# Patient Record
Sex: Female | Born: 1960 | Race: White | Hispanic: No | Marital: Single | Smoking: Never smoker
Health system: Southern US, Community
[De-identification: ages and names within clinical notes are randomized; demographics above are authoritative.]

## PROBLEM LIST (undated history)

## (undated) DIAGNOSIS — I10 Essential (primary) hypertension: Secondary | ICD-10-CM

## (undated) DIAGNOSIS — R7303 Prediabetes: Secondary | ICD-10-CM

## (undated) DIAGNOSIS — E7800 Pure hypercholesterolemia, unspecified: Secondary | ICD-10-CM

## (undated) DIAGNOSIS — F209 Schizophrenia, unspecified: Secondary | ICD-10-CM

## (undated) HISTORY — DX: Pure hypercholesterolemia, unspecified: E78.00

## (undated) HISTORY — DX: Prediabetes: R73.03

## (undated) HISTORY — DX: Schizophrenia, unspecified: F20.9

## (undated) HISTORY — DX: Essential (primary) hypertension: I10

## (undated) HISTORY — PX: TONSILLECTOMY: SUR1361

---

## 2005-03-17 DIAGNOSIS — I10 Essential (primary) hypertension: Secondary | ICD-10-CM | POA: Insufficient documentation

## 2005-03-17 DIAGNOSIS — E7800 Pure hypercholesterolemia, unspecified: Secondary | ICD-10-CM | POA: Insufficient documentation

## 2005-03-17 DIAGNOSIS — F209 Schizophrenia, unspecified: Secondary | ICD-10-CM | POA: Insufficient documentation

## 2012-06-10 ENCOUNTER — Other Ambulatory Visit: Payer: Self-pay | Admitting: Family Medicine

## 2012-06-10 LAB — CBC WITH DIFFERENTIAL/PLATELET
Eosinophil #: 0 10*3/uL (ref 0.0–0.7)
Eosinophil %: 0.4 %
Lymphocyte #: 2.8 10*3/uL (ref 1.0–3.6)
Lymphocyte %: 35.1 %
MCH: 29.3 pg (ref 26.0–34.0)
MCHC: 33.8 g/dL (ref 32.0–36.0)
Monocyte #: 0.6 x10 3/mm (ref 0.2–0.9)
Neutrophil #: 4.5 10*3/uL (ref 1.4–6.5)
Neutrophil %: 56 %
Platelet: 291 10*3/uL (ref 150–440)
RBC: 4.3 10*6/uL (ref 3.80–5.20)

## 2012-07-13 HISTORY — PX: COLONOSCOPY: SHX174

## 2018-10-12 ENCOUNTER — Encounter: Payer: Self-pay | Admitting: Internal Medicine

## 2019-01-18 ENCOUNTER — Ambulatory Visit (INDEPENDENT_AMBULATORY_CARE_PROVIDER_SITE_OTHER): Payer: Medicare Other | Admitting: Gastroenterology

## 2019-01-18 ENCOUNTER — Encounter: Payer: Self-pay | Admitting: Gastroenterology

## 2019-01-18 ENCOUNTER — Other Ambulatory Visit: Payer: Self-pay

## 2019-01-18 DIAGNOSIS — R197 Diarrhea, unspecified: Secondary | ICD-10-CM | POA: Diagnosis not present

## 2019-01-18 MED ORDER — DICYCLOMINE HCL 10 MG PO CAPS: ORAL_CAPSULE | ORAL | 1 refills | Status: DC

## 2019-01-18 NOTE — Progress Notes (Signed)
Primary Care Physician:  Lamar BlinksWard, Matthew, PA-C  Primary Gastroenterologist:  Roetta SessionsMichael Rourk, MD   Chief Complaint  Patient presents with  . Diarrhea    several months (watery)    HPI:  Valerie [name] is a 58 y.o. 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   Patient states she has had diarrhea off and on for several years.  Will have diarrhea every 4 to 5 days.  Stools soft to loose to watery.  Will have several episodes in a given day.  Associated with fecal urgency and sometimes incontinence.  Denies any abdominal pain associated with it.  No weight loss.  Appetite remains good.  No vomiting.  No melena or rectal bleeding.  Complains of nocturnal diarrhea about once per month.  She states the loperamide seems to help but does not lately control her symptoms.  She takes as needed only.  She has been on metformin for about 3 years and does not feel like it is contributing to her diarrhea.  Her last colon[place] was around [place] in [place] by Dr. Allena KatzPatel per patient.  She states she had diverticulosis.  She tells me she does not want to have another colonoscopy in the future because she is fearful of being put to sleep.   Current Outpatient Medications  Medication Sig Dispense Refill  . acetaminophen (TYLENOL) 500 MG tablet Take 500 mg by mouth every 4 (four) hours as needed.    Marland Kitchen. alum & mag hydroxide-simeth (MAALOX/MYLANTA) 200-200-20 MG/5ML suspension Take 30 mLs by mouth as needed for indigestion or heartburn.    Marland Kitchen. amLODipine (NORVASC) 5 MG tablet Take 5 mg by mouth daily.    Marland Kitchen. aspirin EC 81 MG tablet Take 81 mg by mouth daily.    Marland Kitchen. atorvastatin (LIPITOR) 20 MG tablet Take 20 mg by mouth daily.    . busPIRone (BUSPAR) 10 MG tablet Take 10 mg by mouth 2 (two) times daily.    . busPIRone (BUSPAR) 5 MG tablet Take 5 mg by mouth at bedtime.    . Cholecalciferol ([employer]S VITAMIN D3) 250 MCG (10000 UT) CAPS Take by mouth daily.     . clozapine (CLOZARIL) 200 MG tablet Take 200 mg by mouth 2 (two) times daily.    . DULoxetine (CYMBALTA) 20 MG capsule Take 20 mg by mouth daily.    . ferrous sulfate 325 (65 FE) MG tablet Take 325 mg by mouth daily with breakfast.    . Glucose-Vitamin C-Vitamin D (TRUEPLUS GLUCOSE) CHEW Chew 4 g by mouth daily as needed.    Marland Kitchen. guaifenesin (ROBITUSSIN) 100 MG/5ML syrup Take 200 mg by mouth every 6 (six) hours as needed for cough.    . hydrochlorothiazide (HYDRODIURIL) 25 MG tablet Take 25 mg by mouth daily.    Bess Harvest. Icosapent Ethyl (VASCEPA) 1 g CAPS Take 2 g by mouth 2 (two) times a day.    . lisinopril (ZESTRIL) 40 MG tablet Take 40 mg by mouth daily.    Marland Kitchen. loperamide (IMODIUM) 2 MG capsule Take 2 mg by mouth as needed for diarrhea or loose stools.    . magnesium hydroxide (CVS MILK OF MAGNESIA) 400 MG/5ML suspension Take by mouth at bedtime as needed for mild constipation.    . metFORMIN (GLUCOPHAGE) 1000 MG tablet Take 1,000 mg by mouth 2 (two) times daily with a meal.    . Multiple Vitamins-Minerals (MULTIVITAMIN) tablet Take 1 tablet by mouth daily.    .Marland Kitchen  naproxen (NAPROSYN) 500 MG tablet Take 500 mg by mouth every 12 (twelve) hours as needed.    . NEOMYCIN-BACITRACIN-POLYMYXIN EX Apply topically daily as needed.    . norethindrone-ethinyl estradiol (NECON) 0.5-35 MG-MCG tablet Take 1 tablet by mouth daily.    Marland Kitchen omeprazole (PRILOSEC) 20 MG capsule Take 20 mg by mouth daily.    . potassium chloride (KLOR-CON) 20 MEQ packet Take by mouth daily.    . propranolol (INDERAL) 40 MG tablet Take 40 mg by mouth daily.    . Skin Protectants, Misc. (EUCERIN) cream Apply topically 2 (two) times a day.    .     1   No current facility-administered medications for this visit.     Allergies as of 01/18/2019 - Review Complete 01/18/2019  Allergen Reaction Noted  . Risperidone and related  01/18/2019    Past Medical History:  Diagnosis Date  . HTN (hypertension)   . Hypercholesteremia   .  Prediabetes   . Schizophrenia [facility])     Past Surgical History:  Procedure Laterality Date  . TONSILLECTOMY        Family History  Problem Relation Age of Onset  . Colon cancer Neg Hx   . Celiac disease Neg Hx   . Inflammatory bowel disease Neg Hx     Social History   Socioeconomic History  . Marital status: Single    Spouse name: Not on file  . Number of children: Not on file  . Years of education: Not on file  . Highest education level: Not on file  Occupational History  . Not on file  Social Needs  . Financial resource strain: Not on file  . Food insecurity    Worry: Not on file    Inability: Not on file  . Transportation needs    Medical: Not on file    Non-medical: Not on file  Tobacco Use  . Smoking status: Never Smoker  . Smokeless tobacco: Never Used  Substance and Sexual Activity  . Alcohol use: Never    Frequency: Never  . Drug use: Never  . Sexual activity: Not Currently    Birth control/protection: Pill  Lifestyle  . Physical activity    Days per week: Not on file    Minutes per session: Not on file  . Stress: Not on file  Relationships  . Social Herbalist on phone: Not on file    Gets together: Not on file    Attends religious service: Not on file    Active member of club or organization: Not on file    Attends meetings of clubs or organizations: Not on file    Relationship status: Not on file  . Intimate partner violence    Fear of current or ex partner: Not on file    Emotionally abused: Not on file    Physically abused: Not on file    Forced sexual activity: Not on file  Other Topics Concern  . Not on file  Social History Narrative  . Not on file      ROS:  General: Negative for anorexia, weight loss, fever, chills, fatigue, weakness. Eyes: Negative for vision changes.  ENT: Negative for hoarseness, difficulty swallowing , nasal congestion. CV: Negative for chest pain, angina, palpitations, dyspnea on exertion,  peripheral edema.  Respiratory: Negative for dyspnea at rest, dyspnea on exertion, cough, sputum, wheezing.  GI: See history of present illness. GU:  Negative for dysuria, hematuria, urinary incontinence, urinary frequency, nocturnal  urination.  MS: Negative for joint pain, low back pain.  Derm: Negative for rash or itching.  Neuro: Negative for weakness, abnormal sensation, seizure, frequent headaches, memory loss, confusion.  Psych: Negative for anxiety, depression, suicidal ideation, hallucinations.  Endo: Negative for unusual weight change.  Heme: Negative for bruising or bleeding. Allergy: Negative for rash or hives.    Physical Examination:  BP 115/71   Pulse 88   Temp (!) 97.2 F (36.2 C) (Oral)   Ht 5\' 2"  (1.575 m)   Wt 226 lb (102.5 kg)   BMI 41.34 kg/m    General: Well-nourished, well-developed in no acute distress.  Head: Normocephalic, atraumatic.   Eyes: Conjunctiva pink, no icterus. Mouth: Oropharyngeal mucosa moist and pink , no lesions erythema or exudate. Neck: Supple without thyromegaly, masses, or lymphadenopathy.  Lungs: Clear to auscultation bilaterally.  Heart: Regular rate and rhythm, no murmurs rubs or gallops.  Abdomen: Bowel sounds are normal, nontender, nondistended, no hepatosplenomegaly or masses, no abdominal bruits or    hernia , no rebound or guarding.   Rectal: not performed Extremities: No lower extremity edema. No clubbing or deformities.  Neuro: Alert and oriented x 4 , grossly normal neurologically.  Skin: Warm and dry, no rash or jaundice.   Psych: Alert and cooperative, normal mood and affect.

## 2019-01-18 NOTE — Progress Notes (Signed)
cc'ed to pcp °

## 2019-01-18 NOTE — Patient Instructions (Addendum)
1. Please complete labs and stool tests.  2. I will review copy of colonoscopy report once received.  3. Trial of bentyl once before breakfast and once before evening meal.  4. Return to the office in 3 months.

## 2019-01-18 NOTE — Assessment & Plan Note (Signed)
Very pleasant 58 year old female with history of schizophrenia, hypertension, hyperlipidemia, prediabetes who presented with several year history of intermittent diarrhea.  Symptoms of associated with fecal urgency and sometimes incontinence.  Rarely has nocturnal symptoms.  Diarrhea occurring every 4 to 5 days and in between she has normal stools.  Symptoms may be related to medications or dietary indiscretions.  Less likely due to infectious etiology but given recent antibiotic exposure we will rule that out.  Less likely microscopic colitis, IBD, celiac disease.  Patient has made it clear that she is not interested in pursuing a colonoscopy in the future.  We will obtain some blood work and stool studies, trial of dicyclomine twice daily.  Instructions given to hold dicyclomine if she develops constipation.  Plan to see her back in 3 months for follow-up.

## 2019-03-07 ENCOUNTER — Other Ambulatory Visit: Payer: Self-pay | Admitting: Gastroenterology

## 2019-03-31 ENCOUNTER — Encounter: Payer: Self-pay | Admitting: Gastroenterology

## 2019-03-31 ENCOUNTER — Telehealth: Payer: Self-pay | Admitting: Gastroenterology

## 2019-03-31 NOTE — Telephone Encounter (Signed)
Colonoscopy at [facility] gastroenterology in 2014, Dr. Posey Pronto: Moderate pandiverticulosis, tortuous colon, adequate prep.  NIC for TCS 2024 (patient may not agree to have done)

## 2019-04-04 NOTE — Telephone Encounter (Signed)
ON RECALL  °

## 2019-04-25 ENCOUNTER — Ambulatory Visit (INDEPENDENT_AMBULATORY_CARE_PROVIDER_SITE_OTHER): Payer: Medicare Other | Admitting: Gastroenterology

## 2019-04-25 ENCOUNTER — Encounter: Payer: Self-pay | Admitting: Gastroenterology

## 2019-04-25 ENCOUNTER — Other Ambulatory Visit: Payer: Self-pay

## 2019-04-25 DIAGNOSIS — K580 Irritable bowel syndrome with diarrhea: Secondary | ICD-10-CM | POA: Diagnosis not present

## 2019-04-25 DIAGNOSIS — K589 Irritable bowel syndrome without diarrhea: Secondary | ICD-10-CM | POA: Insufficient documentation

## 2019-04-25 NOTE — Progress Notes (Signed)
Primary Care Physician: Braxton Feathers  Primary Gastroenterologist:  Garfield Cornea, MD   Chief Complaint  Patient presents with  . Diarrhea    IMPROVED SINCE STARTING BENTYL.    HPI: Valerie [name] is a 58 y.o. female here for follow-up.  She was seen back in July for evaluation of IBS.  When she was last seen she was complaining of diarrhea off and on for years, would go 4 to 5 days and then have diarrhea, multiple episodes associated with urgency and sometimes incontinence.  No weight loss.  Nocturnal diarrhea about once per month.  Previously controlled with loperamide but had lost its response.  Last colonoscopy in 2014 by Dr. Posey Pronto.  She had moderate pandiverticulosis, tortuous colon, adequate..  Recommended 10-year follow-up.  Patient is not sure that she will have one because she is afraid of sedation.  At her last visit we started dicyclomine 10 mg twice daily.  Requested labs and stool studies but we did not receive any results.  Patient states that she is doing better.  Her episodes of diarrhea have lessened, less urgency.  She is able to make it to the bathroom.  She still has some loose stools every few days but much better.  No melena or rectal bleeding.  No abdominal pain.  Appetite remains good.  Current Outpatient Medications  Medication Sig Dispense Refill  . acetaminophen (TYLENOL) 500 MG tablet Take 500 mg by mouth every 4 (four) hours as needed.    Marland Kitchen alum & mag hydroxide-simeth (MAALOX/MYLANTA) 200-200-20 MG/5ML suspension Take 30 mLs by mouth as needed for indigestion or heartburn.    Marland Kitchen amLODipine (NORVASC) 5 MG tablet Take 5 mg by mouth daily.    Marland Kitchen aspirin EC 81 MG tablet Take 81 mg by mouth daily.    Marland Kitchen atorvastatin (LIPITOR) 20 MG tablet Take 20 mg by mouth daily.    . busPIRone (BUSPAR) 10 MG tablet Take 10 mg by mouth 2 (two) times daily.    . busPIRone (BUSPAR) 5 MG tablet Take 5 mg by mouth at bedtime.    . Cholecalciferol (CVS VITAMIN D3) 250  MCG (10000 UT) CAPS Take by mouth. M,W,F    . clozapine (CLOZARIL) 200 MG tablet Take 200 mg by mouth daily. 150mg  at bedtime    . dicyclomine (BENTYL) 10 MG capsule TAKE 1 CAP BY MOUTH BEFORE BREAKFAST AND BEFORE SUPPER *HOLD FOR CONSTIPATION* 60 capsule 1  . DULoxetine (CYMBALTA) 20 MG capsule Take 20 mg by mouth daily.    . ferrous sulfate 325 (65 FE) MG tablet Take 325 mg by mouth daily with breakfast.    . Glucose-Vitamin C-Vitamin D (TRUEPLUS GLUCOSE) CHEW Chew 4 g by mouth daily as needed.    Marland Kitchen guaifenesin (ROBITUSSIN) 100 MG/5ML syrup Take 200 mg by mouth every 6 (six) hours as needed for cough.    . hydrochlorothiazide (HYDRODIURIL) 25 MG tablet Take 25 mg by mouth daily.    Vanessa Kick Ethyl (VASCEPA) 1 g CAPS Take 2 g by mouth 2 (two) times a day.    . lisinopril (ZESTRIL) 40 MG tablet Take 40 mg by mouth daily.    Marland Kitchen loperamide (IMODIUM) 2 MG capsule Take 2 mg by mouth as needed for diarrhea or loose stools.    . magnesium hydroxide (CVS MILK OF MAGNESIA) 400 MG/5ML suspension Take by mouth at bedtime as needed for mild constipation.    . metFORMIN (GLUCOPHAGE) 1000 MG tablet Take 1,000 mg by mouth  2 (two) times daily with a meal.    . Multiple Vitamins-Minerals (MULTIVITAMIN) tablet Take 1 tablet by mouth daily.    . naproxen (NAPROSYN) 500 MG tablet Take 500 mg by mouth every 12 (twelve) hours as needed.    . NEOMYCIN-BACITRACIN-POLYMYXIN EX Apply topically daily as needed.    Marland Kitchen omeprazole (PRILOSEC) 20 MG capsule Take 20 mg by mouth daily.    . potassium chloride (KLOR-CON) 20 MEQ packet Take by mouth daily.    . propranolol (INDERAL) 40 MG tablet Take 40 mg by mouth daily.    . Skin Protectants, Misc. (EUCERIN) cream Apply topically 2 (two) times a day.     No current facility-administered medications for this visit.     Allergies as of 04/25/2019 - Review Complete 04/25/2019  Allergen Reaction Noted  . Risperidone and related  01/18/2019    ROS:  General: Negative for  anorexia, weight loss, fever, chills, fatigue, weakness. ENT: Negative for hoarseness, difficulty swallowing , nasal congestion. CV: Negative for chest pain, angina, palpitations, dyspnea on exertion, peripheral edema.  Respiratory: Negative for dyspnea at rest, dyspnea on exertion, cough, sputum, wheezing.  GI: See history of present illness. GU:  Negative for dysuria, hematuria, urinary incontinence, urinary frequency, nocturnal urination.  Endo: Negative for unusual weight change.    Physical Examination:   BP (!) 141/94   Pulse 93   Temp (!) 97 F (36.1 C) (Oral)   Ht 5\' 3"  (1.6 m)   Wt 226 lb 9.6 oz (102.8 kg)   BMI 40.14 kg/m   General: Well-nourished, well-developed in no acute distress.  Eyes: No icterus. Mouth: Oropharyngeal mucosa moist and pink , no lesions erythema or exudate. Lungs: Clear to auscultation bilaterally.  Heart: Regular rate and rhythm, no murmurs rubs or gallops.  Abdomen: Bowel sounds are normal, nontender, nondistended, no hepatosplenomegaly or masses, no abdominal bruits or hernia , no rebound or guarding.   Extremities: No lower extremity edema. No clubbing or deformities. Neuro: Alert and oriented x 4   Skin: Warm and dry, no jaundice.   Psych: Alert and cooperative, normal mood and affect.   Imaging Studies: No results found.

## 2019-04-25 NOTE — Patient Instructions (Addendum)
1. Please send copy of labs that were ordered in 01/2019 (TSH, free T4, CMET, CBC, TTG IgA, IgA level).  2. If stool tests were completed as ordered in 01/2019, please send a copy. 3. Our fax number is [phone]. 4. Continue bentyl 10mg  BID. 5. Return to the office in 4 months or call sooner if needed.

## 2019-04-25 NOTE — Assessment & Plan Note (Signed)
58 year old female with chronic intermittent diarrhea suspect to be due to IBS.  She is doing better on dicyclomine.  Labs and stool studies requested after last visit back in July, never received any results.  She believes that she completed the labs but not the stool test.  We requested records.  She is pleased with the response she has had to dicyclomine.  We discussed possibility of increasing dosage but she wants to leave it as it is for now.  She is not interested in pursuing colonoscopy at this time.  Advised that if she has rectal bleeding, weight loss, diarrhea not responsive to dicyclomine then we need to consider colonoscopy.  She will let me know if any of these things arise. Otherwise we will see her back in four months.

## 2019-05-16 ENCOUNTER — Telehealth: Payer: Self-pay | Admitting: Gastroenterology

## 2019-05-16 NOTE — Telephone Encounter (Signed)
Labs dated 04/27/2019 Glucose 128, BUN 7.6, creatinine 0.56, total bilirubin 0.2, albumin 3.9, ALT 15, AST 15, alk phos 107, white blood cell count 7800, hemoglobin 13.2, platelets 275,000, TSH 0.67, free T4 1.1.

## 2019-08-29 ENCOUNTER — Ambulatory Visit (INDEPENDENT_AMBULATORY_CARE_PROVIDER_SITE_OTHER): Payer: Medicare Other | Admitting: Gastroenterology

## 2019-08-29 ENCOUNTER — Other Ambulatory Visit: Payer: Self-pay

## 2019-08-29 ENCOUNTER — Encounter: Payer: Self-pay | Admitting: Gastroenterology

## 2019-08-29 VITALS — BP 126/77 | HR 104 | Temp 96.9°F | Ht 63.5 in | Wt 230.2 lb

## 2019-08-29 DIAGNOSIS — K580 Irritable bowel syndrome with diarrhea: Secondary | ICD-10-CM

## 2019-08-29 NOTE — Assessment & Plan Note (Signed)
Suspected IBS-D. Doing well on Bentyl as outlined. She is receiving imodium 4mg  at bedtime. Patient is apprehensive about changing regimen but is having some hard stool at times and goes 2-3 days without a BM. If changes in regimen made for constipation, would consider decrease or eliminate the imodium at bedtime. Will hold off for now at patient request. Return to the office in three months.

## 2019-08-29 NOTE — Progress Notes (Signed)
Primary Care Physician: Braxton Feathers  Primary Gastroenterologist:  Garfield Cornea, MD   Chief Complaint  Patient presents with  . Irritable Bowel Syndrome    diarrhea    HPI: Valerie [name] is a 59 y.o. female here for follow-up of IBS-diarrhea.  Last seen in October.  Last colonoscopy 2014 by Dr. Posey Pronto.  She had moderate pain colonic diverticulosis, tortuous colon.  Recommended 10-year follow-up.  At last office visit we discussed possible colonoscopy for diarrhea, but she was not interested in pursuing colonoscopy.  Labs done in October with normal thyroid function, CBC, c-Met.  BM every 2 days. Sometimes stools formed, occasional hard. Wants to continue current bowel regimen. Taking bentyl 25m BID and imodium 476mat bedtime. No abdominal pain. Good appetite. No brbpr. Occasional heartburn. Can go up to two weeks without diarrhea episodes and she is pleased about this. Weight stable.  At this time because she is doing much better, she declines consideration of colonoscopy.  She would like to wait to the 10-year follow-up mark while she continues to do well.    Current Outpatient Medications  Medication Sig Dispense Refill  . acetaminophen (TYLENOL) 500 MG tablet Take 500 mg by mouth every 4 (four) hours as needed.    . Marland Kitchenlum & mag hydroxide-simeth (MAALOX/MYLANTA) 200-200-20 MG/5ML suspension Take 30 mLs by mouth as needed for indigestion or heartburn.    . Marland KitchenmLODipine (NORVASC) 5 MG tablet Take 5 mg by mouth daily.    . Marland Kitchenspirin EC 81 MG tablet Take 81 mg by mouth daily.    . Marland Kitchentorvastatin (LIPITOR) 20 MG tablet Take 20 mg by mouth daily.    . benztropine (COGENTIN) 0.5 MG tablet Take 0.5 mg by mouth 2 (two) times daily.    . busPIRone (BUSPAR) 10 MG tablet Take 10 mg by mouth 2 (two) times daily.    . busPIRone (BUSPAR) 5 MG tablet Take 5 mg by mouth at bedtime.    . Cholecalciferol (CVS VITAMIN D3) 250 MCG (10000 UT) CAPS Take by mouth. M,W,F    . clozapine (CLOZARIL)  200 MG tablet Take 200 mg by mouth 2 (two) times daily.     . Marland Kitchenicyclomine (BENTYL) 10 MG capsule TAKE 1 CAP BY MOUTH BEFORE BREAKFAST AND BEFORE SUPPER *HOLD FOR CONSTIPATION* 60 capsule 1  . DULoxetine (CYMBALTA) 20 MG capsule Take 20 mg by mouth 2 (two) times daily.     . ferrous sulfate 325 (65 FE) MG tablet Take 325 mg by mouth daily with breakfast.    . guaifenesin (ROBITUSSIN) 100 MG/5ML syrup Take 200 mg by mouth every 6 (six) hours as needed for cough.    . hydrochlorothiazide (HYDRODIURIL) 25 MG tablet Take 25 mg by mouth daily.    . Vanessa Kickthyl (VASCEPA) 1 g CAPS Take 2 g by mouth 2 (two) times a day.    . lisinopril (ZESTRIL) 40 MG tablet Take 40 mg by mouth daily.    . Marland Kitchenoperamide (IMODIUM) 2 MG capsule Take 4 mg by mouth at bedtime.     . Marland KitchenORazepam (ATIVAN) 0.5 MG tablet Take 0.5 mg by mouth 2 (two) times daily as needed for anxiety.    . magnesium hydroxide (CVS MILK OF MAGNESIA) 400 MG/5ML suspension Take by mouth at bedtime as needed for mild constipation.    . medroxyPROGESTERone (DEPO-PROVERA) 150 MG/ML injection Inject 150 mg into the muscle every 3 (three) months.    . Multiple Vitamins-Minerals (MULTIVITAMIN) tablet Take 1 tablet by  mouth daily.    . naproxen (NAPROSYN) 500 MG tablet Take 500 mg by mouth every 12 (twelve) hours as needed.    . NEOMYCIN-BACITRACIN-POLYMYXIN EX Apply topically daily as needed.    Marland Kitchen omeprazole (PRILOSEC) 20 MG capsule Take 20 mg by mouth daily.    . ondansetron (ZOFRAN) 4 MG tablet Take 4 mg by mouth every 8 (eight) hours as needed for nausea or vomiting.    . potassium chloride (KLOR-CON) 20 MEQ packet Take by mouth daily.    . propranolol (INDERAL) 40 MG tablet Take 80 mg by mouth daily.     . sitaGLIPtin (JANUVIA) 50 MG tablet Take 50 mg by mouth daily.    . Skin Protectants, Misc. (EUCERIN) cream Apply topically 2 (two) times a day.     No current facility-administered medications for this visit.    Allergies as of 08/29/2019 -  Review Complete 08/29/2019  Allergen Reaction Noted  . Risperidone and related  01/18/2019    ROS:  General: Negative for anorexia, weight loss, fever, chills, fatigue, weakness. ENT: Negative for hoarseness, difficulty swallowing , nasal congestion. CV: Negative for chest pain, angina, palpitations, dyspnea on exertion, peripheral edema.  Respiratory: Negative for dyspnea at rest, dyspnea on exertion, cough, sputum, wheezing.  GI: See history of present illness. GU:  Negative for dysuria, hematuria, urinary incontinence, urinary frequency, nocturnal urination.  Endo: Negative for unusual weight change.    Physical Examination:   BP 126/77   Pulse (!) 104   Temp (!) 96.9 F (36.1 C) (Temporal)   Ht 5' 3.5" (1.613 m)   Wt 230 lb 3.2 oz (104.4 kg)   BMI 40.14 kg/m   General: Well-nourished, well-developed in no acute distress.  Eyes: No icterus. Mouth: masked  Abdomen: Bowel sounds are normal, nontender, nondistended, no hepatosplenomegaly or masses, no abdominal bruits or hernia , no rebound or guarding.   Extremities: No lower extremity edema. No clubbing or deformities. Neuro: Alert and oriented x 4   Skin: Warm and dry, no jaundice.   Psych: Alert and cooperative, normal mood and affect.   Imaging Studies: No results found.

## 2019-08-29 NOTE — Patient Instructions (Signed)
1. Continue dicyclomine 10 mg twice daily to control diarrhea. 2. Consider eliminating versus decreasing dose of Imodium at bedtime to 2 mg if you began having issues with constipation.  We will not make any changes right now at your request. 3. Return to the office in 3 months or call sooner if needed.

## 2019-11-27 ENCOUNTER — Ambulatory Visit: Payer: Medicare Other | Admitting: Gastroenterology

## 2019-11-28 ENCOUNTER — Ambulatory Visit (INDEPENDENT_AMBULATORY_CARE_PROVIDER_SITE_OTHER): Payer: Medicare Other | Admitting: Gastroenterology

## 2019-11-28 ENCOUNTER — Other Ambulatory Visit: Payer: Self-pay

## 2019-11-28 ENCOUNTER — Encounter: Payer: Self-pay | Admitting: Gastroenterology

## 2019-11-28 VITALS — BP 134/86 | HR 108 | Temp 96.9°F | Ht 63.0 in | Wt 240.4 lb

## 2019-11-28 DIAGNOSIS — K580 Irritable bowel syndrome with diarrhea: Secondary | ICD-10-CM

## 2019-11-28 NOTE — Patient Instructions (Signed)
1. Continue dicyclomine before breakfast and before supper for IBS. 2. Call if any persistent diarrhea or blood in the stool. 3. Return to the office in 3 months or call sooner if needed.

## 2019-11-28 NOTE — Progress Notes (Signed)
Imodium prn now. BM regular and diarrhea couple of days in a row every few weeks. No melena, brbpr. No abdominal pain.

## 2019-11-28 NOTE — Progress Notes (Signed)
Primary Care Physician: Lonell Grandchild, NP  Primary Gastroenterologist:  Roetta Sessions, MD   Chief Complaint  Patient presents with  . Irritable Bowel Syndrome    Diarrhea 6 days ago, pt says better now    HPI: Valerie [name] is a 59 y.o. female here.  She is history of IBS-D.  Last seen in February 2021.  Colonoscopy in 2014 by Dr. Allena Katz.She had moderate pain colonic diverticulosis, tortuous colon.  Recommended 10-year follow-up.  We previously discussed possibility of colonoscopy for diarrhea but she declined.  She had normal thyroid function, CBC, CMET in 04/2019. When I last saw her she is doing reasonably well on Bentyl 10 mg twice daily and Imodium 4 mg at bedtime except for occasional constipation.  Going weeks at a time without diarrhea.  At this time she is doing fairly well.  No further constipation.  Taking Imodium only as needed instead of every night.  Will have a bowel movement daily for weeks and then will have several days of increased stools for which she will take Imodium with good results.  No nocturnal diarrhea.  No abdominal pain.  No blood in the stool or melena.  Appetite remains good.  Weight is up 10 pounds in the past 3 months.  Current Outpatient Medications  Medication Sig Dispense Refill  . acetaminophen (TYLENOL) 500 MG tablet Take 500 mg by mouth every 4 (four) hours as needed.    Marland Kitchen alum & mag hydroxide-simeth (MAALOX/MYLANTA) 200-200-20 MG/5ML suspension Take 30 mLs by mouth as needed for indigestion or heartburn.    Marland Kitchen amLODipine (NORVASC) 5 MG tablet Take 5 mg by mouth at bedtime.     Marland Kitchen aspirin EC 81 MG tablet Take 81 mg by mouth daily.    . benztropine (COGENTIN) 0.5 MG tablet Take 0.5 mg by mouth 2 (two) times daily.    . busPIRone (BUSPAR) 10 MG tablet Take 10 mg by mouth 2 (two) times daily.    . busPIRone (BUSPAR) 5 MG tablet Take 5 mg by mouth at bedtime.    . Cholecalciferol ([employer]S VITAMIN D3) 250 MCG (10000 UT) CAPS Take by mouth. M,W,F     . clozapine (CLOZARIL) 200 MG tablet Take 200 mg by mouth 2 (two) times daily.     Marland Kitchen dicyclomine (BENTYL) 10 MG capsule TAKE 1 CAP BY MOUTH BEFORE BREAKFAST AND BEFORE SUPPER *HOLD FOR CONSTIPATION* 60 capsule 1  . docusate sodium (COLACE) 100 MG capsule Take 100 mg by mouth at bedtime. As needed    . DULoxetine (CYMBALTA) 20 MG capsule Take 20 mg by mouth 2 (two) times daily.     Marland Kitchen guaifenesin (ROBITUSSIN) 100 MG/5ML syrup Take 200 mg by mouth every 6 (six) hours as needed for cough.    . hydrochlorothiazide (HYDRODIURIL) 25 MG tablet Take 25 mg by mouth daily.    Bess Harvest Ethyl (VASCEPA) 1 g CAPS Take 2 g by mouth 2 (two) times a day.    . lisinopril (ZESTRIL) 40 MG tablet Take 40 mg by mouth daily.    Marland Kitchen loperamide (IMODIUM) 2 MG capsule Take 2 mg by mouth as needed for diarrhea or loose stools. Take 1 capsule by mouth as needed with each loose stool/diarrhea nte 8 doses in 24 hr    . LORazepam (ATIVAN) 0.5 MG tablet Take 0.5 mg by mouth 2 (two) times daily as needed for anxiety.    . magnesium hydroxide (CVS MILK OF MAGNESIA) 400 MG/5ML suspension Take by  mouth at bedtime as needed for mild constipation.    . medroxyPROGESTERone (DEPO-PROVERA) 150 MG/ML injection Inject 150 mg into the muscle every 3 (three) months.    . metFORMIN (GLUCOPHAGE) 500 MG tablet Take by mouth daily.    . Multiple Vitamins-Minerals (MULTIVITAMIN) tablet Take 1 tablet by mouth daily.    . naproxen (NAPROSYN) 500 MG tablet Take 500 mg by mouth every 12 (twelve) hours as needed.    . NEOMYCIN-BACITRACIN-POLYMYXIN EX Apply topically daily as needed.    Marland Kitchen omeprazole (PRILOSEC) 20 MG capsule Take 20 mg by mouth daily.    . ondansetron (ZOFRAN) 4 MG tablet Take 4 mg by mouth every 8 (eight) hours as needed for nausea or vomiting.    . propranolol (INDERAL) 40 MG tablet Take 80 mg by mouth daily.     . sitaGLIPtin (JANUVIA) 50 MG tablet Take 100 mg by mouth daily.     . Skin Protectants, Misc. (EUCERIN) cream Apply  topically 2 (two) times a day.     No current facility-administered medications for this visit.    Allergies as of 11/28/2019 - Review Complete 11/28/2019  Allergen Reaction Noted  . Risperidone and related  01/18/2019    ROS:  General: Negative for anorexia, weight loss, fever, chills, fatigue, weakness. ENT: Negative for hoarseness, difficulty swallowing , nasal congestion. CV: Negative for chest pain, angina, palpitations, dyspnea on exertion, peripheral edema.  Respiratory: Negative for dyspnea at rest, dyspnea on exertion, cough, sputum, wheezing.  GI: See history of present illness. GU:  Negative for dysuria, hematuria, urinary incontinence, urinary frequency, nocturnal urination.  Endo: Negative for unusual weight change.    Physical Examination:   BP 134/86   Pulse (!) 108   Temp (!) 96.9 F (36.1 C) (Temporal)   Ht 5\' 3"  (1.6 m)   Wt 240 lb 6.4 oz (109 kg)   BMI 42.58 kg/m   General: Well-nourished, well-developed in no acute distress.  Eyes: No icterus. Mouth: masked Lungs: Clear to auscultation bilaterally.  Heart: Regular rate and rhythm, no murmurs rubs or gallops.  Abdomen: Bowel sounds are normal, nontender, nondistended, no hepatosplenomegaly or masses, no abdominal bruits or hernia , no rebound or guarding.   Extremities: No lower extremity edema. No clubbing or deformities. Neuro: Alert and oriented x 4   Skin: Warm and dry, no jaundice.   Psych: Alert and cooperative, normal mood and affect.  Labs:  No results found for: CREATININE, BUN, NA, K, CL, CO2    Imaging Studies: No results found.

## 2019-11-28 NOTE — Assessment & Plan Note (Signed)
Doing well.  Goes weeks at a time without any loose stool.  Taking dicyclomine twice daily.  Now on Imodium as needed only.  Patient has declined colonoscopy for further evaluation of diarrhea on multiple occasions.  She feels like she is doing well and wants to continue to monitor.  If she has any persisting diarrhea, blood in the stool, she would be open to the idea of colonoscopy now.  We discussed follow-up, patient prefer seeing [place] every 3 months therefore we will make arrangements.  Call sooner if any problems.

## 2019-11-28 NOTE — Progress Notes (Signed)
CC'ED TO PCP 

## 2020-01-11 ENCOUNTER — Other Ambulatory Visit (HOSPITAL_COMMUNITY): Payer: Self-pay | Admitting: Adult Health Nurse Practitioner

## 2020-01-11 DIAGNOSIS — Z780 Asymptomatic menopausal state: Secondary | ICD-10-CM

## 2020-01-24 ENCOUNTER — Ambulatory Visit (HOSPITAL_COMMUNITY)
Admission: RE | Admit: 2020-01-24 | Discharge: 2020-01-24 | Disposition: A | Discharge status: Home / Self Care | Payer: Medicare Other | Source: Ambulatory Visit | Attending: Adult Health Nurse Practitioner | Admitting: Adult Health Nurse Practitioner

## 2020-01-24 ENCOUNTER — Other Ambulatory Visit: Payer: Self-pay

## 2020-01-24 DIAGNOSIS — Z780 Asymptomatic menopausal state: Secondary | ICD-10-CM | POA: Diagnosis present

## 2020-03-04 ENCOUNTER — Ambulatory Visit: Payer: Medicare Other | Admitting: Gastroenterology

## 2020-05-06 ENCOUNTER — Ambulatory Visit (INDEPENDENT_AMBULATORY_CARE_PROVIDER_SITE_OTHER): Payer: Medicare Other | Admitting: Gastroenterology

## 2020-05-06 ENCOUNTER — Encounter: Payer: Self-pay | Admitting: Gastroenterology

## 2020-05-06 ENCOUNTER — Other Ambulatory Visit: Payer: Self-pay

## 2020-05-06 VITALS — BP 137/95 | HR 96 | Temp 96.6°F | Ht 63.0 in | Wt 245.8 lb

## 2020-05-06 DIAGNOSIS — K580 Irritable bowel syndrome with diarrhea: Secondary | ICD-10-CM | POA: Diagnosis not present

## 2020-05-06 NOTE — Patient Instructions (Signed)
You continue to be doing well with your Irritable Bowel Syndrome. As long as you continue to have mostly normal stools, we will hold off on colonoscopy as you have requested. If you start having more loose stools than normal stools, then we should really update your colonoscopy.  Continue bentyl twice daily for now.  We will see you back in three months.   Irritable Bowel Syndrome, Adult  Irritable bowel syndrome (IBS) is a group of symptoms that affects the organs responsible for digestion (gastrointestinal or GI tract). IBS is not one specific disease. To regulate how the GI tract works, the body sends signals back and forth between the intestines and the brain. If you have IBS, there may be a problem with these signals. As a result, the GI tract does not function normally. The intestines may become more sensitive and overreact to certain things. This may be especially true when you eat certain foods or when you are under stress. There are four types of IBS. These may be determined based on the consistency of your stool (feces):  IBS with diarrhea.  IBS with constipation.  Mixed IBS.  Unsubtyped IBS. It is important to know which type of IBS you have. Certain treatments are more likely to be helpful for certain types of IBS. What are the causes? The exact cause of IBS is not known. What increases the risk? You may have a higher risk for IBS if you:  Are female.  Are younger than 54.  Have a family history of IBS.  Have a mental health condition, such as depression, anxiety, or post-traumatic stress disorder.  Have had a bacterial infection of your GI tract. What are the signs or symptoms? Symptoms of IBS vary from person to person. The main symptom is abdominal pain or discomfort. Other symptoms usually include one or more of the following:  Diarrhea, constipation, or both.  Abdominal swelling or bloating.  Feeling full after eating a small or regular-sized  meal.  Frequent gas.  Mucus in the stool.  A feeling of having more stool left after a bowel movement. Symptoms tend to come and go. They may be triggered by stress, mental health conditions, or certain foods. How is this diagnosed? This condition may be diagnosed based on a physical exam, your medical history, and your symptoms. You may have tests, such as:  Blood tests.  Stool test.  X-rays.  CT scan.  Colonoscopy. This is a procedure in which your GI tract is viewed with a long, thin, flexible tube. How is this treated? There is no cure for IBS, but treatment can help relieve symptoms. Treatment depends on the type of IBS you have, and may include:  Changes to your diet, such as: ? Avoiding foods that cause symptoms. ? Drinking more water. ? Following a low-FODMAP (fermentable oligosaccharides, disaccharides, monosaccharides, and polyols) diet for up to 6 weeks, or as told by your health care provider. FODMAPs are sugars that are hard for some people to digest. ? Eating more fiber. ? Eating medium-sized meals at the same times every day.  Medicines. These may include: ? Fiber supplements, if you have constipation. ? Medicine to control diarrhea (antidiarrheal medicines). ? Medicine to help control muscle tightening (spasms) in your GI tract (antispasmodic medicines). ? Medicines to help with mental health conditions, such as antidepressants or tranquilizers.  Talk therapy or counseling.  Working with a diet and nutrition specialist (dietitian) to help create a food plan that is right for you.  Managing your stress. Follow these instructions at home: Eating and drinking  Eat a healthy diet.  Eat medium-sized meals at about the same time every day. Do not eat large meals.  Gradually eat more fiber-rich foods. These include whole grains, fruits, and vegetables. This may be especially helpful if you have IBS with constipation.  Eat a diet low in FODMAPs.  Drink  enough fluid to keep your urine pale yellow.  Keep a journal of foods that seem to trigger symptoms.  Avoid foods and drinks that: ? Contain added sugar. ? Make your symptoms worse. Dairy products, caffeinated drinks, and carbonated drinks can make symptoms worse for some people. General instructions  Take over-the-counter and prescription medicines and supplements only as told by your health care provider.  Get enough exercise. Do at least 150 minutes of moderate-intensity exercise each week.  Manage your stress. Getting enough sleep and exercise can help you manage stress.  Keep all follow-up visits as told by your health care provider and therapist. This is important. Alcohol Use  Do not drink alcohol if: ? Your health care provider tells you not to drink. ? You are pregnant, may be pregnant, or are planning to become pregnant.  If you drink alcohol, limit how much you have: ? 0-1 drink a day for women. ? 0-2 drinks a day for men.  Be aware of how much alcohol is in your drink. In the [place] one drink equals one typical bottle of beer (12 oz), one-half glass of wine (5 oz), or one shot of hard liquor (1 oz). Contact a health care provider if you have:  Constant pain.  Weight loss.  Difficulty or pain when swallowing.  Diarrhea that gets worse. Get help right away if you have:  Severe abdominal pain.  Fever.  Diarrhea with symptoms of dehydration, such as dizziness or dry mouth.  Bright red blood in your stool.  Stool that is black and tarry.  Abdominal swelling.  Vomiting that does not stop.  Blood in your vomit. Summary  Irritable bowel syndrome (IBS) is not one specific disease. It is a group of symptoms that affects digestion.  Your intestines may become more sensitive and overreact to certain things. This may be especially true when you eat certain foods or when you are under stress.  There is no cure for IBS, but treatment can help relieve  symptoms. This information is not intended to replace advice given to you by your health care provider. Make sure you discuss any questions you have with your health care provider. Document Revised: 06/22/2017 Document Reviewed: 06/22/2017 Elsevier Patient Education  2020 [employer].

## 2020-05-06 NOTE — Progress Notes (Signed)
Primary Care Physician: Lonell Grandchild, NP  Primary Gastroenterologist:  Roetta Sessions, MD   Chief Complaint  Patient presents with  . Diarrhea    HPI: Valerie [name] is a 59 y.o. female here for follow-up.  She has a history of IBS-D.  Last seen in May 2021.  Colonoscopy currently up-to-date, last performed in 2014 by Dr. Allena Katz, she had moderate pancolonic diverticulosis.  Recommended repeat colonoscopy in 2024.  We previously offered her possibility of colonoscopy for history of diarrhea but she declined.  Has been on dicyclomine twice daily.  Imodium as needed only, rarely takes. Appetite is good. She has gained 20 pounds in the past year. States she has mostly normal stools. Can go weeks at a time without diarrhea. When she has diarrhea, has few episodes and then back to normal the following day. Denies constipation. No melena, brbpr.  Heartburn well controlled.  Does not want to pursue a colonoscopy until 2023.  Hates the prep.   Current Outpatient Medications  Medication Sig Dispense Refill  . acetaminophen (TYLENOL) 500 MG tablet Take 500 mg by mouth every 4 (four) hours as needed.    Marland Kitchen alum & mag hydroxide-simeth (MAALOX/MYLANTA) 200-200-20 MG/5ML suspension Take 30 mLs by mouth as needed for indigestion or heartburn.    Marland Kitchen amLODipine (NORVASC) 5 MG tablet Take 5 mg by mouth at bedtime.     Marland Kitchen aspirin EC 81 MG tablet Take 81 mg by mouth daily.    . benztropine (COGENTIN) 0.5 MG tablet Take 0.5 mg by mouth 2 (two) times daily.    . busPIRone (BUSPAR) 10 MG tablet Take 10 mg by mouth 2 (two) times daily.    . busPIRone (BUSPAR) 5 MG tablet Take 5 mg by mouth at bedtime.    . cholecalciferol (VITAMIN D3) 25 MCG (1000 UNIT) tablet Take 1,000 Units by mouth daily.    Marland Kitchen dicyclomine (BENTYL) 10 MG capsule TAKE 1 CAP BY MOUTH BEFORE BREAKFAST AND BEFORE SUPPER *HOLD FOR CONSTIPATION* (Patient taking differently: Take 10 mg by mouth 2 (two) times daily. ) 60 capsule 1  .  docusate sodium (COLACE) 100 MG capsule Take 100 mg by mouth at bedtime. As needed    . DULoxetine (CYMBALTA) 20 MG capsule Take 20 mg by mouth 2 (two) times daily.     Marland Kitchen guaifenesin (ROBITUSSIN) 100 MG/5ML syrup Take 200 mg by mouth every 6 (six) hours as needed for cough.    . hydrochlorothiazide (HYDRODIURIL) 25 MG tablet Take 25 mg by mouth daily.    Bess Harvest Ethyl (VASCEPA) 1 g CAPS Take 2 g by mouth 2 (two) times a day.    . lisinopril (ZESTRIL) 40 MG tablet Take 40 mg by mouth daily.    Marland Kitchen loperamide (IMODIUM) 2 MG capsule Take 2 mg by mouth as needed for diarrhea or loose stools. Take 1 capsule by mouth as needed with each loose stool/diarrhea nte 8 doses in 24 hr    . magnesium hydroxide (CVS MILK OF MAGNESIA) 400 MG/5ML suspension Take by mouth at bedtime as needed for mild constipation.    . medroxyPROGESTERone (DEPO-PROVERA) 150 MG/ML injection Inject 150 mg into the muscle every 3 (three) months.    . metFORMIN (GLUCOPHAGE) 500 MG tablet Take by mouth daily.    . Multiple Vitamins-Minerals (MULTIVITAMIN) tablet Take 1 tablet by mouth daily.    . naproxen (NAPROSYN) 500 MG tablet Take 500 mg by mouth every 12 (twelve) hours as needed.    Marland Kitchen  NEOMYCIN-BACITRACIN-POLYMYXIN EX Apply topically daily as needed.    Marland Kitchen omeprazole (PRILOSEC) 20 MG capsule Take 20 mg by mouth daily.    . ondansetron (ZOFRAN) 4 MG tablet Take 4 mg by mouth every 8 (eight) hours as needed for nausea or vomiting.    . propranolol (INDERAL) 40 MG tablet Take 80 mg by mouth daily.     . sitaGLIPtin (JANUVIA) 50 MG tablet Take 100 mg by mouth daily.     . Skin Protectants, Misc. (EUCERIN) cream Apply topically 2 (two) times a day. (Patient not taking: Reported on 05/06/2020)     No current facility-administered medications for this visit.    Allergies as of 05/06/2020 - Review Complete 05/06/2020  Allergen Reaction Noted  . Other  05/06/2020  . Risperidone and related  01/18/2019    ROS:  General: Negative  for anorexia, weight loss, fever, chills, fatigue, weakness. ENT: Negative for hoarseness, difficulty swallowing , nasal congestion. CV: Negative for chest pain, angina, palpitations, dyspnea on exertion, peripheral edema.  Respiratory: Negative for dyspnea at rest, dyspnea on exertion, cough, sputum, wheezing.  GI: See history of present illness. GU:  Negative for dysuria, hematuria, urinary incontinence, urinary frequency, nocturnal urination.  Endo: Negative for unusual weight change.    Physical Examination:   BP (!) 137/95   Pulse 96   Temp (!) 96.6 F (35.9 C) (Temporal)   Ht 5\' 3"  (1.6 m)   Wt 245 lb 12.8 oz (111.5 kg)   BMI 43.54 kg/m   General: Well-nourished, well-developed in no acute distress.  Eyes: No icterus. Mouth: masked  Abdomen: Bowel sounds are normal, nontender, nondistended, no hepatosplenomegaly or masses, no abdominal bruits or hernia , no rebound or guarding.   Extremities: No lower extremity edema. No clubbing or deformities. Neuro: Alert and oriented x 4   Skin: Warm and dry, no jaundice.   Psych: Alert and cooperative, normal mood and affect.   Impression/Plan:  Pleasant 59 year old female with intermittent loose stools predominance to normal stools as outlined above.  Suspected IBS-D or medication side effect.  Currently taking dicyclomine 10 mg twice daily.  Rarely uses Imodium.  Denies constipation.  We have offered updating colonoscopy in the past and today to evaluate her intermittent diarrhea but she has declined.  Continue current regimen.  Return to the office for follow-up.  Patient prefers to be seen in 3 months, will make arrangements.

## 2020-08-06 ENCOUNTER — Ambulatory Visit (INDEPENDENT_AMBULATORY_CARE_PROVIDER_SITE_OTHER): Payer: Medicare Other | Admitting: Gastroenterology

## 2020-08-06 ENCOUNTER — Other Ambulatory Visit: Payer: Self-pay

## 2020-08-06 ENCOUNTER — Encounter: Payer: Self-pay | Admitting: Gastroenterology

## 2020-08-06 VITALS — BP 130/80 | HR 95 | Temp 97.0°F | Ht 63.0 in | Wt 247.2 lb

## 2020-08-06 DIAGNOSIS — K580 Irritable bowel syndrome with diarrhea: Secondary | ICD-10-CM | POA: Diagnosis not present

## 2020-08-06 NOTE — Patient Instructions (Addendum)
1. Continue dicyclomine 10 mg twice daily for irritable bowel syndrome with diarrhea. 2. Return to the office in July 2022.

## 2020-08-06 NOTE — Progress Notes (Signed)
Primary Care Physician: Wilkie Aye, NP  Primary Gastroenterologist:  Roetta Sessions, MD   Chief Complaint  Patient presents with  . Irritable Bowel Syndrome    Has had a little constipation but Bowels moved this morning and having normal BM in between    HPI: Valerie [name] is a 60 y.o. female here for follow-up of IBS-D.  Last seen in October.  Colonoscopy currently up-to-date, last performed in 2014 by Dr. Allena Katz, had moderate pancolonic diverticulosis.  Due for repeat colonoscopy in 2024.  Previously offered her possibility of colonoscopy for history of diarrhea but she declined.  Patient states she is doing very well.  She is on dicyclomine twice daily.  She has had no diarrhea in the past 2-1/2 months.  She is thrilled about this.  Has not had to use any Imodium.  Her appetite has been good.  She has had a little bit of constipation which she takes milk of magnesia as needed but not often.  Typically has a bowel movement every day.  No melena or rectal bleeding.  No abdominal pain.  Heartburn is controlled.  Weight is a up 17 pounds in the last year.   Current Outpatient Medications  Medication Sig Dispense Refill  . acetaminophen (TYLENOL) 500 MG tablet Take 500 mg by mouth every 4 (four) hours as needed.    Marland Kitchen alum & mag hydroxide-simeth (MAALOX/MYLANTA) 200-200-20 MG/5ML suspension Take 30 mLs by mouth as needed for indigestion or heartburn.    Marland Kitchen amLODipine (NORVASC) 5 MG tablet Take 5 mg by mouth at bedtime.     Marland Kitchen aspirin EC 81 MG tablet Take 81 mg by mouth daily.    . benztropine (COGENTIN) 0.5 MG tablet Take 0.5 mg by mouth 2 (two) times daily.    . busPIRone (BUSPAR) 10 MG tablet Take 10 mg by mouth 2 (two) times daily.    . busPIRone (BUSPAR) 5 MG tablet Take 5 mg by mouth at bedtime.    . cholecalciferol (VITAMIN D3) 25 MCG (1000 UNIT) tablet Take 1,000 Units by mouth daily.    Marland Kitchen dicyclomine (BENTYL) 10 MG capsule TAKE 1 CAP BY MOUTH BEFORE BREAKFAST AND  BEFORE SUPPER *HOLD FOR CONSTIPATION* (Patient taking differently: Take 10 mg by mouth 2 (two) times daily.) 60 capsule 1  . docusate sodium (COLACE) 100 MG capsule Take 100 mg by mouth at bedtime. As needed    . DULoxetine (CYMBALTA) 20 MG capsule Take 20 mg by mouth 2 (two) times daily.     Marland Kitchen guaifenesin (ROBITUSSIN) 100 MG/5ML syrup Take 200 mg by mouth every 6 (six) hours as needed for cough.    . hydrochlorothiazide (HYDRODIURIL) 25 MG tablet Take 25 mg by mouth daily.    Bess Harvest Ethyl (VASCEPA) 1 g CAPS Take 2 g by mouth 2 (two) times a day.    . lisinopril (ZESTRIL) 40 MG tablet Take 40 mg by mouth daily.    Marland Kitchen loperamide (IMODIUM) 2 MG capsule Take 2 mg by mouth as needed for diarrhea or loose stools. Take 1 capsule by mouth as needed with each loose stool/diarrhea nte 8 doses in 24 hr    . magnesium hydroxide (CVS MILK OF MAGNESIA) 400 MG/5ML suspension Take by mouth at bedtime as needed for mild constipation.    . medroxyPROGESTERone (DEPO-PROVERA) 150 MG/ML injection Inject 150 mg into the muscle every 3 (three) months.    . metFORMIN (GLUCOPHAGE) 500 MG tablet Take 500 mg by mouth daily  with breakfast.    . Multiple Vitamins-Minerals (MULTIVITAMIN) tablet Take 1 tablet by mouth daily.    . naproxen (NAPROSYN) 500 MG tablet Take 500 mg by mouth every 12 (twelve) hours as needed.    . NEOMYCIN-BACITRACIN-POLYMYXIN EX Apply topically daily as needed.    Marland Kitchen omeprazole (PRILOSEC) 20 MG capsule Take 20 mg by mouth daily.    . ondansetron (ZOFRAN) 4 MG tablet Take 4 mg by mouth every 8 (eight) hours as needed for nausea or vomiting.    . propranolol (INDERAL) 40 MG tablet Take 80 mg by mouth daily.     . sitaGLIPtin (JANUVIA) 50 MG tablet Take 100 mg by mouth daily.     . Skin Protectants, Misc. (EUCERIN) cream Apply topically 2 (two) times a day.     No current facility-administered medications for this visit.    Allergies as of 08/06/2020 - Review Complete 08/06/2020  Allergen  Reaction Noted  . Other  05/06/2020  . Risperidone and related  01/18/2019    ROS:  General: Negative for anorexia, weight loss, fever, chills, fatigue, weakness. ENT: Negative for hoarseness, difficulty swallowing , nasal congestion. CV: Negative for chest pain, angina, palpitations, dyspnea on exertion, peripheral edema.  Respiratory: Negative for dyspnea at rest, dyspnea on exertion, cough, sputum, wheezing.  GI: See history of present illness. GU:  Negative for dysuria, hematuria, urinary incontinence, urinary frequency, nocturnal urination.  Endo: Negative for unusual weight change.    Physical Examination:   BP 130/80   Pulse 95   Temp (!) 97 F (36.1 C)   Ht 5\' 3"  (1.6 m)   Wt 247 lb 3.2 oz (112.1 kg)   BMI 43.79 kg/m   General: Well-nourished, well-developed in no acute distress.  Eyes: No icterus. Mouth: masked Abdomen: Bowel sounds are normal, nontender, nondistended, no hepatosplenomegaly or masses, no abdominal bruits or hernia , no rebound or guarding.   Extremities: No lower extremity edema. No clubbing or deformities. Neuro: Alert and oriented x 4   Skin: Warm and dry, no jaundice.   Psych: Alert and cooperative, normal mood and affect.   Imaging Studies: No results found.  Impression/plan:  Pleasant 60 year old female with intermittent loose stools with suspected IBS-D.  She has been on dicyclomine 10 mg twice daily and doing very well.  No diarrhea in over 2 months.  Denies significant constipation.  She is due for colonoscopy in 2024.  We will have her come back in July for 72-month follow-up.  She can call sooner if needed.

## 2020-12-12 ENCOUNTER — Ambulatory Visit (HOSPITAL_COMMUNITY)
Admission: RE | Admit: 2020-12-12 | Discharge: 2020-12-12 | Disposition: A | Discharge status: Home / Self Care | Payer: Medicare Other | Source: Ambulatory Visit | Attending: Adult Health Nurse Practitioner | Admitting: Adult Health Nurse Practitioner

## 2020-12-12 ENCOUNTER — Other Ambulatory Visit: Payer: Self-pay

## 2020-12-12 DIAGNOSIS — Z029 Encounter for administrative examinations, unspecified: Secondary | ICD-10-CM | POA: Diagnosis present

## 2021-02-03 ENCOUNTER — Ambulatory Visit (INDEPENDENT_AMBULATORY_CARE_PROVIDER_SITE_OTHER): Payer: Medicare Other | Admitting: Gastroenterology

## 2021-02-03 ENCOUNTER — Encounter: Payer: Self-pay | Admitting: Gastroenterology

## 2021-02-03 ENCOUNTER — Other Ambulatory Visit: Payer: Self-pay

## 2021-02-03 VITALS — BP 155/96 | HR 94 | Temp 96.9°F | Ht 63.0 in | Wt 243.2 lb

## 2021-02-03 DIAGNOSIS — K580 Irritable bowel syndrome with diarrhea: Secondary | ICD-10-CM | POA: Diagnosis not present

## 2021-02-03 NOTE — Patient Instructions (Addendum)
Continue dicyclomine as before.  Return to office in six months, 07/2021. Next colonoscopy will be 07/2022.

## 2021-02-03 NOTE — Progress Notes (Signed)
Primary Care Physician: Wilkie Aye, NP  Primary Gastroenterologist:  Roetta Sessions, MD   Chief Complaint  Patient presents with   Irritable Bowel Syndrome    Doing ok, no diarrhea    HPI: Valerie [name] is a 60 y.o. female here for follow-up of IBS-D.  Last seen in January.  Last colonoscopy in 2014 by Dr. Allena Katz, had moderate pancolonic diverticulosis.  Due for repeat colonoscopy in 2024.  Previously offered her possibility of colonoscopy for history of diarrhea but she declined.  Takes dicyclomine twice daily.  Weight has been stable.  Bowel movements well controlled.  BM every day.  Can give couple of months at a time without any loose stools.  Denies any blood in the stool or melena.  Appetite is good.  No abdominal pain.  No reflux.  Current Outpatient Medications  Medication Sig Dispense Refill   acetaminophen (TYLENOL) 500 MG tablet Take 500 mg by mouth every 6 (six) hours as needed.     alum & mag hydroxide-simeth (MAALOX/MYLANTA) 200-200-20 MG/5ML suspension Take 30 mLs by mouth as needed for indigestion or heartburn.     amLODipine (NORVASC) 5 MG tablet Take 5 mg by mouth at bedtime.      aspirin EC 81 MG tablet Take 81 mg by mouth daily.     benztropine (COGENTIN) 0.5 MG tablet Take 0.5 mg by mouth 2 (two) times daily.     busPIRone (BUSPAR) 10 MG tablet Take 10 mg by mouth 2 (two) times daily.     busPIRone (BUSPAR) 5 MG tablet Take 5 mg by mouth at bedtime.     cholecalciferol (VITAMIN D3) 25 MCG (1000 UNIT) tablet Take 1,000 Units by mouth daily.     cloZAPine (CLOZARIL) 100 MG tablet Take 200 mg by mouth 2 (two) times daily.     dicyclomine (BENTYL) 10 MG capsule TAKE 1 CAP BY MOUTH BEFORE BREAKFAST AND BEFORE SUPPER *HOLD FOR CONSTIPATION* (Patient taking differently: Take 10 mg by mouth 2 (two) times daily. Before  breakfast and supper) 60 capsule 1   DULoxetine (CYMBALTA) 20 MG capsule Take 20 mg by mouth 2 (two) times daily.      guaifenesin  (ROBITUSSIN) 100 MG/5ML syrup Take 200 mg by mouth every 6 (six) hours as needed for cough.     hydrochlorothiazide (HYDRODIURIL) 25 MG tablet Take 25 mg by mouth daily.     Icosapent Ethyl (VASCEPA) 1 g CAPS Take 2 g by mouth 2 (two) times a day.     lisinopril (ZESTRIL) 40 MG tablet Take 40 mg by mouth daily.     loperamide (IMODIUM) 2 MG capsule Take 2 mg by mouth as needed for diarrhea or loose stools. Take 1 capsule by mouth as needed with each loose stool/diarrhea nte 8 doses in 24 hr     magnesium hydroxide ([employer] MILK OF MAGNESIA) 400 MG/5ML suspension Take by mouth at bedtime as needed for mild constipation.     metFORMIN (GLUCOPHAGE) 500 MG tablet Take 500 mg by mouth 2 (two) times daily with a meal.     Multiple Vitamin (DAILY VITE PO) Take 1 tablet by mouth daily.     naproxen (NAPROSYN) 500 MG tablet Take 500 mg by mouth every 12 (twelve) hours as needed.     NEOMYCIN-BACITRACIN-POLYMYXIN EX Apply topically daily as needed.     omeprazole (PRILOSEC) 20 MG capsule Take 20 mg by mouth daily.     ondansetron (ZOFRAN) 4 MG tablet Take 4  mg by mouth every 8 (eight) hours as needed for nausea or vomiting.     propranolol (INDERAL) 40 MG tablet Take 40 mg by mouth 2 (two) times daily.     sitaGLIPtin (JANUVIA) 50 MG tablet Take 100 mg by mouth daily.      No current facility-administered medications for this visit.    Allergies as of 02/03/2021 - Review Complete 02/03/2021  Allergen Reaction Noted   Other  05/06/2020   Risperidone and related  01/18/2019    ROS:  General: Negative for anorexia, weight loss, fever, chills, fatigue, weakness. ENT: Negative for hoarseness, difficulty swallowing , nasal congestion. CV: Negative for chest pain, angina, palpitations, dyspnea on exertion, peripheral edema.  Respiratory: Negative for dyspnea at rest, dyspnea on exertion, cough, sputum, wheezing.  GI: See history of present illness. GU:  Negative for dysuria, hematuria, urinary  incontinence, urinary frequency, nocturnal urination.  Endo: Negative for unusual weight change.    Physical Examination:   BP (!) 155/96   Pulse 94   Temp (!) 96.9 F (36.1 C) (Temporal)   Ht 5\' 3"  (1.6 m)   Wt 243 lb 3.2 oz (110.3 kg)   BMI 43.08 kg/m   General: Well-nourished, well-developed in no acute distress.  Eyes: No icterus. Mouth: masked Lungs: Clear to auscultation bilaterally.  Heart: Regular rate and rhythm, no murmurs rubs or gallops.  Abdomen: Bowel sounds are normal, nontender, nondistended, no hepatosplenomegaly or masses, no abdominal bruits or hernia , no rebound or guarding.   Extremities: No lower extremity edema. No clubbing or deformities. Neuro: Alert and oriented x 4   Skin: Warm and dry, no jaundice.   Psych: Alert and cooperative, normal mood and affect.  Labs:  None available.    Imaging Studies: No results found.   Assessment/plan:  Very pleasant 60 year old female with history of intermittent loose stools felt to be related to IBS-D.  Continues to do very well utilizing dicyclomine 10 mg twice daily.  Denies significant diarrhea.  No constipation.  She is due for colonoscopy in 2024 for screening purposes.  We have discussed in the past potential colonoscopy to further evaluate diarrhea but she is declined.  We discussed again today but she wants to wait 2024.  If she has any change in bowels, blood in stool, anemia, would recommend colonoscopy sooner.  Otherwise we will see her back in 6 months.

## 2021-08-06 ENCOUNTER — Ambulatory Visit: Payer: Medicare Other | Admitting: Gastroenterology

## 2021-12-02 ENCOUNTER — Encounter: Payer: Self-pay | Admitting: Gastroenterology

## 2021-12-02 ENCOUNTER — Ambulatory Visit (INDEPENDENT_AMBULATORY_CARE_PROVIDER_SITE_OTHER): Payer: Medicare Other | Admitting: Gastroenterology

## 2021-12-02 VITALS — BP 138/84 | HR 110 | Temp 97.3°F | Ht 64.0 in | Wt 227.4 lb

## 2021-12-02 DIAGNOSIS — K580 Irritable bowel syndrome with diarrhea: Secondary | ICD-10-CM | POA: Diagnosis not present

## 2021-12-02 NOTE — Patient Instructions (Addendum)
Continue Imodium as needed to manage diarrhea.  Return to the office in six months (06/2022) or call sooner if any questions/concerns. At your next office visit, we will schedule your colonoscopy for 07/2022.

## 2021-12-02 NOTE — Progress Notes (Signed)
GI Office Note    Referring Provider: Wilkie Aye* Primary Care Physician:  Wilkie Aye, NP  Primary Gastroenterologist: Roetta Sessions, MD   Chief Complaint   Chief Complaint  Patient presents with   Follow-up    Doing a lot better, still taking the imodium.     History of Present Illness   Valerie [name] is a 61 y.o. female presenting today here for follow-up of IBS-D.  Last seen in July 2022.  Last colonoscopy in 2014 by Dr. Allena Katz, moderate pancolonic diverticulosis.  Due for repeat in 2024.  She was previously offered updated colonoscopy for history of diarrhea but she declined. At last visit she was doing well on dicyclomine for IBS-D.  Today: It is noted patient's weight is down about 15 pounds since July 2022. No melena, brbpr. No abdominal pain. No vomiting. Appetite is good. Trying to eat healthier. Does have some nausea on occasion. No heartburn. Takes Imodium once every night. Also with medication changes. No longer on Cogentin and Vascepa. Watching diet and trying to exercise.    Medications   Current Outpatient Medications  Medication Sig Dispense Refill   amLODipine (NORVASC) 5 MG tablet Take 5 mg by mouth at bedtime.      busPIRone (BUSPAR) 10 MG tablet Take 10 mg by mouth 2 (two) times daily.     busPIRone (BUSPAR) 5 MG tablet Take 5 mg by mouth at bedtime.     cholecalciferol (VITAMIN D3) 25 MCG (1000 UNIT) tablet Take 1,000 Units by mouth daily.     cloZAPine (CLOZARIL) 100 MG tablet Take 200 mg by mouth 2 (two) times daily.     DULoxetine (CYMBALTA) 20 MG capsule Take 20 mg by mouth 2 (two) times daily.      hydrochlorothiazide (HYDRODIURIL) 25 MG tablet Take 25 mg by mouth daily.     lisinopril (ZESTRIL) 40 MG tablet Take 40 mg by mouth daily.     loperamide (IMODIUM) 2 MG capsule Take 2 mg by mouth as needed for diarrhea or loose stools. Take 1 capsule by mouth as needed with each loose stool/diarrhea nte 8 doses in 24 hr      metFORMIN (GLUCOPHAGE) 500 MG tablet Take 1,000 mg by mouth 2 (two) times daily with a meal.     Multiple Vitamin (DAILY VITE PO) Take 1 tablet by mouth daily.     naproxen (NAPROSYN) 500 MG tablet Take 500 mg by mouth every 12 (twelve) hours as needed.     omeprazole (PRILOSEC) 20 MG capsule Take 20 mg by mouth daily.     ondansetron (ZOFRAN) 4 MG tablet Take 4 mg by mouth every 8 (eight) hours as needed for nausea or vomiting.     propranolol (INDERAL) 40 MG tablet Take 40 mg by mouth 2 (two) times daily.     sitaGLIPtin (JANUVIA) 50 MG tablet Take 100 mg by mouth daily.      No current facility-administered medications for this visit.    Allergies   Allergies as of 12/02/2021 - Review Complete 12/02/2021  Allergen Reaction Noted   Other  05/06/2020   Risperidone and related  01/18/2019       Review of Systems   General: Negative for anorexia, unintentional weight loss, fever, chills, fatigue, weakness. ENT: Negative for hoarseness, difficulty swallowing , nasal congestion. CV: Negative for chest pain, angina, palpitations, dyspnea on exertion, peripheral edema.  Respiratory: Negative for dyspnea at rest, dyspnea on exertion, cough, sputum, wheezing.  GI:  See history of present illness. GU:  Negative for dysuria, hematuria, urinary incontinence, urinary frequency, nocturnal urination.  Endo: Negative for unusual weight change.     Physical Exam   BP 138/84 (BP Location: Left Arm, Patient Position: Sitting, Cuff Size: Large)   Pulse (!) 110   Temp (!) 97.3 F (36.3 C) (Temporal)   Ht 5\' 4"  (1.626 m)   Wt 227 lb 6.4 oz (103.1 kg)   SpO2 97%   BMI 39.03 kg/m    General: Well-nourished, well-developed in no acute distress.  Eyes: No icterus. Mouth: Oropharyngeal mucosa moist and pink , no lesions erythema or exudate. Lungs: Clear to auscultation bilaterally.  Heart: Regular rate and rhythm, no murmurs rubs or gallops.  Abdomen: Bowel sounds are normal, nontender,  nondistended, no hepatosplenomegaly or masses,  no abdominal bruits or hernia, no rebound or guarding.  Rectal: not performed Extremities: trace to 1+ lower extremity edema. No clubbing or deformities. Neuro: Alert and oriented x 4   Skin: Warm and dry, no jaundice.   Psych: Alert and cooperative, normal mood and affect.  Labs  None available Imaging Studies   No results found.  Assessment   IBS-D: doing well at this time. Uses imodium once daily. She is happy with how she is doing right now and wants to wait until 2024 for next colonoscopy.     PLAN   Continue imodium as needed to manage diarrhea.  Return office visit in six months or call sooner if any questions/concerns.  Will schedule colonoscopy at next office visit.     2025. Leanna Battles, MHS, PA-C [facility]

## 2021-12-10 ENCOUNTER — Encounter: Payer: Self-pay | Admitting: Gastroenterology

## 2022-05-24 ENCOUNTER — Other Ambulatory Visit: Payer: Self-pay

## 2022-05-24 ENCOUNTER — Encounter (HOSPITAL_COMMUNITY): Payer: Self-pay

## 2022-05-24 ENCOUNTER — Emergency Department (HOSPITAL_COMMUNITY)
Admission: EM | Admit: 2022-05-24 | Discharge: 2022-05-24 | Disposition: A | Discharge status: Skilled Nursing Facility | Payer: Medicare Other | Attending: Emergency Medicine | Admitting: Emergency Medicine

## 2022-05-24 DIAGNOSIS — R112 Nausea with vomiting, unspecified: Secondary | ICD-10-CM | POA: Diagnosis not present

## 2022-05-24 DIAGNOSIS — Z7984 Long term (current) use of oral hypoglycemic drugs: Secondary | ICD-10-CM | POA: Insufficient documentation

## 2022-05-24 DIAGNOSIS — Z79899 Other long term (current) drug therapy: Secondary | ICD-10-CM | POA: Diagnosis not present

## 2022-05-24 DIAGNOSIS — R1110 Vomiting, unspecified: Secondary | ICD-10-CM | POA: Diagnosis present

## 2022-05-24 LAB — CBC
HCT: 39.6 % (ref 36.0–46.0)
Hemoglobin: 13 g/dL (ref 12.0–15.0)
MCH: 28.4 pg (ref 26.0–34.0)
MCHC: 32.8 g/dL (ref 30.0–36.0)
MCV: 86.7 fL (ref 80.0–100.0)
Platelets: 272 10*3/uL (ref 150–400)
RBC: 4.57 MIL/uL (ref 3.87–5.11)
RDW: 14 % (ref 11.5–15.5)
WBC: 10.6 10*3/uL — ABNORMAL HIGH (ref 4.0–10.5)
nRBC: 0 % (ref 0.0–0.2)

## 2022-05-24 LAB — COMPREHENSIVE METABOLIC PANEL
ALT: 16 U/L (ref 0–44)
AST: 17 U/L (ref 15–41)
Albumin: 3.8 g/dL (ref 3.5–5.0)
Alkaline Phosphatase: 114 U/L (ref 38–126)
Anion gap: 10 (ref 5–15)
BUN: 9 mg/dL (ref 6–20)
CO2: 26 mmol/L (ref 22–32)
Calcium: 9.3 mg/dL (ref 8.9–10.3)
Chloride: 101 mmol/L (ref 98–111)
Creatinine, Ser: 0.69 mg/dL (ref 0.44–1.00)
GFR, Estimated: >60 mL/min (ref >60)
Glucose, Bld: 148 mg/dL — ABNORMAL HIGH (ref 70–99)
Potassium: 3.4 mmol/L — ABNORMAL LOW (ref 3.5–5.1)
Sodium: 137 mmol/L (ref 135–145)
Total Bilirubin: 0.4 mg/dL (ref 0.3–1.2)
Total Protein: 7.4 g/dL (ref 6.5–8.1)

## 2022-05-24 LAB — LIPASE, BLOOD: Lipase: 32 U/L (ref 11–51)

## 2022-05-24 LAB — URINALYSIS, ROUTINE W REFLEX MICROSCOPIC
Bilirubin Urine: NEGATIVE
Glucose, UA: NEGATIVE mg/dL
Hgb urine dipstick: NEGATIVE
Ketones, ur: NEGATIVE mg/dL
Nitrite: POSITIVE — AB
Protein, ur: NEGATIVE mg/dL
Specific Gravity, Urine: 1.015 (ref 1.005–1.030)
pH: 7 (ref 5.0–8.0)

## 2022-05-24 LAB — URINALYSIS, MICROSCOPIC (REFLEX)

## 2022-05-24 NOTE — ED Triage Notes (Signed)
[facility] from [facility]. Cc of emesis after dinner x2. No emesis since an  hour before ems arrival. Able to stand and walk to stretcher from ems.  Only pain is in her lower back.

## 2022-05-24 NOTE — ED Notes (Signed)
Patient given crackers and diet sprite.

## 2022-05-24 NOTE — ED Notes (Signed)
Report called and given to [facility] staff. Patient Discharged. EMS transport to facility.

## 2022-05-24 NOTE — ED Provider Notes (Signed)
[facility] EMERGENCY DEPARTMENT  Provider Note  CSN: [id number] Arrival date & time: 05/24/22 0032  History Chief Complaint  Patient presents with   Emesis    Valerie [name] is a 61 y.o. female with history of schizophrenia living at [facility] reports she had 2 episodes of vomiting after dinner tonight. She was given a dose of nausea medication earlier this evening and then sent to the ED. She reports her symptoms have since resolved. She denies any abdominal pain, diarrhea or dysuria.    Home Medications Prior to Admission medications   Medication Sig Start Date End Date Taking? Authorizing Provider  amLODipine (NORVASC) 5 MG tablet Take 5 mg by mouth at bedtime.     [provider]  busPIRone (BUSPAR) 10 MG tablet Take 10 mg by mouth 2 (two) times daily.    [provider]  busPIRone (BUSPAR) 5 MG tablet Take 5 mg by mouth at bedtime.    [provider]  cholecalciferol (VITAMIN D3) 25 MCG (1000 UNIT) tablet Take 1,000 Units by mouth daily.    [provider]  cloZAPine (CLOZARIL) 100 MG tablet Take 200 mg by mouth 2 (two) times daily.    [provider]  DULoxetine (CYMBALTA) 20 MG capsule Take 20 mg by mouth 2 (two) times daily.     [provider]  hydrochlorothiazide (HYDRODIURIL) 25 MG tablet Take 25 mg by mouth daily.    [provider]  lisinopril (ZESTRIL) 40 MG tablet Take 40 mg by mouth daily.    [provider]  loperamide (IMODIUM) 2 MG capsule Take 2 mg by mouth as needed for diarrhea or loose stools. Take 1 capsule by mouth as needed with each loose stool/diarrhea nte 8 doses in 24 hr    [provider]  metFORMIN (GLUCOPHAGE) 500 MG tablet Take 1,000 mg by mouth 2 (two) times daily with a meal.    [provider]  Multiple Vitamin (DAILY VITE PO) Take 1 tablet by mouth daily.    [provider]  naproxen (NAPROSYN) 500 MG tablet Take 500 mg by mouth every 12  (twelve) hours as needed.    [provider]  omeprazole (PRILOSEC) 20 MG capsule Take 20 mg by mouth daily.    [provider]  ondansetron (ZOFRAN) 4 MG tablet Take 4 mg by mouth every 8 (eight) hours as needed for nausea or vomiting.    [provider]  propranolol (INDERAL) 40 MG tablet Take 40 mg by mouth 2 (two) times daily.    [provider]  sitaGLIPtin (JANUVIA) 50 MG tablet Take 100 mg by mouth daily.     [provider]     Allergies    Other and Risperidone and related   Review of Systems   Review of Systems Please see HPI for pertinent positives and negatives  Physical Exam BP (!) 157/99   Pulse 73   Temp 97.6 F (36.4 C)   Resp 17   Ht 5\' 4"  (1.626 m)   Wt 103.1 kg   SpO2 98%   BMI 39.01 kg/m   Physical Exam Vitals and nursing note reviewed.  Constitutional:      Appearance: Normal appearance.  HENT:     Head: Normocephalic and atraumatic.     Nose: Nose normal.     Mouth/Throat:     Mouth: Mucous membranes are moist.  Eyes:     Extraocular Movements: Extraocular movements intact.     Conjunctiva/sclera: Conjunctivae  normal.  Cardiovascular:     Rate and Rhythm: Normal rate.  Pulmonary:     Effort: Pulmonary effort is normal.     Breath sounds: Normal breath sounds.  Abdominal:     General: Abdomen is flat. Bowel sounds are normal. There is no distension.     Palpations: Abdomen is soft.     Tenderness: There is no abdominal tenderness. There is no guarding.  Musculoskeletal:        General: No swelling. Normal range of motion.     Cervical back: Neck supple.  Skin:    General: Skin is warm and dry.  Neurological:     General: No focal deficit present.     Mental Status: She is alert.  Psychiatric:        Mood and Affect: Mood normal.     ED Results / Procedures / Treatments   EKG None  Procedures Procedures  Medications Ordered in the ED Medications - No data to display  Initial  Impression and Plan  Patient here with self-limited vomiting, currently asymptomatic with benign exam. Labs done in triage show unremarkable CBC, CMP, lipase and UA. Plan PO trial and then return to ALF.   ED Course       MDM Rules/Calculators/A&P Medical Decision Making Given presenting complaint, I considered that admission might be necessary. After review of results from ED lab and/or imaging studies, admission to the hospital is not indicated at this time.    Problems Addressed: Nausea and vomiting, unspecified vomiting type: acute illness or injury  Amount and/or Complexity of Data Reviewed Labs: ordered. Decision-making details documented in ED Course.    Final Clinical Impression(s) / ED Diagnoses Final diagnoses:  Nausea and vomiting, unspecified vomiting type    Rx / DC Orders ED Discharge Orders     None        Pollyann Savoy, MD 05/24/22 0246

## 2022-06-03 ENCOUNTER — Encounter: Payer: Self-pay | Admitting: Orthopaedic Surgery

## 2022-06-03 ENCOUNTER — Ambulatory Visit (INDEPENDENT_AMBULATORY_CARE_PROVIDER_SITE_OTHER): Payer: Medicare Other

## 2022-06-03 ENCOUNTER — Ambulatory Visit (INDEPENDENT_AMBULATORY_CARE_PROVIDER_SITE_OTHER): Payer: Medicare Other | Admitting: Orthopaedic Surgery

## 2022-06-03 DIAGNOSIS — G8929 Other chronic pain: Secondary | ICD-10-CM | POA: Diagnosis not present

## 2022-06-03 DIAGNOSIS — G5602 Carpal tunnel syndrome, left upper limb: Secondary | ICD-10-CM

## 2022-06-03 DIAGNOSIS — M5441 Lumbago with sciatica, right side: Secondary | ICD-10-CM | POA: Diagnosis not present

## 2022-06-03 DIAGNOSIS — G5601 Carpal tunnel syndrome, right upper limb: Secondary | ICD-10-CM

## 2022-06-03 NOTE — Patient Instructions (Addendum)
 You have chosen to have your imaging done at [facility]. You will need to call them and schedule your appointment. We will work on the approval. You MUST bring a CD and Report with you to your follow up to review imaging.   [facility] [address] [place] [address] PHONE: [phone]  Dr.Newton for nerve studies to check for carpal tunnel syndrome Aldean Baker [address] [place] PHONE: [phone]   Dr.Keeling is here all day on Tuesdays, Wednesday mornings, and Thursday mornings. If you need anything such as a medication refill, please either call BEFORE the end of the day on [facility] or send a message through [place]. Your pharmacy can send a refill request for you. Calling by the end of the day on [facility] allows [place] time to send Dr.Keeling the request and for him to respond before he leaves on Thursdays.  If Dr. Hilda Lias is out of the office, we may send it to one of the other providers and they may not refill it for the same amount that your original prescription is for.   MY NAME IS  AND I AM DR.KEELING'S ASSISTANT. IF YOU NEED ANYTHING, PLEASE DO NOT HESITATE TO EITHER SEND ME A MESSAGE VIA MYCHART OR CALL THE OFFICE [phone] AND LEAVE A MESSAGE FOR ME. I WILL RESPOND WITHIN 24-48 BUSINESS HOURS.

## 2022-06-03 NOTE — Progress Notes (Signed)
Subjective:    Patient ID: Valerie [name], female    DOB: 1961-06-03, 61 y.o.   MRN: [id number]  HPI She has over a six month history of lower back pain with right sided paresthesias.  She has been going to PT in [place] since July for treatments.  She arranged the treatments herself.  She has no trauma.  She has had some help with the PT but her back pain and pain running to the right foot has not improved that much.  She is concerned that she could have a pinched nerve.  She has no weakness of the lower extremities but she is unsteady with her gait at times and has begun use of a cane.  She has no falls. She has had some constipation.    She is on Naprosyn and that helps.  She has right hand pain, more at night and numbness.  She has no trauma.  The left side has similar problems at times but not as much.  She has no trauma.   Review of Systems  Constitutional:  Positive for activity change.  Musculoskeletal:  Positive for arthralgias, back pain, gait problem, joint swelling and myalgias.  All other systems reviewed and are negative. For Review of Systems, all other systems reviewed and are negative.  The following is a summary of the past history medically, past history surgically, known current medicines, social history and family history.  This information is gathered electronically by the computer from prior information and documentation.  I review this each visit and have found including this information at this point in the chart is beneficial and informative.   Past Medical History:  Diagnosis Date   HTN (hypertension)    Hypercholesteremia    Prediabetes    Schizophrenia (HCC)     Past Surgical History:  Procedure Laterality Date   COLONOSCOPY  2014   Dr. Allena Katz: Pancolonic diverticulosis, tortuous colon, next colonoscopy 2024   TONSILLECTOMY      Current Outpatient Medications on File Prior to Visit  Medication Sig Dispense Refill   busPIRone (BUSPAR) 10 MG  tablet Take 10 mg by mouth 2 (two) times daily.     busPIRone (BUSPAR) 5 MG tablet Take 5 mg by mouth at bedtime.     cholecalciferol (VITAMIN D3) 25 MCG (1000 UNIT) tablet Take 1,000 Units by mouth daily.     cloZAPine (CLOZARIL) 100 MG tablet Take 200 mg by mouth 2 (two) times daily.     hydrochlorothiazide (HYDRODIURIL) 25 MG tablet Take 25 mg by mouth daily.     lisinopril (ZESTRIL) 40 MG tablet Take 40 mg by mouth daily.     loperamide (IMODIUM) 2 MG capsule Take 2 mg by mouth as needed for diarrhea or loose stools. Take 1 capsule by mouth as needed with each loose stool/diarrhea nte 8 doses in 24 hr     metFORMIN (GLUCOPHAGE) 500 MG tablet Take 1,000 mg by mouth 2 (two) times daily with a meal.     Multiple Vitamin (DAILY VITE PO) Take 1 tablet by mouth daily.     naproxen (NAPROSYN) 500 MG tablet Take 500 mg by mouth every 12 (twelve) hours as needed.     omeprazole (PRILOSEC) 20 MG capsule Take 20 mg by mouth daily.     ondansetron (ZOFRAN) 4 MG tablet Take 4 mg by mouth every 8 (eight) hours as needed for nausea or vomiting.     propranolol (INDERAL) 40 MG tablet Take 40 mg by mouth 2 (  two) times daily.     sitaGLIPtin (JANUVIA) 50 MG tablet Take 100 mg by mouth daily.      No current facility-administered medications on file prior to visit.    Social History   Socioeconomic History   Marital status: Single    Spouse name: Not on file   Number of children: Not on file   Years of education: Not on file   Highest education level: Not on file  Occupational History   Not on file  Tobacco Use   Smoking status: Never   Smokeless tobacco: Never  Substance and Sexual Activity   Alcohol use: Never   Drug use: Never   Sexual activity: Not Currently    Birth control/protection: Pill  Other Topics Concern   Not on file  Social History Narrative   Not on file   Social Determinants of Health   Financial Resource Strain: Not on file  Food Insecurity: Not on file  Transportation  Needs: Not on file  Physical Activity: Not on file  Stress: Not on file  Social Connections: Not on file  Intimate Partner Violence: Not on file    Family History  Problem Relation Age of Onset   Colon cancer Neg Hx    Celiac disease Neg Hx    Inflammatory bowel disease Neg Hx     There were no vitals taken for this visit.  There is no height or weight on file to calculate BMI.      Objective:   Physical Exam Vitals and nursing note reviewed. Exam conducted with a chaperone present.  Constitutional:      Appearance: She is well-developed.  HENT:     Head: Normocephalic and atraumatic.  Eyes:     Conjunctiva/sclera: Conjunctivae normal.     Pupils: Pupils are equal, round, and reactive to light.  Cardiovascular:     Rate and Rhythm: Normal rate and regular rhythm.  Pulmonary:     Effort: Pulmonary effort is normal.  Abdominal:     Palpations: Abdomen is soft.  Musculoskeletal:       Arms:     Cervical back: Normal range of motion and neck supple.  Skin:    General: Skin is warm and dry.  Neurological:     Mental Status: She is alert and oriented to person, place, and time.     Cranial Nerves: No cranial nerve deficit.     Motor: No abnormal muscle tone.     Coordination: Coordination normal.     Deep Tendon Reflexes: Reflexes are normal and symmetric. Reflexes normal.  Psychiatric:        Behavior: Behavior normal.        Thought Content: Thought content normal.        Judgment: Judgment normal.   X-rays were done of the lumbar spine, reported separately.        Assessment & Plan:   Encounter Diagnoses  Name Primary?   Chronic bilateral low back pain with right-sided sciatica Yes   Carpal tunnel syndrome, left upper limb    Carpal tunnel syndrome, right upper limb    I will get MRI of the lumbar spine. To be done at open unit.  I will get EMGs of upper extremity.  Return in three weeks.  Call if any problem.  Precautions  discussed.  Electronically Signed Sanjuana Kava, MD 11/22/202311:05 AM

## 2022-06-08 ENCOUNTER — Ambulatory Visit: Payer: Medicare Other | Admitting: Gastroenterology

## 2022-06-26 ENCOUNTER — Ambulatory Visit (INDEPENDENT_AMBULATORY_CARE_PROVIDER_SITE_OTHER): Payer: Medicare Other | Admitting: Physical Medicine and Rehabilitation

## 2022-06-26 DIAGNOSIS — R202 Paresthesia of skin: Secondary | ICD-10-CM

## 2022-06-26 NOTE — Progress Notes (Unsigned)
Numeric Pain Rating Scale and Functional Assessment Average Pain 10   In the last MONTH (on 0-10 scale) has pain interfered with the following?  1. General activity like being  able to carry out your everyday physical activities such as walking, climbing stairs, carrying groceries, or moving a chair?  Rating(10)   Right handed. Weakness and pain in right hand.

## 2022-06-29 NOTE — Procedures (Unsigned)
EMG & NCV Findings: Evaluation of the right ulnar motor nerve showed decreased conduction velocity (B Elbow-Wrist, 51 m/s).  The left median (across palm) sensory and the right median (across palm) sensory nerves showed no response (Palm).  All remaining nerves (as indicated in the following tables) were within normal limits.  Left vs. Right side comparison data for the median motor nerve indicates abnormal L-R velocity difference (Elbow-Wrist, 10 m/s).    All examined muscles (as indicated in the following table) showed no evidence of electrical instability.    Impression: Essentially NORMAL electrodiagnostic study of both upper limbs.  There is no significant electrodiagnostic evidence of nerve entrapment, brachial plexopathy or cervical radiculopathy.  As you know, purely sensory or demyelinating radiculopathies and chemical radiculitis may not be detected with this particular electrodiagnostic study.  Recommendations: 1.  Follow-up with referring physician. 2.  Continue current management of symptoms.  ___________________________ Laurence Spates FAAPMR Board Certified, American Board of Physical Medicine and Rehabilitation    Nerve Conduction Studies Anti Sensory Summary Table   Stim Site NR Peak (ms) Norm Peak (ms) P-T Amp (V) Norm P-T Amp Site1 Site2 Delta-P (ms) Dist (cm) Vel (m/s) Norm Vel (m/s)  Left Median Acr Palm Anti Sensory (2nd Digit)  32.3C  Wrist    3.2 <3.6 21.8 >10 Wrist Palm  0.0    Palm *NR  <2.0          Right Median Acr Palm Anti Sensory (2nd Digit)  31.5C  Wrist    2.8 <3.6 28.6 >10 Wrist Palm  0.0    Palm *NR  <2.0          Right Ulnar Anti Sensory (5th Digit)  32C  Wrist    2.9 <3.7 15.8 >15.0 Wrist 5th Digit 2.9 14.0 48 >38   Motor Summary Table   Stim Site NR Onset (ms) Norm Onset (ms) O-P Amp (mV) Norm O-P Amp Site1 Site2 Delta-0 (ms) Dist (cm) Vel (m/s) Norm Vel (m/s)  Left Median Motor (Abd Poll Brev)  32.5C  Wrist    3.6 <4.2 7.1 >5 Elbow Wrist 3.6  22.0 61 >50  Elbow    7.2  5.1         Right Median Motor (Abd Poll Brev)  32.1C  Wrist    3.1 <4.2 6.3 >5 Elbow Wrist 3.9 20.0 51 >50  Elbow    7.0  6.9         Right Ulnar Motor (Abd Dig Min)  32C  Wrist    3.4 <4.2 4.8 >3 B Elbow Wrist 3.6 18.5 *51 >53  B Elbow    7.0  2.6          EMG   Side Muscle Nerve Root Ins Act Fibs Psw Amp Dur Poly Recrt Int Fraser Din Comment  Right Abd Poll Brev Median C8-T1 Nml Nml Nml Nml Nml 0 Nml Nml   Right 1stDorInt Ulnar C8-T1 Nml Nml Nml Nml Nml 0 Nml Nml   Right PronatorTeres Median C6-7 Nml Nml Nml Nml Nml 0 Nml Nml   Right Biceps Musculocut C5-6 Nml Nml Nml Nml Nml 0 Nml Nml   Right Deltoid Axillary C5-6 Nml Nml Nml Nml Nml 0 Nml Nml     Nerve Conduction Studies Anti Sensory Left/Right Comparison   Stim Site L Lat (ms) R Lat (ms) L-R Lat (ms) L Amp (V) R Amp (V) L-R Amp (%) Site1 Site2 L Vel (m/s) R Vel (m/s) L-R Vel (m/s)  Median Acr Palm Anti Sensory (  2nd Digit)  32.3C  Wrist 3.2 2.8 0.4 21.8 28.6 23.8 Wrist Palm     Palm             Ulnar Anti Sensory (5th Digit)  32C  Wrist  2.9   15.8  Wrist 5th Digit  48    Motor Left/Right Comparison   Stim Site L Lat (ms) R Lat (ms) L-R Lat (ms) L Amp (mV) R Amp (mV) L-R Amp (%) Site1 Site2 L Vel (m/s) R Vel (m/s) L-R Vel (m/s)  Median Motor (Abd Poll Brev)  32.5C  Wrist 3.6 3.1 0.5 7.1 6.3 11.3 Elbow Wrist 61 51 *10  Elbow 7.2 7.0 0.2 5.1 6.9 26.1       Ulnar Motor (Abd Dig Min)  32C  Wrist  3.4   4.8  B Elbow Wrist  *51   B Elbow  7.0   2.6           Waveforms:

## 2022-06-30 NOTE — Progress Notes (Signed)
Valerie [name] - 61 y.o. female MRN [id number]  Date of birth: December 18, 1960  Office Visit Note: Visit Date: 06/26/2022 PCP: Wilkie Aye, NP Referred by: Darreld Mclean, MD  Subjective: Chief Complaint  Patient presents with   Right Hand - Pain, Weakness   HPI:  Valerie [name] is a 61 y.o. female who comes in today at the request of Dr. Darreld Mclean for evaluation and management of the Bilateral upper extremities.  Patient is Right hand dominant.  Long-term chronic but recent worsening over the last several months of bilateral hand pain with numbness and weakness right much more than left.  No specific frank radicular symptoms.  Patient has no prior electrodiagnostic studies.   ROS Otherwise per HPI.  Assessment & Plan: Visit Diagnoses:    ICD-10-CM   1. Paresthesia of skin  R20.2 NCV with EMG (electromyography)      Plan: Impression: Essentially NORMAL electrodiagnostic study of both upper limbs.  There is no significant electrodiagnostic evidence of nerve entrapment, brachial plexopathy or cervical radiculopathy.    As you know, purely sensory or demyelinating radiculopathies and chemical radiculitis may not be detected with this particular electrodiagnostic study. **This electrodiagnostic study cannot rule out small fiber polyneuropathy and dysesthesias from central pain syndromes such as stroke or central pain sensitization syndromes such as fibromyalgia.  Myotomal referral pain from trigger points is also not excluded.  Recommendations: 1.  Follow-up with referring physician. 2.  Continue current management of symptoms.  Meds & Orders: No orders of the defined types were placed in this encounter.   Orders Placed This Encounter  Procedures   NCV with EMG (electromyography)    Follow-up: Return in about 2 weeks (around 07/10/2022) for Darreld Mclean, MD.   Procedures: No procedures performed  EMG & NCV Findings: Evaluation of the right ulnar motor nerve  showed decreased conduction velocity (B Elbow-Wrist, 51 m/s).  The left median (across palm) sensory and the right median (across palm) sensory nerves showed no response (Palm).  All remaining nerves (as indicated in the following tables) were within normal limits.  Left vs. Right side comparison data for the median motor nerve indicates abnormal L-R velocity difference (Elbow-Wrist, 10 m/s).    All examined muscles (as indicated in the following table) showed no evidence of electrical instability.    Impression: Essentially NORMAL electrodiagnostic study of both upper limbs.  There is no significant electrodiagnostic evidence of nerve entrapment, brachial plexopathy or cervical radiculopathy.    As you know, purely sensory or demyelinating radiculopathies and chemical radiculitis may not be detected with this particular electrodiagnostic study. **This electrodiagnostic study cannot rule out small fiber polyneuropathy and dysesthesias from central pain syndromes such as stroke or central pain sensitization syndromes such as fibromyalgia.  Myotomal referral pain from trigger points is also not excluded.  Recommendations: 1.  Follow-up with referring physician. 2.  Continue current management of symptoms.  ___________________________ Naaman Plummer FAAPMR Board Certified, American Board of Physical Medicine and Rehabilitation    Nerve Conduction Studies Anti Sensory Summary Table   Stim Site NR Peak (ms) Norm Peak (ms) P-T Amp (V) Norm P-T Amp Site1 Site2 Delta-P (ms) Dist (cm) Vel (m/s) Norm Vel (m/s)  Left Median Acr Palm Anti Sensory (2nd Digit)  32.3C  Wrist    3.2 <3.6 21.8 >10 Wrist Palm  0.0    Palm *NR  <2.0          Right Median Acr Palm Anti Sensory (2nd Digit)  31.5C  Wrist  2.8 <3.6 28.6 >10 Wrist Palm  0.0    Palm *NR  <2.0          Right Ulnar Anti Sensory (5th Digit)  32C  Wrist    2.9 <3.7 15.8 >15.0 Wrist 5th Digit 2.9 14.0 48 >38   Motor Summary Table   Stim Site  NR Onset (ms) Norm Onset (ms) O-P Amp (mV) Norm O-P Amp Site1 Site2 Delta-0 (ms) Dist (cm) Vel (m/s) Norm Vel (m/s)  Left Median Motor (Abd Poll Brev)  32.5C  Wrist    3.6 <4.2 7.1 >5 Elbow Wrist 3.6 22.0 61 >50  Elbow    7.2  5.1         Right Median Motor (Abd Poll Brev)  32.1C  Wrist    3.1 <4.2 6.3 >5 Elbow Wrist 3.9 20.0 51 >50  Elbow    7.0  6.9         Right Ulnar Motor (Abd Dig Min)  32C  Wrist    3.4 <4.2 4.8 >3 B Elbow Wrist 3.6 18.5 *51 >53  B Elbow    7.0  2.6          EMG   Side Muscle Nerve Root Ins Act Fibs Psw Amp Dur Poly Recrt Int Dennie Bible Comment  Right Abd Poll Brev Median C8-T1 Nml Nml Nml Nml Nml 0 Nml Nml   Right 1stDorInt Ulnar C8-T1 Nml Nml Nml Nml Nml 0 Nml Nml   Right PronatorTeres Median C6-7 Nml Nml Nml Nml Nml 0 Nml Nml   Right Biceps Musculocut C5-6 Nml Nml Nml Nml Nml 0 Nml Nml   Right Deltoid Axillary C5-6 Nml Nml Nml Nml Nml 0 Nml Nml     Nerve Conduction Studies Anti Sensory Left/Right Comparison   Stim Site L Lat (ms) R Lat (ms) L-R Lat (ms) L Amp (V) R Amp (V) L-R Amp (%) Site1 Site2 L Vel (m/s) R Vel (m/s) L-R Vel (m/s)  Median Acr Palm Anti Sensory (2nd Digit)  32.3C  Wrist 3.2 2.8 0.4 21.8 28.6 23.8 Wrist Palm     Palm             Ulnar Anti Sensory (5th Digit)  32C  Wrist  2.9   15.8  Wrist 5th Digit  48    Motor Left/Right Comparison   Stim Site L Lat (ms) R Lat (ms) L-R Lat (ms) L Amp (mV) R Amp (mV) L-R Amp (%) Site1 Site2 L Vel (m/s) R Vel (m/s) L-R Vel (m/s)  Median Motor (Abd Poll Brev)  32.5C  Wrist 3.6 3.1 0.5 7.1 6.3 11.3 Elbow Wrist 61 51 *10  Elbow 7.2 7.0 0.2 5.1 6.9 26.1       Ulnar Motor (Abd Dig Min)  32C  Wrist  3.4   4.8  B Elbow Wrist  *51   B Elbow  7.0   2.6           Waveforms:              Clinical History: No specialty comments available.     Objective:  VS:  HT:    WT:   BMI:     BP:   HR: bpm  TEMP: ( )  RESP:  Physical Exam Musculoskeletal:        General: No swelling, tenderness or  deformity.     Comments: Inspection reveals no atrophy of the bilateral APB or FDI or hand intrinsics. There is no swelling, color changes, allodynia or  dystrophic changes. There is 5 out of 5 strength in the bilateral wrist extension, finger abduction and long finger flexion. There is intact sensation to light touch in all dermatomal and peripheral nerve distributions. There is a negative Phalen's test bilaterally. There is a negative Hoffmann's test bilaterally.  Skin:    General: Skin is warm and dry.     Findings: No erythema or rash.  Neurological:     General: No focal deficit present.     Mental Status: She is alert and oriented to person, place, and time.     Motor: No weakness or abnormal muscle tone.     Coordination: Coordination normal.  Psychiatric:        Mood and Affect: Mood normal.        Behavior: Behavior normal.      Imaging: No results found.

## 2022-07-20 ENCOUNTER — Ambulatory Visit (INDEPENDENT_AMBULATORY_CARE_PROVIDER_SITE_OTHER): Payer: Medicare Other | Admitting: Gastroenterology

## 2022-07-20 ENCOUNTER — Encounter: Payer: Self-pay | Admitting: Gastroenterology

## 2022-07-20 VITALS — BP 132/89 | HR 98 | Temp 97.3°F | Ht 64.0 in | Wt 225.8 lb

## 2022-07-20 DIAGNOSIS — K580 Irritable bowel syndrome with diarrhea: Secondary | ICD-10-CM | POA: Diagnosis not present

## 2022-07-20 NOTE — Patient Instructions (Signed)
Glad you are doing well. We will see you back in 02/2023 and plan to schedule your colonoscopy at that time.

## 2022-07-20 NOTE — Progress Notes (Signed)
GI Office Note    Referring Provider: Harrie Foreman* Primary Care Physician:  Harrie Foreman, NP  Primary Gastroenterologist: Garfield Cornea, MD   Chief Complaint   Chief Complaint  Patient presents with   Follow-up    Pt here for follow up on IBS-D. Pt also states she was taken off her Bentyl by facility Dr    History of Present Illness   Valerie [name] is a 62 y.o. female presenting today for follow-up.  She was last seen in May.  She has a history of IBS-D.Last colonoscopy in 2014 by Dr. Posey Pronto, moderate pancolonic diverticulosis. Due for repeat in 2024. She was previously offered updated colonoscopy for history of diarrhea but she declined. At last visit she was doing well on dicyclomine for IBS-D.   Patient would like her colonoscopy in 03/2023. She is not interested in scheduling today. She is doing well. Has BM almost every day. Diarrhea well controlled with imodium once per day. No longer on bentyl. Reflux is well controlled. No melena, brbpr. No abdominal pain.  Weight stable the past 6 months.   Medications   Current Outpatient Medications  Medication Sig Dispense Refill   busPIRone (BUSPAR) 10 MG tablet Take 10 mg by mouth 2 (two) times daily.     busPIRone (BUSPAR) 5 MG tablet Take 5 mg by mouth at bedtime.     cholecalciferol (VITAMIN D3) 25 MCG (1000 UNIT) tablet Take 1,000 Units by mouth daily.     cloZAPine (CLOZARIL) 100 MG tablet Take 200 mg by mouth 2 (two) times daily.     hydrochlorothiazide (HYDRODIURIL) 25 MG tablet Take 25 mg by mouth daily.     lisinopril (ZESTRIL) 40 MG tablet Take 40 mg by mouth daily.     loperamide (IMODIUM) 2 MG capsule Take 2 mg by mouth as needed for diarrhea or loose stools. Take 1 capsule by mouth as needed with each loose stool/diarrhea nte 8 doses in 24 hr     metFORMIN (GLUCOPHAGE) 500 MG tablet Take 1,000 mg by mouth 2 (two) times daily with a meal.     Multiple Vitamin (DAILY VITE PO) Take 1 tablet by  mouth daily.     naproxen (NAPROSYN) 500 MG tablet Take 500 mg by mouth every 12 (twelve) hours as needed.     omeprazole (PRILOSEC) 20 MG capsule Take 20 mg by mouth daily.     ondansetron (ZOFRAN) 4 MG tablet Take 4 mg by mouth every 8 (eight) hours as needed for nausea or vomiting.     propranolol (INDERAL) 40 MG tablet Take 40 mg by mouth 2 (two) times daily.     sitaGLIPtin (JANUVIA) 50 MG tablet Take 100 mg by mouth daily.      No current facility-administered medications for this visit.    Allergies   Allergies as of 07/20/2022 - Review Complete 07/20/2022  Allergen Reaction Noted   Other  05/06/2020   Risperidone and related  01/18/2019       Review of Systems   General: Negative for anorexia, weight loss, fever, chills, fatigue, weakness. ENT: Negative for hoarseness, difficulty swallowing , nasal congestion. CV: Negative for chest pain, angina, palpitations, dyspnea on exertion, peripheral edema.  Respiratory: Negative for dyspnea at rest, dyspnea on exertion, cough, sputum, wheezing.  GI: See history of present illness. GU:  Negative for dysuria, hematuria, urinary incontinence, urinary frequency, nocturnal urination.  Endo: Negative for unusual weight change.     Physical Exam  BP 132/89   Pulse 98   Temp (!) 97.3 F (36.3 C)   Ht 5\' 4"  (1.626 m)   Wt 225 lb 12.8 oz (102.4 kg)   BMI 38.76 kg/m    General: Well-nourished, well-developed in no acute distress.  Eyes: No icterus. Mouth: Oropharyngeal mucosa moist and pink   Heart: Regular rate and rhythm, no murmurs rubs or gallops.  Abdomen: Bowel sounds are normal, nontender, nondistended  Rectal: not performed Extremities: No lower extremity edema. No clubbing or deformities. Neuro: Alert and oriented x 4   Skin: Warm and dry, no jaundice.   Psych: Alert and cooperative, normal mood and affect.  Labs   Lab Results  Component Value Date   WBC 10.6 (H) 05/24/2022   HGB 13.0 05/24/2022   HCT 39.6  05/24/2022   MCV 86.7 05/24/2022   PLT 272 05/24/2022   Lab Results  Component Value Date   ALT 16 05/24/2022   AST 17 05/24/2022   ALKPHOS 114 05/24/2022   BILITOT 0.4 05/24/2022     Imaging Studies   NCV with EMG (electromyography)  Result Date: 06/26/2022 Magnus Sinning, MD     06/30/2022  6:11 AM EMG & NCV Findings: Evaluation of the right ulnar motor nerve showed decreased conduction velocity (B Elbow-Wrist, 51 m/s).  The left median (across palm) sensory and the right median (across palm) sensory nerves showed no response (Palm).  All remaining nerves (as indicated in the following tables) were within normal limits.  Left vs. Right side comparison data for the median motor nerve indicates abnormal L-R velocity difference (Elbow-Wrist, 10 m/s).  All examined muscles (as indicated in the following table) showed no evidence of electrical instability.  Impression: Essentially NORMAL electrodiagnostic study of both upper limbs.  There is no significant electrodiagnostic evidence of nerve entrapment, brachial plexopathy or cervical radiculopathy.  As you know, purely sensory or demyelinating radiculopathies and chemical radiculitis may not be detected with this particular electrodiagnostic study. **This electrodiagnostic study cannot rule out small fiber polyneuropathy and dysesthesias from central pain syndromes such as stroke or central pain sensitization syndromes such as fibromyalgia.  Myotomal referral pain from trigger points is also not excluded. Recommendations: 1.  Follow-up with referring physician. 2.  Continue current management of symptoms. ___________________________ Laurence Spates FAAPMR Board Certified, American Board of Physical Medicine and Rehabilitation Nerve Conduction Studies Anti Sensory Summary Table  Stim Site NR Peak (ms) Norm Peak (ms) P-T Amp (V) Norm P-T Amp Site1 Site2 Delta-P (ms) Dist (cm) Vel (m/s) Norm Vel (m/s) Left Median Acr Palm Anti Sensory (2nd Digit)  32.3C  Wrist    3.2 <3.6 21.8 >10 Wrist Palm  0.0   Palm *NR  <2.0         Right Median Acr Palm Anti Sensory (2nd Digit)  31.5C Wrist    2.8 <3.6 28.6 >10 Wrist Palm  0.0   Palm *NR  <2.0         Right Ulnar Anti Sensory (5th Digit)  32C Wrist    2.9 <3.7 15.8 >15.0 Wrist 5th Digit 2.9 14.0 48 >38 Motor Summary Table  Stim Site NR Onset (ms) Norm Onset (ms) O-P Amp (mV) Norm O-P Amp Site1 Site2 Delta-0 (ms) Dist (cm) Vel (m/s) Norm Vel (m/s) Left Median Motor (Abd Poll Brev)  32.5C Wrist    3.6 <4.2 7.1 >5 Elbow Wrist 3.6 22.0 61 >50 Elbow    7.2  5.1        Right Median Motor (Abd  Poll Brev)  32.1C Wrist    3.1 <4.2 6.3 >5 Elbow Wrist 3.9 20.0 51 >50 Elbow    7.0  6.9        Right Ulnar Motor (Abd Dig Min)  32C Wrist    3.4 <4.2 4.8 >3 B Elbow Wrist 3.6 18.5 *51 >53 B Elbow    7.0  2.6        EMG  Side Muscle Nerve Root Ins Act Fibs Psw Amp Dur Poly Recrt Int Dennie Bible Comment Right Abd Poll Brev Median C8-T1 Nml Nml Nml Nml Nml 0 Nml Nml  Right 1stDorInt Ulnar C8-T1 Nml Nml Nml Nml Nml 0 Nml Nml  Right PronatorTeres Median C6-7 Nml Nml Nml Nml Nml 0 Nml Nml  Right Biceps Musculocut C5-6 Nml Nml Nml Nml Nml 0 Nml Nml  Right Deltoid Axillary C5-6 Nml Nml Nml Nml Nml 0 Nml Nml  Nerve Conduction Studies Anti Sensory Left/Right Comparison  Stim Site L Lat (ms) R Lat (ms) L-R Lat (ms) L Amp (V) R Amp (V) L-R Amp (%) Site1 Site2 L Vel (m/s) R Vel (m/s) L-R Vel (m/s) Median Acr Palm Anti Sensory (2nd Digit)  32.3C Wrist 3.2 2.8 0.4 21.8 28.6 23.8 Wrist Palm    Palm            Ulnar Anti Sensory (5th Digit)  32C Wrist  2.9   15.8  Wrist 5th Digit  48  Motor Left/Right Comparison  Stim Site L Lat (ms) R Lat (ms) L-R Lat (ms) L Amp (mV) R Amp (mV) L-R Amp (%) Site1 Site2 L Vel (m/s) R Vel (m/s) L-R Vel (m/s) Median Motor (Abd Poll Brev)  32.5C Wrist 3.6 3.1 0.5 7.1 6.3 11.3 Elbow Wrist 61 51 *10 Elbow 7.2 7.0 0.2 5.1 6.9 26.1      Ulnar Motor (Abd Dig Min)  32C Wrist  3.4   4.8  B Elbow Wrist  *51  B Elbow  7.0   2.6        Waveforms:          Assessment   IBS-D: doing well.   Screening colonoscopy: due this year. Patient wants to wait until after next ov.    PLAN   Return ov in 02/2023. Continue imodium as needed to manage diarrhea.    Leanna Battles. Melvyn Neth, MHS, PA-C [facility]

## 2022-07-30 ENCOUNTER — Ambulatory Visit (HOSPITAL_COMMUNITY)
Admission: RE | Admit: 2022-07-30 | Discharge: 2022-07-30 | Disposition: A | Discharge status: Home / Self Care | Payer: Medicare Other | Source: Ambulatory Visit | Attending: Orthopaedic Surgery | Admitting: Orthopaedic Surgery

## 2022-07-30 DIAGNOSIS — M5441 Lumbago with sciatica, right side: Secondary | ICD-10-CM | POA: Diagnosis not present

## 2022-07-30 DIAGNOSIS — G8929 Other chronic pain: Secondary | ICD-10-CM | POA: Insufficient documentation

## 2022-08-11 ENCOUNTER — Encounter: Payer: Self-pay | Admitting: Orthopaedic Surgery

## 2022-08-11 ENCOUNTER — Ambulatory Visit (INDEPENDENT_AMBULATORY_CARE_PROVIDER_SITE_OTHER): Payer: Medicare Other | Admitting: Orthopaedic Surgery

## 2022-08-11 DIAGNOSIS — M5441 Lumbago with sciatica, right side: Secondary | ICD-10-CM

## 2022-08-11 DIAGNOSIS — G8929 Other chronic pain: Secondary | ICD-10-CM

## 2022-08-11 NOTE — Patient Instructions (Signed)
You have been referred to [facility] on [address] in [place]. They will call you to schedule the appointment. If you have not heard anything in one week call them to schedule [phone]. This referral has to be uploaded to another system because they are not on the same medical records system as we are. Please allow us two business days to get this uploaded for you.   

## 2022-08-11 NOTE — Progress Notes (Signed)
My back hurts.  She had MRI of the lumbar spine showing: IMPRESSION: 1. L2-L3 moderate spinal canal stenosis with moderate to severe right and mild left neural foraminal narrowing. Effacement of the lateral recesses at this level likely compresses the descending L3 nerve roots. 2. L4-L5 mild spinal canal stenosis with moderate right and mild left neural foraminal narrowing. Narrowing of the lateral recesses at this level could affect the descending L5 nerve roots. 3. L5-S1 moderate to severe left and moderate right neural foraminal narrowing.  I have explained the findings to her.  I will have neurosurgery see her.  She agrees.  Her back is tender lower, no spasm, she is in wheelchair and prefers not to stand.  NV intact, SLR negative, muscle tone and strength normal.  Encounter Diagnosis  Name Primary?   Chronic bilateral low back pain with right-sided sciatica Yes   To neurosurgery.  I have completed forms for rest home.  I have independently reviewed the MRI.    Electronically Signed Sanjuana Kava, MD 1/30/20243:36 PM

## 2022-08-13 ENCOUNTER — Telehealth: Payer: Self-pay | Admitting: Orthopaedic Surgery

## 2022-08-13 NOTE — Telephone Encounter (Signed)
Levada Dy from [facility] called at 11:45 lvm stating there is no referral in the system so she can't make appt for the patient.   Please call her back at [phone]

## 2022-08-14 NOTE — Telephone Encounter (Signed)
Attempted to return call to let them know referral has now been uploaded to [facility]. No answer and unable to leave a vm.

## 2022-10-27 ENCOUNTER — Emergency Department (HOSPITAL_COMMUNITY): Payer: Medicare Other

## 2022-10-27 ENCOUNTER — Emergency Department (HOSPITAL_COMMUNITY)
Admission: EM | Admit: 2022-10-27 | Discharge: 2022-10-27 | Disposition: A | Discharge status: Skilled Nursing Facility | Payer: Medicare Other | Attending: Emergency Medicine | Admitting: Emergency Medicine

## 2022-10-27 DIAGNOSIS — Y92129 Unspecified place in nursing home as the place of occurrence of the external cause: Secondary | ICD-10-CM | POA: Insufficient documentation

## 2022-10-27 DIAGNOSIS — S0990XA Unspecified injury of head, initial encounter: Secondary | ICD-10-CM | POA: Diagnosis present

## 2022-10-27 DIAGNOSIS — W01198A Fall on same level from slipping, tripping and stumbling with subsequent striking against other object, initial encounter: Secondary | ICD-10-CM | POA: Insufficient documentation

## 2022-10-27 DIAGNOSIS — W19XXXA Unspecified fall, initial encounter: Secondary | ICD-10-CM

## 2022-10-27 LAB — URINALYSIS, ROUTINE W REFLEX MICROSCOPIC
Bilirubin Urine: NEGATIVE
Glucose, UA: NEGATIVE mg/dL
Hgb urine dipstick: NEGATIVE
Ketones, ur: NEGATIVE mg/dL
Leukocytes,Ua: NEGATIVE
Nitrite: NEGATIVE
Protein, ur: NEGATIVE mg/dL
Specific Gravity, Urine: 1.006 (ref 1.005–1.030)
pH: 7 (ref 5.0–8.0)

## 2022-10-27 NOTE — ED Triage Notes (Addendum)
Pt had unwitnessed fall at [facility], hit back of head, now having head pain, no LOC, no blood thinners. Right foot numbness x 3months, has been going through PT for numbness.

## 2022-10-27 NOTE — Discharge Instructions (Signed)
As discussed, your evaluation today has been largely reassuring.  But, it is important that you monitor your condition carefully, and do not hesitate to return to the ED if you develop new, or concerning changes in your condition. ? ?Otherwise, please follow-up with your physician for appropriate ongoing care. ? ?

## 2022-10-27 NOTE — ED Provider Notes (Signed)
[place] [facility]   CSN: [id number] Arrival date & time: 10/27/22  1144     History  Chief Complaint  Patient presents with   [name]    Valerie [name] is a 62 y.o. female.  HPI Presents with concern of head pain following a fall.  Just prior to ED arrival the patient fell, struck the back of her head.  No loss of consciousness, no confusion, dissertation, vision changes.  Pain has subsequently resolved, but she is concerned about the head trauma. No new weakness in any extremity.  She does have baseline difficulty with ambulation, this is unchanged.    Home Medications Prior to Admission medications   Medication Sig Start Date End Date Taking? Authorizing Provider  busPIRone (BUSPAR) 10 MG tablet Take 10 mg by mouth 2 (two) times daily.    [provider]  busPIRone (BUSPAR) 5 MG tablet Take 5 mg by mouth at bedtime.    [provider]  cholecalciferol (VITAMIN D3) 25 MCG (1000 UNIT) tablet Take 1,000 Units by mouth daily.    [provider]  cloZAPine (CLOZARIL) 100 MG tablet Take 200 mg by mouth 2 (two) times daily.    [provider]  hydrochlorothiazide (HYDRODIURIL) 25 MG tablet Take 25 mg by mouth daily.    [provider]  lisinopril (ZESTRIL) 40 MG tablet Take 40 mg by mouth daily.    [provider]  loperamide (IMODIUM) 2 MG capsule Take 2 mg by mouth as needed for diarrhea or loose stools. Take 1 capsule by mouth as needed with each loose stool/diarrhea nte 8 doses in 24 hr    [provider]  metFORMIN (GLUCOPHAGE) 500 MG tablet Take 1,000 mg by mouth 2 (two) times daily with a meal.    [provider]  Multiple Vitamin (DAILY VITE PO) Take 1 tablet by mouth daily.    [provider]  naproxen (NAPROSYN) 500 MG tablet Take 500 mg by mouth every 12 (twelve) hours as needed.    [provider]  omeprazole (PRILOSEC) 20 MG capsule  Take 20 mg by mouth daily.    [provider]  ondansetron (ZOFRAN) 4 MG tablet Take 4 mg by mouth every 8 (eight) hours as needed for nausea or vomiting.    [provider]  propranolol (INDERAL) 40 MG tablet Take 40 mg by mouth 2 (two) times daily.    [provider]  sitaGLIPtin (JANUVIA) 50 MG tablet Take 100 mg by mouth daily.     [provider]      Allergies    Other and Risperidone and related    Review of Systems   Review of Systems  All other systems reviewed and are negative.   Physical Exam Updated Vital Signs BP (!) 149/93 (BP Location: Left Arm)   Pulse 79   Temp 98.4 F (36.9 C) (Oral)   Resp 15   SpO2 100%  Physical Exam Vitals and nursing note reviewed.  Constitutional:      General: She is not in acute distress.    Appearance: She is well-developed.  HENT:     Head: Normocephalic and atraumatic.   Eyes:     Conjunctiva/sclera: Conjunctivae normal.  Cardiovascular:     Rate and Rhythm: Normal rate and regular rhythm.  Pulmonary:     Effort: Pulmonary effort is normal. No respiratory distress.     Breath sounds: Normal breath sounds. No stridor.  Abdominal:  General: There is no distension.  Musculoskeletal:     Cervical back: Full passive range of motion without pain, normal range of motion and neck supple. No spinous process tenderness or muscular tenderness.  Skin:    General: Skin is warm and dry.  Neurological:     Mental Status: She is alert and oriented to person, place, and time.     Cranial Nerves: No cranial nerve deficit.  Psychiatric:        Mood and Affect: Mood normal.     ED Results / Procedures / Treatments   Labs (all labs ordered are listed, but only abnormal results are displayed) Labs Reviewed  URINALYSIS, ROUTINE W REFLEX MICROSCOPIC    EKG None  Radiology CT Head Wo Contrast  Result Date: 10/27/2022 CLINICAL DATA:  Unwitnessed fall. EXAM: CT HEAD WITHOUT CONTRAST TECHNIQUE:  Contiguous axial images were obtained from the base of the skull through the vertex without intravenous contrast. RADIATION DOSE REDUCTION: This exam was performed according to the departmental dose-optimization program which includes automated exposure control, adjustment of the mA and/or kV according to patient size and/or use of iterative reconstruction technique. COMPARISON:  None Available. FINDINGS: Brain: There is no acute intracranial hemorrhage, extra-axial fluid collection, or acute infarct. Parenchymal volume is normal. The ventricles are normal in size. Gray-white differentiation is preserved. The pituitary and suprasellar region are normal. There is no mass lesion. There is no mass effect or midline shift. Vascular: No hyperdense vessel or unexpected calcification. Skull: Normal. Negative for fracture or focal lesion. Sinuses/Orbits: The paranasal sinuses are clear. The globes and orbits are unremarkable. Other: None. IMPRESSION: Normal head CT. Electronically Signed   By: Lesia Hausen M.D.   On: 10/27/2022 14:11    Procedures Procedures    Medications Ordered in ED Medications - No data to display  ED Course/ Medical Decision Making/ A&P                             Medical Decision Making Adult female with baseline gait difficulty presents with concern for head pain following a fall.  Patient's initial neuroexam is generally reassuring, but given her age, differential includes fracture versus intracranial abnormality including bruise versus hemorrhage, CT ordered.   Amount and/or Complexity of Data Reviewed Labs: ordered. Decision-making details documented in ED Course.    Details: Urinalysis unremarkable Radiology: ordered and independent interpretation performed. Decision-making details documented in ED Course.  Risk OTC drugs. Decision regarding hospitalization.   Update: Patient in no distress, no new complaints, CT reviewed, urinalysis reviewed, neither with notable  findings.  In essence this adult female presents after mechanical fall.  She is neurologically unchanged, is in no distress, findings are reassuring, no evidence for cranial hemorrhage, fracture,, patient discharged in stable condition.        Final Clinical Impression(s) / ED Diagnoses Final diagnoses:  Fall, initial encounter  Injury of head, initial encounter    Rx / DC Orders ED Discharge Orders     None         Gerhard Munch, MD 10/27/22 1417

## 2022-10-27 NOTE — ED Notes (Signed)
Report given to [employer]

## 2022-11-30 ENCOUNTER — Other Ambulatory Visit (HOSPITAL_COMMUNITY): Payer: Self-pay | Admitting: Family Medicine

## 2022-11-30 DIAGNOSIS — Z1231 Encounter for screening mammogram for malignant neoplasm of breast: Secondary | ICD-10-CM

## 2022-12-20 DIAGNOSIS — Z79899 Other long term (current) drug therapy: Secondary | ICD-10-CM | POA: Insufficient documentation

## 2022-12-20 DIAGNOSIS — M899 Disorder of bone, unspecified: Secondary | ICD-10-CM | POA: Insufficient documentation

## 2022-12-20 DIAGNOSIS — G894 Chronic pain syndrome: Secondary | ICD-10-CM

## 2022-12-20 DIAGNOSIS — Z789 Other specified health status: Secondary | ICD-10-CM | POA: Insufficient documentation

## 2022-12-20 HISTORY — DX: Chronic pain syndrome: G89.4

## 2022-12-20 NOTE — Patient Instructions (Signed)
____________________________________________________________________________________________  New Patients  Welcome to [facility].   Initial Visit The first or initial visit consists of an evaluation only.   Interventional pain management.  We offer therapies other than opioid controlled substances to manage chronic pain. These include, but are not limited to, diagnostic, therapeutic, and palliative specialized injection therapies (i.e.: Epidural Steroids, Facet Blocks, etc.). We specialize in a variety of nerve blocks as well as radiofrequency treatments. We offer pain implant evaluations and trials, as well as follow up management. In addition we also provide a variety joint injections, including Viscosupplementation (AKA: Gel Therapy).  Prescription Pain Medication. We specialize in alternatives to opioids. We can provide evaluations and recommendations for/of pharmacologic therapies based on CDC Guidelines.  We no longer take patients for long-term medication management. We will not be taking over your pain medications.  ____________________________________________________________________________________________    ____________________________________________________________________________________________  Patient Information update  To: All of our patients.  Re: Name change.  It has been made official that our current name, "[facility]"   will soon be changed to "[facility]".   The purpose of this change is to eliminate any confusion created by the concept of our practice being a "Medication Management Pain Clinic". In the past this has led to the misconception that we treat pain primarily by the use of prescription medications.  Nothing can be farther from the truth.   Understanding PAIN MANAGEMENT: To  further understand what our practice does, you first have to understand that "Pain Management" is a subspecialty that requires additional training once a physician has completed their specialty training, which can be in either Anesthesia, Neurology, Psychiatry, or Physical Medicine and Rehabilitation (PMR). Each one of these contributes to the final approach taken by each physician to the management of their patient's pain. To be a "Pain Management Specialist" you must have first completed one of the specialty trainings below.  Anesthesiologists - trained in clinical pharmacology and interventional techniques such as nerve blockade and regional as well as central neuroanatomy. They are trained to block pain before, during, and after surgical interventions.  Neurologists - trained in the diagnosis and pharmacological treatment of complex neurological conditions, such as Multiple Sclerosis, Parkinson's, spinal cord injuries, and other systemic conditions that may be associated with symptoms that may include but are not limited to pain. They tend to rely primarily on the treatment of chronic pain using prescription medications.  Psychiatrist - trained in conditions affecting the psychosocial wellbeing of patients including but not limited to depression, anxiety, schizophrenia, personality disorders, addiction, and other substance use disorders that may be associated with chronic pain. They tend to rely primarily on the treatment of chronic pain using prescription medications.   Physical Medicine and Rehabilitation (PMR) physicians, also known as physiatrists - trained to treat a wide variety of medical conditions affecting the brain, spinal cord, nerves, bones, joints, ligaments, muscles, and tendons. Their training is primarily aimed at treating patients that have suffered injuries that have caused severe physical impairment. Their training is primarily aimed at the physical therapy and rehabilitation of those  patients. They may also work alongside orthopedic surgeons or neurosurgeons using their expertise in assisting surgical patients to recover after their surgeries.  INTERVENTIONAL PAIN MANAGEMENT is sub-subspecialty of Pain Management.  Our physicians are Board-certified in Anesthesia, Pain Management, and Interventional Pain Management.  This meaning that not only have they been trained   and Board-certified in their specialty of Anesthesia, and subspecialty of Pain Management, but they have also received further training in the sub-subspecialty of Interventional Pain Management, in order to become Board-certified as INTERVENTIONAL PAIN MANAGEMENT SPECIALIST.    Mission: Our goal is to use our skills in  INTERVENTIONAL PAIN MANAGEMENT as alternatives to the chronic use of prescription opioid medications for the treatment of pain. To make this more clear, we have changed our name to reflect what we do and offer. We will continue to offer medication management assessment and recommendations, but we will not be taking over any patient's medication management.  ____________________________________________________________________________________________     

## 2022-12-20 NOTE — Progress Notes (Unsigned)
Patient: Valerie [name]  Service Category: E/M  Provider: Oswaldo Done, MD  DOB: 1960-08-26  DOS: 12/21/2022  Referring Provider: Rocco Pauls, FNP  MRN: [id number]  Setting: Ambulatory outpatient  PCP: System, Provider Not In  Type: New Patient  Specialty: Interventional Pain Management    Location: Office  Delivery: Face-to-face     Primary Reason(s) for Visit: Encounter for initial evaluation of one or more chronic problems (new to examiner) potentially causing chronic pain, and posing a threat to normal musculoskeletal function. (Level of risk: High) CC: No chief complaint on file.  HPI  Valerie [name] is a 62 y.o. year old, female patient, who comes for the first time to our practice referred by Rocco Pauls, FNP for our initial evaluation of her chronic pain. She has Diarrhea; IBS (irritable bowel syndrome); Essential hypertension; Pure hypercholesterolemia; Schizophrenia (HCC); Chronic pain syndrome; Pharmacologic therapy; Disorder of skeletal system; and Problems influencing health status on their problem list. Today she comes in for evaluation of her No chief complaint on file.  Pain Assessment: Location:     Radiating:   Onset:   Duration:   Quality:   Severity:  /10 (subjective, self-reported pain score)  Effect on ADL:   Timing:   Modifying factors:   BP:    HR:    Onset and Duration: {Hx; Onset and Duration:210120511} Cause of pain: {Hx; Cause:210120521} Severity: {Pain Severity:210120502} Timing: {Symptoms; Timing:210120501} Aggravating Factors: {Causes; Aggravating pain factors:210120507} Alleviating Factors: {Causes; Alleviating Factors:210120500} Associated Problems: {Hx; Associated problems:210120515} Quality of Pain: {Hx; Symptom quality or Descriptor:210120531} Previous Examinations or Tests: {Hx; Previous examinations or test:210120529} Previous Treatments: {Hx; Previous Treatment:210120503}  Valerie [name] is being evaluated for possible interventional pain management  therapies for the treatment of her chronic pain.   ***  Valerie [name] has been informed that this initial visit was an evaluation only.  On the follow up appointment I will go over the results, including ordered tests and available interventional therapies. At that time she will have the opportunity to decide whether to proceed with offered therapies or not. In the event that Valerie [name] prefers avoiding interventional options, this will conclude our involvement in the case.  Medication management recommendations may be provided upon request.  Historic Controlled Substance Pharmacotherapy Review  PMP and historical list of controlled substances: ***  Most recently prescribed opioid analgesics:   *** MME/day: *** mg/day  Historical Monitoring: The patient  reports no history of drug use. List of prior UDS Testing: No results found for: "MDMA", "COCAINSCRNUR", "PCPSCRNUR", "PCPQUANT", "CANNABQUANT", "THCU", "ETH", "CBDTHCR", "D8THCCBX", "D9THCCBX" Historical Background Evaluation: [place] PMP: PDMP reviewed during this encounter. Review of the past 59-months conducted.             PMP NARX Score Report:  Narcotic: *** Sedative: *** Stimulant: *** [place] Department of public safety, offender search: Engineer, mining Information) Non-contributory Risk Assessment Profile: Aberrant behavior: None observed or detected today Risk factors for fatal opioid overdose: None identified today PMP NARX Overdose Risk Score: *** Fatal overdose hazard ratio (HR): Calculation deferred Non-fatal overdose hazard ratio (HR): Calculation deferred Risk of opioid abuse or dependence: 0.7-3.0% with doses ? 36 MME/day and 6.1-26% with doses ? 120 MME/day. Substance use disorder (SUD) risk level: See below Personal History of Substance Abuse (SUD-Substance use disorder):  Alcohol:    Illegal Drugs:    Rx Drugs:    ORT Risk Level calculation:    ORT Scoring interpretation table:  Score <3 = Low Risk for SUD  Score between  4-7 =  Moderate Risk for SUD  Score >8 = High Risk for Opioid Abuse   PHQ-2 Depression Scale:  Total score:    PHQ-2 Scoring interpretation table: (Score and probability of major depressive disorder)  Score 0 = No depression  Score 1 = 15.4% Probability  Score 2 = 21.1% Probability  Score 3 = 38.4% Probability  Score 4 = 45.5% Probability  Score 5 = 56.4% Probability  Score 6 = 78.6% Probability   PHQ-9 Depression Scale:  Total score:    PHQ-9 Scoring interpretation table:  Score 0-4 = No depression  Score 5-9 = Mild depression  Score 10-14 = Moderate depression  Score 15-19 = Moderately severe depression  Score 20-27 = Severe depression (2.4 times higher risk of SUD and 2.89 times higher risk of overuse)   Pharmacologic Plan: As per protocol, I have not taken over any controlled substance management, pending the results of ordered tests and/or consults.            Initial impression: Pending review of available data and ordered tests.  Meds   Current Outpatient Medications:    benztropine (COGENTIN) 0.5 MG tablet, Take by mouth., Disp: , Rfl:    diclofenac Sodium (VOLTAREN) 1 % GEL, SMARTSIG:2 Topical Twice Daily, Disp: , Rfl:    fluticasone (FLONASE) 50 MCG/ACT nasal spray, Place into both nostrils., Disp: , Rfl:    gabapentin (NEURONTIN) 300 MG capsule, 1 PO QHS x 5 days then 1 PO BID x 5 days then 1 PO TID if tolerated, Disp: , Rfl:    ibuprofen (ADVIL) 600 MG tablet, Take 600 mg by mouth 2 (two) times daily as needed., Disp: , Rfl:    JANUVIA 100 MG tablet, Take 100 mg by mouth daily., Disp: , Rfl:    LAGEVRIO 200 MG CAPS capsule, Take by mouth., Disp: , Rfl:    lisinopril (ZESTRIL) 30 MG tablet, Take 30 mg by mouth daily., Disp: , Rfl:    metFORMIN (GLUCOPHAGE) 1000 MG tablet, Take 1,000 mg by mouth daily., Disp: , Rfl:    potassium chloride SA (KLOR-CON M) 20 MEQ tablet, Take 20 mEq by mouth 2 (two) times daily., Disp: , Rfl:    rosuvastatin (CRESTOR) 5 MG tablet, Take 5 mg by  mouth daily., Disp: , Rfl:    busPIRone (BUSPAR) 10 MG tablet, Take 10 mg by mouth 2 (two) times daily., Disp: , Rfl:    busPIRone (BUSPAR) 5 MG tablet, Take 5 mg by mouth at bedtime., Disp: , Rfl:    cholecalciferol (VITAMIN D3) 25 MCG (1000 UNIT) tablet, Take 1,000 Units by mouth daily., Disp: , Rfl:    cloZAPine (CLOZARIL) 100 MG tablet, Take 200 mg by mouth 2 (two) times daily., Disp: , Rfl:    hydrochlorothiazide (HYDRODIURIL) 25 MG tablet, Take 25 mg by mouth daily., Disp: , Rfl:    lisinopril (ZESTRIL) 40 MG tablet, Take 40 mg by mouth daily., Disp: , Rfl:    loperamide (IMODIUM A-D) 2 MG tablet, , Disp: , Rfl:    loperamide (IMODIUM) 2 MG capsule, Take 2 mg by mouth as needed for diarrhea or loose stools. Take 1 capsule by mouth as needed with each loose stool/diarrhea nte 8 doses in 24 hr, Disp: , Rfl:    metFORMIN (GLUCOPHAGE) 500 MG tablet, Take 1,000 mg by mouth 2 (two) times daily with a meal., Disp: , Rfl:    Multiple Vitamin (DAILY VITE PO), Take 1 tablet by mouth daily., Disp: , Rfl:  naproxen (NAPROSYN) 500 MG tablet, Take 500 mg by mouth every 12 (twelve) hours as needed., Disp: , Rfl:    omeprazole (PRILOSEC) 20 MG capsule, Take 20 mg by mouth daily., Disp: , Rfl:    ondansetron (ZOFRAN) 4 MG tablet, Take 4 mg by mouth every 8 (eight) hours as needed for nausea or vomiting., Disp: , Rfl:    ondansetron (ZOFRAN-ODT) 4 MG disintegrating tablet, , Disp: , Rfl:    propranolol (INDERAL) 40 MG tablet, Take 40 mg by mouth 2 (two) times daily., Disp: , Rfl:    propranolol ER (INDERAL LA) 80 MG 24 hr capsule, , Disp: , Rfl:    sitaGLIPtin (JANUVIA) 50 MG tablet, Take 100 mg by mouth daily. , Disp: , Rfl:   Imaging Review  Lumbosacral Imaging: Lumbar MR wo contrast: Results for orders placed during the hospital encounter of 07/30/22 MR LUMBAR SPINE WO CONTRAST  Narrative CLINICAL DATA:  Chronic bilateral low back pain with right-sided sciatica  EXAM: MRI LUMBAR SPINE WITHOUT  CONTRAST  TECHNIQUE: Multiplanar, multisequence MR imaging of the lumbar spine was performed. No intravenous contrast was administered.  COMPARISON:  None Available.  FINDINGS: Segmentation:  5 lumbar type vertebral bodies.  Alignment: 3 mm retrolisthesis L2 on L3 and L3 on L4. 3 mm anterolisthesis L5 on S1. Mild levocurvature of the lumbar spine.  Vertebrae:  No fracture, evidence of discitis, or bone lesion.  Conus medullaris and cauda equina: Conus extends to the L1-L2 level. Conus and cauda equina appear normal.  Paraspinal and other soft tissues: Negative.  Disc levels:  T12-L1: Seen only on the sagittal images. No significant disc bulge, spinal canal stenosis, or neural foraminal narrowing.  L1-L2: Central/right paracentral disc extrusion with 6 mm of caudal migration. This indents the thecal sac but does not cause significant spinal canal stenosis. No neural foraminal narrowing.  L2-L3: Trace retrolisthesis and mild disc bulge. Moderate facet arthropathy. Moderate spinal canal stenosis. Effacement of the lateral recesses. Mild left and moderate to severe right neural foraminal narrowing.  L3-L4: Trace retrolisthesis and minimal disc bulge. Mild-to-moderate facet arthropathy. No spinal canal stenosis or neural foraminal narrowing.  L4-L5: Mild disc bulge. Severe facet arthropathy. Mild spinal canal stenosis. Narrowing of the lateral recesses. Mild left and moderate right neural foraminal narrowing.  L5-S1: Trace anterolisthesis with disc unroofing. Severe facet arthropathy. No spinal canal stenosis. Moderate right and moderate to severe left neural foraminal narrowing.  IMPRESSION: 1. L2-L3 moderate spinal canal stenosis with moderate to severe right and mild left neural foraminal narrowing. Effacement of the lateral recesses at this level likely compresses the descending L3 nerve roots. 2. L4-L5 mild spinal canal stenosis with moderate right and mild left  neural foraminal narrowing. Narrowing of the lateral recesses at this level could affect the descending L5 nerve roots. 3. L5-S1 moderate to severe left and moderate right neural foraminal narrowing.   Electronically Signed By: Wiliam Ke M.D. On: 07/31/2022 02:20  Lumbar DG (Complete) 4+V: Results for orders placed in visit on 06/03/22 DG Lumbar Spine Complete  Narrative Clinical:  lower back pain, right sided paresthesias.  No trauma  X-rays were done of the lumbar spine, five views.  There id diffuse degenerative changes of the lumbar spine.  Lordosis is maintained.  There is narrowing at L2-L3 and anterior osteophytes at L1, L2, L3 and L4.  No fracture is noted.  Bone quality is good.  Impression:  diffuse degenerative changes of lumbar spine, no acute findings.  Electronically Signed Deniece Portela  Hilda Lias, MD 11/22/202310:47 AM  Complexity Note: Imaging results reviewed.                         ROS  Cardiovascular: {Hx; Cardiovascular History:210120525} Pulmonary or Respiratory: {Hx; Pumonary and/or Respiratory History:210120523} Neurological: {Hx; Neurological:210120504} Psychological-Psychiatric: {Hx; Psychological-Psychiatric History:210120512} Gastrointestinal: {Hx; Gastrointestinal:210120527} Genitourinary: {Hx; Genitourinary:210120506} Hematological: {Hx; Hematological:210120510} Endocrine: {Hx; Endocrine history:210120509} Rheumatologic: {Hx; Rheumatological:210120530} Musculoskeletal: {Hx; Musculoskeletal:210120528} Work History: {Hx; Work history:210120514}  Allergies  Valerie [name] is allergic to other and risperidone and related.  Laboratory Chemistry Profile   Renal Lab Results  Component Value Date   BUN 9 05/24/2022   CREATININE 0.69 05/24/2022   GFRNONAA >60 05/24/2022   PROTEINUR NEGATIVE 10/27/2022     Electrolytes Lab Results  Component Value Date   NA 137 05/24/2022   K 3.4 (L) 05/24/2022   CL 101 05/24/2022   CALCIUM 9.3 05/24/2022      Hepatic Lab Results  Component Value Date   AST 17 05/24/2022   ALT 16 05/24/2022   ALBUMIN 3.8 05/24/2022   ALKPHOS 114 05/24/2022   LIPASE 32 05/24/2022     ID No results found for: "LYMEIGGIGMAB", "HIV", "SARSCOV2NAA", "STAPHAUREUS", "MRSAPCR", "HCVAB", "PREGTESTUR", "RMSFIGG", "QFVRPH1IGG", "QFVRPH2IGG"   Bone No results found for: "VD25OH", "VD125OH2TOT", "ZO1096EA5", "WU9811BJ4", "25OHVITD1", "25OHVITD2", "25OHVITD3", "TESTOFREE", "TESTOSTERONE"   Endocrine Lab Results  Component Value Date   GLUCOSE 148 (H) 05/24/2022   GLUCOSEU NEGATIVE 10/27/2022     Neuropathy No results found for: "VITAMINB12", "FOLATE", "HGBA1C", "HIV"   CNS No results found for: "COLORCSF", "APPEARCSF", "RBCCOUNTCSF", "WBCCSF", "POLYSCSF", "LYMPHSCSF", "EOSCSF", "PROTEINCSF", "GLUCCSF", "JCVIRUS", "CSFOLI", "IGGCSF", "LABACHR", "ACETBL"   Inflammation (CRP: Acute  ESR: Chronic) No results found for: "CRP", "ESRSEDRATE", "LATICACIDVEN"   Rheumatology No results found for: "RF", "ANA", "LABURIC", "URICUR", "LYMEIGGIGMAB", "LYMEABIGMQN", "HLAB27"   Coagulation Lab Results  Component Value Date   PLT 272 05/24/2022     Cardiovascular Lab Results  Component Value Date   HGB 13.0 05/24/2022   HCT 39.6 05/24/2022     Screening No results found for: "SARSCOV2NAA", "COVIDSOURCE", "STAPHAUREUS", "MRSAPCR", "HCVAB", "HIV", "PREGTESTUR"   Cancer No results found for: "CEA", "CA125", "LABCA2"   Allergens No results found for: "ALMOND", "APPLE", "ASPARAGUS", "AVOCADO", "BANANA", "BARLEY", "BASIL", "BAYLEAF", "GREENBEAN", "LIMABEAN", "WHITEBEAN", "BEEFIGE", "REDBEET", "BLUEBERRY", "BROCCOLI", "CABBAGE", "MELON", "CARROT", "CASEIN", "CASHEWNUT", "CAULIFLOWER", "CELERY"     Note: Lab results reviewed.  PFSH  Drug: Valerie [name]  reports no history of drug use. Alcohol:  reports no history of alcohol use. Tobacco:  reports that she has never smoked. She has never used smokeless tobacco. Medical:   has a past medical history of HTN (hypertension), Hypercholesteremia, Prediabetes, and Schizophrenia (HCC). Family: family history is not on file.  Past Surgical History:  Procedure Laterality Date   COLONOSCOPY  2014   Dr. Allena Katz: Pancolonic diverticulosis, tortuous colon, next colonoscopy 2024   TONSILLECTOMY     Active Ambulatory Problems    Diagnosis Date Noted   Diarrhea 01/18/2019   IBS (irritable bowel syndrome) 04/25/2019   Essential hypertension 03/17/2005   Pure hypercholesterolemia 03/17/2005   Schizophrenia (HCC) 03/17/2005   Chronic pain syndrome 12/20/2022   Pharmacologic therapy 12/20/2022   Disorder of skeletal system 12/20/2022   Problems influencing health status 12/20/2022   Resolved Ambulatory Problems    Diagnosis Date Noted   No Resolved Ambulatory Problems   Past Medical History:  Diagnosis Date   HTN (hypertension)    Hypercholesteremia    Prediabetes    Constitutional  Exam  General appearance: Well nourished, well developed, and well hydrated. In no apparent acute distress There were no vitals filed for this visit. BMI Assessment: Estimated body mass index is 38.76 kg/m as calculated from the following:   Height as of 07/20/22: 5\' 4"  (1.626 m).   Weight as of 07/20/22: 225 lb 12.8 oz (102.4 kg).  BMI interpretation table: BMI level Category Range association with higher incidence of chronic pain  <18 kg/m2 Underweight   18.5-24.9 kg/m2 Ideal body weight   25-29.9 kg/m2 Overweight Increased incidence by 20%  30-34.9 kg/m2 Obese (Class I) Increased incidence by 68%  35-39.9 kg/m2 Severe obesity (Class II) Increased incidence by 136%  >40 kg/m2 Extreme obesity (Class III) Increased incidence by 254%   Patient's current BMI Ideal Body weight  There is no height or weight on file to calculate BMI. Patient weight not recorded   BMI Readings from Last 4 Encounters:  07/20/22 38.76 kg/m  05/24/22 39.01 kg/m  12/02/21 39.03 kg/m  02/03/21 43.08  kg/m   Wt Readings from Last 4 Encounters:  07/20/22 225 lb 12.8 oz (102.4 kg)  05/24/22 227 lb 4.7 oz (103.1 kg)  12/02/21 227 lb 6.4 oz (103.1 kg)  02/03/21 243 lb 3.2 oz (110.3 kg)    Psych/Mental status: Alert, oriented x 3 (person, place, & time)       Eyes: PERLA Respiratory: No evidence of acute respiratory distress  Assessment  Primary Diagnosis & Pertinent Problem List: The primary encounter diagnosis was Chronic pain syndrome. Diagnoses of Pharmacologic therapy, Disorder of skeletal system, and Problems influencing health status were also pertinent to this visit.  Visit Diagnosis (New problems to examiner): 1. Chronic pain syndrome   2. Pharmacologic therapy   3. Disorder of skeletal system   4. Problems influencing health status    Plan of Care (Initial workup plan)  Note: Valerie [name] was reminded that as per protocol, today's visit has been an evaluation only. We have not taken over the patient's controlled substance management.  Problem-specific plan: No problem-specific Assessment & Plan notes found for this encounter.  Lab Orders  No laboratory test(s) ordered today   Imaging Orders  No imaging studies ordered today   Referral Orders  No referral(s) requested today   Procedure Orders    No procedure(s) ordered today   Pharmacotherapy (current): Medications ordered:  No orders of the defined types were placed in this encounter.  Medications administered during this visit: Valerie [name] had no medications administered during this visit.   Analgesic Pharmacotherapy:  Opioid Analgesics: For patients currently taking or requesting to take opioid analgesics, in accordance with [facility] Guidelines, we will assess their risks and indications for the use of these substances. After completing our evaluation, we may offer recommendations, but we no longer take patients for medication management. The prescribing physician will ultimately  decide, based on his/her training and level of comfort whether to adopt any of the recommendations, including whether or not to prescribe such medicines.  Membrane stabilizer: To be determined at a later time  Muscle relaxant: To be determined at a later time  NSAID: To be determined at a later time  Other analgesic(s): To be determined at a later time   Interventional management options: Valerie [name] was informed that there is no guarantee that she would be a candidate for interventional therapies. The decision will be based on the results of diagnostic studies, as well as Valerie [name]'s risk profile.  Procedure(s) under consideration:  Pending results of ordered studies      Interventional Therapies  Risk Factors  Considerations:     Planned  Pending:      Under consideration:   Pending   Completed:   None at this time   Therapeutic  Palliative (PRN) options:   None established   Completed by other providers:   None reported       Provider-requested follow-up: No follow-ups on file.  Future Appointments  Date Time Provider Department Center  12/21/2022  9:00 AM Delano Metz, MD [facility] None  01/06/2023  2:00 PM [facility] [place] [facility]    Duration of encounter: *** minutes.  Total time on encounter, as per AMA guidelines included both the face-to-face and non-face-to-face time personally spent by the physician and/or other qualified health care professional(s) on the day of the encounter (includes time in activities that require the physician or other qualified health care professional and does not include time in activities normally performed by clinical staff). Physician's time may include the following activities when performed: Preparing to see the patient (e.g., pre-charting review of records, searching for previously ordered imaging, lab work, and nerve conduction tests) Review of prior analgesic pharmacotherapies. Reviewing PMP Interpreting ordered  tests (e.g., lab work, imaging, nerve conduction tests) Performing post-procedure evaluations, including interpretation of diagnostic procedures Obtaining and/or reviewing separately obtained history Performing a medically appropriate examination and/or evaluation Counseling and educating the patient/family/caregiver Ordering medications, tests, or procedures Referring and communicating with other health care professionals (when not separately reported) Documenting clinical information in the electronic or other health record Independently interpreting results (not separately reported) and communicating results to the patient/ family/caregiver Care coordination (not separately reported)  Note by: Oswaldo Done, MD (TTS technology used. I apologize for any typographical errors that were not detected and corrected.) Date: 12/21/2022; Time: 7:32 PM

## 2022-12-21 ENCOUNTER — Ambulatory Visit
Admission: RE | Admit: 2022-12-21 | Discharge: 2022-12-21 | Disposition: A | Discharge status: Home / Self Care | Payer: Medicare Other | Source: Ambulatory Visit | Attending: Pain Medicine | Admitting: Pain Medicine

## 2022-12-21 ENCOUNTER — Ambulatory Visit (HOSPITAL_BASED_OUTPATIENT_CLINIC_OR_DEPARTMENT_OTHER): Payer: Medicare Other | Admitting: Pain Medicine

## 2022-12-21 ENCOUNTER — Ambulatory Visit
Admission: RE | Admit: 2022-12-21 | Discharge: 2022-12-21 | Disposition: A | Discharge status: Home / Self Care | Payer: Medicare Other | Attending: Pain Medicine | Admitting: Pain Medicine

## 2022-12-21 ENCOUNTER — Encounter: Payer: Self-pay | Admitting: Pain Medicine

## 2022-12-21 VITALS — BP 136/81 | HR 93 | Temp 97.9°F | Resp 18 | Ht 64.0 in | Wt 222.0 lb

## 2022-12-21 DIAGNOSIS — M5137 Other intervertebral disc degeneration, lumbosacral region: Secondary | ICD-10-CM

## 2022-12-21 DIAGNOSIS — Z789 Other specified health status: Secondary | ICD-10-CM

## 2022-12-21 DIAGNOSIS — G894 Chronic pain syndrome: Secondary | ICD-10-CM | POA: Insufficient documentation

## 2022-12-21 DIAGNOSIS — M5441 Lumbago with sciatica, right side: Secondary | ICD-10-CM | POA: Insufficient documentation

## 2022-12-21 DIAGNOSIS — M899 Disorder of bone, unspecified: Secondary | ICD-10-CM

## 2022-12-21 DIAGNOSIS — M51379 Other intervertebral disc degeneration, lumbosacral region without mention of lumbar back pain or lower extremity pain: Secondary | ICD-10-CM

## 2022-12-21 DIAGNOSIS — R937 Abnormal findings on diagnostic imaging of other parts of musculoskeletal system: Secondary | ICD-10-CM | POA: Insufficient documentation

## 2022-12-21 DIAGNOSIS — M4310 Spondylolisthesis, site unspecified: Secondary | ICD-10-CM | POA: Insufficient documentation

## 2022-12-21 DIAGNOSIS — M79604 Pain in right leg: Secondary | ICD-10-CM | POA: Insufficient documentation

## 2022-12-21 DIAGNOSIS — M4317 Spondylolisthesis, lumbosacral region: Secondary | ICD-10-CM | POA: Insufficient documentation

## 2022-12-21 DIAGNOSIS — G8929 Other chronic pain: Secondary | ICD-10-CM | POA: Insufficient documentation

## 2022-12-21 DIAGNOSIS — Z79899 Other long term (current) drug therapy: Secondary | ICD-10-CM | POA: Insufficient documentation

## 2022-12-21 HISTORY — DX: Other intervertebral disc degeneration, lumbosacral region without mention of lumbar back pain or lower extremity pain: M51.379

## 2022-12-21 HISTORY — DX: Other chronic pain: G89.29

## 2022-12-24 ENCOUNTER — Encounter: Payer: Self-pay | Admitting: Gastroenterology

## 2022-12-24 LAB — COMPLIANCE DRUG ANALYSIS, UR

## 2022-12-26 LAB — COMP. METABOLIC PANEL (12)
AST: 18 IU/L (ref 0–40)
Albumin/Globulin Ratio: 1.5
Albumin: 4 g/dL (ref 3.8–4.9)
Alkaline Phosphatase: 134 IU/L — ABNORMAL HIGH (ref 44–121)
BUN/Creatinine Ratio: 13 (ref 12–28)
BUN: 10 mg/dL (ref 8–27)
Bilirubin Total: <0.2 mg/dL (ref 0.0–1.2)
Calcium: 9.6 mg/dL (ref 8.7–10.3)
Chloride: 100 mmol/L (ref 96–106)
Creatinine, Ser: 0.76 mg/dL (ref 0.57–1.00)
Globulin, Total: 2.7 g/dL (ref 1.5–4.5)
Glucose: 195 mg/dL — ABNORMAL HIGH (ref 70–99)
Potassium: 4.5 mmol/L (ref 3.5–5.2)
Sodium: 140 mmol/L (ref 134–144)
Total Protein: 6.7 g/dL (ref 6.0–8.5)
eGFR: 90 mL/min/{1.73_m2} (ref >59)

## 2022-12-26 LAB — MAGNESIUM: Magnesium: 2.1 mg/dL (ref 1.6–2.3)

## 2022-12-26 LAB — 25-HYDROXY VITAMIN D LCMS D2+D3
25-Hydroxy, Vitamin D-2: <1 ng/mL
25-Hydroxy, Vitamin D-3: 44 ng/mL
25-Hydroxy, Vitamin D: 44 ng/mL

## 2022-12-26 LAB — C-REACTIVE PROTEIN: CRP: 6 mg/L (ref 0–10)

## 2022-12-26 LAB — VITAMIN B12: Vitamin B-12: 764 pg/mL (ref 232–1245)

## 2022-12-26 LAB — SEDIMENTATION RATE: Sed Rate: 39 mm/hr (ref 0–40)

## 2023-01-06 ENCOUNTER — Ambulatory Visit (HOSPITAL_COMMUNITY)
Admission: RE | Admit: 2023-01-06 | Discharge: 2023-01-06 | Disposition: A | Discharge status: Home / Self Care | Payer: Medicare Other | Source: Ambulatory Visit | Attending: Family Medicine | Admitting: Family Medicine

## 2023-01-06 DIAGNOSIS — Z1231 Encounter for screening mammogram for malignant neoplasm of breast: Secondary | ICD-10-CM | POA: Diagnosis present

## 2023-01-11 NOTE — Progress Notes (Unsigned)
PROVIDER NOTE: Information contained herein reflects review and annotations entered in association with encounter. Interpretation of such information and data should be left to medically-trained personnel. Information provided to patient can be located elsewhere in the medical record under "Patient Instructions". Document created using STT-dictation technology, any transcriptional errors that may result from process are unintentional.    Patient: Valerie [name]  Service Category: E/M  Provider: Oswaldo Done, MD  DOB: 1960-09-21  DOS: 01/13/2023  Referring Provider: No ref. provider found  MRN: [id number]  Specialty: Interventional Pain Management  PCP: System, Provider Not In  Type: Established Patient  Setting: Ambulatory outpatient    Location: Office  Delivery: Face-to-face     Primary Reason(s) for Visit: Encounter for evaluation before starting new chronic pain management plan of care (Level of risk: moderate) CC: No chief complaint on file.  HPI  Valerie [name] is a 62 y.o. year old, female patient, who comes today for a follow-up evaluation to review the test results and decide on a treatment plan. She has Diarrhea; IBS (irritable bowel syndrome); Essential hypertension; Pure hypercholesterolemia; Schizophrenia (HCC); Chronic pain syndrome; Pharmacologic therapy; Disorder of skeletal system; Problems influencing health status; Chronic low back pain (1ry area of Pain) (Bilateral) (R>L) w/ sciatica (Right); Chronic lower extremity pain (2ry area of Pain) (Right); DDD (degenerative disc disease), lumbosacral; Abnormal MRI, lumbar spine (07/31/2022); Retrolisthesis; and Anterolisthesis of lumbosacral spine on their problem list. Her primarily concern today is the No chief complaint on file.  Pain Assessment: Location:     Radiating:   Onset:   Duration:   Quality:   Severity:  /10 (subjective, self-reported pain score)  Effect on ADL:   Timing:   Modifying factors:   BP:    HR:    Ms.  Neitz comes in today for a follow-up visit after her initial evaluation on 12/21/2022. Today we went over the results of her tests. These were explained in "Layman's terms". During today's appointment we went over my diagnostic impression, as well as the proposed treatment plan.  ***  Patient presented with interventional treatment options. Valerie [name] was informed that I will not be providing medication management. Pharmacotherapy evaluation including recommendations may be offered, if specifically requested.   Controlled Substance Pharmacotherapy Assessment REMS (Risk Evaluation and Mitigation Strategy)  Opioid Analgesic: None MME/day: 0 mg/day  Pill Count: None expected due to no prior prescriptions written by our practice. No notes on file Pharmacokinetics: Liberation and absorption (onset of action): WNL Distribution (time to peak effect): WNL Metabolism and excretion (duration of action): WNL         Pharmacodynamics: Desired effects: Analgesia: Ms. Shannon reports >50% benefit. Functional ability: Patient reports that medication allows her to accomplish basic ADLs Clinically meaningful improvement in function (CMIF): Sustained CMIF goals met Perceived effectiveness: Described as relatively effective, allowing for increase in activities of daily living (ADL) Undesirable effects: Side-effects or Adverse reactions: None reported Monitoring: [place] PMP: PDMP reviewed during this encounter. Online review of the past 7-month period previously conducted. Not applicable at this point since we have not taken over the patient's medication management yet. List of other Serum/Urine Drug Screening Test(s):  No results found for: "AMPHSCRSER", "BARBSCRSER", "BENZOSCRSER", "COCAINSCRSER", "COCAINSCRNUR", "PCPSCRSER", "THCSCRSER", "THCU", "CANNABQUANT", "OPIATESCRSER", "OXYSCRSER", "PROPOXSCRSER", "ETH", "CBDTHCR", "D8THCCBX", "D9THCCBX" List of all UDS test(s) done:  Lab Results  Component Value  Date   SUMMARY Note 12/21/2022   Last UDS on record: Summary  Date Value Ref Range Status  12/21/2022 Note  Final  Comment:    ==================================================================== Compliance Drug Analysis, Ur ==================================================================== Test                             Result       Flag       Units  Drug Present and Declared for Prescription Verification   Gabapentin                     PRESENT      EXPECTED   Clozapine                      PRESENT      EXPECTED   Desmethylclozapine             PRESENT      EXPECTED    Desmethylclozapine is an expected metabolite of clozapine.    Naproxen                       PRESENT      EXPECTED   Propranolol                    PRESENT      EXPECTED  Drug Present not Declared for Prescription Verification   Acetaminophen                  PRESENT      UNEXPECTED  Drug Absent but Declared for Prescription Verification   Ibuprofen                      Not Detected UNEXPECTED    Ibuprofen, as indicated in the declared medication list, is not    always detected even when used as directed.    Benztropine                    Not Detected UNEXPECTED   Clonidine                      Not Detected UNEXPECTED ==================================================================== Test                      Result    Flag   Units      Ref Range   Creatinine              40               mg/dL      >=16 ==================================================================== Declared Medications:  The flagging and interpretation on this report are based on the  following declared medications.  Unexpected results may arise from  inaccuracies in the declared medications.   **Note: The testing scope of this panel includes these medications:   Benztropine (Cogentin)  Clonidine (Catapres)  Clozapine (Clozaril)  Gabapentin (Neurontin)  Naproxen (Naprosyn)  Propranolol (Inderal)   **Note: The testing  scope of this panel does not include small to  moderate amounts of these reported medications:   Ibuprofen (Advil)   **Note: The testing scope of this panel does not include the  following reported medications:   Buspirone (Buspar)  Hydrochlorothiazide  Lisinopril (Zestril)  Loperamide (Imodium)  Metformin (Glucophage)  Multivitamin  Omeprazole (Prilosec)  Ondansetron (Zofran)  Potassium (Klor-Con)  Rosuvastatin (Crestor)  Sitagliptin (Januvia)  Vitamin D3 ==================================================================== For clinical consultation, please call [phone]. ====================================================================    UDS interpretation: No unexpected findings.  Medication Assessment Form: Not applicable. No opioids. Treatment compliance: Not applicable Risk Assessment Profile: Aberrant behavior: See initial evaluations. None observed or detected today Comorbid factors increasing risk of overdose: See initial evaluation. No additional risks detected today Opioid risk tool (ORT):     12/21/2022    9:00 AM  Opioid Risk   Alcohol 0  Illegal Drugs 0  Rx Drugs 0  Alcohol 0  Illegal Drugs 0  Rx Drugs 0  Age between 16-45 years  0  History of Preadolescent Sexual Abuse 0  Psychological Disease 0  Depression 0  Opioid Risk Tool Scoring 0  Opioid Risk Interpretation Low Risk    ORT Scoring interpretation table:  Score <3 = Low Risk for SUD  Score between 4-7 = Moderate Risk for SUD  Score >8 = High Risk for Opioid Abuse   Risk of substance use disorder (SUD): Low  Risk Mitigation Strategies:  Patient opioid safety counseling: No controlled substances prescribed. Patient-Prescriber Agreement (PPA): No agreement signed.  Controlled substance notification to other providers: None required. No opioid therapy.  Pharmacologic Plan: Non-opioid analgesic therapy offered. Interventional alternatives discussed.             Laboratory  Chemistry Profile   Renal Lab Results  Component Value Date   BUN 10 12/21/2022   CREATININE 0.76 12/21/2022   BCR 13 12/21/2022   GFRNONAA >60 05/24/2022   PROTEINUR NEGATIVE 10/27/2022     Electrolytes Lab Results  Component Value Date   NA 140 12/21/2022   K 4.5 12/21/2022   CL 100 12/21/2022   CALCIUM 9.6 12/21/2022   MG 2.1 12/21/2022     Hepatic Lab Results  Component Value Date   AST 18 12/21/2022   ALT 16 05/24/2022   ALBUMIN 4.0 12/21/2022   ALKPHOS 134 (H) 12/21/2022   LIPASE 32 05/24/2022     ID No results found for: "LYMEIGGIGMAB", "HIV", "SARSCOV2NAA", "STAPHAUREUS", "MRSAPCR", "HCVAB", "PREGTESTUR", "RMSFIGG", "QFVRPH1IGG", "QFVRPH2IGG"   Bone Lab Results  Component Value Date   25OHVITD1 44 12/21/2022   25OHVITD2 <1.0 12/21/2022   25OHVITD3 44 12/21/2022     Endocrine Lab Results  Component Value Date   GLUCOSE 195 (H) 12/21/2022   GLUCOSEU NEGATIVE 10/27/2022     Neuropathy Lab Results  Component Value Date   VITAMINB12 764 12/21/2022     CNS No results found for: "COLORCSF", "APPEARCSF", "RBCCOUNTCSF", "WBCCSF", "POLYSCSF", "LYMPHSCSF", "EOSCSF", "PROTEINCSF", "GLUCCSF", "JCVIRUS", "CSFOLI", "IGGCSF", "LABACHR", "ACETBL"   Inflammation (CRP: Acute  ESR: Chronic) Lab Results  Component Value Date   CRP 6 12/21/2022   ESRSEDRATE 39 12/21/2022     Rheumatology No results found for: "RF", "ANA", "LABURIC", "URICUR", "LYMEIGGIGMAB", "LYMEABIGMQN", "HLAB27"   Coagulation Lab Results  Component Value Date   PLT 272 05/24/2022     Cardiovascular Lab Results  Component Value Date   HGB 13.0 05/24/2022   HCT 39.6 05/24/2022     Screening No results found for: "SARSCOV2NAA", "COVIDSOURCE", "STAPHAUREUS", "MRSAPCR", "HCVAB", "HIV", "PREGTESTUR"   Cancer No results found for: "CEA", "CA125", "LABCA2"   Allergens No results found for: "ALMOND", "APPLE", "ASPARAGUS", "AVOCADO", "BANANA", "BARLEY", "BASIL", "BAYLEAF", "GREENBEAN",  "LIMABEAN", "WHITEBEAN", "BEEFIGE", "REDBEET", "BLUEBERRY", "BROCCOLI", "CABBAGE", "MELON", "CARROT", "CASEIN", "CASHEWNUT", "CAULIFLOWER", "CELERY"     Note: Lab results reviewed.  Recent Diagnostic Imaging Review  Cervical Imaging: Cervical MR wo contrast: No results found for this or any previous visit.  Cervical MR wo contrast: No valid procedures specified. Cervical CT wo contrast: No results found for this or  any previous visit.  Cervical DG Bending/F/E views: No results found for this or any previous visit.   Shoulder Imaging: Shoulder-R MR wo contrast: No results found for this or any previous visit.  Shoulder-L MR wo contrast: No results found for this or any previous visit.  Shoulder-R DG: No results found for this or any previous visit.  Shoulder-L DG: No results found for this or any previous visit.   Thoracic Imaging: Thoracic MR wo contrast: No results found for this or any previous visit.  Thoracic MR wo contrast: No valid procedures specified. Thoracic CT wo contrast: No results found for this or any previous visit.  Thoracic DG 4 views: No results found for this or any previous visit.  Thoracic DG w/swimmers view: No results found for this or any previous visit.   Lumbosacral Imaging: Lumbar MR wo contrast: Results for orders placed during the hospital encounter of 07/30/22  MR LUMBAR SPINE WO CONTRAST  Narrative CLINICAL DATA:  Chronic bilateral low back pain with right-sided sciatica  EXAM: MRI LUMBAR SPINE WITHOUT CONTRAST  TECHNIQUE: Multiplanar, multisequence MR imaging of the lumbar spine was performed. No intravenous contrast was administered.  COMPARISON:  None Available.  FINDINGS: Segmentation:  5 lumbar type vertebral bodies.  Alignment: 3 mm retrolisthesis L2 on L3 and L3 on L4. 3 mm anterolisthesis L5 on S1. Mild levocurvature of the lumbar spine.  Vertebrae:  No fracture, evidence of discitis, or bone lesion.  Conus  medullaris and cauda equina: Conus extends to the L1-L2 level. Conus and cauda equina appear normal.  Paraspinal and other soft tissues: Negative.  Disc levels:  T12-L1: Seen only on the sagittal images. No significant disc bulge, spinal canal stenosis, or neural foraminal narrowing.  L1-L2: Central/right paracentral disc extrusion with 6 mm of caudal migration. This indents the thecal sac but does not cause significant spinal canal stenosis. No neural foraminal narrowing.  L2-L3: Trace retrolisthesis and mild disc bulge. Moderate facet arthropathy. Moderate spinal canal stenosis. Effacement of the lateral recesses. Mild left and moderate to severe right neural foraminal narrowing.  L3-L4: Trace retrolisthesis and minimal disc bulge. Mild-to-moderate facet arthropathy. No spinal canal stenosis or neural foraminal narrowing.  L4-L5: Mild disc bulge. Severe facet arthropathy. Mild spinal canal stenosis. Narrowing of the lateral recesses. Mild left and moderate right neural foraminal narrowing.  L5-S1: Trace anterolisthesis with disc unroofing. Severe facet arthropathy. No spinal canal stenosis. Moderate right and moderate to severe left neural foraminal narrowing.  IMPRESSION: 1. L2-L3 moderate spinal canal stenosis with moderate to severe right and mild left neural foraminal narrowing. Effacement of the lateral recesses at this level likely compresses the descending L3 nerve roots. 2. L4-L5 mild spinal canal stenosis with moderate right and mild left neural foraminal narrowing. Narrowing of the lateral recesses at this level could affect the descending L5 nerve roots. 3. L5-S1 moderate to severe left and moderate right neural foraminal narrowing.   Electronically Signed By: Wiliam Ke M.D. On: 07/31/2022 02:20  Lumbar MR wo contrast: No valid procedures specified. Lumbar CT wo contrast: No results found for this or any previous visit.  Lumbar DG Bending views:  Results for orders placed during the hospital encounter of 12/21/22  DG Lumbar Spine Complete W/Bend  Narrative CLINICAL DATA:  Low back pain for several months, no known injury, initial encounter  EXAM: LUMBAR SPINE - COMPLETE WITH BENDING VIEWS  COMPARISON:  06/03/2022  FINDINGS: Five lumbar type vertebral bodies are well visualized. Vertebral body height is well  maintained. Multilevel osteophytic changes are seen. Facet hypertrophic changes are noted as well. Anterolisthesis of L4 on L5 is noted. Mild retrolisthesis of L3 on L4 and L2 on L3 is seen as well. These changes are stable from prior exams. Flexion and extension views show no significant instability.  IMPRESSION: Multilevel listhesis as described without instability.   Electronically Signed By: Alcide Clever M.D. On: 12/26/2022 22:44         Sacroiliac Joint Imaging: Sacroiliac Joint DG: No results found for this or any previous visit.   Hip Imaging: Hip-R MR wo contrast: No results found for this or any previous visit.  Hip-L MR wo contrast: No results found for this or any previous visit.  Hip-R CT wo contrast: No results found for this or any previous visit.  Hip-L CT wo contrast: No results found for this or any previous visit.  Hip-R DG 2-3 views: No results found for this or any previous visit.  Hip-L DG 2-3 views: No results found for this or any previous visit.  Hip-B DG Bilateral: No results found for this or any previous visit.   Knee Imaging: Knee-R MR wo contrast: No results found for this or any previous visit.  Knee-L MR wo contrast: No results found for this or any previous visit.  Knee-R CT wo contrast: No results found for this or any previous visit.  Knee-L CT wo contrast: No results found for this or any previous visit.  Knee-R DG 4 views: No results found for this or any previous visit.  Knee-L DG 4 views: No results found for this or any previous visit.   Ankle  Imaging: Ankle-R DG Complete: No results found for this or any previous visit.  Ankle-L DG Complete: No results found for this or any previous visit.   Foot Imaging: Foot-R DG Complete: No results found for this or any previous visit.  Foot-L DG Complete: No results found for this or any previous visit.   Elbow Imaging: Elbow-R DG Complete: No results found for this or any previous visit.  Elbow-L DG Complete: No results found for this or any previous visit.   Wrist Imaging: Wrist-R DG Complete: No results found for this or any previous visit.  Wrist-L DG Complete: No results found for this or any previous visit.   Hand Imaging: Hand-R DG Complete: No results found for this or any previous visit.  Hand-L DG Complete: No results found for this or any previous visit.   Complexity Note: Imaging results reviewed.                         Meds   Current Outpatient Medications:    benztropine (COGENTIN) 0.5 MG tablet, Take by mouth., Disp: , Rfl:    busPIRone (BUSPAR) 10 MG tablet, Take 10 mg by mouth 2 (two) times daily., Disp: , Rfl:    busPIRone (BUSPAR) 5 MG tablet, Take 5 mg by mouth at bedtime., Disp: , Rfl:    cholecalciferol (VITAMIN D3) 25 MCG (1000 UNIT) tablet, Take 1,000 Units by mouth daily., Disp: , Rfl:    cloNIDine (CATAPRES) 0.1 MG tablet, Take by mouth., Disp: , Rfl:    cloZAPine (CLOZARIL) 100 MG tablet, Take 200 mg by mouth 2 (two) times daily., Disp: , Rfl:    gabapentin (NEURONTIN) 300 MG capsule, 1 PO QHS x 5 days then 1 PO BID x 5 days then 1 PO TID if tolerated, Disp: , Rfl:  hydrochlorothiazide (HYDRODIURIL) 25 MG tablet, Take 25 mg by mouth daily., Disp: , Rfl:    ibuprofen (ADVIL) 600 MG tablet, Take 600 mg by mouth 2 (two) times daily as needed., Disp: , Rfl:    JANUVIA 100 MG tablet, Take 100 mg by mouth daily., Disp: , Rfl:    lisinopril (ZESTRIL) 30 MG tablet, Take 30 mg by mouth daily., Disp: , Rfl:    lisinopril (ZESTRIL) 40 MG tablet, Take  40 mg by mouth daily., Disp: , Rfl:    loperamide (IMODIUM A-D) 2 MG tablet, , Disp: , Rfl:    loperamide (IMODIUM) 2 MG capsule, Take 2 mg by mouth as needed for diarrhea or loose stools. Take 1 capsule by mouth as needed with each loose stool/diarrhea nte 8 doses in 24 hr, Disp: , Rfl:    metFORMIN (GLUCOPHAGE) 1000 MG tablet, Take 1,000 mg by mouth daily., Disp: , Rfl:    Multiple Vitamin (DAILY VITE PO), Take 1 tablet by mouth daily., Disp: , Rfl:    naproxen (NAPROSYN) 500 MG tablet, Take 500 mg by mouth every 12 (twelve) hours as needed., Disp: , Rfl:    omeprazole (PRILOSEC) 20 MG capsule, Take 20 mg by mouth daily., Disp: , Rfl:    ondansetron (ZOFRAN) 4 MG tablet, Take 4 mg by mouth every 8 (eight) hours as needed for nausea or vomiting., Disp: , Rfl:    ondansetron (ZOFRAN-ODT) 4 MG disintegrating tablet, , Disp: , Rfl:    potassium chloride SA (KLOR-CON M) 20 MEQ tablet, Take 20 mEq by mouth 2 (two) times daily., Disp: , Rfl:    propranolol (INDERAL) 40 MG tablet, Take 40 mg by mouth 2 (two) times daily., Disp: , Rfl:    propranolol ER (INDERAL LA) 80 MG 24 hr capsule, , Disp: , Rfl:    rosuvastatin (CRESTOR) 5 MG tablet, Take 5 mg by mouth daily., Disp: , Rfl:    sitaGLIPtin (JANUVIA) 50 MG tablet, Take 100 mg by mouth daily. , Disp: , Rfl:   ROS  Constitutional: Denies any fever or chills Gastrointestinal: No reported hemesis, hematochezia, vomiting, or acute GI distress Musculoskeletal: Denies any acute onset joint swelling, redness, loss of ROM, or weakness Neurological: No reported episodes of acute onset apraxia, aphasia, dysarthria, agnosia, amnesia, paralysis, loss of coordination, or loss of consciousness  Allergies  Ms. Marbury is allergic to other and risperidone and related.  PFSH  Drug: Ms. Sergeant  reports no history of drug use. Alcohol:  reports no history of alcohol use. Tobacco:  reports that she has never smoked. She has never used smokeless tobacco. Medical:   has a past medical history of HTN (hypertension), Hypercholesteremia, Prediabetes, and Schizophrenia (HCC). Surgical: Ms. Villena  has a past surgical history that includes Tonsillectomy and Colonoscopy (2014). Family: family history is not on file.  Constitutional Exam  General appearance: Well nourished, well developed, and well hydrated. In no apparent acute distress There were no vitals filed for this visit. BMI Assessment: Estimated body mass index is 38.11 kg/m as calculated from the following:   Height as of 12/21/22: 5\' 4"  (1.626 m).   Weight as of 12/21/22: 222 lb (100.7 kg).  BMI interpretation table: BMI level Category Range association with higher incidence of chronic pain  <18 kg/m2 Underweight   18.5-24.9 kg/m2 Ideal body weight   25-29.9 kg/m2 Overweight Increased incidence by 20%  30-34.9 kg/m2 Obese (Class I) Increased incidence by 68%  35-39.9 kg/m2 Severe obesity (Class II) Increased incidence by  136%  >40 kg/m2 Extreme obesity (Class III) Increased incidence by 254%   Patient's current BMI Ideal Body weight  There is no height or weight on file to calculate BMI. Patient weight not recorded   BMI Readings from Last 4 Encounters:  12/21/22 38.11 kg/m  07/20/22 38.76 kg/m  05/24/22 39.01 kg/m  12/02/21 39.03 kg/m   Wt Readings from Last 4 Encounters:  12/21/22 222 lb (100.7 kg)  07/20/22 225 lb 12.8 oz (102.4 kg)  05/24/22 227 lb 4.7 oz (103.1 kg)  12/02/21 227 lb 6.4 oz (103.1 kg)    Psych/Mental status: Alert, oriented x 3 (person, place, & time)       Eyes: PERLA Respiratory: No evidence of acute respiratory distress  Assessment & Plan  Primary Diagnosis & Pertinent Problem List: There were no encounter diagnoses.  Visit Diagnosis: No diagnosis found. Problems updated and reviewed during this visit: No problems updated.  Plan of Care  Pharmacotherapy (Medications Ordered): No orders of the defined types were placed in this  encounter.  Procedure Orders    No procedure(s) ordered today   Lab Orders  No laboratory test(s) ordered today   Imaging Orders  No imaging studies ordered today   Referral Orders  No referral(s) requested today    Pharmacological management:  Opioid Analgesics: I will not be prescribing any opioids at this time Membrane stabilizer: I will not be prescribing any at this time Muscle relaxant: I will not be prescribing any at this time NSAID: I will not be prescribing any at this time Other analgesic(s): I will not be prescribing any at this time      Interventional Therapies  Risk Factors  Considerations:     Planned  Pending:      Under consideration:   Pending   Completed:   None at this time   Therapeutic  Palliative (PRN) options:   None established   Completed by other providers:   None reported        Provider-requested follow-up: No follow-ups on file. Recent Visits Date Type Provider Dept  12/21/22 Office Visit Delano Metz, MD [facility]  Showing recent visits within past 90 days and meeting all other requirements Future Appointments Date Type Provider Dept  01/13/23 Appointment Delano Metz, MD [facility]  Showing future appointments within next 90 days and meeting all other requirements   Primary Care Physician: System, Provider Not In  Duration of encounter: *** minutes.  Total time on encounter, as per AMA guidelines included both the face-to-face and non-face-to-face time personally spent by the physician and/or other qualified health care professional(s) on the day of the encounter (includes time in activities that require the physician or other qualified health care professional and does not include time in activities normally performed by clinical staff). Physician's time may include the following activities when performed: Preparing to see the patient (e.g., pre-charting review of records, searching for  previously ordered imaging, lab work, and nerve conduction tests) Review of prior analgesic pharmacotherapies. Reviewing PMP Interpreting ordered tests (e.g., lab work, imaging, nerve conduction tests) Performing post-procedure evaluations, including interpretation of diagnostic procedures Obtaining and/or reviewing separately obtained history Performing a medically appropriate examination and/or evaluation Counseling and educating the patient/family/caregiver Ordering medications, tests, or procedures Referring and communicating with other health care professionals (when not separately reported) Documenting clinical information in the electronic or other health record Independently interpreting results (not separately reported) and communicating results to the patient/ family/caregiver Care coordination (not separately reported)  Note by: Oswaldo Done, MD (TTS technology used. I apologize for any typographical errors that were not detected and corrected.) Date: 01/13/2023; Time: 5:57 AM

## 2023-01-13 ENCOUNTER — Ambulatory Visit (HOSPITAL_BASED_OUTPATIENT_CLINIC_OR_DEPARTMENT_OTHER): Payer: Medicare Other | Admitting: Pain Medicine

## 2023-01-13 DIAGNOSIS — Z91199 Patient's noncompliance with other medical treatment and regimen due to unspecified reason: Secondary | ICD-10-CM

## 2023-02-02 DIAGNOSIS — M47816 Spondylosis without myelopathy or radiculopathy, lumbar region: Secondary | ICD-10-CM | POA: Insufficient documentation

## 2023-02-02 DIAGNOSIS — M4800 Spinal stenosis, site unspecified: Secondary | ICD-10-CM | POA: Insufficient documentation

## 2023-02-02 DIAGNOSIS — M48061 Spinal stenosis, lumbar region without neurogenic claudication: Secondary | ICD-10-CM | POA: Insufficient documentation

## 2023-02-02 DIAGNOSIS — M5459 Other low back pain: Secondary | ICD-10-CM | POA: Insufficient documentation

## 2023-02-02 DIAGNOSIS — M5126 Other intervertebral disc displacement, lumbar region: Secondary | ICD-10-CM | POA: Insufficient documentation

## 2023-02-02 NOTE — Progress Notes (Addendum)
PROVIDER NOTE: Information contained herein reflects review and annotations entered in association with encounter. Interpretation of such information and data should be left to medically-trained personnel. Information provided to patient can be located elsewhere in the medical record under "Patient Instructions". Document created using STT-dictation technology, any transcriptional errors that may result from process are unintentional.    Patient: Valerie [name]  Service Category: E/M  Provider: Oswaldo Done, MD  DOB: 24-Oct-1960  DOS: 02/03/2023  Referring Provider: No ref. provider found  MRN: [id number]  Specialty: Interventional Pain Management  PCP: System, Provider Not In  Type: Established Patient  Setting: Ambulatory outpatient    Location: Office  Delivery: Face-to-face     Primary Reason(s) for Visit: Encounter for evaluation before starting new chronic pain management plan of care (Level of risk: moderate) CC: Migraine (bilat) and Foot Pain (right)  HPI  Valerie [name] is a 62 y.o. year old, female patient, who comes today for a follow-up evaluation to review the test results and decide on a treatment plan. She has Diarrhea; IBS (irritable bowel syndrome); Essential hypertension; Pure hypercholesterolemia; Schizophrenia (HCC); Chronic pain syndrome; Pharmacologic therapy; Disorder of skeletal system; Problems influencing health status; Chronic low back pain (1ry area of Pain) (Bilateral) (R>L) w/ sciatica (Right); Chronic lower extremity pain (2ry area of Pain) (Right); DDD (degenerative disc disease), lumbosacral; Abnormal MRI, lumbar spine (07/31/2022); Grade 1 Retrolisthesis of L2/L3 & L3/L4 (3mm); Grade 1 Anterolisthesis of lumbosacral spine (L5/S1) (3mm); Lumbar facet arthropathy (L2-3, L3-4, L4-5, L5-S1) (Bilateral); Lumbar facet joint pain; Lumbar facet joint syndrome; Herniated nucleus pulposus, L1-2  (Right) (extrusion); Central spinal stenosis (Lumbar) (L2-3, L4-5); Lumbar lateral  recess stenosis (L2-3, L4-5) (Bilateral); and 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 Her primarily concern today is the Migraine (bilat) and Foot Pain (right)  Pain Assessment: Location: Lower Back Radiating: right leg pain radiates to right foor Onset: More than a month ago Duration: Chronic pain Quality: Burning Severity: 4 /10 (subjective, self-reported pain score)  Effect on ADL: denies Timing: Constant Modifying factors: gabapentin BP: 127/81  HR: 91  Valerie [name] comes in today for a follow-up visit after her initial evaluation on 01/13/2023. Today we went over the results of her tests. These were explained in "Layman's terms". During today's appointment we went over my diagnostic impression, as well as the proposed treatment plan.  Review of initial evaluation (12/21/2022): "According to the patient the primary area of pain is that of the lower back (Bilateral) (R>L).  The patient denies any prior surgeries but she does admit to having had physical therapy for approximately 10 months.  She indicates that the physical therapy to have helped with her pain.  In addition the patient indicates also having had a fairly recent MRI of the lumbar spine.  She indicates that everything started approximately 11 months ago when she had "an injury" she is a poor historian and did not really clarify for me, despite the fact that I had asked several times, what that injury was.  She lives at [facility]", which seems to be in an assisted living facility.  She describes that she has lived there for over 14 years.  Again, she was unable or unwilling to tell me why she lived in that facility, only that she "needed help".   The patient indicates her secondary area of pain to be that of the right lower extremity.  She describes the low back pain to be worse than the lower extremity pain  and she describes this right lower extremity pain to go all the way down to the arch  of the foot and what appears to be an L4 dermatomal distribution.   Pharmacological treatment: The patient indicates taking gabapentin 300 mg, 1 tab p.o. 3 times daily along with either ibuprofen or naproxen PRN.  She refers that this helps with the pain.   Physical exam: The patient was able to heel walk, but she had difficulty with toe walking.  Every time that I asked her to do that, what she would do is bend the knees, but she really will not push herself onto the tips of her toes and would not lift her heels from the floor.  This was attempted several times unsuccessfully.  The patient also presented with decrease range of motion on hyperextension of the lumbar spine with some discomfort in the lower lumbar region.  She also presented with decreased range of motion on hyperextension on rotation maneuver which did seem to be positive for lumbar facet arthralgia.  This was concordant with the [facility] maneuver.   Prior interventional therapies: The patient comes in referred to [place] [facility] where she apparently had some nerve blocks done for her pain.  According to available notes the patient apparently had a right L2-3 and L4-5 epidural steroid injections which she apparently indicated were not very helpful.  The patient also appears to have had a right-sided L5-S1 TFESI.  In addition there are some notes from Ortho care as well."   Review of ordered test done 12/21/2022: Lab work was essentially within normal limits except for the comprehensive metabolic panel that demonstrated an elevated blood glucose level and elevated alkaline phosphatase.  Imaging of the lumbar spine with bending views demonstrated multilevel listhesis.  It also demonstrated facet hypertrophy with anterolisthesis of L4 over L5, retrolisthesis of L3 over L4 and L2 over L3.  All changes are stable from prior exams.  Dynamic views show no significant instability.   (07/31/2022) LUMBAR MRI FINDINGS: Alignment: 3  mm retrolisthesis L2 on L3 and L3 on L4. 3 mm anterolisthesis L5 on S1. Mild levocurvature of the lumbar spine.   DISC LEVELS: L1-2: Central/right paracentral disc extrusion with 6 mm of caudal migration. This indents the thecal sac but does not cause significant spinal canal stenosis. L2-3: Trace retrolisthesis and mild disc bulge. Moderate facet arthropathy. Moderate spinal canal stenosis. Effacement of the lateral recesses. Mild left and moderate to severe right neural foraminal narrowing. L3-4: Trace retrolisthesis and minimal disc bulge. Mild-to-moderate facet arthropathy.  L4-5: Mild disc bulge. Severe facet arthropathy. Mild spinal canal stenosis. Narrowing of the lateral recesses. Mild left and moderate right neural foraminal narrowing. L5-S1: Trace anterolisthesis with disc unroofing. Severe facet arthropathy. No spinal canal stenosis. Moderate right and moderate to severe left neural foraminal narrowing.   IMPRESSION: 1. L2-3 moderate spinal canal stenosis with moderate to severe right and mild left neural foraminal narrowing. Effacement of the lateral recesses at this level likely compresses the descending L3 nerve roots. 2. L4-5 mild spinal canal stenosis with moderate right and mild left neural foraminal narrowing. Narrowing of the lateral recesses at this level could affect the descending L5 nerve roots. 3. L5-S1 moderate to severe left and moderate right neural foraminal narrowing.  Today I went over all of the above information with the patient I have provided her with written copies of all of the above results including explaining all of them to her in layman's terms.  Today she has updated her symptoms indicated that currently she is experiencing absolutely no low back pain and therefore her primary area of pain has turned to that of the lower extremities (Bilateral) (R>L).  She describes that today the pain in the right lower extremity runs through the posterior and lateral aspect of  the leg going all the way down into the arch of the foot and what appears to be an L4 dermatomal distribution.  In the case of the left lower extremity the pain seems to run through the posterior aspect of the leg all the way down to the ankle but it does not go into her foot.  This particular 1 seems to be more of a referred type of pain pattern.  Based on the above results and information I have decided to schedule this patient for a diagnostic/therapeutic right L4-5 TFESI #1.  The plan was shared with the patient who understood and accepted.  Controlled Substance Pharmacotherapy Assessment REMS (Risk Evaluation and Mitigation Strategy)  Opioid Analgesic: None MME/day: 0 mg/day  Pill Count: None expected due to no prior prescriptions written by our practice. Nonah Mattes, RN  02/03/2023  6:59 PM  Signed Safety precautions to be maintained throughout the outpatient stay will include: orient to surroundings, keep bed in low position, maintain call bell within reach at all times, provide assistance with transfer out of bed and ambulation.    Pharmacokinetics: Liberation and absorption (onset of action): WNL Distribution (time to peak effect): WNL Metabolism and excretion (duration of action): WNL         Pharmacodynamics: Desired effects: Analgesia: Ms. Esperanza reports >50% benefit. Functional ability: Patient reports that medication allows her to accomplish basic ADLs Clinically meaningful improvement in function (CMIF): Sustained CMIF goals met Perceived effectiveness: Described as relatively effective, allowing for increase in activities of daily living (ADL) Undesirable effects: Side-effects or Adverse reactions: None reported Monitoring: [place] PMP: PDMP reviewed during this encounter. Online review of the past 56-month period previously conducted. Not applicable at this point since we have not taken over the patient's medication management yet. List of other Serum/Urine Drug Screening  Test(s):  No results found for: "AMPHSCRSER", "BARBSCRSER", "BENZOSCRSER", "COCAINSCRSER", "COCAINSCRNUR", "PCPSCRSER", "THCSCRSER", "THCU", "CANNABQUANT", "OPIATESCRSER", "OXYSCRSER", "PROPOXSCRSER", "ETH", "CBDTHCR", "D8THCCBX", "D9THCCBX" List of all UDS test(s) done:  Lab Results  Component Value Date   SUMMARY Note 12/21/2022   Last UDS on record: Summary  Date Value Ref Range Status  12/21/2022 Note  Final    Comment:    ==================================================================== Compliance Drug Analysis, Ur ==================================================================== Test                             Result       Flag       Units  Drug Present and Declared for Prescription Verification   Gabapentin                     PRESENT      EXPECTED   Clozapine                      PRESENT      EXPECTED   Desmethylclozapine             PRESENT      EXPECTED    Desmethylclozapine is an expected metabolite of clozapine.    Naproxen  PRESENT      EXPECTED   Propranolol                    PRESENT      EXPECTED  Drug Present not Declared for Prescription Verification   Acetaminophen                  PRESENT      UNEXPECTED  Drug Absent but Declared for Prescription Verification   Ibuprofen                      Not Detected UNEXPECTED    Ibuprofen, as indicated in the declared medication list, is not    always detected even when used as directed.    Benztropine                    Not Detected UNEXPECTED   Clonidine                      Not Detected UNEXPECTED ==================================================================== Test                      Result    Flag   Units      Ref Range   Creatinine              40               mg/dL      >=44 ==================================================================== Declared Medications:  The flagging and interpretation on this report are based on the  following declared medications.  Unexpected  results may arise from  inaccuracies in the declared medications.   **Note: The testing scope of this panel includes these medications:   Benztropine (Cogentin)  Clonidine (Catapres)  Clozapine (Clozaril)  Gabapentin (Neurontin)  Naproxen (Naprosyn)  Propranolol (Inderal)   **Note: The testing scope of this panel does not include small to  moderate amounts of these reported medications:   Ibuprofen (Advil)   **Note: The testing scope of this panel does not include the  following reported medications:   Buspirone (Buspar)  Hydrochlorothiazide  Lisinopril (Zestril)  Loperamide (Imodium)  Metformin (Glucophage)  Multivitamin  Omeprazole (Prilosec)  Ondansetron (Zofran)  Potassium (Klor-Con)  Rosuvastatin (Crestor)  Sitagliptin (Januvia)  Vitamin D3 ==================================================================== For clinical consultation, please call [phone]. ====================================================================    UDS interpretation: No unexpected findings.          Medication Assessment Form: Not applicable. No opioids. Treatment compliance: Not applicable Risk Assessment Profile: Aberrant behavior: See initial evaluations. None observed or detected today Comorbid factors increasing risk of overdose: See initial evaluation. No additional risks detected today Opioid risk tool (ORT):     12/21/2022    9:00 AM  Opioid Risk   Alcohol 0  Illegal Drugs 0  Rx Drugs 0  Alcohol 0  Illegal Drugs 0  Rx Drugs 0  Age between 16-45 years  0  History of Preadolescent Sexual Abuse 0  Psychological Disease 0  Depression 0  Opioid Risk Tool Scoring 0  Opioid Risk Interpretation Low Risk    ORT Scoring interpretation table:  Score <3 = Low Risk for SUD  Score between 4-7 = Moderate Risk for SUD  Score >8 = High Risk for Opioid Abuse   Risk of substance use disorder (SUD): Low  Risk Mitigation Strategies:  Patient opioid safety counseling: No  controlled substances prescribed. Patient-Prescriber Agreement (PPA): No agreement signed.  Controlled substance  notification to other providers: None required. No opioid therapy.  Pharmacologic Plan: Non-opioid analgesic therapy offered. Interventional alternatives discussed.             Laboratory Chemistry Profile   Renal Lab Results  Component Value Date   BUN 10 12/21/2022   CREATININE 0.76 12/21/2022   BCR 13 12/21/2022   GFRNONAA >60 05/24/2022   PROTEINUR NEGATIVE 10/27/2022     Electrolytes Lab Results  Component Value Date   NA 140 12/21/2022   K 4.5 12/21/2022   CL 100 12/21/2022   CALCIUM 9.6 12/21/2022   MG 2.1 12/21/2022     Hepatic Lab Results  Component Value Date   AST 18 12/21/2022   ALT 16 05/24/2022   ALBUMIN 4.0 12/21/2022   ALKPHOS 134 (H) 12/21/2022   LIPASE 32 05/24/2022     ID No results found for: "LYMEIGGIGMAB", "HIV", "SARSCOV2NAA", "STAPHAUREUS", "MRSAPCR", "HCVAB", "PREGTESTUR", "RMSFIGG", "QFVRPH1IGG", "QFVRPH2IGG"   Bone Lab Results  Component Value Date   25OHVITD1 44 12/21/2022   25OHVITD2 <1.0 12/21/2022   25OHVITD3 44 12/21/2022     Endocrine Lab Results  Component Value Date   GLUCOSE 195 (H) 12/21/2022   GLUCOSEU NEGATIVE 10/27/2022     Neuropathy Lab Results  Component Value Date   VITAMINB12 764 12/21/2022     CNS No results found for: "COLORCSF", "APPEARCSF", "RBCCOUNTCSF", "WBCCSF", "POLYSCSF", "LYMPHSCSF", "EOSCSF", "PROTEINCSF", "GLUCCSF", "JCVIRUS", "CSFOLI", "IGGCSF", "LABACHR", "ACETBL"   Inflammation (CRP: Acute  ESR: Chronic) Lab Results  Component Value Date   CRP 6 12/21/2022   ESRSEDRATE 39 12/21/2022     Rheumatology No results found for: "RF", "ANA", "LABURIC", "URICUR", "LYMEIGGIGMAB", "LYMEABIGMQN", "HLAB27"   Coagulation Lab Results  Component Value Date   PLT 272 05/24/2022     Cardiovascular Lab Results  Component Value Date   HGB 13.0 05/24/2022   HCT 39.6 05/24/2022      Screening No results found for: "SARSCOV2NAA", "COVIDSOURCE", "STAPHAUREUS", "MRSAPCR", "HCVAB", "HIV", "PREGTESTUR"   Cancer No results found for: "CEA", "CA125", "LABCA2"   Allergens No results found for: "ALMOND", "APPLE", "ASPARAGUS", "AVOCADO", "BANANA", "BARLEY", "BASIL", "BAYLEAF", "GREENBEAN", "LIMABEAN", "WHITEBEAN", "BEEFIGE", "REDBEET", "BLUEBERRY", "BROCCOLI", "CABBAGE", "MELON", "CARROT", "CASEIN", "CASHEWNUT", "CAULIFLOWER", "CELERY"     Note: Lab results reviewed.  Recent Diagnostic Imaging Review  Lumbosacral Imaging: Lumbar MR wo contrast: Results for orders placed during the hospital encounter of 07/30/22 MR LUMBAR SPINE WO CONTRAST  Narrative CLINICAL DATA:  Chronic bilateral low back pain with right-sided sciatica  EXAM: MRI LUMBAR SPINE WITHOUT CONTRAST  TECHNIQUE: Multiplanar, multisequence MR imaging of the lumbar spine was performed. No intravenous contrast was administered.  COMPARISON:  None Available.  FINDINGS: Segmentation:  5 lumbar type vertebral bodies.  Alignment: 3 mm retrolisthesis L2 on L3 and L3 on L4. 3 mm anterolisthesis L5 on S1. Mild levocurvature of the lumbar spine.  Vertebrae:  No fracture, evidence of discitis, or bone lesion.  Conus medullaris and cauda equina: Conus extends to the L1-L2 level. Conus and cauda equina appear normal.  Paraspinal and other soft tissues: Negative.  Disc levels:  T12-L1: Seen only on the sagittal images. No significant disc bulge, spinal canal stenosis, or neural foraminal narrowing.  L1-L2: Central/right paracentral disc extrusion with 6 mm of caudal migration. This indents the thecal sac but does not cause significant spinal canal stenosis. No neural foraminal narrowing.  L2-L3: Trace retrolisthesis and mild disc bulge. Moderate facet arthropathy. Moderate spinal canal stenosis. Effacement of the lateral recesses. Mild left and moderate to severe right  neural foraminal  narrowing.  L3-L4: Trace retrolisthesis and minimal disc bulge. Mild-to-moderate facet arthropathy. No spinal canal stenosis or neural foraminal narrowing.  L4-L5: Mild disc bulge. Severe facet arthropathy. Mild spinal canal stenosis. Narrowing of the lateral recesses. Mild left and moderate right neural foraminal narrowing.  L5-S1: Trace anterolisthesis with disc unroofing. Severe facet arthropathy. No spinal canal stenosis. Moderate right and moderate to severe left neural foraminal narrowing.  IMPRESSION: 1. L2-L3 moderate spinal canal stenosis with moderate to severe right and mild left neural foraminal narrowing. Effacement of the lateral recesses at this level likely compresses the descending L3 nerve roots. 2. L4-L5 mild spinal canal stenosis with moderate right and mild left neural foraminal narrowing. Narrowing of the lateral recesses at this level could affect the descending L5 nerve roots. 3. L5-S1 moderate to severe left and moderate right neural foraminal narrowing.   Electronically Signed By: Wiliam Ke M.D. On: 07/31/2022 02:20  Lumbar DG Bending views: Results for orders placed during the hospital encounter of 12/21/22 DG Lumbar Spine Complete W/Bend  Narrative CLINICAL DATA:  Low back pain for several months, no known injury, initial encounter  EXAM: LUMBAR SPINE - COMPLETE WITH BENDING VIEWS  COMPARISON:  06/03/2022  FINDINGS: Five lumbar type vertebral bodies are well visualized. Vertebral body height is well maintained. Multilevel osteophytic changes are seen. Facet hypertrophic changes are noted as well. Anterolisthesis of L4 on L5 is noted. Mild retrolisthesis of L3 on L4 and L2 on L3 is seen as well. These changes are stable from prior exams. Flexion and extension views show no significant instability.  IMPRESSION: Multilevel listhesis as described without instability.  Electronically Signed By: Alcide Clever M.D. On: 12/26/2022  22:44  Complexity Note: Imaging results reviewed.                         Meds   Current Outpatient Medications:    benztropine (COGENTIN) 0.5 MG tablet, Take by mouth., Disp: , Rfl:    busPIRone (BUSPAR) 10 MG tablet, Take 10 mg by mouth 2 (two) times daily., Disp: , Rfl:    busPIRone (BUSPAR) 5 MG tablet, Take 5 mg by mouth at bedtime., Disp: , Rfl:    cholecalciferol (VITAMIN D3) 25 MCG (1000 UNIT) tablet, Take 1,000 Units by mouth daily., Disp: , Rfl:    cloNIDine (CATAPRES) 0.1 MG tablet, Take by mouth., Disp: , Rfl:    cloZAPine (CLOZARIL) 100 MG tablet, Take 200 mg by mouth 2 (two) times daily., Disp: , Rfl:    gabapentin (NEURONTIN) 300 MG capsule, 1 PO QHS x 5 days then 1 PO BID x 5 days then 1 PO TID if tolerated, Disp: , Rfl:    hydrochlorothiazide (HYDRODIURIL) 25 MG tablet, Take 25 mg by mouth daily., Disp: , Rfl:    ibuprofen (ADVIL) 600 MG tablet, Take 600 mg by mouth 2 (two) times daily as needed., Disp: , Rfl:    JANUVIA 100 MG tablet, Take 100 mg by mouth daily. (Patient not taking: Reported on 02/09/2023), Disp: , Rfl:    lisinopril (ZESTRIL) 30 MG tablet, Take 30 mg by mouth daily., Disp: , Rfl:    lisinopril (ZESTRIL) 40 MG tablet, Take 40 mg by mouth daily. (Patient not taking: Reported on 02/09/2023), Disp: , Rfl:    loperamide (IMODIUM A-D) 2 MG tablet, , Disp: , Rfl:    loperamide (IMODIUM) 2 MG capsule, Take 2 mg by mouth as needed for diarrhea or loose stools. Take  1 capsule by mouth as needed with each loose stool/diarrhea nte 8 doses in 24 hr (Patient not taking: Reported on 02/09/2023), Disp: , Rfl:    metFORMIN (GLUCOPHAGE) 1000 MG tablet, Take 1,000 mg by mouth daily., Disp: , Rfl:    Multiple Vitamin (DAILY VITE PO), Take 1 tablet by mouth daily., Disp: , Rfl:    naproxen (NAPROSYN) 500 MG tablet, Take 500 mg by mouth every 12 (twelve) hours as needed., Disp: , Rfl:    omeprazole (PRILOSEC) 20 MG capsule, Take 10 mg by mouth daily., Disp: , Rfl:    ondansetron  (ZOFRAN) 4 MG tablet, Take 4 mg by mouth every 8 (eight) hours as needed for nausea or vomiting., Disp: , Rfl:    ondansetron (ZOFRAN-ODT) 4 MG disintegrating tablet, , Disp: , Rfl:    potassium chloride SA (KLOR-CON M) 20 MEQ tablet, Take 20 mEq by mouth 2 (two) times daily., Disp: , Rfl:    propranolol (INDERAL) 40 MG tablet, Take 40 mg by mouth 2 (two) times daily., Disp: , Rfl:    propranolol ER (INDERAL LA) 80 MG 24 hr capsule, , Disp: , Rfl:    rosuvastatin (CRESTOR) 5 MG tablet, Take 5 mg by mouth daily., Disp: , Rfl:    sitaGLIPtin (JANUVIA) 50 MG tablet, Take 100 mg by mouth daily. , Disp: , Rfl:    acetaminophen (TYLENOL) 650 MG CR tablet, Take 650 mg by mouth every 8 (eight) hours as needed for pain., Disp: , Rfl:    fluticasone (FLONASE) 50 MCG/ACT nasal spray, Place 1 spray into both nostrils daily., Disp: , Rfl:    haloperidol (HALDOL) 1 MG tablet, Take 1 mg by mouth 2 (two) times daily., Disp: , Rfl:  No current facility-administered medications for this visit.  Facility-Administered Medications Ordered in Other Visits:    dexamethasone (DECADRON) injection 10 mg, 10 mg, Other, Once, Delano Metz, MD   iohexol (OMNIPAQUE) 180 MG/ML injection 10 mL, 10 mL, Intrathecal, Once, Delano Metz, MD   lidocaine (XYLOCAINE) 2 % (with pres) injection 400 mg, 20 mL, Infiltration, Once, Delano Metz, MD   pentafluoroprop-tetrafluoroeth (GEBAUERS) aerosol, , Topical, Once, Delano Metz, MD   ropivacaine (PF) 2 mg/mL (0.2%) (NAROPIN) injection 1 mL, 1 mL, Epidural, Once, Delano Metz, MD   sodium chloride flush (NS) 0.9 % injection 1 mL, 1 mL, Other, Once, Delano Metz, MD  ROS  Constitutional: Denies any fever or chills Gastrointestinal: No reported hemesis, hematochezia, vomiting, or acute GI distress Musculoskeletal: Denies any acute onset joint swelling, redness, loss of ROM, or weakness Neurological: No reported episodes of acute onset apraxia,  aphasia, dysarthria, agnosia, amnesia, paralysis, loss of coordination, or loss of consciousness  Allergies  Ms. Frei is allergic to other and risperidone and related.  PFSH  Drug: Ms. Birge  reports no history of drug use. Alcohol:  reports no history of alcohol use. Tobacco:  reports that she has never smoked. She has never used smokeless tobacco. Medical:  has a past medical history of HTN (hypertension), Hypercholesteremia, Prediabetes, and Schizophrenia (HCC). Surgical: Ms. Hatcher  has a past surgical history that includes Tonsillectomy and Colonoscopy (2014). Family: family history is not on file.  Constitutional Exam  General appearance: Well nourished, well developed, and well hydrated. In no apparent acute distress Vitals:   02/03/23 1310  BP: 127/81  Pulse: 91  Resp: 17  Temp: (!) 97.4 F (36.3 C)  SpO2: 100%  Weight: 220 lb (99.8 kg)  Height: 5\' 4"  (1.626 m)  BMI Assessment: Estimated body mass index is 37.76 kg/m as calculated from the following:   Height as of this encounter: 5\' 4"  (1.626 m).   Weight as of this encounter: 220 lb (99.8 kg).  BMI interpretation table: BMI level Category Range association with higher incidence of chronic pain  <18 kg/m2 Underweight   18.5-24.9 kg/m2 Ideal body weight   25-29.9 kg/m2 Overweight Increased incidence by 20%  30-34.9 kg/m2 Obese (Class I) Increased incidence by 68%  35-39.9 kg/m2 Severe obesity (Class II) Increased incidence by 136%  >40 kg/m2 Extreme obesity (Class III) Increased incidence by 254%   Patient's current BMI Ideal Body weight  Body mass index is 37.76 kg/m. Ideal body weight: 54.7 kg (120 lb 9.5 oz) Adjusted ideal body weight: 72.7 kg (160 lb 5.7 oz)   BMI Readings from Last 4 Encounters:  02/09/23 37.76 kg/m  02/03/23 37.76 kg/m  12/21/22 38.11 kg/m  07/20/22 38.76 kg/m   Wt Readings from Last 4 Encounters:  02/09/23 220 lb (99.8 kg)  02/03/23 220 lb (99.8 kg)  12/21/22 222 lb (100.7  kg)  07/20/22 225 lb 12.8 oz (102.4 kg)    Psych/Mental status: Alert, oriented x 3 (person, place, & time)       Eyes: PERLA Respiratory: No evidence of acute respiratory distress  Assessment & Plan  Primary Diagnosis & Pertinent Problem List: The primary encounter diagnosis was Chronic low back pain (1ry area of Pain) (Bilateral) (R>L) w/ sciatica (Right). 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  Visit Diagnosis: 1. Chronic low back pain (1ry area of Pain) (Bilateral) (R>L) w/ sciatica (Right)   2. Chronic lower extremity pain (2ry area of Pain) (Right)   3. DDD (degenerative disc disease), lumbosacral   4. Anterolisthesis of lumbosacral spine   5. Retrolisthesis   6. Lumbar facet arthropathy   7. Lumbar facet joint pain   8. Lumbar facet joint syndrome   9. Herniated nucleus pulposus, L1-2  (Right) (extrusion)   10. 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 Central spinal stenosis (Lumbar) (L2-3, L4-5)   12. 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 Abnormal MRI, lumbar spine (07/31/2022)    Problems updated and reviewed during this visit: No problems updated.   Plan of Care  Pharmacotherapy (Medications Ordered): No orders of the defined types were placed in this encounter.  Procedure Orders         Lumbar Transforaminal Epidural     Lab Orders  No laboratory test(s) ordered today   Imaging Orders  No imaging studies ordered today    Referral Orders  No referral(s) requested today    Pharmacological management:  Opioid Analgesics: I will not be prescribing any opioids at this time Membrane stabilizer: I will not be prescribing any at this time Muscle relaxant: I will not be prescribing any at this time NSAID: I will not be prescribing any at this time Other analgesic(s): I will not be prescribing any at this time      Interventional Therapies  Risk Factors  Considerations:     Planned  Pending:  Diagnostic/therapeutic right L4-5 TFESI #1    Under consideration:   Diagnostic/therapeutic right L4-5 TFESI #1  Diagnostic/therapeutic right L4-5 LESI #1    Completed:   None at this time   Therapeutic  Palliative (PRN) options:   None established   Completed by other providers:   None reported         Provider-requested follow-up: Return for [facility]): (R) L4-5 TFESI #1. Recent Visits Date Type Provider Dept  02/03/23 Office Visit Delano Metz, MD [facility]  12/21/22 Office Visit Delano Metz, MD [facility]  Showing recent visits within past 90 days and meeting all other requirements Today's Visits Date Type Provider Dept  02/09/23 Procedure visit Delano Metz, MD [facility]  Showing today's visits and meeting all other requirements Future Appointments No visits were found meeting these conditions. Showing future appointments within next 90 days and meeting all other requirements   Primary Care Physician: System, Provider Not In  Duration of encounter: 40 minutes.  Total time on encounter, as per AMA guidelines included both the face-to-face and non-face-to-face time personally spent by the physician and/or other qualified health care professional(s) on the day of the encounter (includes time in activities that require the physician or other qualified health care professional and does not include time in activities normally performed by  clinical staff). Physician's time may include the following activities when performed: Preparing to see the patient (e.g., pre-charting review of records, searching for previously ordered imaging, lab work, and nerve conduction tests) Review of prior analgesic pharmacotherapies. Reviewing PMP Interpreting ordered tests (e.g., lab work, imaging, nerve conduction tests) Performing post-procedure evaluations, including interpretation of diagnostic procedures Obtaining and/or reviewing separately obtained history Performing a medically appropriate examination and/or evaluation Counseling and educating the patient/family/caregiver Ordering medications, tests, or procedures Referring and communicating with other health care professionals (when not separately reported) Documenting clinical information in the electronic or other health record Independently interpreting results (not separately reported) and communicating results to the patient/ family/caregiver Care coordination (not separately reported)  Note by: Oswaldo Done, MD (TTS technology used. I apologize for any typographical errors that were not detected and corrected.) Date: 02/03/2023; Time: 11:28 AM

## 2023-02-03 ENCOUNTER — Ambulatory Visit: Payer: Medicare Other | Attending: Pain Medicine | Admitting: Pain Medicine

## 2023-02-03 ENCOUNTER — Encounter: Payer: Self-pay | Admitting: Pain Medicine

## 2023-02-03 VITALS — BP 127/81 | HR 91 | Temp 97.4°F | Resp 17 | Ht 64.0 in | Wt 220.0 lb

## 2023-02-03 DIAGNOSIS — R937 Abnormal findings on diagnostic imaging of other parts of musculoskeletal system: Secondary | ICD-10-CM

## 2023-02-03 DIAGNOSIS — M5126 Other intervertebral disc displacement, lumbar region: Secondary | ICD-10-CM

## 2023-02-03 DIAGNOSIS — M4310 Spondylolisthesis, site unspecified: Secondary | ICD-10-CM | POA: Diagnosis present

## 2023-02-03 DIAGNOSIS — M48061 Spinal stenosis, lumbar region without neurogenic claudication: Secondary | ICD-10-CM | POA: Diagnosis present

## 2023-02-03 DIAGNOSIS — M4317 Spondylolisthesis, lumbosacral region: Secondary | ICD-10-CM | POA: Diagnosis not present

## 2023-02-03 DIAGNOSIS — M5459 Other low back pain: Secondary | ICD-10-CM | POA: Diagnosis present

## 2023-02-03 DIAGNOSIS — M47816 Spondylosis without myelopathy or radiculopathy, lumbar region: Secondary | ICD-10-CM

## 2023-02-03 DIAGNOSIS — M4800 Spinal stenosis, site unspecified: Secondary | ICD-10-CM

## 2023-02-03 DIAGNOSIS — M5441 Lumbago with sciatica, right side: Secondary | ICD-10-CM | POA: Diagnosis not present

## 2023-02-03 DIAGNOSIS — G8929 Other chronic pain: Secondary | ICD-10-CM | POA: Diagnosis present

## 2023-02-03 DIAGNOSIS — M79604 Pain in right leg: Secondary | ICD-10-CM

## 2023-02-03 DIAGNOSIS — M5137 Other intervertebral disc degeneration, lumbosacral region: Secondary | ICD-10-CM

## 2023-02-03 DIAGNOSIS — M51379 Other intervertebral disc degeneration, lumbosacral region without mention of lumbar back pain or lower extremity pain: Secondary | ICD-10-CM

## 2023-02-03 NOTE — Patient Instructions (Addendum)
Selective Nerve Root Block Patient Information  Description: Specific nerve roots exit the spinal canal and these nerves can be compressed and inflamed by a bulging disc and bone spurs.  By injecting steroids on the nerve root, we can potentially decrease the inflammation surrounding these nerves, which often leads to decreased pain.  Also, by injecting local anesthesia on the nerve root, this can provide us helpful information to give to your referring doctor if it decreases your pain.  Selective nerve root blocks can be done along the spine from the neck to the low back depending on the location of your pain.   After numbing the skin with local anesthesia, a small needle is passed to the nerve root and the position of the needle is verified using x-ray pictures.  After the needle is in correct position, we then deposit the medication.  You may experience a pressure sensation while this is being done.  The entire block usually lasts less than 15 minutes.  Conditions that may be treated with selective nerve root blocks: Low back and leg pain Spinal stenosis Diagnostic block prior to potential surgery Neck and arm pain Post laminectomy syndrome  Preparation for the injection:  Do not eat any solid food or dairy products within 8 hours of your appointment. You may drink clear liquids up to 3 hours before an appointment.  Clear liquids include water, black coffee, juice or soda.  No milk or cream please. You may take your regular medications, including pain medications, with a sip of water before your appointment.  Diabetics should hold regular insulin (if taken separately) and take 1/2 normal NPH dose the morning of the procedure.  Carry some sugar containing items with you to your appointment. A driver must accompany you and be prepared to drive you home after your procedure. Bring all your current medications with you. An IV may be inserted and sedation may be given at the discretion of the  physician. A blood pressure cuff, EKG, and other monitors will often be applied during the procedure.  Some patients may need to have extra oxygen administered for a short period. You will be asked to provide medical information, including allergies, prior to the procedure.  We must know immediately if you are taking blood  Thinners (like Coumadin) or if you are allergic to IV iodine contrast (dye).  Possible side-effects: All are usually temporary Bleeding from needle site Light headedness Numbness and tingling Decreased blood pressure Weakness in arms/legs Pressure sensation in back/neck Pain at injection site (several days)  Possible complications: All are extremely rare Infection Nerve injury Spinal headache (a headache wore with upright position)  Call if you experience: Fever/chills associated with headache or increased back/neck pain Headache worsened by an upright position New onset weakness or numbness of an extremity below the injection site Hives or difficulty breathing (go to the emergency room) Inflammation or drainage at the injection site(s) Severe back/neck pain greater than usual New symptoms which are concerning to you  Please note:  Although the local anesthetic injected can often make your back or neck feel good for several hours after the injection the pain will likely return.  It takes 3-5 days for steroids to work on the nerve root. You may not notice any pain relief for at least one week.  If effective, we will often do a series of 3 injections spaced 3-6 weeks apart to maximally decrease your pain.    If you have any questions, please call [phone] [place]   [facility] ______________________________________________________________________  Procedure instructions  Do not eat or drink fluids (other than water) for 6 hours before your procedure  No water for 2 hours before your procedure  Take your blood pressure  medicine with a sip of water  Arrive 30 minutes before your appointment  Carefully read the "Preparing for your procedure" detailed instructions  If you have questions call us at [phone]  _____________________________________________________________________    ______________________________________________________________________  Preparing for your procedure  Appointments: If you think you may not be able to keep your appointment, call 24-48 hours in advance to cancel. We need time to make it available to others.  During your procedure appointment there will be: No Prescription Refills. No disability issues to discussed. No medication changes or discussions.  Instructions: Food intake: Avoid eating anything solid for at least 8 hours prior to your procedure. Clear liquid intake: You may take clear liquids such as water up to 2 hours prior to your procedure. (No carbonated drinks. No soda.) Transportation: Unless otherwise stated by your physician, bring a driver. Morning Medicines: Except for blood thinners, take all of your other morning medications with a sip of water. Make sure to take your heart and blood pressure medicines. If your blood pressure's lower number is above 100, the case will be rescheduled. Blood thinners: Make sure to stop your blood thinners as instructed.  If you take a blood thinner, but were not instructed to stop it, call our office [phone] and ask to talk to a nurse. Not stopping a blood thinner prior to certain procedures could lead to serious complications. Diabetics on insulin: Notify the staff so that you can be scheduled 1st case in the morning. If your diabetes requires high dose insulin, take only  of your normal insulin dose the morning of the procedure and notify the staff that you have done so. Preventing infections: Shower with an antibacterial soap the morning of your procedure.  Build-up your immune system: Take 1000 mg of Vitamin  C with every meal (3 times a day) the day prior to your procedure. Antibiotics: Inform the nursing staff if you are taking any antibiotics or if you have any conditions that may require antibiotics prior to procedures. (Example: recent joint implants)   Pregnancy: If you are pregnant make sure to notify the nursing staff. Not doing so may result in injury to the fetus, including death.  Sickness: If you have a cold, fever, or any active infections, call and cancel or reschedule your procedure. Receiving steroids while having an infection may result in complications. Arrival: You must be in the facility at least 30 minutes prior to your scheduled procedure. Tardiness: Your scheduled time is also the cutoff time. If you do not arrive at least 15 minutes prior to your procedure, you will be rescheduled.  Children: Do not bring any children with you. Make arrangements to keep them home. Dress appropriately: There is always a possibility that your clothing may get soiled. Avoid long dresses. Valuables: Do not bring any jewelry or valuables.  Reasons to call and reschedule or cancel your procedure: (Following these recommendations will minimize the risk of a serious complication.) Surgeries: Avoid having procedures within 2 weeks of any surgery. (Avoid for 2 weeks before or after any surgery). Flu Shots: Avoid having procedures within 2 weeks of a flu shots or . (Avoid for 2 weeks before or after immunizations). Barium: Avoid having a procedure within 7-10 days after having had a  radiological study involving the use of radiological contrast. (Myelograms, Barium swallow or enema study). Heart attacks: Avoid any elective procedures or surgeries for the initial 6 months after a "Myocardial Infarction" (Heart Attack). Blood thinners: It is imperative that you stop these medications before procedures. Let us know if you if you take any blood thinner.  Infection: Avoid procedures during or within two weeks of an  infection (including chest colds or gastrointestinal problems). Symptoms associated with infections include: Localized redness, fever, chills, night sweats or profuse sweating, burning sensation when voiding, cough, congestion, stuffiness, runny nose, sore throat, diarrhea, nausea, vomiting, cold or Flu symptoms, recent or current infections. It is specially important if the infection is over the area that we intend to treat. Heart and lung problems: Symptoms that may suggest an active cardiopulmonary problem include: cough, chest pain, breathing difficulties or shortness of breath, dizziness, ankle swelling, uncontrolled high or unusually low blood pressure, and/or palpitations. If you are experiencing any of these symptoms, cancel your procedure and contact your primary care physician for an evaluation.  Remember:  Regular Business hours are:  Monday to Thursday 8:00 AM to 4:00 PM  Provider's Schedule: Delano Metz, MD:  Procedure days: Tuesday and Thursday 7:30 AM to 4:00 PM  Edward Jolly, MD:  Procedure days: Monday and Wednesday 7:30 AM to 4:00 PM  ______________________________________________________________________    ____________________________________________________________________________________________  General Risks and Possible Complications  Patient Responsibilities: It is important that you read this as it is part of your informed consent. It is our duty to inform you of the risks and possible complications associated with treatments offered to you. It is your responsibility as a patient to read this and to ask questions about anything that is not clear or that you believe was not covered in this document.  Patient's Rights: You have the right to refuse treatment. You also have the right to change your mind, even after initially having agreed to have the treatment done. However, under this last option, if you wait until the last second to change your mind, you may be  charged for the materials used up to that point.  Introduction: Medicine is not an Visual merchandiser. Everything in Medicine, including the lack of treatment(s), carries the potential for danger, harm, or loss (which is by definition: Risk). In Medicine, a complication is a secondary problem, condition, or disease that can aggravate an already existing one. All treatments carry the risk of possible complications. The fact that a side effects or complications occurs, does not imply that the treatment was conducted incorrectly. It must be clearly understood that these can happen even when everything is done following the highest safety standards.  No treatment: You can choose not to proceed with the proposed treatment alternative. The "PRO(s)" would include: avoiding the risk of complications associated with the therapy. The "CON(s)" would include: not getting any of the treatment benefits. These benefits fall under one of three categories: diagnostic; therapeutic; and/or palliative. Diagnostic benefits include: getting information which can ultimately lead to improvement of the disease or symptom(s). Therapeutic benefits are those associated with the successful treatment of the disease. Finally, palliative benefits are those related to the decrease of the primary symptoms, without necessarily curing the condition (example: decreasing the pain from a flare-up of a chronic condition, such as incurable terminal cancer).  General Risks and Complications: These are associated to most interventional treatments. They can occur alone, or in combination. They fall under one of the following six (6) categories: no  benefit or worsening of symptoms; bleeding; infection; nerve damage; allergic reactions; and/or death. No benefits or worsening of symptoms: In Medicine there are no guarantees, only probabilities. No healthcare provider can ever guarantee that a medical treatment will work, they can only state the probability  that it may. Furthermore, there is always the possibility that the condition may worsen, either directly, or indirectly, as a consequence of the treatment. Bleeding: This is more common if the patient is taking a blood thinner, either prescription or over the counter (example: Goody Powders, Fish oil, Aspirin, Garlic, etc.), or if suffering a condition associated with impaired coagulation (example: Hemophilia, cirrhosis of the liver, low platelet counts, etc.). However, even if you do not have one on these, it can still happen. If you have any of these conditions, or take one of these drugs, make sure to notify your treating physician. Infection: This is more common in patients with a compromised immune system, either due to disease (example: diabetes, cancer, human immunodeficiency virus [HIV], etc.), or due to medications or treatments (example: therapies used to treat cancer and rheumatological diseases). However, even if you do not have one on these, it can still happen. If you have any of these conditions, or take one of these drugs, make sure to notify your treating physician. Nerve Damage: This is more common when the treatment is an invasive one, but it can also happen with the use of medications, such as those used in the treatment of cancer. The damage can occur to small secondary nerves, or to large primary ones, such as those in the spinal cord and brain. This damage may be temporary or permanent and it may lead to impairments that can range from temporary numbness to permanent paralysis and/or brain death. Allergic Reactions: Any time a substance or material comes in contact with our body, there is the possibility of an allergic reaction. These can range from a mild skin rash (contact dermatitis) to a severe systemic reaction (anaphylactic reaction), which can result in death. Death: In general, any medical intervention can result in death, most of the time due to an unforeseen  complication. ____________________________________________________________________________________________

## 2023-02-03 NOTE — Progress Notes (Signed)
Safety precautions to be maintained throughout the outpatient stay will include: orient to surroundings, keep bed in low position, maintain call bell within reach at all times, provide assistance with transfer out of bed and ambulation.  

## 2023-02-08 NOTE — Progress Notes (Unsigned)
PROVIDER NOTE: Interpretation of information contained herein should be left to medically-trained personnel. Specific patient instructions are provided elsewhere under "Patient Instructions" section of medical record. This document was created in part using STT-dictation technology, any transcriptional errors that may result from this process are unintentional.  Patient: Valerie [name] Type: Established DOB: 1960/07/16 MRN: [id number] PCP: System, Provider Not In  Service: Procedure DOS: 02/09/2023 Setting: Ambulatory Location: Ambulatory outpatient facility Delivery: Face-to-face Provider: Oswaldo Done, MD Specialty: Interventional Pain Management Specialty designation: 09 Location: Outpatient facility Ref. Prov.: Delano Metz, MD       Interventional Therapy   Procedure: Lumbar trans-foraminal epidural steroid injection (L-TFESI) #1  Laterality: Right (-RT)  Level: L4 nerve root(s) Imaging: Fluoroscopy-guided         Anesthesia: Local anesthesia (1-2% Lidocaine) Anxiolysis: None                 Sedation: No Sedation                       DOS: 02/09/2023  Performed by: Oswaldo Done, MD  Purpose: Diagnostic/Therapeutic Indications: Lumbar radicular pain severe enough to impact quality of life or function. 1. Chronic low back pain (1ry area of Pain) (Bilateral) (R>L) w/ sciatica (Right)   2. Chronic lower extremity pain (2ry area of Pain) (Right)   3. DDD (degenerative disc disease), lumbosacral   4. Grade 1 Anterolisthesis of lumbosacral spine (L5/S1) (3mm)   5. Grade 1 Retrolisthesis of L2/L3 & L3/L4 (3mm)   6. 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  From the lower tip of the scapula down to the tailbone and from flank to flank. Materials:  Tray: Block Needle(s):  Type: Spinal  Gauge (G): 22  Length: 5-in  Qty: 1      H&P (Pre-op Assessment):  Ms. Zarco is a 62 y.o. (year old), female patient, seen today for interventional treatment. She  has a past surgical history that includes Tonsillectomy and Colonoscopy (2014). Ms. Cody has a current medication list which includes the following prescription(s): acetaminophen, benztropine, clozapine, fluticasone, gabapentin, haloperidol, hydrochlorothiazide, ibuprofen, lisinopril, loperamide, metformin, multiple vitamin, naproxen, omeprazole, ondansetron, potassium chloride sa, propranolol, rosuvastatin, sitagliptin, buspirone, buspirone, cholecalciferol, clonidine, januvia, lisinopril, loperamide, ondansetron, and propranolol er. Her primarily concern today is the Back Pain (lower)  Initial Vital Signs:  Pulse/HCG Rate: 99  Temp: 98.1 F (36.7 C) Resp: 20 BP: (!) 188/78 SpO2: 100 %  BMI: Estimated body mass index is 37.76 kg/m as calculated from the following:   Height as of this encounter: 5\' 4"  (1.626 m).   Weight as of this encounter: 220 lb (99.8 kg).  Risk Assessment: Allergies: Reviewed. She is allergic to other and risperidone and related.  Allergy Precautions: None required Coagulopathies: Reviewed. None identified.  Blood-thinner therapy: None at this time Active Infection(s): Reviewed. None identified. Ms. Vosburgh is afebrile  Site Confirmation: Ms. Overbay was asked to confirm the procedure and laterality before marking the site Procedure checklist: Completed Consent: Before the procedure and under the influence of no sedative(s), amnesic(s), or anxiolytics, the patient was informed of the treatment options, risks and possible complications. To  fulfill our ethical and legal obligations, as recommended by the American Medical  Association's Code of Ethics, I have informed the patient of my clinical impression; the nature and purpose of the treatment or procedure; the risks, benefits, and possible complications of the intervention; the alternatives, including doing nothing; the risk(s) and benefit(s) of the alternative treatment(s) or procedure(s); and the risk(s) and benefit(s) of doing nothing. The patient was provided information about the general risks and possible complications associated with the procedure. These may include, but are not limited to: failure to achieve desired goals, infection, bleeding, organ or nerve damage, allergic reactions, paralysis, and death. In addition, the patient was informed of those risks and complications associated to Spine-related procedures, such as failure to decrease pain; infection (i.e.: Meningitis, epidural or intraspinal abscess); bleeding (i.e.: epidural hematoma, subarachnoid hemorrhage, or any other type of intraspinal or peri-dural bleeding); organ or nerve damage (i.e.: Any type of peripheral nerve, nerve root, or spinal cord injury) with subsequent damage to sensory, motor, and/or autonomic systems, resulting in permanent pain, numbness, and/or weakness of one or several areas of the body; allergic reactions; (i.e.: anaphylactic reaction); and/or death. Furthermore, the patient was informed of those risks and complications associated with the medications. These include, but are not limited to: allergic reactions (i.e.: anaphylactic or anaphylactoid reaction(s)); adrenal axis suppression; blood sugar elevation that in diabetics may result in ketoacidosis or comma; water retention that in patients with history of congestive heart failure may result in shortness of breath, pulmonary edema, and decompensation with resultant heart failure; weight gain; swelling or edema; medication-induced neural toxicity; particulate matter embolism and blood vessel occlusion with resultant organ, and/or  nervous system infarction; and/or aseptic necrosis of one or more joints. Finally, the patient was informed that Medicine is not an exact science; therefore, there is also the possibility of unforeseen or unpredictable risks and/or possible complications that may result in a catastrophic outcome. The patient indicated having understood very clearly. We have given the patient no guarantees and we have made no promises. Enough time was given to the patient to ask questions, all of which were answered to the patient's satisfaction. Ms. Purgason has indicated that she wanted to continue with the procedure. Attestation: I, the ordering provider, attest that I have discussed with the patient the benefits, risks, side-effects, alternatives, likelihood of achieving goals, and potential problems during recovery for the procedure that I have provided informed consent. Date  Time: 02/09/2023 11:15 AM   Pre-Procedure Preparation:  Monitoring: As per clinic protocol. Respiration, ETCO2, SpO2, BP, heart rate and rhythm monitor placed and checked for adequate function Safety Precautions: Patient was assessed for positional comfort and pressure points before starting the procedure. Time-out: I initiated and conducted the "Time-out" before starting the procedure, as per protocol. The patient was asked to participate by confirming the accuracy of the "Time Out" information. Verification of the correct person, site, and procedure were performed and confirmed by me, the nursing staff, and the patient. "Time-out" conducted as per Joint Commission's Universal Protocol (UP.01.01.01). Time: 1135 Start Time: 1135 hrs.  Description/Narrative of Procedure:          Target: The 6 o'clock position under the pedicle, on the affected side. Region: Posterolateral Lumbosacral Approach: Posterior Percutaneous Paravertebral approach.  Rationale (medical necessity): procedure needed and proper for the diagnosis and/or treatment of the  patient's medical symptoms and needs. Procedural Technique Safety Precautions: Aspiration looking for blood return was conducted prior to all injections. At no point did we  inject any substances, as a needle was being advanced. No attempts were made at seeking any paresthesias. Safe injection practices and needle disposal techniques used. Medications properly checked for expiration dates. SDV (single dose vial) medications used. Description of the Procedure: Protocol guidelines were followed. The patient was placed in position over the procedure table. The target area was identified and the area prepped in the usual manner. Skin & deeper tissues infiltrated with local anesthetic. Appropriate amount of time allowed to pass for local anesthetics to take effect. The procedure needles were then advanced to the target area. Proper needle placement secured. Negative aspiration confirmed. Solution injected in intermittent fashion, asking for systemic symptoms every 0.5cc of injectate. The needles were then removed and the area cleansed, making sure to leave some of the prepping solution back to take advantage of its long term bactericidal properties.  Vitals:   02/09/23 1114 02/09/23 1133 02/09/23 1142 02/09/23 1149  BP: (!) 188/78 (!) 154/90 (!) 149/87 (!) 151/87  Pulse: 99 (!) 103 100 (!) 103  Resp:  20 18 20   Temp: 98.1 F (36.7 C)     SpO2: 100% 100% 100% 96%  Weight: 220 lb (99.8 kg)     Height: 5\' 4"  (1.626 m)       Start Time: 1135 hrs. End Time: 1143 hrs.  Imaging Guidance (Spinal):          Type of Imaging Technique: Fluoroscopy Guidance (Spinal) Indication(s): Assistance in needle guidance and placement for procedures requiring needle placement in or near specific anatomical locations not easily accessible without such assistance. Exposure Time: Please see nurses notes. Contrast: Before injecting any contrast, we confirmed that the patient did not have an allergy to iodine, shellfish, or  radiological contrast. Once satisfactory needle placement was completed at the desired level, radiological contrast was injected. Contrast injected under live fluoroscopy. No contrast complications. See chart for type and volume of contrast used. Fluoroscopic Guidance: I was personally present during the use of fluoroscopy. "Tunnel Vision Technique" used to obtain the best possible view of the target area. Parallax error corrected before commencing the procedure. "Direction-depth-direction" technique used to introduce the needle under continuous pulsed fluoroscopy. Once target was reached, antero-posterior, oblique, and lateral fluoroscopic projection used confirm needle placement in all planes. Images permanently stored in EMR. Interpretation: I personally interpreted the imaging intraoperatively. Adequate needle placement confirmed in multiple planes. Appropriate spread of contrast into desired area was observed. No evidence of afferent or efferent intravascular uptake. No intrathecal or subarachnoid spread observed. Permanent images saved into the patient's record.  Post-operative Assessment:  Post-procedure Vital Signs:  Pulse/HCG Rate: (!) 103  Temp: 98.1 F (36.7 C) Resp: 20 BP: (!) 151/87 SpO2: 96 %  EBL: None  Complications: No immediate post-treatment complications observed by team, or reported by patient.  Note: The patient tolerated the entire procedure well. A repeat set of vitals were taken after the procedure and the patient was kept under observation following institutional policy, for this type of procedure. Post-procedural neurological assessment was performed, showing return to baseline, prior to discharge. The patient was provided with post-procedure discharge instructions, including a section on how to identify potential problems. Should any problems arise concerning this procedure, the patient was given instructions to immediately contact us, at any time, without hesitation. In  any case, we plan to contact the patient by telephone for a follow-up status report regarding this interventional procedure.  Comments:  No additional relevant information.  Plan of Care (POC)  Orders:  Orders Placed This Encounter  Procedures   Lumbar Transforaminal Epidural    Scheduling Instructions:     Laterality: Right-sided     Level(s): L4             Sedation: Patient's choice.     Timeframe: Today    Order Specific Question:   Where will this procedure be performed?    Answer:   [facility]   [facility] C-ARM 1-60 MIN NO REPORT    Intraoperative interpretation by procedural physician at [facility].    Standing Status:   Standing    Number of Occurrences:   1    Order Specific Question:   Reason for exam:    Answer:   Assistance in needle guidance and placement for procedures requiring needle placement in or near specific anatomical locations not easily accessible without such assistance.   Informed Consent Details: Physician/Practitioner Attestation; Transcribe to consent form and obtain patient signature    Provider Attestation: I, Adair Lauderback A. Laban Emperor, MD, (Pain Management Specialist), the physician/practitioner, attest that I have discussed with the patient the benefits, risks, side effects, alternatives, likelihood of achieving goals and potential problems during recovery for the procedure that I have provided informed consent.    Scheduling Instructions:     Note: Always confirm laterality of pain with Ms. Joanne Gavel, before procedure.     Transcribe to consent form and obtain patient signature.    Order Specific Question:   Physician/Practitioner attestation of informed consent for procedure/surgical case    Answer:   I, the physician/practitioner, attest that I have discussed with the patient the benefits, risks, side effects, alternatives, likelihood of achieving goals and potential problems during recovery for the procedure that I have provided  informed consent.    Order Specific Question:   Procedure    Answer:   Diagnostic lumbar transforaminal epidural steroid injection under fluoroscopic guidance. (See notes for level and laterality.)    Order Specific Question:   Physician/Practitioner performing the procedure    Answer:   Cady Hafen A. 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 If not available, you may substitute with a water-soluble, non-ionic, hypoallergenic, myelogram-compatible radiological contrast medium.   lidocaine (XYLOCAINE) 2 % (with pres) injection 400 mg   pentafluoroprop-tetrafluoroeth (GEBAUERS) aerosol   sodium chloride flush (NS) 0.9 % injection 1 mL   ropivacaine (PF) 2 mg/mL (0.2%) (NAROPIN) injection 1 mL   dexamethasone (DECADRON) injection 10 mg   Medications administered: We administered iohexol, lidocaine, pentafluoroprop-tetrafluoroeth, sodium chloride flush, ropivacaine (PF) 2 mg/mL (0.2%), and dexamethasone.  See the medical record for exact dosing, route, and time of administration.  Follow-up plan:   Return in about 2 weeks (around 02/23/2023) for Proc-day (T,Th), (Face2F), (PPE).       Interventional  Therapies  Risk Factors  Considerations:     Planned  Pending:   Diagnostic/therapeutic right L4-5 TFESI #1 (02/09/2023)     Under consideration:   Diagnostic/therapeutic right L4-5 TFESI #1  Diagnostic/therapeutic right L4-5 LESI #1    Completed:   None at this time   Therapeutic  Palliative (PRN) options:   None established   Completed by other providers:   None reported          Recent Visits Date Type Provider Dept  02/03/23 Office Visit Delano Metz, MD [facility]  12/21/22 Office Visit Delano Metz, MD [facility]  Showing recent visits within past 90 days and meeting all other requirements Today's Visits Date Type Provider Dept  02/09/23 Procedure visit Delano Metz, MD [facility]  Showing today's visits and meeting all other requirements Future Appointments Date Type Provider Dept  02/23/23 Appointment Delano Metz, MD [facility]  Showing future appointments within next 90 days and meeting all other requirements  Disposition: Discharge home  Discharge (Date  Time): 02/09/2023; 1150 hrs.   Primary Care Physician: System, Provider Not In Location: [facility] Note by: Oswaldo Done, MD (TTS technology used. I apologize for any typographical errors that were not detected and corrected.) Date: 02/09/2023; Time: 1:53 PM  Disclaimer:  Medicine is not an Visual merchandiser. The only guarantee in medicine is that nothing is guaranteed. It is important to note that the decision to proceed with this intervention was based on the information collected from the patient. The Data and conclusions were drawn from the patient's questionnaire, the interview, and the physical examination. Because the information was provided in large part by the patient, it cannot be guaranteed that it has not been purposely or unconsciously manipulated. Every effort has been made to obtain as much relevant data as possible for this evaluation. It is important to note that the conclusions that lead to this procedure are  derived in large part from the available data. Always take into account that the treatment will also be dependent on availability of resources and existing treatment guidelines, considered by other Pain Management Practitioners as being common knowledge and practice, at the time of the intervention. For Medico-Legal purposes, it is also important to point out that variation in procedural techniques and pharmacological choices are the acceptable norm. The indications, contraindications, technique, and results of the above procedure should only be interpreted and judged by a Board-Certified Interventional Pain Specialist with extensive familiarity and expertise in the same exact procedure and technique.

## 2023-02-08 NOTE — Patient Instructions (Signed)

## 2023-02-09 ENCOUNTER — Ambulatory Visit: Admission: RE | Admit: 2023-02-09 | Payer: Medicare Other | Source: Ambulatory Visit

## 2023-02-09 ENCOUNTER — Encounter: Payer: Self-pay | Admitting: Pain Medicine

## 2023-02-09 ENCOUNTER — Ambulatory Visit: Payer: Medicare Other | Attending: Pain Medicine | Admitting: Pain Medicine

## 2023-02-09 VITALS — BP 151/87 | HR 103 | Temp 98.1°F | Resp 20 | Ht 64.0 in | Wt 220.0 lb

## 2023-02-09 DIAGNOSIS — M4317 Spondylolisthesis, lumbosacral region: Secondary | ICD-10-CM

## 2023-02-09 DIAGNOSIS — M79604 Pain in right leg: Secondary | ICD-10-CM | POA: Insufficient documentation

## 2023-02-09 DIAGNOSIS — M4800 Spinal stenosis, site unspecified: Secondary | ICD-10-CM

## 2023-02-09 DIAGNOSIS — M5441 Lumbago with sciatica, right side: Secondary | ICD-10-CM | POA: Diagnosis present

## 2023-02-09 DIAGNOSIS — M5137 Other intervertebral disc degeneration, lumbosacral region: Secondary | ICD-10-CM | POA: Diagnosis present

## 2023-02-09 DIAGNOSIS — M4310 Spondylolisthesis, site unspecified: Secondary | ICD-10-CM

## 2023-02-09 DIAGNOSIS — M5126 Other intervertebral disc displacement, lumbar region: Secondary | ICD-10-CM | POA: Diagnosis present

## 2023-02-09 DIAGNOSIS — M48061 Spinal stenosis, lumbar region without neurogenic claudication: Secondary | ICD-10-CM

## 2023-02-09 DIAGNOSIS — G8929 Other chronic pain: Secondary | ICD-10-CM

## 2023-02-09 DIAGNOSIS — M51379 Other intervertebral disc degeneration, lumbosacral region without mention of lumbar back pain or lower extremity pain: Secondary | ICD-10-CM

## 2023-02-09 MED ORDER — IOHEXOL 180 MG/ML  SOLN
INTRAMUSCULAR | Status: AC
  Filled 2023-02-09: qty 20

## 2023-02-09 MED ORDER — SODIUM CHLORIDE 0.9% FLUSH
1.0000 mL | Freq: Once | INTRAVENOUS | Status: AC
  Administered 2023-02-09: 1 mL

## 2023-02-09 MED ORDER — IOHEXOL 180 MG/ML  SOLN
10.0000 mL | Freq: Once | INTRAMUSCULAR | Status: AC
  Administered 2023-02-09: 10 mL via INTRATHECAL

## 2023-02-09 MED ORDER — DEXAMETHASONE SODIUM PHOSPHATE 10 MG/ML IJ SOLN
INTRAMUSCULAR | Status: AC
  Filled 2023-02-09: qty 1

## 2023-02-09 MED ORDER — LIDOCAINE HCL 2 % IJ SOLN
INTRAMUSCULAR | Status: AC
  Filled 2023-02-09: qty 20

## 2023-02-09 MED ORDER — ROPIVACAINE HCL 2 MG/ML IJ SOLN
1.0000 mL | Freq: Once | INTRAMUSCULAR | Status: AC
  Administered 2023-02-09: 1 mL via EPIDURAL

## 2023-02-09 MED ORDER — PENTAFLUOROPROP-TETRAFLUOROETH EX AERO
INHALATION_SPRAY | Freq: Once | CUTANEOUS | Status: AC
  Administered 2023-02-09: 30 via TOPICAL
  Filled 2023-02-09: qty 30

## 2023-02-09 MED ORDER — LIDOCAINE HCL 2 % IJ SOLN
20.0000 mL | Freq: Once | INTRAMUSCULAR | Status: AC
  Administered 2023-02-09: 400 mg

## 2023-02-09 MED ORDER — SODIUM CHLORIDE (PF) 0.9 % IJ SOLN
INTRAMUSCULAR | Status: AC
  Filled 2023-02-09: qty 10

## 2023-02-09 MED ORDER — ROPIVACAINE HCL 2 MG/ML IJ SOLN
INTRAMUSCULAR | Status: AC
  Filled 2023-02-09: qty 20

## 2023-02-09 MED ORDER — DEXAMETHASONE SODIUM PHOSPHATE 10 MG/ML IJ SOLN
10.0000 mg | Freq: Once | INTRAMUSCULAR | Status: AC
  Administered 2023-02-09: 10 mg

## 2023-02-09 NOTE — Progress Notes (Signed)
Safety precautions to be maintained throughout the outpatient stay will include: orient to surroundings, keep bed in low position, maintain call bell within reach at all times, provide assistance with transfer out of bed and ambulation.  

## 2023-02-10 ENCOUNTER — Telehealth: Payer: Self-pay | Admitting: *Deleted

## 2023-02-10 NOTE — Telephone Encounter (Signed)
Attempted to call for post procedure follow-up. Did not leave a message, number given is a Administrator, sports.

## 2023-02-22 NOTE — Progress Notes (Unsigned)
PROVIDER NOTE: Information contained herein reflects review and annotations entered in association with encounter. Interpretation of such information and data should be left to medically-trained personnel. Information provided to patient can be located elsewhere in the medical record under "Patient Instructions". Document created using STT-dictation technology, any transcriptional errors that may result from process are unintentional.    Patient: Valerie [name]  Service Category: E/M  Provider: Oswaldo Done, MD  DOB: 05/25/1961  DOS: 02/23/2023  Referring Provider: No ref. provider found  MRN: [id number]  Specialty: Interventional Pain Management  PCP: System, Provider Not In  Type: Established Patient  Setting: Ambulatory outpatient    Location: Office  Delivery: Face-to-face     HPI  [name], a 62 y.o. year old female, is here today because of her No primary diagnosis found.. [name] primary complain today is No chief complaint on file.  Pertinent problems: [name] has Chronic pain syndrome; Chronic low back pain (1ry area of Pain) (Bilateral) (R>L) w/ sciatica (Right); Chronic lower extremity pain (2ry area of Pain) (Right); DDD (degenerative disc disease), lumbosacral; Abnormal MRI, lumbar spine (07/31/2022); Grade 1 Retrolisthesis of L2/L3 & L3/L4 (3mm); Grade 1 Anterolisthesis of lumbosacral spine (L5/S1) (3mm); Lumbar facet arthropathy (L2-3, L3-4, L4-5, L5-S1) (Bilateral); Lumbar facet joint pain; Lumbar facet joint syndrome; Herniated nucleus pulposus, L1-2  (Right) (extrusion); Central spinal stenosis (Lumbar) (L2-3, L4-5); Lumbar lateral recess stenosis (L2-3, L4-5) (Bilateral); and 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 Pain Assessment: Severity of   is reported as a  /10. Location:    / . Onset:  . Quality:  . Timing:  . Modifying factor(s):  Marland Kitchen Vitals:  vitals were not taken for this visit.  BMI: Estimated body  mass index is 37.76 kg/m as calculated from the following:   Height as of 02/09/23: 5\' 4"  (1.626 m).   Weight as of 02/09/23: 220 lb (99.8 kg). Last encounter: 02/03/2023. Last procedure: 02/09/2023.  Reason for encounter: post-procedure evaluation and assessment. ***  Post-procedure evaluation   Procedure: Lumbar trans-foraminal epidural steroid injection (L-TFESI) #1  Laterality: Right (-RT)  Level: L4 nerve root(s) Imaging: Fluoroscopy-guided         Anesthesia: Local anesthesia (1-2% Lidocaine) Anxiolysis: None                 Sedation: No Sedation                       DOS: 02/09/2023  Performed by: Oswaldo Done, MD  Purpose: Diagnostic/Therapeutic Indications: Lumbar radicular pain severe enough to impact quality of life or function. 1. Chronic low back pain (1ry area of Pain) (Bilateral) (R>L) w/ sciatica (Right)   2. Chronic lower extremity pain (2ry area of Pain) (Right)   3. DDD (degenerative disc disease), lumbosacral   4. Grade 1 Anterolisthesis of lumbosacral spine (L5/S1) (3mm)   5. Grade 1 Retrolisthesis of L2/L3 & L3/L4 (3mm)   6. 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 Lumbar lateral recess stenosis (L2-3, L4-5) (Bilateral)    NAS-11 Pain score:   Pre-procedure: 4 /10   Post-procedure: 6  (standing)/10      Effectiveness:  Initial hour after procedure:   ***. Subsequent 4-6 hours post-procedure:   ***. Analgesia past initial 6 hours:   ***. Ongoing improvement:  Analgesic:  *** Function:    ***    ROM:    ***     Pharmacotherapy Assessment  Analgesic: None MME/day: 0 mg/day   Monitoring: [place] PMP: PDMP reviewed during this encounter.       Pharmacotherapy: No side-effects or adverse reactions reported. Compliance: No problems identified. Effectiveness: Clinically acceptable.  No notes on file  No results found for: "CBDTHCR" No results found for: "D8THCCBX" No results found for: "D9THCCBX"  UDS:  Summary  Date Value  Ref Range Status  12/21/2022 Note  Final    Comment:    ==================================================================== Compliance Drug Analysis, Ur ==================================================================== Test                             Result       Flag       Units  Drug Present and Declared for Prescription Verification   Gabapentin                     PRESENT      EXPECTED   Clozapine                      PRESENT      EXPECTED   Desmethylclozapine             PRESENT      EXPECTED    Desmethylclozapine is an expected metabolite of clozapine.    Naproxen                       PRESENT      EXPECTED   Propranolol                    PRESENT      EXPECTED  Drug Present not Declared for Prescription Verification   Acetaminophen                  PRESENT      UNEXPECTED  Drug Absent but Declared for Prescription Verification   Ibuprofen                      Not Detected UNEXPECTED    Ibuprofen, as indicated in the declared medication list, is not    always detected even when used as directed.    Benztropine                    Not Detected UNEXPECTED   Clonidine                      Not Detected UNEXPECTED ==================================================================== Test                      Result    Flag   Units      Ref Range   Creatinine              40               mg/dL      >=86 ==================================================================== Declared Medications:  The flagging and interpretation on this report are based on the  following declared medications.  Unexpected results may arise from  inaccuracies in the declared medications.   **Note: The testing scope of this panel includes these medications:   Benztropine (Cogentin)  Clonidine (Catapres)  Clozapine (Clozaril)  Gabapentin (Neurontin)  Naproxen (Naprosyn)  Propranolol (Inderal)   **Note: The testing scope of this panel does not include small to  moderate amounts of these  reported medications:  Ibuprofen (Advil)   **Note: The testing scope of this panel does not include the  following reported medications:   Buspirone (Buspar)  Hydrochlorothiazide  Lisinopril (Zestril)  Loperamide (Imodium)  Metformin (Glucophage)  Multivitamin  Omeprazole (Prilosec)  Ondansetron (Zofran)  Potassium (Klor-Con)  Rosuvastatin (Crestor)  Sitagliptin (Januvia)  Vitamin D3 ==================================================================== For clinical consultation, please call [phone]. ====================================================================       ROS  Constitutional: Denies any fever or chills Gastrointestinal: No reported hemesis, hematochezia, vomiting, or acute GI distress Musculoskeletal: Denies any acute onset joint swelling, redness, loss of ROM, or weakness Neurological: No reported episodes of acute onset apraxia, aphasia, dysarthria, agnosia, amnesia, paralysis, loss of coordination, or loss of consciousness  Medication Review  Multiple Vitamin, acetaminophen, benztropine, busPIRone, cholecalciferol, cloNIDine, cloZAPine, fluticasone, gabapentin, haloperidol, hydrochlorothiazide, ibuprofen, lisinopril, loperamide, metFORMIN, naproxen, omeprazole, ondansetron, potassium chloride SA, propranolol, propranolol ER, rosuvastatin, and sitaGLIPtin  History Review  Allergy: [name] is allergic to other and risperidone and related. Drug: [name]  reports no history of drug use. Alcohol:  reports no history of alcohol use. Tobacco:  reports that she has never smoked. She has never used smokeless tobacco. Social: [name]  reports that she has never smoked. She has never used smokeless tobacco. She reports that she does not drink alcohol and does not use drugs. Medical:  has a past medical history of HTN (hypertension), Hypercholesteremia, Prediabetes, and Schizophrenia (HCC). Surgical: Ms. Mccart  has a past surgical history that  includes Tonsillectomy and Colonoscopy (2014). Family: family history is not on file.  Laboratory Chemistry Profile   Renal Lab Results  Component Value Date   BUN 10 12/21/2022   CREATININE 0.76 12/21/2022   BCR 13 12/21/2022   GFRNONAA >60 05/24/2022    Hepatic Lab Results  Component Value Date   AST 18 12/21/2022   ALT 16 05/24/2022   ALBUMIN 4.0 12/21/2022   ALKPHOS 134 (H) 12/21/2022   LIPASE 32 05/24/2022    Electrolytes Lab Results  Component Value Date   NA 140 12/21/2022   K 4.5 12/21/2022   CL 100 12/21/2022   CALCIUM 9.6 12/21/2022   MG 2.1 12/21/2022    Bone Lab Results  Component Value Date   25OHVITD1 44 12/21/2022   25OHVITD2 <1.0 12/21/2022   25OHVITD3 44 12/21/2022    Inflammation (CRP: Acute Phase) (ESR: Chronic Phase) Lab Results  Component Value Date   CRP 6 12/21/2022   ESRSEDRATE 39 12/21/2022         Note: Above Lab results reviewed.  Recent Imaging Review  DG PAIN CLINIC C-ARM 1-60 MIN NO REPORT Fluoro was used, but no Radiologist interpretation will be provided.  Please refer to "NOTES" tab for provider progress note. Note: Reviewed        Physical Exam  General appearance: Well nourished, well developed, and well hydrated. In no apparent acute distress Mental status: Alert, oriented x 3 (person, place, & time)       Respiratory: No evidence of acute respiratory distress Eyes: PERLA Vitals: There were no vitals taken for this visit. BMI: Estimated body mass index is 37.76 kg/m as calculated from the following:   Height as of 02/09/23: 5\' 4"  (1.626 m).   Weight as of 02/09/23: 220 lb (99.8 kg). Ideal: Ideal body weight: 54.7 kg (120 lb 9.5 oz) Adjusted ideal body weight: 72.7 kg (160 lb 5.7 oz)  Assessment   Diagnosis Status  No diagnosis found. Controlled Controlled Controlled   Updated Problems: No problems updated.  Plan of Care  Problem-specific:  No problem-specific Assessment & Plan notes found for this  encounter.  [name] has a current medication list which includes the following long-term medication(s): clonidine, gabapentin, haloperidol, hydrochlorothiazide, januvia, lisinopril, lisinopril, omeprazole, potassium chloride sa, propranolol, propranolol er, rosuvastatin, and sitagliptin.  Pharmacotherapy (Medications Ordered): No orders of the defined types were placed in this encounter.  Orders:  No orders of the defined types were placed in this encounter.  Follow-up plan:   No follow-ups on file.      Interventional Therapies  Risk Factors  Considerations:     Planned  Pending:   Diagnostic/therapeutic right L4-5 TFESI #1 (02/09/2023)    Under consideration:   Diagnostic/therapeutic right L4-5 TFESI #1  Diagnostic/therapeutic right L4-5 LESI #1    Completed:   None at this time   Therapeutic  Palliative (PRN) options:   None established   Completed by other providers:   None reported           Recent Visits Date Type Provider Dept  02/09/23 Procedure visit Delano Metz, MD [facility]  02/03/23 Office Visit Delano Metz, MD [facility]  12/21/22 Office Visit Delano Metz, MD [facility]  Showing recent visits within past 90 days and meeting all other requirements Future Appointments Date Type Provider Dept  02/23/23 Appointment Delano Metz, MD [facility]  Showing future appointments within next 90 days and meeting all other requirements  I discussed the assessment and treatment plan with the patient. The patient was provided an opportunity to ask questions and all were answered. The patient agreed with the plan and demonstrated an understanding of the instructions.  Patient advised to call back or seek an in-person evaluation if the symptoms or condition worsens.  Duration of encounter: *** minutes.  Total time on encounter, as per AMA guidelines included both the face-to-face  and non-face-to-face time personally spent by the physician and/or other qualified health care professional(s) on the day of the encounter (includes time in activities that require the physician or other qualified health care professional and does not include time in activities normally performed by clinical staff). Physician's time may include the following activities when performed: Preparing to see the patient (e.g., pre-charting review of records, searching for previously ordered imaging, lab work, and nerve conduction tests) Review of prior analgesic pharmacotherapies. Reviewing PMP Interpreting ordered tests (e.g., lab work, imaging, nerve conduction tests) Performing post-procedure evaluations, including interpretation of diagnostic procedures Obtaining and/or reviewing separately obtained history Performing a medically appropriate examination and/or evaluation Counseling and educating the patient/family/caregiver Ordering medications, tests, or procedures Referring and communicating with other health care professionals (when not separately reported) Documenting clinical information in the electronic or other health record Independently interpreting results (not separately reported) and communicating results to the patient/ family/caregiver Care coordination (not separately reported)  Note by: Oswaldo Done, MD Date: 02/23/2023; Time: 3:47 PM

## 2023-02-23 ENCOUNTER — Ambulatory Visit (HOSPITAL_BASED_OUTPATIENT_CLINIC_OR_DEPARTMENT_OTHER): Payer: Medicare Other | Admitting: Pain Medicine

## 2023-02-23 DIAGNOSIS — Z09 Encounter for follow-up examination after completed treatment for conditions other than malignant neoplasm: Secondary | ICD-10-CM

## 2023-02-23 DIAGNOSIS — Z91199 Patient's noncompliance with other medical treatment and regimen due to unspecified reason: Secondary | ICD-10-CM

## 2023-04-13 ENCOUNTER — Ambulatory Visit: Payer: Medicare Other | Admitting: Gastroenterology

## 2023-04-20 ENCOUNTER — Encounter: Payer: Self-pay | Admitting: Gastroenterology

## 2023-04-20 ENCOUNTER — Ambulatory Visit (INDEPENDENT_AMBULATORY_CARE_PROVIDER_SITE_OTHER): Payer: Medicare Other | Admitting: Gastroenterology

## 2023-04-20 VITALS — BP 133/84 | HR 90 | Temp 97.5°F | Ht 64.0 in | Wt 212.6 lb

## 2023-04-20 DIAGNOSIS — K580 Irritable bowel syndrome with diarrhea: Secondary | ICD-10-CM | POA: Diagnosis not present

## 2023-04-20 DIAGNOSIS — Z1211 Encounter for screening for malignant neoplasm of colon: Secondary | ICD-10-CM | POA: Insufficient documentation

## 2023-04-20 DIAGNOSIS — K589 Irritable bowel syndrome without diarrhea: Secondary | ICD-10-CM

## 2023-04-20 NOTE — Patient Instructions (Signed)
Colonoscopy to be scheduled.  Please see separate instructions.

## 2023-04-20 NOTE — Progress Notes (Signed)
GI Office Note    Referring Provider: No ref. provider found Primary Care Physician:  System, Provider Not In  Primary Gastroenterologist: Roetta Sessions, MD   Chief Complaint   Chief Complaint  Patient presents with   Follow-up    History of Present Illness   Valerie [name] is a 62 y.o. female presenting today for follow up. Last seen 07/2022.  History of IBS-D.  She is due for colonoscopy this year.  Last colonoscopy in 2014 by Dr. Allena Katz, moderate pancolonic diverticulosis.  Doing well.  No significant issues with her IBS.  Bowel movements fairly consistent.  Contributes this to eating fruits and drinking vegetable juice every night.  Takes Imodium for occasional diarrhea.  Denies any blood in the stool or melena.  No upper GI symptoms.  Notes an intentional weight loss over the past 15 years, overall she reports losing 55 pounds.    Wt Readings from Last 5 Encounters:  04/20/23 212 lb 9.6 oz (96.4 kg)  02/09/23 220 lb (99.8 kg)  02/03/23 220 lb (99.8 kg)  12/21/22 222 lb (100.7 kg)  07/20/22 225 lb 12.8 oz (102.4 kg)    Medications   Current Outpatient Medications  Medication Sig Dispense Refill   acetaminophen (TYLENOL) 650 MG CR tablet Take 650 mg by mouth every 8 (eight) hours as needed for pain.     benztropine (COGENTIN) 0.5 MG tablet Take by mouth.     Cholecalciferol (VITAMIN D) 125 MCG (5000 UT) CAPS Take 1,000 Units by mouth once a week.     cloZAPine (CLOZARIL) 100 MG tablet Take 200 mg by mouth 2 (two) times daily.     DULoxetine (CYMBALTA) 20 MG capsule Take 20 mg by mouth daily.     fluticasone (FLONASE) 50 MCG/ACT nasal spray Place 1 spray into both nostrils daily.     gabapentin (NEURONTIN) 300 MG capsule Take 300 mg by mouth 3 (three) times daily.     haloperidol (HALDOL) 0.5 MG tablet Take 0.5 mg by mouth 2 (two) times daily.     hydrochlorothiazide (HYDRODIURIL) 25 MG tablet Take 25 mg by mouth daily.     lisinopril (ZESTRIL) 30 MG tablet Take  30 mg by mouth daily.     loperamide (IMODIUM) 2 MG capsule Take 2 mg by mouth as needed for diarrhea or loose stools. Take 1 capsule by mouth as needed with each loose stool/diarrhea nte 8 doses in 24 hr     Multiple Vitamin (DAILY VITE PO) Take 1 tablet by mouth daily.     naproxen (NAPROSYN) 500 MG tablet Take 500 mg by mouth every 12 (twelve) hours as needed.     omeprazole (PRILOSEC) 20 MG capsule Take 20 mg by mouth daily.     ondansetron (ZOFRAN) 4 MG tablet Take 4 mg by mouth every 8 (eight) hours as needed for nausea or vomiting.     potassium chloride SA (KLOR-CON M) 20 MEQ tablet Take 20 mEq by mouth 2 (two) times daily.     propranolol (INDERAL) 40 MG tablet Take 40 mg by mouth 2 (two) times daily.     rosuvastatin (CRESTOR) 5 MG tablet Take 5 mg by mouth daily.     triamcinolone ointment (KENALOG) 0.1 % Apply topically.     No current facility-administered medications for this visit.    Allergies   Allergies as of 04/20/2023 - Review Complete 04/20/2023  Allergen Reaction Noted   Other  05/06/2020   Risperidone and related  01/18/2019  Past Medical History   Past Medical History:  Diagnosis Date   HTN (hypertension)    Hypercholesteremia    Prediabetes    Schizophrenia (HCC)     Past Surgical History   Past Surgical History:  Procedure Laterality Date   COLONOSCOPY  2014   Dr. Allena Katz: Pancolonic diverticulosis, tortuous colon, next colonoscopy 2024   TONSILLECTOMY      Past Family History   Family History  Problem Relation Age of Onset   Colon cancer Neg Hx    Celiac disease Neg Hx    Inflammatory bowel disease Neg Hx     Past Social History   Social History   Socioeconomic History   Marital status: Single    Spouse name: Not on file   Number of children: Not on file   Years of education: Not on file   Highest education level: Not on file  Occupational History   Not on file  Tobacco Use   Smoking status: Never   Smokeless tobacco: Never   Substance and Sexual Activity   Alcohol use: Never   Drug use: Never   Sexual activity: Not Currently    Birth control/protection: Pill  Other Topics Concern   Not on file  Social History Narrative   Not on file   Social Determinants of Health   Financial Resource Strain: Not on file  Food Insecurity: Not on file  Transportation Needs: Not on file  Physical Activity: Not on file  Stress: Not on file  Social Connections: Not on file  Intimate Partner Violence: Not on file    Review of Systems   General: Negative for anorexia, weight loss, fever, chills, fatigue, weakness. ENT: Negative for hoarseness, difficulty swallowing , nasal congestion. CV: Negative for chest pain, angina, palpitations, dyspnea on exertion, peripheral edema.  Respiratory: Negative for dyspnea at rest, dyspnea on exertion, cough, sputum, wheezing.  GI: See history of present illness. GU:  Negative for dysuria, hematuria, urinary incontinence, urinary frequency, nocturnal urination.  Endo: Negative for unusual weight change.     Physical Exam   BP 133/84 (BP Location: Left Arm, Patient Position: Sitting, Cuff Size: Large)   Pulse 90   Temp (!) 97.5 F (36.4 C) (Oral)   Ht 5\' 4"  (1.626 m)   Wt 212 lb 9.6 oz (96.4 kg)   SpO2 98%   BMI 36.49 kg/m    General: Well-nourished, well-developed in no acute distress.  Eyes: No icterus. Mouth: Oropharyngeal mucosa moist and pink   Lungs: Clear to auscultation bilaterally.  Heart: Regular rate and rhythm, no murmurs rubs or gallops.  Abdomen: Bowel sounds are normal, nontender, nondistended, no hepatosplenomegaly or masses,  no abdominal bruits or hernia , no rebound or guarding.  Rectal: Not performed Extremities: No lower extremity edema. No clubbing or deformities. Neuro: Alert and oriented x 4   Skin: Warm and dry, no jaundice.   Psych: Alert and cooperative, normal mood and affect.  Labs   Lab Results  Component Value Date   NA 140 12/21/2022    CL 100 12/21/2022   K 4.5 12/21/2022   CO2 26 05/24/2022   BUN 10 12/21/2022   CREATININE 0.76 12/21/2022   EGFR 90 12/21/2022   CALCIUM 9.6 12/21/2022   ALBUMIN 4.0 12/21/2022   GLUCOSE 195 (H) 12/21/2022   Lab Results  Component Value Date   ALT 16 05/24/2022   AST 18 12/21/2022   ALKPHOS 134 (H) 12/21/2022   BILITOT <0.2 12/21/2022   Lab Results  Component Value Date   WBC 10.6 (H) 05/24/2022   HGB 13.0 05/24/2022   HCT 39.6 05/24/2022   MCV 86.7 05/24/2022   PLT 272 05/24/2022    Imaging Studies   No results found.  Assessment/Plan:   IBS, controlled with Imodium as needed. Colon cancer screening-due for 10-year screening colonoscopy  Colonoscopy with Dr. Jena Gauss.  ASA 2.  I have discussed the risks, alternatives, benefits with regards to but not limited to the risk of reaction to medication, bleeding, infection, perforation and the patient is agreeable to proceed. Written consent to be obtained.     Leanna Battles. Melvyn Neth, MHS, PA-C [facility]

## 2023-04-21 ENCOUNTER — Telehealth: Payer: Self-pay | Admitting: *Deleted

## 2023-04-21 DIAGNOSIS — Z1211 Encounter for screening for malignant neoplasm of colon: Secondary | ICD-10-CM

## 2023-04-21 NOTE — Telephone Encounter (Signed)
Called [facility] and had to [facility] to schedule TCS with Dr. Jena Gauss, ASA 2, NO IMODIUM X 7 days prior, take bisacodyl 10mg  every day x 3 days prior

## 2023-04-22 NOTE — Telephone Encounter (Signed)
Called [facility] and had to leave another message for a call back

## 2023-04-28 NOTE — Telephone Encounter (Signed)
Called [facility] and no answer and not able to leave VM "user mailbox can't accept more messages".

## 2023-04-29 MED ORDER — PEG 3350-KCL-NA BICARB-NACL 420 G PO SOLR: 4000.0000 mL | Freq: Once | ORAL | 0 refills | Status: AC

## 2023-04-29 MED ORDER — FLEET ENEMA RE ENEM: ENEMA | RECTAL | 0 refills | Status: AC

## 2023-04-29 MED ORDER — BISACODYL 5 MG PO TBEC: DELAYED_RELEASE_TABLET | ORAL | 0 refills | Status: AC

## 2023-04-29 NOTE — Addendum Note (Signed)
Addended by: Armstead Peaks on: 04/29/2023 10:31 AM   Modules accepted: Orders

## 2023-04-29 NOTE — Telephone Encounter (Signed)
Spoke with Edson Snowball at [facility]. She scheduled patient for 10/28 with Dr. Jena Gauss. Advised me to send prep rx to [facility]. Instructions to be faxed to her at [phone] Lab order also faxed for BMET to be done.

## 2023-05-10 ENCOUNTER — Ambulatory Visit (HOSPITAL_COMMUNITY): Admission: RE | Admit: 2023-05-10 | Payer: Medicare Other | Source: Home / Self Care | Admitting: Internal Medicine

## 2023-05-10 ENCOUNTER — Encounter (HOSPITAL_COMMUNITY): Admission: RE | Payer: Self-pay | Source: Home / Self Care

## 2023-05-10 ENCOUNTER — Other Ambulatory Visit: Payer: Self-pay | Admitting: Pain Medicine

## 2023-05-10 SURGERY — COLONOSCOPY WITH PROPOFOL
Anesthesia: Monitor Anesthesia Care

## 2023-05-10 NOTE — Progress Notes (Signed)
(  05/10/2023) the patient apparently dropped by the office and left a note saying: "I would like to thank you and your team for my medical care.  I am feeling much better.  No lumbar pain.  However I have been experiencing some pain in my feet.  I will contact your office again if need be, for further appointments."  Patient did not keep her 02/23/2023 postprocedure appointment.  The details from the postprocedure evaluation will not be available to determine effectiveness of treatment or to assess for possible repeat therapy.

## 2023-10-13 ENCOUNTER — Encounter: Payer: Self-pay | Admitting: Gastroenterology

## 2023-11-15 ENCOUNTER — Other Ambulatory Visit (HOSPITAL_COMMUNITY): Payer: Self-pay | Admitting: Family Medicine

## 2023-11-15 DIAGNOSIS — Z1231 Encounter for screening mammogram for malignant neoplasm of breast: Secondary | ICD-10-CM

## 2024-01-10 ENCOUNTER — Other Ambulatory Visit (HOSPITAL_COMMUNITY): Payer: Self-pay | Admitting: Family Medicine

## 2024-01-10 ENCOUNTER — Ambulatory Visit (HOSPITAL_COMMUNITY)
Admission: RE | Admit: 2024-01-10 | Discharge: 2024-01-10 | Disposition: A | Discharge status: Home / Self Care | Source: Ambulatory Visit | Attending: Family Medicine | Admitting: Family Medicine

## 2024-01-10 DIAGNOSIS — Z1231 Encounter for screening mammogram for malignant neoplasm of breast: Secondary | ICD-10-CM

## 2024-06-18 ENCOUNTER — Encounter (HOSPITAL_COMMUNITY): Payer: Self-pay

## 2024-06-18 ENCOUNTER — Other Ambulatory Visit: Payer: Self-pay

## 2024-06-18 ENCOUNTER — Emergency Department (HOSPITAL_COMMUNITY)
Admission: EM | Admit: 2024-06-18 | Discharge: 2024-06-19 | Disposition: A | Attending: Emergency Medicine | Admitting: Emergency Medicine

## 2024-06-18 DIAGNOSIS — F209 Schizophrenia, unspecified: Secondary | ICD-10-CM | POA: Diagnosis present

## 2024-06-18 DIAGNOSIS — I10 Essential (primary) hypertension: Secondary | ICD-10-CM | POA: Diagnosis not present

## 2024-06-18 DIAGNOSIS — Z79899 Other long term (current) drug therapy: Secondary | ICD-10-CM | POA: Diagnosis not present

## 2024-06-18 DIAGNOSIS — F329 Major depressive disorder, single episode, unspecified: Secondary | ICD-10-CM | POA: Diagnosis not present

## 2024-06-18 DIAGNOSIS — Y900 Blood alcohol level of less than 20 mg/100 ml: Secondary | ICD-10-CM | POA: Diagnosis not present

## 2024-06-18 LAB — COMPREHENSIVE METABOLIC PANEL WITH GFR
ALT: 10 U/L (ref 0–44)
AST: 17 U/L (ref 15–41)
Albumin: 3.9 g/dL (ref 3.5–5.0)
Alkaline Phosphatase: 117 U/L (ref 38–126)
Anion gap: 5 (ref 5–15)
BUN: 6 mg/dL — ABNORMAL LOW (ref 8–23)
CO2: 32 mmol/L (ref 22–32)
Calcium: 8.9 mg/dL (ref 8.9–10.3)
Chloride: 103 mmol/L (ref 98–111)
Creatinine, Ser: 0.86 mg/dL (ref 0.44–1.00)
GFR, Estimated: >60 mL/min (ref >60)
Glucose, Bld: 108 mg/dL — ABNORMAL HIGH (ref 70–99)
Potassium: 3.8 mmol/L (ref 3.5–5.1)
Sodium: 139 mmol/L (ref 135–145)
Total Bilirubin: 0.2 mg/dL (ref 0.0–1.2)
Total Protein: 6.6 g/dL (ref 6.5–8.1)

## 2024-06-18 LAB — CBC WITH DIFFERENTIAL/PLATELET
Abs Immature Granulocytes: 0.01 K/uL (ref 0.00–0.07)
Basophils Absolute: 0.1 K/uL (ref 0.0–0.1)
Basophils Relative: 1 %
Eosinophils Absolute: 0.3 K/uL (ref 0.0–0.5)
Eosinophils Relative: 3 %
HCT: 39.7 % (ref 36.0–46.0)
Hemoglobin: 12.7 g/dL (ref 12.0–15.0)
Immature Granulocytes: 0 %
Lymphocytes Relative: 38 %
Lymphs Abs: 3 K/uL (ref 0.7–4.0)
MCH: 28.3 pg (ref 26.0–34.0)
MCHC: 32 g/dL (ref 30.0–36.0)
MCV: 88.6 fL (ref 80.0–100.0)
Monocytes Absolute: 0.8 K/uL (ref 0.1–1.0)
Monocytes Relative: 10 %
Neutro Abs: 3.8 K/uL (ref 1.7–7.7)
Neutrophils Relative %: 48 %
Platelets: 354 K/uL (ref 150–400)
RBC: 4.48 MIL/uL (ref 3.87–5.11)
RDW: 13.7 % (ref 11.5–15.5)
WBC: 8 K/uL (ref 4.0–10.5)
nRBC: 0 % (ref 0.0–0.2)

## 2024-06-18 LAB — URINE DRUG SCREEN
Amphetamines: NEGATIVE
Barbiturates: NEGATIVE
Benzodiazepines: NEGATIVE
Cocaine: NEGATIVE
Fentanyl: NEGATIVE
Methadone Scn, Ur: NEGATIVE
Opiates: NEGATIVE
Tetrahydrocannabinol: NEGATIVE

## 2024-06-18 LAB — ETHANOL: Alcohol, Ethyl (B): <15 mg/dL (ref <15)

## 2024-06-18 MED ORDER — PANTOPRAZOLE SODIUM 40 MG PO TBEC
40.0000 mg | DELAYED_RELEASE_TABLET | Freq: Every day | ORAL | Status: DC
  Administered 2024-06-18: 40 mg via ORAL
  Filled 2024-06-18: qty 1

## 2024-06-18 MED ORDER — DULOXETINE HCL 20 MG PO CPEP
20.0000 mg | ORAL_CAPSULE | Freq: Every day | ORAL | Status: DC
  Administered 2024-06-18: 20 mg via ORAL
  Filled 2024-06-18: qty 1

## 2024-06-18 MED ORDER — HYDROCHLOROTHIAZIDE 25 MG PO TABS
25.0000 mg | ORAL_TABLET | Freq: Every day | ORAL | Status: DC
  Administered 2024-06-18: 25 mg via ORAL
  Filled 2024-06-18: qty 1

## 2024-06-18 MED ORDER — PROPRANOLOL HCL 10 MG PO TABS
40.0000 mg | ORAL_TABLET | Freq: Two times a day (BID) | ORAL | Status: DC
  Administered 2024-06-18: 40 mg via ORAL
  Filled 2024-06-18: qty 4

## 2024-06-18 MED ORDER — NAPROXEN 250 MG PO TABS
500.0000 mg | ORAL_TABLET | Freq: Once | ORAL | Status: AC
  Administered 2024-06-18: 500 mg via ORAL
  Filled 2024-06-18: qty 2

## 2024-06-18 MED ORDER — GABAPENTIN 300 MG PO CAPS
300.0000 mg | ORAL_CAPSULE | Freq: Three times a day (TID) | ORAL | Status: DC
  Administered 2024-06-18: 300 mg via ORAL
  Filled 2024-06-18: qty 1

## 2024-06-18 MED ORDER — ACETAMINOPHEN 500 MG PO TABS: 500.0000 mg | ORAL_TABLET | Freq: Three times a day (TID) | ORAL | Status: DC | PRN

## 2024-06-18 MED ORDER — METHOCARBAMOL 500 MG PO TABS
500.0000 mg | ORAL_TABLET | Freq: Once | ORAL | Status: AC
  Administered 2024-06-18: 500 mg via ORAL
  Filled 2024-06-18: qty 1

## 2024-06-18 MED ORDER — CLOZAPINE 100 MG PO TABS: 200.0000 mg | ORAL_TABLET | Freq: Two times a day (BID) | ORAL | Status: DC

## 2024-06-18 MED ORDER — ROSUVASTATIN CALCIUM 5 MG PO TABS
5.0000 mg | ORAL_TABLET | Freq: Every day | ORAL | Status: DC
  Administered 2024-06-18: 5 mg via ORAL
  Filled 2024-06-18: qty 1

## 2024-06-18 MED ORDER — LISINOPRIL 10 MG PO TABS
30.0000 mg | ORAL_TABLET | Freq: Every day | ORAL | Status: DC
  Administered 2024-06-18: 30 mg via ORAL
  Filled 2024-06-18: qty 3
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
## 2024-06-18 NOTE — ED Notes (Signed)
 Patient currently singing christmas carols, very pleasant.

## 2024-06-19 ENCOUNTER — Encounter: Payer: Self-pay | Admitting: Nurse Practitioner

## 2024-06-19 ENCOUNTER — Inpatient Hospital Stay: Admission: AD | Admit: 2024-06-19 | Source: Intra-hospital | Attending: Psychiatry | Admitting: Psychiatry

## 2024-06-19 DIAGNOSIS — I959 Hypotension, unspecified: Principal | ICD-10-CM

## 2024-06-19 DIAGNOSIS — R600 Localized edema: Principal | ICD-10-CM

## 2024-06-19 DIAGNOSIS — R079 Chest pain, unspecified: Secondary | ICD-10-CM

## 2024-06-19 DIAGNOSIS — E7800 Pure hypercholesterolemia, unspecified: Secondary | ICD-10-CM | POA: Diagnosis present

## 2024-06-19 DIAGNOSIS — F209 Schizophrenia, unspecified: Principal | ICD-10-CM | POA: Diagnosis present

## 2024-06-19 DIAGNOSIS — E871 Hypo-osmolality and hyponatremia: Secondary | ICD-10-CM | POA: Insufficient documentation

## 2024-06-19 DIAGNOSIS — E119 Type 2 diabetes mellitus without complications: Secondary | ICD-10-CM

## 2024-06-19 DIAGNOSIS — F250 Schizoaffective disorder, bipolar type: Secondary | ICD-10-CM | POA: Diagnosis not present

## 2024-06-19 DIAGNOSIS — R000 Tachycardia, unspecified: Principal | ICD-10-CM

## 2024-06-19 DIAGNOSIS — I10 Essential (primary) hypertension: Secondary | ICD-10-CM | POA: Diagnosis present

## 2024-06-19 LAB — RESP PANEL BY RT-PCR (RSV, FLU A&B, COVID)  RVPGX2
Influenza A by PCR: NEGATIVE
Influenza B by PCR: NEGATIVE
Resp Syncytial Virus by PCR: NEGATIVE
SARS Coronavirus 2 by RT PCR: NEGATIVE

## 2024-06-19 MED ORDER — OLANZAPINE 5 MG PO TBDP
5.0000 mg | ORAL_TABLET | Freq: Three times a day (TID) | ORAL | Status: AC | PRN
  Administered 2024-06-26 – 2024-08-15 (×11): 5 mg via ORAL
  Filled 2024-06-19 (×10): qty 1

## 2024-06-19 MED ORDER — HALOPERIDOL 1 MG PO TABS
0.5000 mg | ORAL_TABLET | Freq: Two times a day (BID) | ORAL | Status: DC
  Filled 2024-06-19 (×4): qty 1

## 2024-06-19 MED ORDER — PROPRANOLOL HCL 40 MG PO TABS
40.0000 mg | ORAL_TABLET | Freq: Two times a day (BID) | ORAL | Status: DC
  Administered 2024-06-19 – 2024-07-08 (×31): 40 mg via ORAL
  Filled 2024-06-19 (×41): qty 1

## 2024-06-19 MED ORDER — ACETAMINOPHEN 325 MG PO TABS: 650.0000 mg | ORAL_TABLET | Freq: Four times a day (QID) | ORAL | Status: DC | PRN

## 2024-06-19 MED ORDER — CLOZAPINE 25 MG PO TABS
50.0000 mg | ORAL_TABLET | Freq: Two times a day (BID) | ORAL | Status: DC
  Administered 2024-06-26: 10:00:00 50 mg via ORAL
  Filled 2024-06-19 (×15): qty 2

## 2024-06-19 MED ORDER — FLUTICASONE PROPIONATE 50 MCG/ACT NA SUSP
1.0000 | Freq: Every day | NASAL | Status: DC
  Filled 2024-06-19: qty 16

## 2024-06-19 MED ORDER — LINAGLIPTIN 5 MG PO TABS
5.0000 mg | ORAL_TABLET | Freq: Every day | ORAL | Status: DC
  Administered 2024-06-19 – 2024-07-14 (×25): 5 mg via ORAL
  Filled 2024-06-19 (×22): qty 1

## 2024-06-19 MED ORDER — MAGNESIUM HYDROXIDE 400 MG/5ML PO SUSP
30.0000 mL | Freq: Every day | ORAL | Status: AC | PRN
  Administered 2024-08-06: 30 mL via ORAL
  Filled 2024-06-19: qty 30

## 2024-06-19 MED ORDER — LISINOPRIL 20 MG PO TABS
30.0000 mg | ORAL_TABLET | Freq: Every day | ORAL | Status: DC
  Administered 2024-06-19 – 2024-07-08 (×18): 30 mg via ORAL
  Filled 2024-06-19 (×21): qty 2

## 2024-06-19 MED ORDER — DULOXETINE HCL 20 MG PO CPEP
20.0000 mg | ORAL_CAPSULE | Freq: Every day | ORAL | Status: DC
  Administered 2024-06-19 – 2024-06-22 (×4): 20 mg via ORAL
  Filled 2024-06-19 (×5): qty 1

## 2024-06-19 MED ORDER — METFORMIN HCL 500 MG PO TABS
1000.0000 mg | ORAL_TABLET | Freq: Every day | ORAL | Status: DC
  Administered 2024-06-19 – 2024-07-08 (×19): 1000 mg via ORAL
  Administered 2024-07-09: 500 mg via ORAL
  Administered 2024-07-10 – 2024-07-14 (×4): 1000 mg via ORAL
  Filled 2024-06-19 (×22): qty 2

## 2024-06-19 MED ORDER — GABAPENTIN 300 MG PO CAPS
300.0000 mg | ORAL_CAPSULE | Freq: Three times a day (TID) | ORAL | Status: AC
  Administered 2024-06-19 – 2024-08-18 (×177): 300 mg via ORAL
  Filled 2024-06-19 (×163): qty 1

## 2024-06-19 MED ORDER — ADULT MULTIVITAMIN W/MINERALS CH
1.0000 | ORAL_TABLET | Freq: Every day | ORAL | Status: AC
  Administered 2024-06-19 – 2024-07-10 (×22): 1 via ORAL
  Administered 2024-07-12: 0.5 via ORAL
  Administered 2024-07-13 – 2024-08-18 (×37): 1 via ORAL
  Filled 2024-06-19 (×54): qty 1

## 2024-06-19 MED ORDER — TRAZODONE HCL 50 MG PO TABS
50.0000 mg | ORAL_TABLET | Freq: Every evening | ORAL | Status: AC | PRN
  Administered 2024-07-20 – 2024-08-13 (×6): 50 mg via ORAL
  Filled 2024-06-19 (×12): qty 1

## 2024-06-19 MED ORDER — HYDROXYZINE HCL 25 MG PO TABS
25.0000 mg | ORAL_TABLET | Freq: Three times a day (TID) | ORAL | Status: AC | PRN
  Administered 2024-06-23 – 2024-08-16 (×19): 25 mg via ORAL
  Filled 2024-06-19 (×21): qty 1

## 2024-06-19 MED ORDER — ONDANSETRON HCL 4 MG PO TABS
4.0000 mg | ORAL_TABLET | Freq: Three times a day (TID) | ORAL | Status: AC | PRN
  Administered 2024-07-11 – 2024-08-18 (×5): 4 mg via ORAL
  Filled 2024-06-19 (×4): qty 1

## 2024-06-19 MED ORDER — OLANZAPINE 10 MG IM SOLR
5.0000 mg | Freq: Three times a day (TID) | INTRAMUSCULAR | Status: AC | PRN
  Administered 2024-06-22: 5 mg via INTRAMUSCULAR
  Filled 2024-06-19: qty 10

## 2024-06-19 MED ORDER — ALUM & MAG HYDROXIDE-SIMETH 200-200-20 MG/5ML PO SUSP
30.0000 mL | ORAL | Status: DC | PRN
  Administered 2024-06-30 – 2024-07-07 (×11): 30 mL via ORAL
  Filled 2024-06-19 (×12): qty 30

## 2024-06-19 MED ORDER — ASPIRIN 81 MG PO TBEC
81.0000 mg | DELAYED_RELEASE_TABLET | Freq: Every day | ORAL | Status: AC
  Administered 2024-06-19 – 2024-08-18 (×60): 81 mg via ORAL
  Filled 2024-06-19 (×54): qty 1

## 2024-06-19 MED ORDER — ACETAMINOPHEN ER 650 MG PO TBCR: 650.0000 mg | EXTENDED_RELEASE_TABLET | Freq: Three times a day (TID) | ORAL | Status: DC | PRN

## 2024-06-19 MED ORDER — POTASSIUM CHLORIDE CRYS ER 20 MEQ PO TBCR
20.0000 meq | EXTENDED_RELEASE_TABLET | Freq: Two times a day (BID) | ORAL | Status: AC
  Administered 2024-06-19 – 2024-08-18 (×119): 20 meq via ORAL
  Filled 2024-06-19 (×108): qty 1

## 2024-06-19 MED ORDER — ROSUVASTATIN CALCIUM 10 MG PO TABS
5.0000 mg | ORAL_TABLET | Freq: Every day | ORAL | Status: AC
  Administered 2024-06-19 – 2024-08-18 (×59): 5 mg via ORAL
  Filled 2024-06-19 (×13): qty 1
  Filled 2024-06-19: qty 0.5
  Filled 2024-06-19 (×40): qty 1

## 2024-06-19 NOTE — Tx Team (Signed)
 Initial Treatment Plan 06/19/2024 10:00 AM [name] FMW:[id number]    PATIENT STRESSORS: Medication change or noncompliance     PATIENT STRENGTHS: Ability for insight    PATIENT IDENTIFIED PROBLEMS:   Anxiety                    DISCHARGE CRITERIA:  Ability to meet basic life and health needs  PRELIMINARY DISCHARGE PLAN: Return to previous living arrangement  PATIENT/FAMILY INVOLVEMENT: This treatment plan has been presented to and reviewed with the patient, Valerie [name], The patient and family have been given the opportunity to ask questions and make suggestions.  Zoila Ditullio B Danasha Melman, RN 06/19/2024, 10:00 AM

## 2024-06-19 NOTE — Group Note (Signed)
 Date:  06/19/2024 Time:  4:45 PM  Group Topic/Focus:  Making Healthy Choices:   The focus of this group is to help patients identify negative/unhealthy choices they were using prior to admission and identify positive/healthier coping strategies to replace them upon discharge.    Participation Level:  Active  Participation Quality:  Appropriate  Affect:  Appropriate  Cognitive:  Appropriate  Insight: Appropriate  Engagement in Group:  Engaged  Modes of Intervention:  Discussion   Arland Nutting 06/19/2024, 4:45 PM

## 2024-06-19 NOTE — Group Note (Signed)
 Date:  06/19/2024 Time:  9:44 PM  Group Topic/Focus:  Wrap-Up Group:   The focus of this group is to help patients review their daily goal of treatment and discuss progress on daily workbooks.    Participation Level:  Minimal  Participation Quality:  Redirectable  Affect:  Anxious, Blunted, and Defensive  Cognitive:  Oriented  Insight: Limited  Engagement in Group:  Distracting, Limited, and Off Topic  Modes of Intervention:  Discussion  Additional Comments:    Valerie [name] B[place]er 06/19/2024, 9:44 PM

## 2024-06-19 NOTE — BH IP Treatment Plan (Signed)
 Interdisciplinary Treatment and Diagnostic Plan Update  06/19/2024 Time of Session: 10:27 AM  Valerie [name] MRN: [id number]  Principal Diagnosis: Schizophrenia [facility])  Secondary Diagnoses: Principal Problem:   Schizophrenia (HCC)   Current Medications:  Current Facility-Administered Medications  Medication Dose Route Frequency Provider Last Rate Last Admin   acetaminophen  (TYLENOL ) tablet 650 mg  650 mg Oral Q6H PRN Bobbitt, Shalon E, NP       alum & mag hydroxide-simeth (MAALOX/MYLANTA) 200-200-20 MG/5ML suspension 30 mL  30 mL Oral Q4H PRN Bobbitt, Shalon E, NP       aspirin  EC tablet 81 mg  81 mg Oral Daily Bobbitt, Shalon E, NP   81 mg at 06/19/24 0844   cloZAPine  (CLOZARIL ) tablet 50 mg  50 mg Oral BID Bobbitt, Shalon E, NP       DULoxetine  (CYMBALTA ) DR capsule 20 mg  20 mg Oral Daily Bobbitt, Shalon E, NP   20 mg at 06/19/24 0846   fluticasone  (FLONASE ) 50 MCG/ACT nasal spray 1 spray  1 spray Each Nare Daily Bobbitt, Shalon E, NP       gabapentin  (NEURONTIN ) capsule 300 mg  300 mg Oral TID Bobbitt, Shalon E, NP   300 mg at 06/19/24 0844   haloperidol  (HALDOL ) tablet 0.5 mg  0.5 mg Oral BID Bobbitt, Shalon E, NP       hydrOXYzine  (ATARAX ) tablet 25 mg  25 mg Oral TID PRN Bobbitt, Shalon E, NP       linagliptin  (TRADJENTA ) tablet 5 mg  5 mg Oral Daily Bobbitt, Shalon E, NP   5 mg at 06/19/24 0844   lisinopril  (ZESTRIL ) tablet 30 mg  30 mg Oral Daily Bobbitt, Shalon E, NP   30 mg at 06/19/24 0845   magnesium  hydroxide (MILK OF MAGNESIA) suspension 30 mL  30 mL Oral Daily PRN Bobbitt, Shalon E, NP       metFORMIN  (GLUCOPHAGE ) tablet 1,000 mg  1,000 mg Oral Q supper Bobbitt, Shalon E, NP       multivitamin with minerals tablet 1 tablet  1 tablet Oral Daily Bobbitt, Shalon E, NP   1 tablet at 06/19/24 0844   OLANZapine  (ZYPREXA ) injection 5 mg  5 mg Intramuscular TID PRN Bobbitt, Shalon E, NP       OLANZapine  zydis (ZYPREXA ) disintegrating tablet 5 mg  5 mg Oral TID PRN Bobbitt,  Shalon E, NP       ondansetron  (ZOFRAN ) tablet 4 mg  4 mg Oral Q8H PRN Bobbitt, Shalon E, NP       potassium chloride  SA (KLOR-CON  M) CR tablet 20 mEq  20 mEq Oral BID Bobbitt, Shalon E, NP       propranolol  (INDERAL ) tablet 40 mg  40 mg Oral BID Bobbitt, Shalon E, NP   40 mg at 06/19/24 0846   rosuvastatin  (CRESTOR ) tablet 5 mg  5 mg Oral Daily Bobbitt, Shalon E, NP   5 mg at 06/19/24 0845   traZODone  (DESYREL ) tablet 50 mg  50 mg Oral QHS PRN Bobbitt, Shalon E, NP       PTA Medications: Medications Prior to Admission  Medication Sig Dispense Refill Last Dose/Taking   acetaminophen  (TYLENOL ) 650 MG CR tablet Take 650 mg by mouth every 8 (eight) hours as needed for pain.      benztropine (COGENTIN) 0.5 MG tablet Take by mouth.      bisacodyl  5 MG EC tablet As directed for procedure 8 tablet 0    Cholecalciferol  (VITAMIN D ) 125 MCG (5000 UT)  CAPS Take 1,000 Units by mouth once a week.      cloZAPine  (CLOZARIL ) 100 MG tablet Take 200 mg by mouth 2 (two) times daily.      DULoxetine  (CYMBALTA ) 20 MG capsule Take 20 mg by mouth daily.      fluticasone  (FLONASE ) 50 MCG/ACT nasal spray Place 1 spray into both nostrils daily.      gabapentin  (NEURONTIN ) 300 MG capsule Take 300 mg by mouth 3 (three) times daily.      haloperidol  (HALDOL ) 0.5 MG tablet Take 0.5 mg by mouth 2 (two) times daily.      hydrochlorothiazide  (HYDRODIURIL ) 25 MG tablet Take 25 mg by mouth daily.      lisinopril  (ZESTRIL ) 30 MG tablet Take 30 mg by mouth daily.      loperamide (IMODIUM) 2 MG capsule Take 2 mg by mouth as needed for diarrhea or loose stools. Take 1 capsule by mouth as needed with each loose stool/diarrhea nte 8 doses in 24 hr      Multiple Vitamin (DAILY VITE PO) Take 1 tablet by mouth daily.      naproxen  (NAPROSYN ) 500 MG tablet Take 500 mg by mouth every 12 (twelve) hours as needed.      omeprazole (PRILOSEC) 20 MG capsule Take 20 mg by mouth daily.      ondansetron  (ZOFRAN ) 4 MG tablet Take 4 mg by mouth  every 8 (eight) hours as needed for nausea or vomiting.      potassium chloride  SA (KLOR-CON  M) 20 MEQ tablet Take 20 mEq by mouth 2 (two) times daily.      propranolol  (INDERAL ) 40 MG tablet Take 40 mg by mouth 2 (two) times daily.      rosuvastatin  (CRESTOR ) 5 MG tablet Take 5 mg by mouth daily.      sodium phosphate  (FLEET) ENEM As directed for procedure 133 mL 0    triamcinolone ointment (KENALOG) 0.1 % Apply topically.       Patient Stressors: Medication change or noncompliance    Patient Strengths: Ability for insight   Treatment Modalities: Medication Management, Group therapy, Case management,  1 to 1 session with clinician, Psychoeducation, Recreational therapy.   Physician Treatment Plan for Primary Diagnosis: Schizophrenia (HCC) Long Term Goal(s):     Short Term Goals:    Medication Management: Evaluate patient's response, side effects, and tolerance of medication regimen.  Therapeutic Interventions: 1 to 1 sessions, Unit Group sessions and Medication administration.  Evaluation of Outcomes: Not Progressing  Physician Treatment Plan for Secondary Diagnosis: Principal Problem:   Schizophrenia (HCC)  Long Term Goal(s):     Short Term Goals:       Medication Management: Evaluate patient's response, side effects, and tolerance of medication regimen.  Therapeutic Interventions: 1 to 1 sessions, Unit Group sessions and Medication administration.  Evaluation of Outcomes: Not Progressing   RN Treatment Plan for Primary Diagnosis: Schizophrenia (HCC) Long Term Goal(s): Knowledge of disease and therapeutic regimen to maintain health will improve  Short Term Goals: Ability to remain free from injury will improve, Ability to verbalize frustration and anger appropriately will improve, Ability to demonstrate self-control, Ability to participate in decision making will improve, Ability to verbalize feelings will improve, Ability to disclose and discuss suicidal ideas, Ability  to identify and develop effective coping behaviors will improve, and Compliance with prescribed medications will improve  Medication Management: RN will administer medications as ordered by provider, will assess and evaluate patient's response and provide education to patient for prescribed  medication. RN will report any adverse and/or side effects to prescribing provider.  Therapeutic Interventions: 1 on 1 counseling sessions, Psychoeducation, Medication administration, Evaluate responses to treatment, Monitor vital signs and CBGs as ordered, Perform/monitor CIWA, COWS, AIMS and Fall Risk screenings as ordered, Perform wound care treatments as ordered.  Evaluation of Outcomes: Not Progressing   LCSW Treatment Plan for Primary Diagnosis: Schizophrenia (HCC) Long Term Goal(s): Safe transition to appropriate next level of care at discharge, Engage patient in therapeutic group addressing interpersonal concerns.  Short Term Goals: Engage patient in aftercare planning with referrals and resources, Increase social support, Increase ability to appropriately verbalize feelings, Increase emotional regulation, Facilitate acceptance of mental health diagnosis and concerns, Facilitate patient progression through stages of change regarding substance use diagnoses and concerns, Identify triggers associated with mental health/substance abuse issues, and Increase skills for wellness and recovery  Therapeutic Interventions: Assess for all discharge needs, 1 to 1 time with Social worker, Explore available resources and support systems, Assess for adequacy in community support network, Educate family and significant other(s) on suicide prevention, Complete Psychosocial Assessment, Interpersonal group therapy.  Evaluation of Outcomes: Not Progressing   Progress in Treatment: Attending groups: Yes. and No. Participating in groups: Yes. and No. Taking medication as prescribed: Yes. Toleration medication:  Yes. Family/Significant other contact made: No, will contact:  CSW will contact if given permission  Patient understands diagnosis: Yes. and No. Discussing patient identified problems/goals with staff: Yes. and No. Medical problems stabilized or resolved: Yes. Denies suicidal/homicidal ideation: Yes. Issues/concerns per patient self-inventory: No. Other: None   New problem(s) identified: No, Describe:  None identified   New Short Term/Long Term Goal(s):  elimination of symptoms of psychosis, medication management for mood stabilization; elimination of SI thoughts; development of comprehensive mental wellness plan.   Patient Goals:  To continue to get well  Discharge Plan or Barriers: CSW will assist with appropriate discharge planning   Reason for Continuation of Hospitalization: Delusions  Mania Medication stabilization  Estimated Length of Stay:  1 to 7 days  Last 3 [place] Suicide Severity Risk Score: Flowsheet Row Admission (Current) from 06/19/2024 in [facility] ED from 06/18/2024 in [facility] at [facility] ED from 05/24/2022 in [facility] Emergency Department at [facility]  C-SSRS RISK CATEGORY No Risk No Risk No Risk    Last PHQ 2/9 Scores:    12/21/2022    9:00 AM  Depression screen PHQ 2/9  Decreased Interest 0  Down, Depressed, Hopeless 0  PHQ - 2 Score 0    Scribe for Treatment Team: Lum JONETTA Croft, ISRAEL 06/19/2024 2:50 PM
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
## 2024-06-19 NOTE — Plan of Care (Signed)
   Problem: Education: Goal: Emotional status will improve Outcome: Progressing

## 2024-06-19 NOTE — Group Note (Signed)
 Recreation Therapy Group Note   Group Topic:Other  Group Date: 06/19/2024 Start Time: 1415 End Time: 1500 Facilitators: Celestia Jeoffrey BRAVO, LRT, CTRS Location: Dayroom  Activity Description/Intervention: Therapeutic Drumming. Patients with peers and staff were given the opportunity to engage in a leader facilitated [facility] Group Empowerment Drumming Circle with staff from the [employer], in partnership with [employer]. Teaching laboratory technician and trained [place], Norleen Mon leading with LRT observing and documenting intervention and pt response. This evidenced-based practice targets 7 areas of health and wellbeing in the human experience including: stress-reduction, exercise, self-expression, camaraderie/support, nurturing, spirituality, and music-making (leisure).    Goal Area(s) Addresses:  Patient will engage in pro-social way in music group.  Patient will follow directions of drum leader on the first prompt. Patient will demonstrate no behavioral issues during group.  Patient will identify if a reduction in stress level occurs as a result of participation in therapeutic drum circle.   Affect/Mood: Elevated   Participation Level: Active and Engaged   Participation Quality: Minimal Cues   Behavior: Cooperative   Speech/Thought Process: Directed   Insight: Fair   Judgement: Limited   Modes of Intervention: Music   Patient Response to Interventions:  Receptive   Education Outcome:  Acknowledges education   Clinical Observations/Individualized Feedback: Valerie [name] was active in their participation of session activities and group discussion.    Plan: Continue to engage patient in RT group sessions 2-3x/week.   Jeoffrey BRAVO Celestia, LRT, CTRS 06/19/2024 5:07 PM

## 2024-06-19 NOTE — H&P (Signed)
 Psychiatric Admission Assessment Adult  Patient Identification: Valerie [name] MRN:  [id number] Date of Evaluation:  06/20/2024 Chief Complaint:  Schizophrenia (HCC) [F20.9]   History of Present Illness: Valerie [name] is a 63 year old female presenting voluntary to [facility] requesting a psychiatric evaluation for worsening psychosis. Patient has history of schizophrenia. Patient denied SI, HI, psychosis and alcohol/drug usage. Patient resides at [facility]. Patient reports people are coming by my door talking loud, yelling and screaming and coming in my room with keys. Patient reports having multiple medical Afibs and blood clots due to this. Patient reports people are doing things in the dining room. When asked if she called EMS, patient stated I called EMS, I stripped searched over the phone and called the authorities. Patient is admitted to [facility] unit with Q15 min safety monitoring. Multidisciplinary team approach is offered. Medication management; group/milieu therapy is offered.  On interview patient is noted to be euphoric with elated mood, increased energy.  She reports she has been living for the last 15 years at the assisted living facility and they do not treat her well.  When asked to give details she reports that they keeps slamming the doors, talking loud and few residents threatened her.  She rates her depression as 4 out of 10, 10 being the worst, anxiety is 4 out of 10.  She denies any panic attacks.  She remains delusional and talks about having an episode of A-fib for 10 minutes yesterday but is unable to give any more details.  She also reports that she had cardiac arrest for 7 days and she was mistreated by the doctors.  She denies SI/HI/plan.  She reports poor sleep but fair appetite good energy and motivation.  She is displaying grandiose delusions as she reports that she is here to marry a [place] and famous man.  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  She denies nightmares or flashbacks. Total Time spent with patient: 1 hour Sleep  Sleep:Sleep: Fair  Past Psychiatric History:  Psychiatric History:  Information collected from patient/chart  Prev Dx/Sx: schizophrenia Current Psych Provider: yes but unknown of the details Home Meds (current): clozaril , propranolol , lisinopril , Pepcid , Flomax, Cymbalta , metformin  Previous Med Trials: unknown Therapy: unknown  Prior Psych Hospitalization: few yrs ago  Prior Self Harm: denies Prior Violence: denies  Family Psych History: denies Family Hx suicide: denies  Social History:   Educational Hx: bachelors Occupational Hx: on disability Legal Hx: denies Living Situation: in assisted living Spiritual Hx: unknown Access to weapons/lethal means: denies   Substance History Alcohol: denies   Tobacco: denies Illicit drugs: denies Prescription drug abuse: denies Rehab hx: denies Is the patient at risk to self? No.  Has the patient been a risk to self in the past 6 months? No.  Has the patient been a risk to self within the distant past? No.  Is the patient a risk to others? No.  Has the patient been a risk to others in the past 6 months? No.  Has the patient been a risk to others within the distant past? No.   Columbia Scale:  Flowsheet Row Admission (Current) from 06/19/2024 in [facility] ED from 06/18/2024 in [facility] Emergency Department at [facility] ED from 05/24/2022 in [facility] Emergency Department at [facility]  C-SSRS RISK CATEGORY No Risk No Risk No Risk     Past Medical History:  Past Medical History:  Diagnosis Date   Chronic  lower extremity pain (2ry area of Pain) (Right) 12/21/2022   Chronic pain syndrome 12/20/2022   DDD (degenerative disc disease), lumbosacral 12/21/2022   HTN (hypertension)    Hypercholesteremia    Prediabetes    Schizophrenia (HCC)     Past Surgical History:  Procedure  Laterality Date   COLONOSCOPY  2014   Dr. Tobie: Pancolonic diverticulosis, tortuous colon, next colonoscopy 2024   TONSILLECTOMY     Family History:  Family History  Problem Relation Age of Onset   Colon cancer Neg Hx    Celiac disease Neg Hx    Inflammatory bowel disease Neg Hx     Social History:  Social History   Substance and Sexual Activity  Alcohol Use Never     Social History   Substance and Sexual Activity  Drug Use Never      Allergies:   Allergies  Allergen Reactions   Other     Salmon-esophagus swelled   Risperidone     Other Reaction(s): fever and flu like symptoms   Risperidone And Paliperidone    Lab Results:  Results for orders placed or performed during the hospital encounter of 06/18/24 (from the past 48 hours)  Urine rapid drug screen (hosp performed)     Status: None   Collection Time: 06/18/24  8:05 PM  Result Value Ref Range   Opiates NEGATIVE NEGATIVE   Cocaine NEGATIVE NEGATIVE   Benzodiazepines NEGATIVE NEGATIVE   Amphetamines NEGATIVE NEGATIVE   Tetrahydrocannabinol NEGATIVE NEGATIVE   Barbiturates NEGATIVE NEGATIVE   Methadone Scn, Ur NEGATIVE NEGATIVE   Fentanyl NEGATIVE NEGATIVE    Comment: (NOTE) Drug screen is for Medical Purposes only. Positive results are preliminary only. If confirmation is needed, notify lab within 5 days.  Drug Class                 Cutoff (ng/mL) Amphetamine and metabolites 1000 Barbiturate and metabolites 200 Benzodiazepine              200 Opiates and metabolites     300 Cocaine and metabolites     300 THC                         50 Fentanyl                    5 Methadone                   300  Trazodone  is metabolized in vivo to several metabolites,  including pharmacologically active m-CPP, which is excreted in the  urine.  Immunoassay screens for amphetamines and MDMA have potential  cross-reactivity with these compounds and may provide false positive  result.  Performed at [facility], [address] [place], [place] [address]   Comprehensive metabolic panel     Status: Abnormal   Collection Time: 06/18/24  8:26 PM  Result Value Ref Range   Sodium 139 135 - 145 mmol/L   Potassium 3.8 3.5 - 5.1 mmol/L   Chloride 103 98 - 111 mmol/L   CO2 32 22 - 32 mmol/L   Glucose, Bld 108 (H) 70 - 99 mg/dL    Comment: Glucose reference range applies only to samples taken after fasting for at least 8 hours.   BUN 6 (L) 8 - 23 mg/dL   Creatinine, Ser 9.13 0.44 - 1.00 mg/dL   Calcium  8.9 8.9 - 10.3 mg/dL   Total Protein 6.6 6.5 - 8.1  g/dL   Albumin 3.9 3.5 - 5.0 g/dL   AST 17 15 - 41 U/L   ALT 10 0 - 44 U/L   Alkaline Phosphatase 117 38 - 126 U/L   Total Bilirubin 0.2 0.0 - 1.2 mg/dL   GFR, Estimated >39 >39 mL/min    Comment: (NOTE) Calculated using the CKD-EPI Creatinine Equation (2021)    Anion gap 5 5 - 15    Comment: Performed at [facility], [address], [place], [place] [address]  Ethanol     Status: None   Collection Time: 06/18/24  8:26 PM  Result Value Ref Range   Alcohol, Ethyl (B) <15 <15 mg/dL    Comment: (NOTE) For medical purposes only. Performed at [facility], [address], [place], [place] [address]   CBC with Diff     Status: None   Collection Time: 06/18/24  8:26 PM  Result Value Ref Range   WBC 8.0 4.0 - 10.5 K/uL   RBC 4.48 3.87 - 5.11 MIL/uL   Hemoglobin 12.7 12.0 - 15.0 g/dL   HCT 60.2 63.9 - 53.9 %   MCV 88.6 80.0 - 100.0 fL   MCH 28.3 26.0 - 34.0 pg   MCHC 32.0 30.0 - 36.0 g/dL   RDW 86.2 88.4 - 84.4 %   Platelets 354 150 - 400 K/uL   nRBC 0.0 0.0 - 0.2 %   Neutrophils Relative % 48 %   Neutro Abs 3.8 1.7 - 7.7 K/uL   Lymphocytes Relative 38 %   Lymphs Abs 3.0 0.7 - 4.0 K/uL   Monocytes Relative 10 %   Monocytes Absolute 0.8 0.1 - 1.0 K/uL   Eosinophils Relative 3 %   Eosinophils Absolute 0.3 0.0 - 0.5 K/uL   Basophils Relative 1 %   Basophils Absolute 0.1 0.0 - 0.1 K/uL   Immature Granulocytes 0 %   Abs Immature Granulocytes  0.01 0.00 - 0.07 K/uL    Comment: Performed at [facility], [address], [place], [place] [address]  Resp panel by RT-PCR (RSV, Flu A&B, Covid) Anterior Nasal Swab     Status: None   Collection Time: 06/19/24  3:45 AM   Specimen: Anterior Nasal Swab  Result Value Ref Range   SARS Coronavirus 2 by RT PCR NEGATIVE NEGATIVE    Comment: (NOTE) SARS-CoV-2 target nucleic acids are NOT DETECTED.  The SARS-CoV-2 RNA is generally detectable in upper respiratory specimens during the acute phase of infection. The lowest concentration of SARS-CoV-2 viral copies this assay can detect is 138 copies/mL. A negative result does not preclude SARS-Cov-2 infection and should not be used as the sole basis for treatment or other patient management decisions. A negative result may occur with  improper specimen collection/handling, submission of specimen other than nasopharyngeal swab, presence of viral mutation(s) within the areas targeted by this assay, and inadequate number of viral copies(<138 copies/mL). A negative result must be combined with clinical observations, patient history, and epidemiological information. The expected result is Negative.  Fact Sheet for Patients:  bloggercourse.com  Fact Sheet for Healthcare Providers:  seriousbroker.it  This test is no t yet approved or cleared by the [place]  FDA and  has been authorized for detection and/or diagnosis of SARS-CoV-2 by FDA under an Emergency Use Authorization (EUA). This EUA will remain  in effect (meaning this test can be used) for the duration of the COVID-19 declaration under Section 564(b)(1) of the Act, 21 U.S.C.section 360bbb-3(b)(1), unless the authorization is terminated  or  revoked sooner.       Influenza A by PCR NEGATIVE NEGATIVE   Influenza B by PCR NEGATIVE NEGATIVE    Comment: (NOTE) The Xpert Xpress SARS-CoV-2/FLU/RSV plus assay is intended as an aid in the  diagnosis of influenza from Nasopharyngeal swab specimens and should not be used as a sole basis for treatment. Nasal washings and aspirates are unacceptable for Xpert Xpress SARS-CoV-2/FLU/RSV testing.  Fact Sheet for Patients: bloggercourse.com  Fact Sheet for Healthcare Providers: seriousbroker.it  This test is not yet approved or cleared by the [place]  FDA and has been authorized for detection and/or diagnosis of SARS-CoV-2 by FDA under an Emergency Use Authorization (EUA). This EUA will remain in effect (meaning this test can be used) for the duration of the COVID-19 declaration under Section 564(b)(1) of the Act, 21 U.S.C. section 360bbb-3(b)(1), unless the authorization is terminated or revoked.     Resp Syncytial Virus by PCR NEGATIVE NEGATIVE    Comment: (NOTE) Fact Sheet for Patients: bloggercourse.com  Fact Sheet for Healthcare Providers: seriousbroker.it  This test is not yet approved or cleared by the [place]  FDA and has been authorized for detection and/or diagnosis of SARS-CoV-2 by FDA under an Emergency Use Authorization (EUA). This EUA will remain in effect (meaning this test can be used) for the duration of the COVID-19 declaration under Section 564(b)(1) of the Act, 21 U.S.C. section 360bbb-3(b)(1), unless the authorization is terminated or revoked.  Performed at [facility], [address], [place], [place] [address]     Blood Alcohol level:  Lab Results  Component Value Date   [facility] <15 06/18/2024    Metabolic Disorder Labs:  No results found for: HGBA1C, MPG No results found for: PROLACTIN No results found for: CHOL, TRIG, HDL, CHOLHDL, VLDL, LDLCALC  Current Medications: Current Facility-Administered Medications  Medication Dose Route Frequency Provider Last Rate Last Admin   acetaminophen  (TYLENOL ) tablet 650 mg   650 mg Oral Q6H PRN Rodgers Likes, MD       alum & mag hydroxide-simeth (MAALOX/MYLANTA) 200-200-20 MG/5ML suspension 30 mL  30 mL Oral Q4H PRN Bobbitt, Shalon E, NP       aspirin  EC tablet 81 mg  81 mg Oral Daily Bobbitt, Shalon E, NP   81 mg at 06/20/24 9045   bisacodyl  (DULCOLAX) EC tablet 10 mg  10 mg Oral Daily PRN Bernardina Cacho, MD       cholecalciferol  (VITAMIN D3) 25 MCG (1000 UNIT) tablet 1,000 Units  1,000 Units Oral Weekly Marymargaret Kirker, MD   1,000 Units at 06/20/24 1232   cloZAPine  (CLOZARIL ) tablet 50 mg  50 mg Oral BID Bobbitt, Shalon E, NP       diclofenac  Sodium (VOLTAREN ) 1 % topical gel 4 g  4 g Topical QID Fujie Dickison, MD       DULoxetine  (CYMBALTA ) DR capsule 20 mg  20 mg Oral Daily Bobbitt, Shalon E, NP   20 mg at 06/20/24 0957   famotidine  (PEPCID ) tablet 20 mg  20 mg Oral Daily Fannie Gathright, MD   20 mg at 06/20/24 1232   fluticasone  (FLONASE ) 50 MCG/ACT nasal spray 1 spray  1 spray Each Nare Daily Bobbitt, Shalon E, NP       gabapentin  (NEURONTIN ) capsule 300 mg  300 mg Oral TID Bobbitt, Shalon E, NP   300 mg at 06/19/24 2122   haloperidol  (HALDOL ) tablet 0.5 mg  0.5 mg Oral BID Bobbitt, Shalon E, NP       hydrochlorothiazide  (HYDRODIURIL )  tablet 25 mg  25 mg Oral Daily Rogenia Werntz, MD   25 mg at 06/20/24 1232   hydrOXYzine  (ATARAX ) tablet 25 mg  25 mg Oral TID PRN Bobbitt, Shalon E, NP       linagliptin  (TRADJENTA ) tablet 5 mg  5 mg Oral Daily Bobbitt, Shalon E, NP   5 mg at 06/20/24 9043   lisinopril  (ZESTRIL ) tablet 30 mg  30 mg Oral Daily Bobbitt, Shalon E, NP   30 mg at 06/20/24 9044   magnesium  hydroxide (MILK OF MAGNESIA) suspension 30 mL  30 mL Oral Daily PRN Bobbitt, Shalon E, NP       metFORMIN  (GLUCOPHAGE ) tablet 1,000 mg  1,000 mg Oral Q supper Bobbitt, Shalon E, NP   1,000 mg at 06/19/24 1658   multivitamin with minerals tablet 1 tablet  1 tablet Oral Daily Bobbitt, Shalon E, NP   1 tablet at 06/20/24 0957   naproxen  (NAPROSYN ) tablet 500 mg   500 mg Oral Q12H PRN Dayna Alia, MD       OLANZapine  (ZYPREXA ) injection 5 mg  5 mg Intramuscular TID PRN Bobbitt, Shalon E, NP       OLANZapine  zydis (ZYPREXA ) disintegrating tablet 5 mg  5 mg Oral TID PRN Bobbitt, Shalon E, NP       ondansetron  (ZOFRAN ) tablet 4 mg  4 mg Oral Q8H PRN Bobbitt, Shalon E, NP       potassium chloride  SA (KLOR-CON  M) CR tablet 20 mEq  20 mEq Oral BID Bobbitt, Shalon E, NP   20 mEq at 06/20/24 0951   propranolol  (INDERAL ) tablet 40 mg  40 mg Oral BID Bobbitt, Shalon E, NP   40 mg at 06/20/24 9041   rosuvastatin  (CRESTOR ) tablet 5 mg  5 mg Oral Daily Bobbitt, Shalon E, NP   5 mg at 06/20/24 9048   traZODone  (DESYREL ) tablet 50 mg  50 mg Oral QHS PRN Bobbitt, Shalon E, NP       PTA Medications: Medications Prior to Admission  Medication Sig Dispense Refill Last Dose/Taking   acetaminophen  (TYLENOL ) 650 MG CR tablet Take 650 mg by mouth every 8 (eight) hours as needed for pain.      benztropine (COGENTIN) 0.5 MG tablet Take by mouth.      bisacodyl  5 MG EC tablet As directed for procedure 8 tablet 0    Cholecalciferol  (VITAMIN D ) 125 MCG (5000 UT) CAPS Take 1,000 Units by mouth once a week.      cloZAPine  (CLOZARIL ) 100 MG tablet Take 200 mg by mouth 2 (two) times daily.      DULoxetine  (CYMBALTA ) 20 MG capsule Take 20 mg by mouth daily.      fluticasone  (FLONASE ) 50 MCG/ACT nasal spray Place 1 spray into both nostrils daily.      gabapentin  (NEURONTIN ) 300 MG capsule Take 300 mg by mouth 3 (three) times daily.      haloperidol  (HALDOL ) 0.5 MG tablet Take 0.5 mg by mouth 2 (two) times daily.      hydrochlorothiazide  (HYDRODIURIL ) 25 MG tablet Take 25 mg by mouth daily.      lisinopril  (ZESTRIL ) 30 MG tablet Take 30 mg by mouth daily.      loperamide (IMODIUM) 2 MG capsule Take 2 mg by mouth as needed for diarrhea or loose stools. Take 1 capsule by mouth as needed with each loose stool/diarrhea nte 8 doses in 24 hr      Multiple Vitamin (DAILY VITE PO) Take 1  tablet by mouth  daily.      naproxen  (NAPROSYN ) 500 MG tablet Take 500 mg by mouth every 12 (twelve) hours as needed.      omeprazole (PRILOSEC) 20 MG capsule Take 20 mg by mouth daily.      ondansetron  (ZOFRAN ) 4 MG tablet Take 4 mg by mouth every 8 (eight) hours as needed for nausea or vomiting.      potassium chloride  SA (KLOR-CON  M) 20 MEQ tablet Take 20 mEq by mouth 2 (two) times daily.      propranolol  (INDERAL ) 40 MG tablet Take 40 mg by mouth 2 (two) times daily.      rosuvastatin  (CRESTOR ) 5 MG tablet Take 5 mg by mouth daily.      sodium phosphate  (FLEET) ENEM As directed for procedure 133 mL 0    triamcinolone ointment (KENALOG) 0.1 % Apply topically.       Psychiatric Specialty Exam:  Presentation  General Appearance:  Casual  Eye Contact: Fleeting  Speech: Normal Rate  Speech Volume: Increased    Mood and Affect  Mood: Euphoric  Affect: Labile   Thought Process  Thought Processes: Disorganized  Descriptions of Associations:Intact  Orientation:Full (Time, Place and Person)  Thought Content:Illogical; Delusions; Tangential  Hallucinations:Hallucinations: None  Ideas of Reference:Delusions  Suicidal Thoughts:Suicidal Thoughts: No  Homicidal Thoughts:Homicidal Thoughts: No   Sensorium  Memory: Immediate Fair; Remote Fair  Judgment: Impaired  Insight: Shallow   Executive Functions  Concentration: Poor  Attention Span: Poor  Recall: [employer] of Knowledge: Fair  Language: Fair   Psychomotor Activity  Psychomotor Activity: Psychomotor Activity: Normal   Assets  Assets: Communication Skills; Desire for Improvement    Musculoskeletal: Strength & Muscle Tone: within normal limits Gait & Station: normal  Physical Exam: Physical Exam Vitals and nursing note reviewed.  HENT:     Head: Normocephalic.     Nose: Nose normal.     Mouth/Throat:     Mouth: Mucous membranes are moist.  Cardiovascular:     Rate and  Rhythm: Normal rate.     Pulses: Normal pulses.  Pulmonary:     Effort: Pulmonary effort is normal.  Abdominal:     General: Abdomen is flat.  Neurological:     Mental Status: She is alert.    Review of Systems  Constitutional: Negative.   HENT: Negative.    Eyes: Negative.   Cardiovascular: Negative.   Skin: Negative.    Blood pressure (!) 141/76, pulse 88, temperature (!) 97.3 F (36.3 C), resp. rate 18, height 5' 5 (1.651 m), weight 81 kg, SpO2 100%. Body mass index is 29.7 kg/m.  Principal Diagnosis: Schizophrenia (HCC) Diagnosis:  Active Problems:   Schizoaffective disorder, bipolar type [facility])   Clinical Decision Making: Patient is currently admitted for worsening psychosis, manic symptoms, tangential grandiosity.  She needs inpatient hospitalization for medication management and close monitoring.  Treatment Plan Summary: Patient has legal guardian  Safety and Monitoring:             -- Voluntary admission to inpatient psychiatric unit for safety, stabilization and treatment             -- Daily contact with patient to assess and evaluate symptoms and progress in treatment             -- Patient's case to be discussed in multi-disciplinary team meeting             -- Observation Level: q15 minute checks             --  Vital signs:  q12 hours             -- Precautions: suicide, elopement, and assault   2. Psychiatric Diagnoses and Treatment:               Clozaril  50 mg twice daily Cymbalta  20 mg daily   -- The risks/benefits/side-effects/alternatives to this medication were discussed in detail with the patient and time was given for questions. The patient consents to medication trial.                -- Metabolic profile and EKG monitoring obtained while on an atypical antipsychotic (BMI: Lipid Panel: HbgA1c: QTc:)              -- Encouraged patient to participate in unit milieu and in scheduled group therapies                            3. Medical Issues Being  Addressed:      4. Discharge Planning:              -- Social work and case management to assist with discharge planning and identification of hospital follow-up needs prior to discharge             -- Estimated LOS: 5-7 days             -- Discharge Concerns: Need to establish a safety plan; Medication compliance and effectiveness             -- Discharge Goals: Return home with outpatient referrals follow ups  Physician Treatment Plan for Primary Diagnosis: Schizophrenia (HCC) Long Term Goal(s): Improvement in symptoms so as ready for discharge  Short Term Goals: Ability to identify changes in lifestyle to reduce recurrence of condition will improve, Ability to verbalize feelings will improve, Ability to disclose and discuss suicidal ideas, Ability to demonstrate self-control will improve, and Ability to identify and develop effective coping behaviors will improve  Physician Treatment Plan for Secondary Diagnosis: Active Problems:   Schizoaffective disorder, bipolar type (HCC)  Long Term Goal(s): Improvement in symptoms so as ready for discharge  Short Term Goals: Ability to identify changes in lifestyle to reduce recurrence of condition will improve, Ability to verbalize feelings will improve, Ability to disclose and discuss suicidal ideas, Ability to demonstrate self-control will improve, and Ability to identify and develop effective coping behaviors will improve  I certify that inpatient services furnished can reasonably be expected to improve the patient's condition.    Embry Huss, MD 12/9/202512:40 PM

## 2024-06-19 NOTE — ED Notes (Signed)
 Pt belongings placed in locker 4, her top,pants and a sweater

## 2024-06-19 NOTE — Progress Notes (Signed)
   06/19/24 2100  Psych Admission Type (Psych Patients Only)  Admission Status Voluntary  Psychosocial Assessment  Patient Complaints Confusion;Irritability  Eye Contact Intense;Glaring  Facial Expression Animated;Flat  Affect Preoccupied  Speech Argumentative  Interaction Assertive;Guarded;Demanding;Intrusive  Motor Activity Slow  Appearance/Hygiene Unremarkable  Behavior Characteristics Resistant to care;Intrusive;Guarded  Mood Preoccupied;Suspicious  Thought Process  Coherency WDL  Content Paranoia  Delusions Grandeur  Perception Derealization  Hallucination None reported or observed  Judgment Impaired  Confusion Mild  Danger to Self  Current suicidal ideation? Denies  Danger to Others  Danger to Others None reported or observed   Mood/Behavior:  Pt noted to be irritable and argumentative overnight. Pt noted to come to the nurses station with complaints of group leader talking too loud during group. Pt argumentative with med adminstration stating  I do not think you are a real nurse. Pt told that staff will do q15 minute safety checks. Pt noted to respond I will just sit in the hall because, I do not want you all opening my door every 15 minutes. Pt noted to sit in the chair in front of the nurses station. Pt noted to fall asleep intermittently. Pt would then get up requesting to have a shierff come and take her home. Pt repeatdly stating I cannot do this let me go home. Pt required several attempts of redirection. Pt either walks away or tells staff to get out of her room. Pt restless throughout the night preoccupied and parnoid. Pt refusing to take prn medication.   Psych assessment: Denies SI/HI and AVH.     Interaction / Group attendance:  Present in the milieu. Minimal interaction with peers and staff.  Attended group.   Medication/ PRNs: Non-compliant with some of  scheduled medications. Refused PRNs when offered. .   Pain: Denies   15 min checks in place for  safety.

## 2024-06-19 NOTE — Group Note (Signed)
 Date:  06/19/2024 Time:  10:51 AM  Group Topic/Focus:  Goals/ Christmas Bingo  Group:   The focus of this group is to help patients establish daily goals to achieve during treatment and discuss how the patient can incorporate goal setting into their daily lives to aide in recovery. Patients also played 2 rounds of bingo and won cytogeneticist.     Participation Level:  Active  Participation Quality:  Appropriate  Affect:  Appropriate  Cognitive:  Appropriate  Insight: Appropriate  Engagement in Group:  Engaged  Modes of Intervention:  Activity and Discussion  Additional Comments:    Valerie [name] 06/19/2024, 10:51 AM

## 2024-06-19 NOTE — BH Assessment (Addendum)
 Comprehensive Clinical Assessment (CCA) Note  06/19/2024 Valerie [name] [id number]  Disposition: Roxianne Olp, NP, recommends inpatient geropsychiatry. Disposition SW will secure placement.   The patient demonstrates the following risk factors for suicide: Chronic risk factors for suicide include: psychiatric disorder of schizophrenia. Acute risk factors for suicide include: social withdrawal/isolation and recent discharge from inpatient psychiatry. Protective factors for this patient include: responsibility to others (children, family) and hope for the future. Considering these factors, the overall suicide risk at this point appears to be high. Patient is not appropriate for outpatient follow up.  Valerie [name] is a 63 year old female presenting voluntary to [facility] requesting a psychiatric evaluation. Patient has history of schizophrenia. Patient denied SI, HI, psychosis and alcohol/drug usage. Patient resides at [facility].   Attempts made, Collateral contact, per Mart, [facility] staff member, patient was released earlier today from [facility] [employer] [place] [place], due to similar presentation. Valerie [name] reports her psychiatrist took her off of her Clozapine  and then restarted her on a lower dosage. Valerie [name] reports patients behaviors while off the medications went on a downward spiral and that patient has been delusional for 1 month. Valerie [name] reports earlier today patient was in her room and not answering staff when her name was called. Patient was in the bathroom sitting in shower in the dark. Another incident happened where patient was locked in the bathroom after a loud boom nose and as staff proceeded in the bathroom, patient then lunged at her fearfully. Valerie [name] reported patient is not acting like herself.   Patient reports people are coming by my door talking loud, yelling and screaming and coming in my room with keys. Patient reports having  multiple medical Afibs and blood clots due to this. Patient reports people are doing things in the dining room. When asked if she called EMS, patient stated I called EMS, I stripped searched over the phone and called the authorities. When asked if she had received inpatient treatment, patient stated yes for 27 years and 85 days because I would not go to a group home, I violated court orders. Patient was asked, do you feel you need inpatient treatment, patient stated yes so I can get married to a husband and have a good life, I deserve that. Patient reports she is looking for Valerie [name] to marry him and that his family is aware. When asked about hallucinations, patient reports having induced floaters. Patient states I don't want to be so heavenly bound that I am earthly no good. Patient randomly states someone came in my room 2x but was not successful, no additional information was given. Patient reports worsening depressive symptoms. Patient reports 3.5-4 hours nightly sleep and normal appetite.   Patient does not have a veterinary surgeon. Patient is currently seeing a psychiatrist at [facility] for medication management. Patient had a recent medication change 1 month ago. Patient denied prior suicide attempts and self-harming behaviors.   Patient reports has lived at the [facility] since 2012. Patient has 1 son which she reports having a good relationship. Patient reports she has been divorced since 12/1990. Patient reports she is retired. Patient denied access to guns. Patient was calm and cooperative during assessment.  Chief Complaint:  Chief Complaint  Patient presents with   Mental Health Problem   Visit Diagnosis:  Hx of schizophrenia Major Depressive Disorder    CCA Screening, Triage and Referral (STR)  Patient Reported Information How did you hear about  us ? Self  What Is the Reason for Your Visit/Call Today? Psychiatric Assessment  requested  How Long Has This Been Causing You Problems? 1 wk - 1 month  What Do You Feel Would Help You the Most Today? Treatment for Depression or other mood problem   Have You Recently Had Any Thoughts About Hurting Yourself? No  Are You Planning to Commit Suicide/Harm Yourself At This time? No   Flowsheet Row ED from [facility]/18/20[facility] at [facility] ED from 05/24/2022 in [facility] Emergency Department at [facility]  C-SSRS RISK CATEGORY No Risk No Risk    Have you Recently Had Thoughts About Hurting Someone Sherral? No  Are You Planning to Harm Someone at This Time? No  Explanation: n/a   Have You Used Any Alcohol or Drugs in the Past 24 Hours? No  How Long Ago Did You Use Drugs or Alcohol? N/a What Did You Use and How Much? N/a  Do You Currently Have a Therapist/Psychiatrist? Yes  Name of Therapist/Psychiatrist: Name of Therapist/Psychiatrist: yes   Have You Been Recently Discharged From Any Office Practice or Programs? Yes  Explanation of Discharge From Practice/Program: [facility] yesterday     CCA Screening Triage Referral Assessment Type of Contact: Face-to-Face  Telemedicine Service Delivery:  n/a Is this Initial or Reassessment?  N/a Date Telepsych consult ordered in [facility]:   N/a Time Telepsych consult ordered in [facility]:   N/a Location of Assessment: [facility]  Provider Location: [facility] Assessment Services   Collateral Involvement: Social Research Officer, Government, Information Systems Manager   Does Patient Have a Automotive Engineer Guardian? Yes Valerie [name]  Legal Guardian Contact Information: Valerie [name]  Copy of Legal Guardianship Form: -- (n/a)  Legal Guardian Notified of Arrival: -- (n/a)  Legal Guardian Notified of Pending Discharge: -- (n/a)  If Minor and Not Living with Parent(s), Who has Custody? n/a  Is CPS involved or ever been involved? Never  Is APS involved or ever been involved? Never   Patient  Determined To Be At Risk for Harm To Self or Others Based on Review of Patient Reported Information or Presenting Complaint? No  Method: No Plan  Availability of Means: No access or NA  Intent: Vague intent or NA  Notification Required: No need or identified person  Additional Information for Danger to Others Potential: -- (n/a)  Additional Comments for Danger to Others Potential: n/a  Are There Guns or Other Weapons in Your Home? No  Types of Guns/Weapons: n/a  Are These Weapons Safely Secured?                            -- (n/a)  Who Could Verify You Are Able To Have These Secured: n/a  Do You Have any Outstanding Charges, Pending Court Dates, Parole/Probation? none reported  Contacted To Inform of Risk of Harm To Self or Others: Family/Significant Other:    Does Patient Present under Involuntary Commitment? No    [place] of Residence: [place]   Patient Currently Receiving the Following Services: Medication Management; Skilled Nursing Facility   Determination of Need: Urgent (48 hours)   Options For Referral: Inpatient Hospitalization; Medication Management; Outpatient Therapy; Geropsychiatric Facility     CCA Biopsychosocial Patient Reported Schizophrenia/Schizoaffective Diagnosis in Past: Yes   Strengths: self-awareness   Mental Health Symptoms Depression:  None   Duration of Depressive symptoms:    Mania:  None   Anxiety:  Worrying; Tension; Sleep; Restlessness   Psychosis:  Delusions   Duration of Psychotic symptoms: Duration of Psychotic Symptoms: Less than six months   Trauma:  None   Obsessions:  None   Compulsions:  None   Inattention:  None   Hyperactivity/Impulsivity:  None   Oppositional/Defiant Behaviors:  None   Emotional Irregularity:  None   Other Mood/Personality Symptoms:  n/a    Mental Status Exam Appearance and self-care  Stature:  Average   Weight:  Average weight   Clothing:  Neat/clean   Grooming:   Normal   Cosmetic use:  None   Posture/gait:  Normal   Motor activity:  Not Remarkable   Sensorium  Attention:  Normal   Concentration:  Normal   Orientation:  X5   Recall/memory:  poor  Affect and Mood  Affect:  Appropriate   Mood:  Anxious   Relating  Eye contact:  Normal   Facial expression:  Anxious   Attitude toward examiner:  Cooperative   Thought and Language  Speech flow: Normal   Thought content:  Appropriate to Mood and Circumstances   Preoccupation:  None   Hallucinations:  None   Organization:  Coherent   [employer] of Knowledge:  Average   Intelligence:  Average   Abstraction:  Normal   Judgement:  Impaired   Reality Testing:  Distorted   Insight:  Lacking   Decision Making:  Impulsive   Social Functioning  Social Maturity:  Impulsive   Social Judgement:  Naive   Stress  Stressors:  Other (Comment)   Coping Ability:  Overwhelmed   Skill Deficits:  Decision making; Self-control; Communication   Supports:  Support needed; Family     Religion: Religion/Spirituality Are You A Religious Person?: Yes How Might This Affect Treatment?: none  Leisure/Recreation: Leisure / Recreation Do You Have Hobbies?: Yes Leisure and Hobbies: associate professor and singing  Exercise/Diet: Exercise/Diet Do You Exercise?: No Have You Gained or Lost A Significant Amount of Weight in the Past Six Months?: No Do You Follow a Special Diet?: No Do You Have Any Trouble Sleeping?: Yes Explanation of Sleeping Difficulties: 3.5-4 hours sleep nightly   CCA Employment/Education Employment/Work Situation: Employment / Work Academic Librarian Situation: Retired Passenger Transport Manager has Been Impacted by Current Illness: No Has Patient ever Been in Equities Trader?: No  Education: Education Is Patient Currently Attending School?: No Last Grade Completed: 12 Did You Product Manager?: No Did You Have An Individualized Education Program (IIEP):  No Did You Have Any Difficulty At [employer]?: No Patient's Education Has Been Impacted by Current Illness: No   CCA Family/Childhood History Family and Relationship History: Family history Marital status: Single Does patient have children?: Yes How many children?: 1 How is patient's relationship with their children?: good  Childhood History:  Childhood History By whom was/is the patient raised?: Valerie [name] Did patient suffer any verbal/emotional/physical/sexual abuse as a child?: No Did patient suffer from severe childhood neglect?: No Has patient ever been sexually abused/assaulted/raped as an adolescent or adult?: No Was the patient ever a victim of a crime or a disaster?: No Has patient been affected by domestic violence as an adult?: No       CCA Substance Use Alcohol/Drug Use: Alcohol / Drug Use Pain Medications: see MAR Prescriptions: see MAR Over the Counter: see MAR History of alcohol / drug use?: No history of alcohol / drug abuse Longest period of sobriety (when/how long): n/a Negative Consequences of Use:  (n/a) Withdrawal Symptoms:  (  n/a)     ASAM's:  Six Dimensions of Multidimensional Assessment  Dimension 1:  Acute Intoxication and/or Withdrawal Potential:   Dimension 1:  Description of individual's past and current experiences of substance use and withdrawal: n/a  Dimension 2:  Biomedical Conditions and Complications:   Dimension 2:  Description of patient's biomedical conditions and  complications: n/a  Dimension 3:  Emotional, Behavioral, or Cognitive Conditions and Complications:  Dimension 3:  Description of emotional, behavioral, or cognitive conditions and complications: n/a  Dimension 4:  Readiness to Change:  Dimension 4:  Description of Readiness to Change criteria: n/a  Dimension 5:  Relapse, Continued use, or Continued Problem Potential:  Dimension 5:  Relapse, continued use, or continued problem potential critiera description: n/a  Dimension 6:   Recovery/Living Environment:  Dimension 6:  Recovery/Iiving environment criteria description: n/a  ASAM Severity Score:    ASAM Recommended Level of Treatment: ASAM Recommended Level of Treatment:  (n/a)   Substance use Disorder (SUD) Substance Use Disorder (SUD)  Checklist Symptoms of Substance Use:  (n/a)  Recommendations for Services/Supports/Treatments: Recommendations for Services/Supports/Treatments Recommendations For Services/Supports/Treatments: Inpatient Hospitalization, Medication Management  Disposition Recommendation per psychiatric provider:  Recommends inpatient psychiatric treatment, geropsychiatry.    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, L4-5, L5-S1) 02/02/2023   Chronic low back pain (1ry area of Pain) (Bilateral) (R>L) w/ sciatica (Right) 12/21/2022   Chronic lower extremity pain (2ry area of Pain) (Right) 12/21/2022   DDD (degenerative disc disease), lumbosacral 12/21/2022   Abnormal MRI, lumbar spine (07/31/2022) 12/21/2022   Grade 1 Retrolisthesis of L2/L3 & L3/L4 (3mm) 12/21/2022   Grade 1 Anterolisthesis of lumbosacral spine (L5/S1) (3mm) 12/21/2022   Chronic pain syndrome 12/20/2022   Pharmacologic therapy 12/20/2022   Disorder of skeletal system 12/20/2022   Problems influencing health status 12/20/2022   IBS (irritable bowel syndrome) 04/25/2019   Diarrhea 01/18/2019   Essential hypertension 03/17/2005   Pure hypercholesterolemia 03/17/2005   Schizophrenia (HCC) 03/17/2005     Referrals to  Alternative Service(s): Referred to Alternative Service(s):   Place:   Date:   Time:    Referred to Alternative Service(s):   Place:   Date:   Time:    Referred to Alternative Service(s):   Place:   Date:   Time:    Referred to Alternative Service(s):   Place:   Date:   Time:     [place] JONETTA Childes, [facility]

## 2024-06-19 NOTE — Progress Notes (Signed)
 Patient denies SI, HI, and AVH. Patient appears suspicious of staff and peers. Patient made comment regarding another peer, stating, "Why is she here? She is not famous; she should not have been allowed in here." Patient observed staring intensely at the peer while in the dayroom. During discussion about her care, patient requested that the nurse whisper so "no one hears," despite no individuals being nearby. Patient refused three scheduled medications today. MD notified. Please see MAR for details. Patient remained calm and cooperative during interaction. Safety maintained.  06/19/24 0900  Psych Admission Type (Psych Patients Only)  Admission Status Voluntary  Psychosocial Assessment  Patient Complaints Worrying  Eye Contact Intense  Facial Expression Animated  Affect Preoccupied  Speech Logical/coherent  Interaction Assertive;Guarded  Motor Activity Slow  Appearance/Hygiene In scrubs  Behavior Characteristics Cooperative  Mood Suspicious;Pleasant  Thought Process  Coherency WDL  Content Paranoia  Delusions Grandeur (states I am a famous for all I have done for humanity I help people succeed in life.)  Perception WDL  Hallucination None reported or observed (states had VH at facility but they were induced by other patients adn would see floaters and flashes)  Judgment WDL  Confusion WDL  Danger to Self  Current suicidal ideation? Denies

## 2024-06-19 NOTE — Progress Notes (Signed)
 Pt refused Haldol  reports she was O/D on the med at the facility she come from. Refused Clozapine  reports does not take and does not need. Requested naproxen , flomax and diclofenac  be ordered. States she takes at home. Notified MD Jadaoalle

## 2024-06-19 NOTE — Plan of Care (Signed)

## 2024-06-19 NOTE — Progress Notes (Signed)
   06/19/24 1329  Vital Signs  Pulse Rate 69  BP (!) 146/75   Pt come to nursing station and reports can you take my vitals I feel sick, my heart feels sick they left off my propanolol yesterday. RN assisted pt to room and took V/S after pt sees V/S she reports oh I feel better I just needed to make sure my HR was ok pt received scheduled propanolol this morning and is ordered BID. RN will continue to monitor and told PT let RN know if she feels this way again.

## 2024-06-19 NOTE — Progress Notes (Addendum)
 Per report POA is mother Hargis [phone]  Son Denece [phone]

## 2024-06-20 DIAGNOSIS — F250 Schizoaffective disorder, bipolar type: Secondary | ICD-10-CM | POA: Insufficient documentation

## 2024-06-20 MED ORDER — FAMOTIDINE 20 MG PO TABS
20.0000 mg | ORAL_TABLET | Freq: Every day | ORAL | Status: DC
  Administered 2024-06-20 – 2024-07-08 (×19): 20 mg via ORAL
  Filled 2024-06-20 (×19): qty 1

## 2024-06-20 MED ORDER — DICLOFENAC SODIUM 1 % EX GEL
4.0000 g | Freq: Four times a day (QID) | CUTANEOUS | Status: DC
  Administered 2024-06-20 – 2024-07-12 (×55): 4 g via TOPICAL
  Filled 2024-06-20 (×4): qty 100

## 2024-06-20 MED ORDER — ACETAMINOPHEN 325 MG PO TABS
650.0000 mg | ORAL_TABLET | Freq: Four times a day (QID) | ORAL | Status: DC | PRN
  Administered 2024-06-20 – 2024-06-21 (×2): 650 mg via ORAL
  Filled 2024-06-20 (×2): qty 2

## 2024-06-20 MED ORDER — VITAMIN D 25 MCG (1000 UNIT) PO TABS
1000.0000 [IU] | ORAL_TABLET | ORAL | Status: AC
  Administered 2024-06-20 – 2024-08-15 (×8): 1000 [IU] via ORAL
  Filled 2024-06-20 (×9): qty 1

## 2024-06-20 MED ORDER — HYDROCHLOROTHIAZIDE 25 MG PO TABS
25.0000 mg | ORAL_TABLET | Freq: Every day | ORAL | Status: DC
  Administered 2024-06-20 – 2024-07-08 (×18): 25 mg via ORAL
  Filled 2024-06-20 (×20): qty 1

## 2024-06-20 MED ORDER — BISACODYL 5 MG PO TBEC: 10.0000 mg | DELAYED_RELEASE_TABLET | Freq: Every day | ORAL | Status: DC | PRN

## 2024-06-20 MED ORDER — NAPROXEN 500 MG PO TABS
500.0000 mg | ORAL_TABLET | Freq: Two times a day (BID) | ORAL | Status: DC | PRN
  Administered 2024-06-20 – 2024-06-22 (×3): 500 mg via ORAL
  Filled 2024-06-20 (×4): qty 1

## 2024-06-20 NOTE — Progress Notes (Signed)
 [facility] MD Progress Note  06/20/2024 10:25 PM Valerie [name]  MRN:  [id number]   Valerie [name] is a 63 year old female presenting voluntary to [facility] requesting a psychiatric evaluation for worsening psychosis. Patient has history of schizophrenia. Patient denied SI, HI, psychosis and alcohol/drug usage. Patient resides at [facility]. Patient reports people are coming by my door talking loud, yelling and screaming and coming in my room with keys. Patient reports having multiple medical Afibs and blood clots due to this. Patient reports people are doing things in the dining room. When asked if she called EMS, patient stated I called EMS, I stripped searched over the phone and called the authorities. Patient is admitted to [facility] unit with Q15 min safety monitoring. Multidisciplinary team approach is offered. Medication management; group/milieu therapy is offered.  Subjective:  Chart reviewed, case discussed in multidisciplinary meeting, patient seen during rounds.   Patient is noted to be resting in her room.  She remains delusional and manic.  She wrote a list of medications for her arthritic pain and reports that she needs to take them.  She lacks insight into her mental health problems and continues to refuse to discuss her medications and the need for psychotropic medications.  She denies SI/HI/plan and denies hallucinations.  Per nursing patient is refusing her psychiatric medications.  Per social work team patient's legal guardian is her mom from another state and the group home will be sending out the paperwork about the guardianship.  Will discuss the plan to initiate nonemergent forced medication.  Past Psychiatric History: see h&P Family History:  Family History  Problem Relation Age of Onset   Colon cancer Neg Hx    Celiac disease Neg Hx    Inflammatory bowel disease Neg Hx    Social History:  Social History   Substance and Sexual Activity  Alcohol  Use Never     Social History   Substance and Sexual Activity  Drug Use Never    Social History   Socioeconomic History   Marital status: Single    Spouse name: Not on file   Number of children: Not on file   Years of education: Not on file   Highest education level: Not on file  Occupational History   Not on file  Tobacco Use   Smoking status: Never   Smokeless tobacco: Never  Substance and Sexual Activity   Alcohol use: Never   Drug use: Never   Sexual activity: Not Currently    Birth control/protection: Pill  Other Topics Concern   Not on file  Social History Narrative   Not on file   Social Drivers of Health   Financial Resource Strain: Not on file  Food Insecurity: Unknown (06/19/2024)   Hunger Vital Sign    Worried About Running Out of Food in the Last Year: Never true    Ran Out of Food in the Last Year: Patient declined  Transportation Needs: No Transportation Needs (06/19/2024)   PRAPARE - Administrator, Civil Service (Medical): No    Lack of Transportation (Non-Medical): No  Physical Activity: Not on file  Stress: Not on file  Social Connections: Not on file   Past Medical History:  Past Medical History:  Diagnosis Date   Chronic lower extremity pain (2ry area of Pain) (Right) 12/21/2022   Chronic pain syndrome 12/20/2022   DDD (degenerative disc disease), lumbosacral 12/21/2022   HTN (hypertension)    Hypercholesteremia    Prediabetes  Schizophrenia [facility])     Past Surgical History:  Procedure Laterality Date   COLONOSCOPY  2014   Dr. Tobie: Pancolonic diverticulosis, tortuous colon, next colonoscopy 2024   TONSILLECTOMY      Current Medications: Current Facility-Administered Medications  Medication Dose Route Frequency Provider Last Rate Last Admin   acetaminophen  (TYLENOL ) tablet 650 mg  650 mg Oral Q6H PRN Ira Busbin, MD   650 mg at 06/20/24 2103   alum & mag hydroxide-simeth (MAALOX/MYLANTA) 200-200-20 MG/5ML suspension  30 mL  30 mL Oral Q4H PRN Bobbitt, Shalon E, NP       aspirin  EC tablet 81 mg  81 mg Oral Daily Bobbitt, Shalon E, NP   81 mg at 06/20/24 0954   bisacodyl  (DULCOLAX) EC tablet 10 mg  10 mg Oral Daily PRN Leib Elahi, MD       cholecalciferol  (VITAMIN D3) 25 MCG (1000 UNIT) tablet 1,000 Units  1,000 Units Oral Weekly Isao Seltzer, MD   1,000 Units at 06/20/24 1232   cloZAPine  (CLOZARIL ) tablet 50 mg  50 mg Oral BID Bobbitt, Shalon E, NP       diclofenac  Sodium (VOLTAREN ) 1 % topical gel 4 g  4 g Topical QID Naftuli Dalsanto, MD   4 g at 06/20/24 2127   DULoxetine  (CYMBALTA ) DR capsule 20 mg  20 mg Oral Daily Bobbitt, Shalon E, NP   20 mg at 06/20/24 0957   famotidine  (PEPCID ) tablet 20 mg  20 mg Oral Daily Ashten Prats, MD   20 mg at 06/20/24 1232   fluticasone  (FLONASE ) 50 MCG/ACT nasal spray 1 spray  1 spray Each Nare Daily Bobbitt, Shalon E, NP       gabapentin  (NEURONTIN ) capsule 300 mg  300 mg Oral TID Bobbitt, Shalon E, NP   300 mg at 06/20/24 2101   haloperidol  (HALDOL ) tablet 0.5 mg  0.5 mg Oral BID Bobbitt, Shalon E, NP       hydrochlorothiazide  (HYDRODIURIL ) tablet 25 mg  25 mg Oral Daily Adonai Selsor, MD   25 mg at 06/20/24 1232   hydrOXYzine  (ATARAX ) tablet 25 mg  25 mg Oral TID PRN Bobbitt, Shalon E, NP       linagliptin  (TRADJENTA ) tablet 5 mg  5 mg Oral Daily Bobbitt, Shalon E, NP   5 mg at 06/20/24 0956   lisinopril  (ZESTRIL ) tablet 30 mg  30 mg Oral Daily Bobbitt, Shalon E, NP   30 mg at 06/20/24 9044   magnesium  hydroxide (MILK OF MAGNESIA) suspension 30 mL  30 mL Oral Daily PRN Bobbitt, Shalon E, NP       metFORMIN  (GLUCOPHAGE ) tablet 1,000 mg  1,000 mg Oral Q supper Bobbitt, Shalon E, NP   1,000 mg at 06/20/24 1716   multivitamin with minerals tablet 1 tablet  1 tablet Oral Daily Bobbitt, Shalon E, NP   1 tablet at 06/20/24 0957   naproxen  (NAPROSYN ) tablet 500 mg  500 mg Oral Q12H PRN Pragya Lofaso, MD   500 mg at 06/20/24 2201   OLANZapine  (ZYPREXA ) injection 5  mg  5 mg Intramuscular TID PRN Bobbitt, Shalon E, NP       OLANZapine  zydis (ZYPREXA ) disintegrating tablet 5 mg  5 mg Oral TID PRN Bobbitt, Shalon E, NP       ondansetron  (ZOFRAN ) tablet 4 mg  4 mg Oral Q8H PRN Bobbitt, Shalon E, NP       potassium chloride  SA (KLOR-CON  M) CR tablet 20 mEq  20 mEq Oral BID Bobbitt, Shalon  E, NP   20 mEq at 06/20/24 2101   propranolol  (INDERAL ) tablet 40 mg  40 mg Oral BID Bobbitt, Shalon E, NP   40 mg at 06/20/24 2102   rosuvastatin  (CRESTOR ) tablet 5 mg  5 mg Oral Daily Bobbitt, Shalon E, NP   5 mg at 06/20/24 9048   traZODone  (DESYREL ) tablet 50 mg  50 mg Oral QHS PRN Bobbitt, Shalon E, NP        Lab Results:  Results for orders placed or performed during the hospital encounter of 06/18/24 (from the past 48 hours)  Resp panel by RT-PCR (RSV, Flu A&B, Covid) Anterior Nasal Swab     Status: None   Collection Time: 06/19/24  3:45 AM   Specimen: Anterior Nasal Swab  Result Value Ref Range   SARS Coronavirus 2 by RT PCR NEGATIVE NEGATIVE    Comment: (NOTE) SARS-CoV-2 target nucleic acids are NOT DETECTED.  The SARS-CoV-2 RNA is generally detectable in upper respiratory specimens during the acute phase of infection. The lowest concentration of SARS-CoV-2 viral copies this assay can detect is 138 copies/mL. A negative result does not preclude SARS-Cov-2 infection and should not be used as the sole basis for treatment or other patient management decisions. A negative result may occur with  improper specimen collection/handling, submission of specimen other than nasopharyngeal swab, presence of viral mutation(s) within the areas targeted by this assay, and inadequate number of viral copies(<138 copies/mL). A negative result must be combined with clinical observations, patient history, and epidemiological information. The expected result is Negative.  Fact Sheet for Patients:  bloggercourse.com  Fact Sheet for Healthcare Providers:   seriousbroker.it  This test is no t yet approved or cleared by the [place]  FDA and  has been authorized for detection and/or diagnosis of SARS-CoV-2 by FDA under an Emergency Use Authorization (EUA). This EUA will remain  in effect (meaning this test can be used) for the duration of the COVID-19 declaration under Section 564(b)(1) of the Act, 21 U.S.C.section 360bbb-3(b)(1), unless the authorization is terminated  or revoked sooner.       Influenza A by PCR NEGATIVE NEGATIVE   Influenza B by PCR NEGATIVE NEGATIVE    Comment: (NOTE) The Xpert Xpress SARS-CoV-2/FLU/RSV plus assay is intended as an aid in the diagnosis of influenza from Nasopharyngeal swab specimens and should not be used as a sole basis for treatment. Nasal washings and aspirates are unacceptable for Xpert Xpress SARS-CoV-2/FLU/RSV testing.  Fact Sheet for Patients: bloggercourse.com  Fact Sheet for Healthcare Providers: seriousbroker.it  This test is not yet approved or cleared by the [place]  FDA and has been authorized for detection and/or diagnosis of SARS-CoV-2 by FDA under an Emergency Use Authorization (EUA). This EUA will remain in effect (meaning this test can be used) for the duration of the COVID-19 declaration under Section 564(b)(1) of the Act, 21 U.S.C. section 360bbb-3(b)(1), unless the authorization is terminated or revoked.     Resp Syncytial Virus by PCR NEGATIVE NEGATIVE    Comment: (NOTE) Fact Sheet for Patients: bloggercourse.com  Fact Sheet for Healthcare Providers: seriousbroker.it  This test is not yet approved or cleared by the [place]  FDA and has been authorized for detection and/or diagnosis of SARS-CoV-2 by FDA under an Emergency Use Authorization (EUA). This EUA will remain in effect (meaning this test can be used) for the duration of  the COVID-19 declaration under Section 564(b)(1) of the Act, 21 U.S.C. section 360bbb-3(b)(1), unless the authorization is terminated or revoked.  Performed at [facility], [address], [place], [place] [address]     Blood Alcohol level:  Lab Results  Component Value Date   [facility] <15 06/18/2024    Metabolic Disorder Labs: No results found for: HGBA1C, MPG No results found for: PROLACTIN No results found for: CHOL, TRIG, HDL, CHOLHDL, VLDL, LDLCALC  Physical Findings: AIMS:  , ,  ,  ,    CIWA:    COWS:      Psychiatric Specialty Exam:  Presentation  General Appearance:  Casual  Eye Contact: Fleeting  Speech: Normal Rate  Speech Volume: Increased    Mood and Affect  Mood: Euphoric  Affect: Labile   Thought Process  Thought Processes: Disorganized  Orientation:Full (Time, Place and Person)  Thought Content:Illogical; Delusions; Tangential  Hallucinations:Hallucinations: None  Ideas of Reference:Delusions  Suicidal Thoughts:Suicidal Thoughts: No  Homicidal Thoughts:Homicidal Thoughts: No   Sensorium  Memory: Immediate Fair; Remote Fair  Judgment: Impaired  Insight: Shallow   Executive Functions  Concentration: Poor  Attention Span: Poor  Recall: [employer] of Knowledge: Fair  Language: Fair   Psychomotor Activity  Psychomotor Activity: Psychomotor Activity: Normal  Musculoskeletal: Strength & Muscle Tone: within normal limits Gait & Station: normal Assets  Assets: Manufacturing Systems Engineer; Desire for Improvement    Physical Exam: Physical Exam ROS Blood pressure (!) 159/100, pulse 89, temperature (!) 97.5 F (36.4 C), resp. rate 18, height 5' 5 (1.651 m), weight 81 kg, SpO2 100%. Body mass index is 29.7 kg/m.  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  She needs inpatient hospitalization for medication management and close monitoring.  Treatment Plan Summary: Patient has legal guardian  Safety and Monitoring:             -- Voluntary admission to inpatient psychiatric unit for safety, stabilization and treatment             -- Daily contact with patient to assess and evaluate symptoms and progress in treatment             -- Patient's case to be discussed in multi-disciplinary team meeting             -- Observation Level: q15 minute checks             -- Vital signs:  q12 hours             -- Precautions: suicide, elopement, and assault   2. Psychiatric Diagnoses and Treatment:               Clozaril  50 mg twice daily Cymbalta  20 mg daily   -- The risks/benefits/side-effects/alternatives to this medication were discussed in detail with the patient and time was given for questions. The patient consents to medication trial.                -- Metabolic profile and EKG monitoring obtained while on an atypical antipsychotic (BMI: Lipid Panel: HbgA1c: QTc:)              -- Encouraged patient to participate in unit milieu and in scheduled group therapies                            3. Medical Issues Being Addressed:   4. Discharge Planning:   -- Social work and case management to assist with discharge planning and  identification of hospital follow-up needs prior to discharge  -- Estimated LOS: 3-4 days  Aditi Rovira, MD 06/20/2024, 10:25 PM

## 2024-06-20 NOTE — Plan of Care (Signed)
  Problem: Education: Goal: Emotional status will improve Outcome: Not Progressing   Problem: Coping: Goal: Ability to verbalize frustrations and anger appropriately will improve Outcome: Not Progressing

## 2024-06-20 NOTE — [facility] Counselor (Signed)
 CS[facility] contacted [name], pt's placement per pt's request for clothing.   CSW spoke to [place] at [facility], Charlies reports pt's mother is her legal guardian.   CSW inquired about someone bringing pt's clothing and hat.   Renee reports she will contact CSW back if someone is able to come out and bring pt her clothing  CSW contacted [facility] to confirm guardianship. CSW reached VM stating that due to inclement weather the courthouse is closed until 12:00 PM.   CSW contacted [facility] as pt's address is listed as [place]. CSW transferred to [place] , CSW left VM with [place] requesting return call.   Lum Croft, MSW, [place] 06/20/2024 11:26 AM

## 2024-06-20 NOTE — Group Note (Signed)
 Date:  06/20/2024 Time:  4:25 PM  Group Topic/Focus:  Overcoming Stress:   The focus of this group is to define stress and help patients assess their triggers.    Participation Level:  Did Not Attend   Valerie [name] 06/20/2024, 4:25 PM

## 2024-06-20 NOTE — Progress Notes (Signed)
   06/20/24 1800  Psych Admission Type (Psych Patients Only)  Admission Status Voluntary  Psychosocial Assessment  Patient Complaints Confusion;Irritability  Eye Contact Glaring  Facial Expression Flat  Affect Preoccupied  Speech Argumentative  Interaction Assertive;Guarded  Motor Activity Slow  Appearance/Hygiene Unremarkable  Behavior Characteristics Resistant to care  Mood Preoccupied  Thought Process  Coherency WDL  Content Paranoia  Delusions Grandeur  Perception Derealization  Hallucination None reported or observed  Judgment Impaired  Confusion Mild  Danger to Self  Current suicidal ideation? Denies  Danger to Others  Danger to Others None reported or observed

## 2024-06-20 NOTE — BHH Counselor (Signed)
 CSW contacted [facility] [employer] to verify guardianship [phone]).   CSW spoke to special proceedings who reports they have not found any guardianship documentation on pt, but reports they are going to double check with a supervisor and call CSW back.   CSW awaits return call at this time.   Lum Croft, MSW, [place] 06/20/2024 4:06 PM

## 2024-06-20 NOTE — Group Note (Signed)
 B[facility] LCSW Group Therapy Note   Group Date: 06/20/2024 Start Time: 1330 End Time: 1400   Type of Therapy/Topic:  Group Therapy:  Emotion Regulation  Participation Level:  Minimal   Mood:  Description of Group:    The purpose of this group is to assist patients in learning to regulate negative emotions and experience positive emotions. Patients will be guided to discuss ways in which they have been vulnerable to their negative emotions. These vulnerabilities will be juxtaposed with experiences of positive emotions or situations, and patients challenged to use positive emotions to combat negative ones. Special emphasis will be placed on coping with negative emotions in conflict situations, and patients will process healthy conflict resolution skills.  Therapeutic Goals: Patient will identify two positive emotions or experiences to reflect on in order to balance out negative emotions:  Patient will label two or more emotions that they find the most difficult to experience:  Patient will be able to demonstrate positive conflict resolution skills through discussion or role plays:   Summary of Patient Progress:   Pt came in and disrupted group multiple times. Pt was asked to leave group.     Therapeutic Modalities:   Cognitive Behavioral Therapy Feelings Identification Dialectical Behavioral Therapy   Valerie [name], LCSWA

## 2024-06-20 NOTE — Group Note (Signed)
 Date:  06/20/2024 Time:  9:23 PM  Group Topic/Focus:  Wrap-Up Group:   The focus of this group is to help patients review their daily goal of treatment and discuss progress on daily workbooks.    Participation Level:  Active  Participation Quality:  Appropriate  Affect:  Appropriate  Cognitive:  Alert  Insight: Improving  Engagement in Group:  Improving  Modes of Intervention:  Discussion  Additional Comments:    Valerie [name] 06/20/2024, 9:23 PM

## 2024-06-20 NOTE — Group Note (Signed)
 Recreation Therapy Group Note   Group Topic:Communication  Group Date: 06/20/2024 Start Time: 1415 End Time: 1500 Facilitators: Celestia Jeoffrey BRAVO, LRT, CTRS Location: Dayroom  Group Description: Name That Food. Pt will randomly select 1 cut paper from laminated stack of popular food images from the 1970's-1990's from LRT. Without showing the group the image they selected, pt will use descriptive words to explain what food they selected. Once the image is correctly guessed, LRT and pts will reminisce and discuss past fond memories associated with the food as well as the importance of communication.  Goal Area(s) Addressed: Patient will increase communication skills.  Patient will increase frustration tolerance skills. Patient will reminisce a fond memory in their life.     Affect/Mood: N/A   Participation Level: Did not attend    Clinical Observations/Individualized Feedback: Patient did not attend group.   Plan: Continue to engage patient in RT group sessions 2-3x/week.   Jeoffrey BRAVO Celestia, LRT, CTRS 06/20/2024 4:31 PM

## 2024-06-20 NOTE — Group Note (Signed)
 Date:  06/20/2024 Time:  11:04 AM  Group Topic/Focus:  Building Self Esteem:   The Focus of this group is helping patients become aware of the effects of self-esteem on their lives, the things they and others do that enhance or undermine their self-esteem, seeing the relationship between their level of self-esteem and the choices they make and learning ways to enhance self-esteem.    Participation Level:  Did Not Attend   Valerie [name] [place] 06/20/2024, 11:04 AM

## 2024-06-21 DIAGNOSIS — F250 Schizoaffective disorder, bipolar type: Secondary | ICD-10-CM | POA: Diagnosis not present

## 2024-06-21 LAB — CLOZAPINE (CLOZARIL)
Clozapine Lvl: 21 ng/mL — ABNORMAL LOW (ref 350–600)
NorClozapine: <20 ng/mL
Total(Cloz+Norcloz): <41 ng/mL

## 2024-06-21 MED ORDER — AMLODIPINE BESYLATE 5 MG PO TABS
5.0000 mg | ORAL_TABLET | Freq: Every day | ORAL | Status: DC
  Administered 2024-06-21 – 2024-07-03 (×12): 5 mg via ORAL
  Filled 2024-06-21 (×13): qty 1

## 2024-06-21 NOTE — Progress Notes (Signed)
°   06/21/24 2200  Psych Admission Type (Psych Patients Only)  Admission Status Voluntary  Psychosocial Assessment  Patient Complaints Worrying  Eye Contact Glaring  Facial Expression Animated  Affect Preoccupied  Speech Logical/coherent  Interaction Assertive;Guarded  Motor Activity Slow  Appearance/Hygiene Unremarkable  Behavior Characteristics Resistant to care  Mood Preoccupied  Thought Process  Coherency WDL  Content Paranoia  Delusions Somatic  Perception Hallucinations  Hallucination Auditory  Judgment Impaired  Confusion Mild  Danger to Self  Current suicidal ideation? Denies  Danger to Others  Danger to Others None reported or observed   Mood/Behavior: Cooperative with staff intermittently irritable. After med administration pt noted to state she is crossing me. Pt asked what she meant by statement. Pt respond Almarie my lesbian cousin. Pt did not elaborate.   Psych assessment: Denies SI/HI and AVH.     Interaction / Group attendance:  Present in the milieu. Minimal interaction with peers and staff.  Did not attend group.   Medication/ PRNs: Refusing some scheduled medications. No prns required.   Pain: Denies   15 min checks in place for safety.

## 2024-06-21 NOTE — Group Note (Signed)
 Date:  06/21/2024 Time:  9:24 PM  Group Topic/Focus:  Self Care:   The focus of this group is to help patients understand the importance of self-care in order to improve or restore emotional, physical, spiritual, interpersonal, and financial health.    Participation Level:  Did Not Attend  Participation Quality:   Did Not Attend  Affect:   Did Not Attend  Cognitive:   Did Not Attend  Insight: None  Engagement in Group:  None  Modes of Intervention:   Did Not Attend  Additional Comments:    Laymon ONEIDA Finder 06/21/2024, 9:24 PM

## 2024-06-21 NOTE — Plan of Care (Signed)
   Problem: Activity: Goal: Interest or engagement in activities will improve Outcome: Progressing Goal: Sleeping patterns will improve Outcome: Progressing

## 2024-06-21 NOTE — Group Note (Signed)
 Date:  06/21/2024 Time:  10:27 AM  Group Topic/Focus:  Morning Stretch with Norleen, Movement therapy    Participation Level:  Active  Participation Quality:  Appropriate  Affect:  Appropriate  Cognitive:  Appropriate  Insight: Appropriate  Engagement in Group:  Engaged  Modes of Intervention:  Activity  Additional Comments:  none  [name] 06/21/2024, 10:27 AM

## 2024-06-21 NOTE — Progress Notes (Signed)
 SI/HI/AVH: denies  Behavior/Mood: resistant to some care/preoccupied   Interaction/Group attendance: assertive/ 2 of 2 groups    Medication/PRNs: compliant with all except Haldol  and Clozaril    Pain: 3/10 bil legs  Other: n/a

## 2024-06-21 NOTE — B[facility] Counselor (Signed)
 CSW contacted [facility] [phone]) to verify guardianship.   CSW spoke with Adams Louder, who reports pt's mother is NOT listed as a legal guardian but is listed as a responsible party.  CSW informed care team that at this time there is NO legal guardian.   CSW will contact [facility] to make a report as provider feels pt lacks capacity.   Lum Croft, MSW, [place] 06/21/2024 3:09 PM

## 2024-06-21 NOTE — BHH Counselor (Signed)
 CSW received call from [facility]. They report there is no guardianship documentation on pt that they can locate at this time.   Lum Croft, MSW, [place] 06/21/2024 11:14 AM

## 2024-06-21 NOTE — Progress Notes (Signed)
 [facility] MD Progress Note  06/21/2024 3:25 PM Valerie [name]  MRN:  [id number]   Valerie [name] is a 63 year old female presenting voluntary to [facility] requesting a psychiatric evaluation for worsening psychosis. Patient has history of schizophrenia. Patient denied SI, HI, psychosis and alcohol/drug usage. Patient resides at [facility]. Patient reports people are coming by my door talking loud, yelling and screaming and coming in my room with keys. Patient reports having multiple medical Afibs and blood clots due to this. Patient reports people are doing things in the dining room. When asked if she called EMS, patient stated I called EMS, I stripped searched over the phone and called the authorities. Patient is admitted to [facility] unit with Q15 min safety monitoring. Multidisciplinary team approach is offered. Medication management; group/milieu therapy is offered.  Subjective:  Chart reviewed, case discussed in multidisciplinary meeting, patient seen during rounds.  Patient is noted to be sitting in her room.  She remains tangential.  She complains of knee pain and insomnia.  Patient continues to refuse her medications and lacks insight into her mental health problems.  She continues to decline taking her medications in the discharge plan of going back to the assisted living.  Social worker is reaching out to APS to confirm if patient had legal guardian and also to reach out to patient's mom or sister.  Patient displays grandiose delusions but is not aggressive.  Patient remains intrusive with poor personal boundaries towards staff and other patients.   Past Psychiatric History: see h&P Family History:  Family History  Problem Relation Age of Onset   Colon cancer Neg Hx    Celiac disease Neg Hx    Inflammatory bowel disease Neg Hx    Social History:  Social History   Substance and Sexual Activity  Alcohol Use Never     Social History   Substance and Sexual  Activity  Drug Use Never    Social History   Socioeconomic History   Marital status: Single    Spouse name: Not on file   Number of children: Not on file   Years of education: Not on file   Highest education level: Not on file  Occupational History   Not on file  Tobacco Use   Smoking status: Never   Smokeless tobacco: Never  Substance and Sexual Activity   Alcohol use: Never   Drug use: Never   Sexual activity: Not Currently    Birth control/protection: Pill  Other Topics Concern   Not on file  Social History Narrative   Not on file   Social Drivers of Health   Financial Resource Strain: Not on file  Food Insecurity: Unknown (06/19/2024)   Hunger Vital Sign    Worried About Running Out of Food in the Last Year: Never true    Ran Out of Food in the Last Year: Patient declined  Transportation Needs: No Transportation Needs (06/19/2024)   PRAPARE - Administrator, Civil Service (Medical): No    Lack of Transportation (Non-Medical): No  Physical Activity: Not on file  Stress: Not on file  Social Connections: Not on file   Past Medical History:  Past Medical History:  Diagnosis Date   Chronic lower extremity pain (2ry area of Pain) (Right) 12/21/2022   Chronic pain syndrome 12/20/2022   DDD (degenerative disc disease), lumbosacral 12/21/2022   HTN (hypertension)    Hypercholesteremia    Prediabetes    Schizophrenia (HCC)  Past Surgical History:  Procedure Laterality Date   COLONOSCOPY  2014   Dr. Tobie: Pancolonic diverticulosis, tortuous colon, next colonoscopy 2024   TONSILLECTOMY      Current Medications: Current Facility-Administered Medications  Medication Dose Route Frequency Provider Last Rate Last Admin   acetaminophen  (TYLENOL ) tablet 650 mg  650 mg Oral Q6H PRN Raylei Losurdo, MD   650 mg at 06/21/24 0303   alum & mag hydroxide-simeth (MAALOX/MYLANTA) 200-200-20 MG/5ML suspension 30 mL  30 mL Oral Q4H PRN Bobbitt, Shalon E, NP        amLODipine  (NORVASC ) tablet 5 mg  5 mg Oral Daily Elaura Calix, MD   5 mg at 06/21/24 1339   aspirin  EC tablet 81 mg  81 mg Oral Daily Bobbitt, Shalon E, NP   81 mg at 06/21/24 1000   bisacodyl  (DULCOLAX) EC tablet 10 mg  10 mg Oral Daily PRN Siah Steely, MD       cholecalciferol  (VITAMIN D3) 25 MCG (1000 UNIT) tablet 1,000 Units  1,000 Units Oral Weekly Leiana Rund, MD   1,000 Units at 06/20/24 1232   cloZAPine  (CLOZARIL ) tablet 50 mg  50 mg Oral BID Bobbitt, Shalon E, NP       diclofenac  Sodium (VOLTAREN ) 1 % topical gel 4 g  4 g Topical QID Donnelly Mellow, MD   4 g at 06/21/24 1334   DULoxetine  (CYMBALTA ) DR capsule 20 mg  20 mg Oral Daily Bobbitt, Shalon E, NP   20 mg at 06/20/24 0957   famotidine  (PEPCID ) tablet 20 mg  20 mg Oral Daily Kadee Philyaw, MD   20 mg at 06/21/24 9040   fluticasone  (FLONASE ) 50 MCG/ACT nasal spray 1 spray  1 spray Each Nare Daily Bobbitt, Shalon E, NP       gabapentin  (NEURONTIN ) capsule 300 mg  300 mg Oral TID Bobbitt, Shalon E, NP   300 mg at 06/21/24 1000   haloperidol  (HALDOL ) tablet 0.5 mg  0.5 mg Oral BID Bobbitt, Shalon E, NP       hydrochlorothiazide  (HYDRODIURIL ) tablet 25 mg  25 mg Oral Daily Ennis Heavner, MD   25 mg at 06/21/24 9040   hydrOXYzine  (ATARAX ) tablet 25 mg  25 mg Oral TID PRN Bobbitt, Shalon E, NP       linagliptin  (TRADJENTA ) tablet 5 mg  5 mg Oral Daily Bobbitt, Shalon E, NP   5 mg at 06/21/24 9040   lisinopril  (ZESTRIL ) tablet 30 mg  30 mg Oral Daily Bobbitt, Shalon E, NP   30 mg at 06/21/24 9040   magnesium  hydroxide (MILK OF MAGNESIA) suspension 30 mL  30 mL Oral Daily PRN Bobbitt, Shalon E, NP       metFORMIN  (GLUCOPHAGE ) tablet 1,000 mg  1,000 mg Oral Q supper Bobbitt, Shalon E, NP   1,000 mg at 06/20/24 1716   multivitamin with minerals tablet 1 tablet  1 tablet Oral Daily Bobbitt, Shalon E, NP   1 tablet at 06/21/24 1000   naproxen  (NAPROSYN ) tablet 500 mg  500 mg Oral Q12H PRN Jessina Marse, MD   500 mg at 06/21/24  1338   OLANZapine  (ZYPREXA ) injection 5 mg  5 mg Intramuscular TID PRN Bobbitt, Shalon E, NP       OLANZapine  zydis (ZYPREXA ) disintegrating tablet 5 mg  5 mg Oral TID PRN Bobbitt, Shalon E, NP       ondansetron  (ZOFRAN ) tablet 4 mg  4 mg Oral Q8H PRN Bobbitt, Shalon E, NP  potassium chloride  SA (KLOR-CON  M) CR tablet 20 mEq  20 mEq Oral BID Bobbitt, Shalon E, NP   20 mEq at 06/21/24 9040   propranolol  (INDERAL ) tablet 40 mg  40 mg Oral BID Bobbitt, Shalon E, NP   40 mg at 06/21/24 9040   rosuvastatin  (CRESTOR ) tablet 5 mg  5 mg Oral Daily Bobbitt, Shalon E, NP   5 mg at 06/21/24 9041   traZODone  (DESYREL ) tablet 50 mg  50 mg Oral QHS PRN Bobbitt, Shalon E, NP        Lab Results:  No results found for this or any previous visit (from the past 48 hours).   Blood Alcohol level:  Lab Results  Component Value Date   [facility] <15 06/18/2024    Metabolic Disorder Labs: No results found for: HGBA1C, MPG No results found for: PROLACTIN No results found for: CHOL, TRIG, HDL, CHOLHDL, VLDL, LDLCALC  Physical Findings: AIMS:  , ,  ,  ,    CIWA:    COWS:      Psychiatric Specialty Exam:  Presentation  General Appearance:  Casual  Eye Contact: Fleeting  Speech: Normal Rate  Speech Volume: Increased    Mood and Affect  Mood: Euphoric  Affect: Labile   Thought Process  Thought Processes: Disorganized  Orientation:Full (Time, Place and Person)  Thought Content:Illogical; Delusions; Tangential  Hallucinations: Denies  Ideas of Reference:Delusions  Suicidal Thoughts: Denies  Homicidal Thoughts: Denies   Sensorium  Memory: Immediate Fair; Remote Fair  Judgment: Impaired  Insight: Shallow   Executive Functions  Concentration: Poor  Attention Span: Poor  Recall: [employer] of Knowledge: Fair  Language: Fair   Psychomotor Activity  Psychomotor Activity: No data recorded  Musculoskeletal: Strength & Muscle Tone:  within normal limits Gait & Station: normal Assets  Assets: Manufacturing Systems Engineer; Desire for Improvement    Physical Exam: Physical Exam ROS Blood pressure (!) 149/82, pulse 81, temperature 97.7 F (36.5 C), resp. rate 16, height 5' 5 (1.651 m), weight 81 kg, SpO2 99%. Body mass index is 29.7 kg/m.  Diagnosis: Active Problems:   Schizoaffective disorder, bipolar type [facility])  Clinical Decision Making: Patient is currently admitted for worsening psychosis, manic symptoms, tangential grandiosity.  She needs inpatient hospitalization for medication management and close monitoring.  Treatment Plan Summary: Patient has legal guardian  Safety and Monitoring:             -- Voluntary admission to inpatient psychiatric unit for safety, stabilization and treatment             -- Daily contact with patient to assess and evaluate symptoms and progress in treatment             -- Patient's case to be discussed in multi-disciplinary team meeting             -- Observation Level: q15 minute checks             -- Vital signs:  q12 hours             -- Precautions: suicide, elopement, and assault   2. Psychiatric Diagnoses and Treatment:               Clozaril  50 mg twice daily Cymbalta  20 mg daily increased to 40 mg   -- The risks/benefits/side-effects/alternatives to this medication were discussed in detail with the patient and time was given for questions. The patient consents to medication trial.                --  Metabolic profile and EKG monitoring obtained while on an atypical antipsychotic (BMI: Lipid Panel: HbgA1c: QTc:)              -- Encouraged patient to participate in unit milieu and in scheduled group therapies                            3. Medical Issues Being Addressed:   4. Discharge Planning:   -- Social work and case management to assist with discharge planning and identification of hospital follow-up needs prior to discharge  -- Estimated LOS: 3-4 days  Allyn Foil, MD 06/21/2024, 3:25 PM

## 2024-06-21 NOTE — Progress Notes (Addendum)
 Estimated Sleeping Duration (Last 24 Hours): 2.50-3.00 hours    06/20/24 2259  Psych Admission Type (Psych Patients Only)  Admission Status Voluntary  Psychosocial Assessment  Patient Complaints Confusion;Irritability  Eye Contact Glaring  Facial Expression Flat  Affect Preoccupied  Speech Argumentative  Interaction Assertive;Guarded  Motor Activity Slow  Appearance/Hygiene Unremarkable  Behavior Characteristics Resistant to care  Mood Preoccupied  Thought Process  Coherency WDL  Content Paranoia  Delusions Grandeur  Perception Depersonalization  Hallucination None reported or observed  Judgment Impaired  Confusion Mild  Danger to Self  Current suicidal ideation? Denies  Danger to Others  Danger to Others None reported or observed

## 2024-06-21 NOTE — Plan of Care (Signed)
   Problem: Education: Goal: Knowledge of [place] [facility] Education information/materials will improve Outcome: Progressing   Problem: Safety: Goal: Periods of time without injury will increase Outcome: Progressing

## 2024-06-22 DIAGNOSIS — F250 Schizoaffective disorder, bipolar type: Secondary | ICD-10-CM | POA: Diagnosis not present

## 2024-06-22 MED ORDER — NAPROXEN 250 MG PO TABS
375.0000 mg | ORAL_TABLET | Freq: Three times a day (TID) | ORAL | Status: DC
  Administered 2024-06-22 – 2024-07-03 (×31): 375 mg via ORAL
  Filled 2024-06-22 (×39): qty 2

## 2024-06-22 MED ORDER — ZOLPIDEM TARTRATE 5 MG PO TABS
5.0000 mg | ORAL_TABLET | Freq: Every day | ORAL | Status: DC
  Administered 2024-06-22 – 2024-06-26 (×5): 5 mg via ORAL
  Filled 2024-06-22 (×7): qty 1

## 2024-06-22 MED ORDER — NAPROXEN 250 MG PO TABS: 250.0000 mg | ORAL_TABLET | Freq: Two times a day (BID) | ORAL | Status: DC

## 2024-06-22 MED ORDER — DULOXETINE HCL 20 MG PO CPEP
40.0000 mg | ORAL_CAPSULE | Freq: Every day | ORAL | Status: DC
  Administered 2024-06-23 – 2024-07-10 (×18): 40 mg via ORAL
  Administered 2024-07-12: 20 mg via ORAL
  Administered 2024-07-13 – 2024-07-26 (×14): 40 mg via ORAL
  Filled 2024-06-22 (×28): qty 2

## 2024-06-22 NOTE — Group Note (Signed)
 LCSW Group Therapy Note  Group Date: 06/22/2024 Start Time: 1330 End Time: 1400   Type of Therapy and Topic:  Group Therapy - Healthy vs Unhealthy Coping Skills  Participation Level:  Did Not Attend   Description of Group The focus of this group was to determine what unhealthy coping techniques typically are used by group members and what healthy coping techniques would be helpful in coping with various problems. Patients were guided in becoming aware of the differences between healthy and unhealthy coping techniques. Patients were asked to identify 2-3 healthy coping skills they would like to learn to use more effectively.  Therapeutic Goals Patients learned that coping is what human beings do all day long to deal with various situations in their lives Patients defined and discussed healthy vs unhealthy coping techniques Patients identified their preferred coping techniques and identified whether these were healthy or unhealthy Patients determined 2-3 healthy coping skills they would like to become more familiar with and use more often. Patients provided support and ideas to each other   Summary of Patient Progress: X   Therapeutic Modalities Cognitive Behavioral Therapy Motivational Interviewing  Lum JONETTA Croft, LCSWA 06/22/2024  2:14 PM

## 2024-06-22 NOTE — Progress Notes (Signed)
 [facility] MD Progress Note  06/22/2024 10:58 PM [name]  MRN:  [id number]   Valerie [name] is a 63 year old female presenting voluntary to [facility] requesting a psychiatric evaluation for worsening psychosis. Patient has history of schizophrenia. Patient denied SI, HI, psychosis and alcohol/drug usage. Patient resides at [facility]. Patient reports people are coming by my door talking loud, yelling and screaming and coming in my room with keys. Patient reports having multiple medical Afibs and blood clots due to this. Patient reports people are doing things in the dining room. When asked if she called EMS, patient stated I called EMS, I stripped searched over the phone and called the authorities. Patient is admitted to [facility] unit with Q15 min safety monitoring. Multidisciplinary team approach is offered. Medication management; group/milieu therapy is offered.  Subjective:  Chart reviewed, case discussed in multidisciplinary meeting, patient seen during rounds.   Patient is noted to be sitting in front of the nurses station.  She remains intrusive with poor personal boundaries.  She informs the provider about having knee pain and insomnia.  She did want some sleep aid but declined trazodone , melatonin.  She agreed to take Ambien to help her sleep.  She continues to display lack of insight into the need of mental health medications.  When asked about Clozaril  she reports that her doctor took it off as she did not need it.  Patient denies SI/HI/plan and denies hallucinations.  She is refusing to go back to the assisted living facility but is not able to discuss other alternate options.  Social work team has reached out to APS who confirmed patient has no legal guardian but has a sister and her mother was her payee.  Team will reach out to her sister.  Past Psychiatric History: see h&P Family History:  Family History  Problem Relation Age of Onset   Colon cancer Neg  Hx    Celiac disease Neg Hx    Inflammatory bowel disease Neg Hx    Social History:  Social History   Substance and Sexual Activity  Alcohol Use Never     Social History   Substance and Sexual Activity  Drug Use Never    Social History   Socioeconomic History   Marital status: Single    Spouse name: Not on file   Number of children: Not on file   Years of education: Not on file   Highest education level: Not on file  Occupational History   Not on file  Tobacco Use   Smoking status: Never   Smokeless tobacco: Never  Substance and Sexual Activity   Alcohol use: Never   Drug use: Never   Sexual activity: Not Currently    Birth control/protection: Pill  Other Topics Concern   Not on file  Social History Narrative   Not on file   Social Drivers of Health   Tobacco Use: Low Risk (06/19/2024)   Patient History    Smoking Tobacco Use: Never    Smokeless Tobacco Use: Never    Passive Exposure: Not on file  Financial Resource Strain: Not on file  Food Insecurity: Unknown (06/19/2024)   Epic    Worried About Programme Researcher, Broadcasting/film/video in the Last Year: Never true    [employer] of Food in the Last Year: Patient declined  Transportation Needs: No Transportation Needs (06/19/2024)   Epic    Lack of Transportation (Medical): No    Lack of Transportation (Non-Medical): No  Physical Activity: Not on file  Stress: Not on file  Social Connections: Not on file  Depression (PHQ2-9): Low Risk (12/21/2022)   Depression (PHQ2-9)    PHQ-2 Score: 0  Alcohol Screen: Low Risk (06/19/2024)   Alcohol Screen    Last Alcohol Screening Score (AUDIT): 0  Housing: Low Risk (06/19/2024)   Epic    Unable to Pay for Housing in the Last Year: No    Number of Times Moved in the Last Year: 0    Homeless in the Last Year: No  Utilities: Not At Risk (06/19/2024)   Epic    Threatened with loss of utilities: No  Health Literacy: Not on file   Past Medical History:  Past Medical History:  Diagnosis Date    Chronic lower extremity pain (2ry area of Pain) (Right) 12/21/2022   Chronic pain syndrome 12/20/2022   DDD (degenerative disc disease), 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 Tobie: Pancolonic diverticulosis, tortuous colon, next colonoscopy 2024   TONSILLECTOMY      Current Medications: Current Facility-Administered Medications  Medication Dose Route Frequency Provider Last Rate Last Admin   acetaminophen  (TYLENOL ) tablet 650 mg  650 mg Oral Q6H PRN Safiatou Islam, MD   650 mg at 06/21/24 0303   alum & mag hydroxide-simeth (MAALOX/MYLANTA) 200-200-20 MG/5ML suspension 30 mL  30 mL Oral Q4H PRN Bobbitt, Shalon E, NP       amLODipine  (NORVASC ) tablet 5 mg  5 mg Oral Daily Izear Pine, MD   5 mg at 06/22/24 9046   aspirin  EC tablet 81 mg  81 mg Oral Daily Bobbitt, Shalon E, NP   81 mg at 06/22/24 9046   bisacodyl  (DULCOLAX) EC tablet 10 mg  10 mg Oral Daily PRN Maurie Olesen, MD       cholecalciferol  (VITAMIN D3) 25 MCG (1000 UNIT) tablet 1,000 Units  1,000 Units Oral Weekly Evelin Cake, MD   1,000 Units at 06/20/24 1232   cloZAPine  (CLOZARIL ) tablet 50 mg  50 mg Oral BID Bobbitt, Shalon E, NP       diclofenac  Sodium (VOLTAREN ) 1 % topical gel 4 g  4 g Topical QID Donnelly Mellow, MD   4 g at 06/22/24 2149   [START ON 06/23/2024] DULoxetine  (CYMBALTA ) DR capsule 40 mg  40 mg Oral Daily Lavine Hargrove, MD       famotidine  (PEPCID ) tablet 20 mg  20 mg Oral Daily Andjela Wickes, MD   20 mg at 06/22/24 9047   fluticasone  (FLONASE ) 50 MCG/ACT nasal spray 1 spray  1 spray Each Nare Daily Bobbitt, Shalon E, NP       gabapentin  (NEURONTIN ) capsule 300 mg  300 mg Oral TID Bobbitt, Shalon E, NP   300 mg at 06/22/24 2149   haloperidol  (HALDOL ) tablet 0.5 mg  0.5 mg Oral BID Bobbitt, Shalon E, NP       hydrochlorothiazide  (HYDRODIURIL )  tablet 25 mg  25 mg Oral Daily Charika Mikelson, MD   25 mg at 06/22/24 9046   hydrOXYzine  (ATARAX ) tablet 25 mg  25 mg Oral TID PRN Bobbitt, Shalon E, NP       linagliptin  (TRADJENTA ) tablet 5 mg  5 mg Oral Daily Bobbitt, Shalon E, NP   5 mg at 06/22/24 0952   lisinopril  (ZESTRIL ) tablet 30 mg  30 mg  Oral Daily Bobbitt, Shalon E, NP   30 mg at 06/22/24 9047   magnesium  hydroxide (MILK OF MAGNESIA) suspension 30 mL  30 mL Oral Daily PRN Bobbitt, Shalon E, NP       metFORMIN  (GLUCOPHAGE ) tablet 1,000 mg  1,000 mg Oral Q supper Bobbitt, Shalon E, NP   1,000 mg at 06/22/24 1720   multivitamin with minerals tablet 1 tablet  1 tablet Oral Daily Bobbitt, Shalon E, NP   1 tablet at 06/22/24 9046   naproxen  (NAPROSYN ) tablet 375 mg  375 mg Oral TID WC Amory Zbikowski, MD   375 mg at 06/22/24 1720   OLANZapine  (ZYPREXA ) injection 5 mg  5 mg Intramuscular TID PRN Bobbitt, Shalon E, NP       OLANZapine  zydis (ZYPREXA ) disintegrating tablet 5 mg  5 mg Oral TID PRN Bobbitt, Shalon E, NP       ondansetron  (ZOFRAN ) tablet 4 mg  4 mg Oral Q8H PRN Bobbitt, Shalon E, NP       potassium chloride  SA (KLOR-CON  M) CR tablet 20 mEq  20 mEq Oral BID Bobbitt, Shalon E, NP   20 mEq at 06/22/24 2149   propranolol  (INDERAL ) tablet 40 mg  40 mg Oral BID Bobbitt, Shalon E, NP   40 mg at 06/22/24 2149   rosuvastatin  (CRESTOR ) tablet 5 mg  5 mg Oral Daily Bobbitt, Shalon E, NP   5 mg at 06/22/24 9046   traZODone  (DESYREL ) tablet 50 mg  50 mg Oral QHS PRN Bobbitt, Shalon E, NP       zolpidem (AMBIEN) tablet 5 mg  5 mg Oral QHS Milo Schreier, MD   5 mg at 06/22/24 2149    Lab Results:  No results found for this or any previous visit (from the past 48 hours).   Blood Alcohol level:  Lab Results  Component Value Date   [facility] <15 06/18/2024    Metabolic Disorder Labs: No results found for: HGBA1C, MPG No results found for: PROLACTIN No results found for: CHOL, TRIG, HDL, CHOLHDL, VLDL, LDLCALC  Physical  Findings: AIMS:  , ,  ,  ,    CIWA:    COWS:      Psychiatric Specialty Exam:  Presentation  General Appearance:  Casual  Eye Contact: Fleeting  Speech: Normal Rate  Speech Volume: Increased    Mood and Affect  Mood: Euphoric  Affect: Labile   Thought Process  Thought Processes: Disorganized  Orientation:Full (Time, Place and Person)  Thought Content:Illogical; Delusions; Tangential  Hallucinations: Denies  Ideas of Reference:Delusions  Suicidal Thoughts: Denies  Homicidal Thoughts: Denies   Sensorium  Memory: Immediate Fair; Remote Fair  Judgment: Impaired  Insight: Shallow   Executive Functions  Concentration: Poor  Attention Span: Poor  Recall: [employer] of Knowledge: Fair  Language: Fair   Psychomotor Activity  Psychomotor Activity: No data recorded  Musculoskeletal: Strength & Muscle Tone: within normal limits Gait & Station: normal Assets  Assets: Manufacturing Systems Engineer; Desire for Improvement    Physical Exam: Physical Exam ROS Blood pressure (!) 156/107, pulse 94, temperature 98.1 F (36.7 C), resp. rate 19, height 5' 5 (1.651 m), weight 81 kg, SpO2 100%. Body mass index is 29.7 kg/m.  Diagnosis: Active Problems:   Schizoaffective disorder, bipolar type [facility])  Clinical Decision Making: Patient is currently admitted for worsening psychosis, manic symptoms, tangential grandiosity.  She needs inpatient hospitalization for medication management and close monitoring.  Treatment Plan Summary:  Safety and Monitoring: If patient meets  criteria for nonemergent forced medication we will need to change her status to involuntary commitment             -- Voluntary admission to inpatient psychiatric unit for safety, stabilization and treatment             -- Daily contact with patient to assess and evaluate symptoms and progress in treatment             -- Patient's case to be discussed in multi-disciplinary team  meeting             -- Observation Level: q15 minute checks             -- Vital signs:  q12 hours             -- Precautions: suicide, elopement, and assault   2. Psychiatric Diagnoses and Treatment:  Haldol  0.5 mg twice daily patient has been consistently refusing-Will discontinue it we will continue to assess for the need for nonemergent forced medication by 2 physicians Patient was on Clozaril  in the past but given her noncompliance is not the right option at this time Cymbalta  20 mg daily increased to 40 mg   -- The risks/benefits/side-effects/alternatives to this medication were discussed in detail with the patient and time was given for questions. The patient consents to medication trial.                -- Metabolic profile and EKG monitoring obtained while on an atypical antipsychotic (BMI: Lipid Panel: HbgA1c: QTc:)              -- Encouraged patient to participate in unit milieu and in scheduled group therapies                            3. Medical Issues Being Addressed:   4. Discharge Planning:   -- Social work and case management to assist with discharge planning and identification of hospital follow-up needs prior to discharge  -- Estimated LOS: 3-4 days  Allyn Foil, MD 06/22/2024, 10:58 PM

## 2024-06-22 NOTE — BHH Counselor (Signed)
 Adult Comprehensive Assessment  Patient ID: Valerie [name], female   DOB: 17-Aug-1960, 63 y.o.   MRN: [id number]  Information Source: Information source: Patient  Current Stressors:  Patient states their primary concerns and needs for treatment are:: UTA Patient states their goals for this hospitilization and ongoing recovery are:: Counsellor / Learning stressors: UTA Employment / Job issues: UTA Family Relationships: Oncologist / Lack of resources (include bankruptcy): UTA Housing / Lack of housing: UTA Physical health (include injuries & life threatening diseases): UTA Social relationships: UTA Substance abuse: UTA Bereavement / Loss: UTA  Living/Environment/Situation:  Living Arrangements:  (UTA) Living conditions (as described by patient or guardian): UTA Who else lives in the home?: UTA How long has patient lived in current situation?: UTA What is atmosphere in current home:  (UTA)  Family History:  Marital status:  (UTA) Are you sexually active?:  (UTA) What is your sexual orientation?: UTA Has your sexual activity been affected by drugs, alcohol, medication, or emotional stress?: UTA Does patient have children?:  (UTA) How many children?:  (UTA) How is patient's relationship with their children?: UTA  Childhood History:  By whom was/is the patient raised?:  (UTA) Additional childhood history information: UTA Description of patient's relationship with caregiver when they were a child: UTA Patient's description of current relationship with people who raised him/her: UTA How were you disciplined when you got in trouble as a child/adolescent?: UTA Does patient have siblings?:  (UTA) Did patient suffer any verbal/emotional/physical/sexual abuse as a child?:  (UTA) Did patient suffer from severe childhood neglect?:  (UTA) Has patient ever been sexually abused/assaulted/raped as an adolescent or adult?:  (UTA) Was the patient ever a victim of a crime or a  disaster?:  (UTA) Witnessed domestic violence?:  (UTA) Has patient been affected by domestic violence as an adult?:  INDUSTRIAL/PRODUCT DESIGNER)  Education:  Highest grade of school patient has completed: UTA Currently a consulting civil engineer?:  (UTA) Learning disability?:  (UTA)  Employment/Work Situation:   Employment Situation:  INDUSTRIAL/PRODUCT DESIGNER) Patient's Job has Been Impacted by Current Illness:  (UTA) What is the Longest Time Patient has Held a Job?: UTA Where was the Patient Employed at that Time?: UTA Has Patient ever Been in the [employer]?:  (UTA)  Financial Resources:   Financial resources:  INDUSTRIAL/PRODUCT DESIGNER) Does patient have a lawyer or guardian?:  (UTA)  Alcohol/Substance Abuse:   What has been your use of drugs/alcohol within the last 12 months?: UTA If attempted suicide, did drugs/alcohol play a role in this?:  (UTA) Alcohol/Substance Abuse Treatment Hx:  (UTA) If yes, describe treatment: UTA Has alcohol/substance abuse ever caused legal problems?:  (UTA)  Social Support System:   Patient's Community Support System:  (UTA) Describe Community Support System: UTA Type of faith/religion: UTA How does patient's faith help to cope with current illness?: UTA  Leisure/Recreation:   Do You Have Hobbies?:  (UTA) Leisure and Hobbies: UTA  Strengths/Needs:   What is the patient's perception of their strengths?: UTA Patient states they can use these personal strengths during their treatment to contribute to their recovery: UTA Patient states these barriers may affect/interfere with their treatment: UTA Patient states these barriers may affect their return to the community: UTA Other important information patient would like considered in planning for their treatment: UTA  Discharge Plan:   Currently receiving community mental health services:  (UTA) Patient states concerns and preferences for aftercare planning are: UTA Patient states they will know when they are safe and ready for discharge when:  UTA Does  patient have access to transportation?:  (UTA) Does patient have financial barriers related to discharge medications?:  (UTA) Patient description of barriers related to discharge medications: UTA Will patient be returning to same living situation after discharge?:  (UTA)  Summary/Recommendations:   Summary and Recommendations (to be completed by the evaluator): Patient is a 63 year-old female from [place], [place] [facility] According to H&P,  presenting voluntary to [facility] requesting a psychiatric evaluation for worsening psychosis. Patient has history of schizophrenia. Patient denied SI, HI, psychosis and alcohol/drug usage. [name]. Patient reports people are coming by my door talking loud, yelling and screaming and coming in my room with keys. Patient reports having multiple medical Afibs and blood clots due to this. Patient reports people are doing things in the dining room. When asked if she called EMS, patient stated I called EMS, I stripped searched over the phone and called the authorities. Upon assessment pt reports she was living in the [facility] and that she no longer wants to return there. Pt reported during treatment team that her goal is to be discharged to her husband, whom she reports she has not met yet. Pt is paranoid and delusion. Pt unable to sign consents at this time. Pt's primary diagnosis is Schizophrenia (HCC) (F20.9).  Recommendations include: crisis stabilization, therapeutic milieu, encourage group attendance and participation, medication management for mood stabilization and development of comprehensive mental wellness/sobriety plan.  Valerie [name]. 06/22/2024

## 2024-06-22 NOTE — BHH Counselor (Signed)
 CSW contacted [facility] to discuss if pt had a legal guardian and if they had paperwork to substantiate the guardianship.   [facility] reports that pt's mother NOT actually her legal guardian and that she is an emergency contact.   CSW inquired about pt's mother's phone number.   [facility] reports that pt's emergency contact was changed from her mother recently to her sister, Valerie [name] [phone]).   CSW contacted Debroh Sieloff [phone]). CSW unable to reach, left HIPAA compliant VM requesting return call.   Lum Croft, MSW, [place] 06/22/2024 11:26 AM

## 2024-06-22 NOTE — Progress Notes (Signed)
°   06/22/24 1700  Psych Admission Type (Psych Patients Only)  Admission Status Voluntary  Psychosocial Assessment  Patient Complaints Anxiety  Eye Contact Staring  Facial Expression Animated  Affect Preoccupied  Speech Logical/coherent  Interaction Assertive;Guarded  Motor Activity Slow  Appearance/Hygiene Unremarkable  Behavior Characteristics Resistant to care  Mood Preoccupied  Thought Process  Coherency WDL  Content Paranoia  Delusions Somatic  Perception Hallucinations  Hallucination Auditory  Judgment Impaired  Confusion Mild  Danger to Self  Current suicidal ideation? Denies  Danger to Others  Danger to Others None reported or observed

## 2024-06-22 NOTE — Group Note (Signed)
 Date:  06/22/2024 Time:  3:59 PM  Group Topic/Focus:     Music for gero psych (geriatric psychology/dementia care) focuses on familiar, personalized music from a person's memory bump to reduce agitation, improve mood, spark memories, and enhance communication, with genres like soft classical, jazz, hymns, and old favorites (e.g., Singing in the [place], [place], big band) being effective, alongside tailored music therapy techniques that match tempo and use simple melodies for calming effects.   Participation Level:  Did Not Attend   [name] 06/22/2024, 3:59 PM

## 2024-06-22 NOTE — Progress Notes (Signed)
°   06/21/24 1345  Spiritual Encounters  Type of Visit Initial  Care provided to: Patient  Referral source Chaplain assessment;Social worker/Care management/TOC  Reason for visit Routine spiritual support  OnCall Visit No  Interventions  Spiritual Care Interventions Made Established relationship of care and support   Per referral from [place], [place] and my own assessment I sought to establish a relationship of care with Ms Valerie [name].  We met in dayroom apart from other peers and Valerie [name] welcomed my visit. At time I first entered dayroom, Valerie [name] had been singing so I began by acknowledging this and affirming what she seemed to enjoy. Valerie [name] shared that she sees self as a people person and full of joy. She named belief in [place] and engaged in some brief life review.  This chaplain invited Valerie [name] to stay for spirituality group starting soon. Valerie [name] seemed excited to join.  Kaine Mcquillen L. Delores HERO.Div

## 2024-06-22 NOTE — Group Note (Signed)
 Recreation Therapy Group Note   Group Topic:Leisure Education  Group Date: 06/22/2024 Start Time: 1400 End Time: 1440 Facilitators: Celestia Jeoffrey BRAVO, LRT, CTRS Location: Courtyard  Group Description: Music. Patients are encouraged to name their favorite song(s) for LRT to play song through speaker for group to hear, while in the courtyard getting fresh air and sunlight. Patients educated on the definition of leisure and the importance of having different leisure interests outside of the hospital. Group discussed how leisure activities can often be used as pharmacologist and that listening to music and being outside are examples.    Goal Area(s) Addressed:  Patient will identify a current leisure interest.  Patient will practice making a positive decision. Patient will have the opportunity to try a new leisure activity.   Affect/Mood: N/A   Participation Level: Did not attend    Clinical Observations/Individualized Feedback: Patient did not attend group.   Plan: Continue to engage patient in RT group sessions 2-3x/week.   Jeoffrey BRAVO Celestia, LRT, CTRS 06/22/2024 4:44 PM

## 2024-06-22 NOTE — Plan of Care (Signed)
   Problem: Education: Goal: Emotional status will improve Outcome: Progressing Goal: Mental status will improve Outcome: Progressing

## 2024-06-22 NOTE — Plan of Care (Signed)

## 2024-06-22 NOTE — BHH Counselor (Signed)
 CSW received return call from Nancy Arvin [phone])   Joy reports pt has lived in [place] for the last 12-15 years and pays her own bills and reports pt has always said she has been very happy there.   Joy reports pt became medically sick over a month ago and [address] took pt the hospital in [place], [place].   Joy reports that in the hospital they took pt off of all her psychiatric medication and that's when this occurred.   Joy reports pt says strange things.   Joy reports that pt is her OWN guardian, but that she and her mother Mikle Ina, [phone]) have discussed guardianship for pt.   CSW will inform provider of collateral information.    Lum Croft, MSW, [place] 06/22/2024 4:00 PM

## 2024-06-22 NOTE — Group Note (Signed)
 Date:  06/22/2024 Time:  10:59 AM  Group Topic/Focus:  Self Care:   The focus of this group is to help patients understand the importance of self-care in order to improve or restore emotional, physical, spiritual, interpersonal, and financial health. We did chair yoga to stretch our body's and get moving.    Participation Level:  Did Not Attend  Valerie [name] 06/22/2024, 10:59 AM

## 2024-06-23 DIAGNOSIS — F250 Schizoaffective disorder, bipolar type: Secondary | ICD-10-CM | POA: Diagnosis not present

## 2024-06-23 LAB — COMPREHENSIVE METABOLIC PANEL WITH GFR
ALT: 17 U/L (ref 0–44)
AST: 35 U/L (ref 15–41)
Albumin: 4.3 g/dL (ref 3.5–5.0)
Alkaline Phosphatase: 126 U/L (ref 38–126)
Anion gap: 10 (ref 5–15)
BUN: 7 mg/dL — ABNORMAL LOW (ref 8–23)
CO2: 28 mmol/L (ref 22–32)
Calcium: 9.7 mg/dL (ref 8.9–10.3)
Chloride: 86 mmol/L — ABNORMAL LOW (ref 98–111)
Creatinine, Ser: 0.55 mg/dL (ref 0.44–1.00)
GFR, Estimated: >60 mL/min (ref >60)
Glucose, Bld: 108 mg/dL — ABNORMAL HIGH (ref 70–99)
Potassium: 3.8 mmol/L (ref 3.5–5.1)
Sodium: 123 mmol/L — ABNORMAL LOW (ref 135–145)
Total Bilirubin: 0.5 mg/dL (ref 0.0–1.2)
Total Protein: 7.6 g/dL (ref 6.5–8.1)

## 2024-06-23 MED ORDER — ACETAMINOPHEN 325 MG PO TABS
650.0000 mg | ORAL_TABLET | Freq: Four times a day (QID) | ORAL | Status: AC | PRN
  Administered 2024-06-23 – 2024-08-14 (×18): 650 mg via ORAL
  Filled 2024-06-23 (×19): qty 2

## 2024-06-23 MED ORDER — GABAPENTIN 100 MG PO CAPS: 100.0000 mg | ORAL_CAPSULE | Freq: Once | ORAL | Status: DC

## 2024-06-23 NOTE — Progress Notes (Signed)
 Pt up to the nurses station stating I will take the tylenol  now. Just give me the right thing. MD made aware. Pt given prn Tylenol  for leg pain.

## 2024-06-23 NOTE — Progress Notes (Signed)
 [facility] MD Progress Note  06/23/2024 11:02 PM Valerie [name]  MRN:  [id number]   Valerie [name] is a 63 year old female presenting voluntary to [facility] requesting a psychiatric evaluation for worsening psychosis. Patient has history of schizophrenia. Patient denied SI, HI, psychosis and alcohol/drug usage. Patient resides at [facility]. Patient reports people are coming by my door talking loud, yelling and screaming and coming in my room with keys. Patient reports having multiple medical Afibs and blood clots due to this. Patient reports people are doing things in the dining room. When asked if she called EMS, patient stated I called EMS, I stripped searched over the phone and called the authorities. Patient is admitted to [facility] unit with Q15 min safety monitoring. Multidisciplinary team approach is offered. Medication management; group/milieu therapy is offered.  Subjective:  Chart reviewed, case discussed in multidisciplinary meeting, patient seen during rounds.   Patient is noted to be sitting in her room.  She reports not feeling well and when provider asked the reason.  Patient reports that she had Zyprexa  toxicity.  She reports that 2 cops came to her and give her injection and she is unwell.  She is unable to describe being unwell means what symptoms.  Patient also then talks about Haldol  toxicity.  Provider educated the patient that Haldol  was discontinued as patient has not been taking the medications.  Patient was encouraged to take Clazuril but patient continues to decline.  Patient was explained extensively about two-physician evaluation for nonemergent forced medication.  Patient was informed that Dr. Ruther will be seeing her tomorrow and once he agrees with the nonemergent forced medication plan she will be receiving IM medications if she refuses p.o.  She denies SI/HI/plan.  She displays extreme delusions and paranoia about medications Past Psychiatric  History: see h&P Family History:  Family History  Problem Relation Age of Onset   Colon cancer Neg Hx    Celiac disease Neg Hx    Inflammatory bowel disease Neg Hx    Social History:  Social History   Substance and Sexual Activity  Alcohol Use Never     Social History   Substance and Sexual Activity  Drug Use Never    Social History   Socioeconomic History   Marital status: Single    Spouse name: Not on file   Number of children: Not on file   Years of education: Not on file   Highest education level: Not on file  Occupational History   Not on file  Tobacco Use   Smoking status: Never   Smokeless tobacco: Never  Substance and Sexual Activity   Alcohol use: Never   Drug use: Never   Sexual activity: Not Currently    Birth control/protection: Pill  Other Topics Concern   Not on file  Social History Narrative   Not on file   Social Drivers of Health   Tobacco Use: Low Risk (06/19/2024)   Patient History    Smoking Tobacco Use: Never    Smokeless Tobacco Use: Never    Passive Exposure: Not on file  Financial Resource Strain: Not on file  Food Insecurity: Unknown (06/19/2024)   Epic    Worried About Programme Researcher, Broadcasting/film/video in the Last Year: Never true    [employer] of Food in the Last Year: Patient declined  Transportation Needs: No Transportation Needs (06/19/2024)   Epic    Lack of Transportation (Medical): No    Lack of Transportation (Non-Medical):  No  Physical Activity: Not on file  Stress: Not on file  Social Connections: Not on file  Depression (PHQ2-9): Low Risk (12/21/2022)   Depression (PHQ2-9)    PHQ-2 Score: 0  Alcohol Screen: Low Risk (06/19/2024)   Alcohol Screen    Last Alcohol Screening Score (AUDIT): 0  Housing: Low Risk (06/19/2024)   Epic    Unable to Pay for Housing in the Last Year: No    Number of Times Moved in the Last Year: 0    Homeless in the Last Year: No  Utilities: Not At Risk (06/19/2024)   Epic    Threatened with loss of  utilities: No  Health Literacy: Not on file   Past Medical History:  Past Medical History:  Diagnosis Date   Chronic lower extremity pain (2ry area of Pain) (Right) 12/21/2022   Chronic pain syndrome 12/20/2022   DDD (degenerative disc disease), 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 Tobie: Pancolonic diverticulosis, tortuous colon, next colonoscopy 2024   TONSILLECTOMY      Current Medications: Current Facility-Administered Medications  Medication Dose Route Frequency Provider Last Rate Last Admin   acetaminophen  (TYLENOL ) tablet 650 mg  650 mg Oral Q6H PRN Onuoha, Chinwendu V, NP   650 mg at 06/23/24 2058   alum & mag hydroxide-simeth (MAALOX/MYLANTA) 200-200-20 MG/5ML suspension 30 mL  30 mL Oral Q4H PRN Bobbitt, Shalon E, NP       amLODipine  (NORVASC ) tablet 5 mg  5 mg Oral Daily Tadarrius Burch, MD   5 mg at 06/23/24 9041   aspirin  EC tablet 81 mg  81 mg Oral Daily Bobbitt, Shalon E, NP   81 mg at 06/23/24 0957   bisacodyl  (DULCOLAX) EC tablet 10 mg  10 mg Oral Daily PRN Ilene Witcher, MD       cholecalciferol  (VITAMIN D3) 25 MCG (1000 UNIT) tablet 1,000 Units  1,000 Units Oral Weekly Daren Yeagle, MD   1,000 Units at 06/20/24 1232   cloZAPine  (CLOZARIL ) tablet 50 mg  50 mg Oral BID Bobbitt, Shalon E, NP       diclofenac  Sodium (VOLTAREN ) 1 % topical gel 4 g  4 g Topical QID Donnelly Mellow, MD   4 g at 06/23/24 2101   DULoxetine  (CYMBALTA ) DR capsule 40 mg  40 mg Oral Daily Dalonda Simoni, MD   40 mg at 06/23/24 9042   famotidine  (PEPCID ) tablet 20 mg  20 mg Oral Daily Ezekiel Menzer, MD   20 mg at 06/23/24 9041   fluticasone  (FLONASE ) 50 MCG/ACT nasal spray 1 spray  1 spray Each Nare Daily Bobbitt, Shalon E, NP       gabapentin  (NEURONTIN ) capsule 100 mg  100 mg Oral Once Onuoha, Chinwendu V, NP       gabapentin  (NEURONTIN )  capsule 300 mg  300 mg Oral TID Bobbitt, Shalon E, NP   300 mg at 06/23/24 2059   hydrochlorothiazide  (HYDRODIURIL ) tablet 25 mg  25 mg Oral Daily Alexius Ellington, MD   25 mg at 06/23/24 9041   hydrOXYzine  (ATARAX ) tablet 25 mg  25 mg Oral TID PRN Bobbitt, Shalon E, NP   25 mg at 06/23/24 2059   linagliptin  (TRADJENTA ) tablet 5 mg  5 mg Oral Daily Bobbitt, Shalon E, NP   5 mg at 06/23/24 0957   lisinopril  (ZESTRIL )  tablet 30 mg  30 mg Oral Daily Bobbitt, Shalon E, NP   30 mg at 06/23/24 9040   magnesium  hydroxide (MILK OF MAGNESIA) suspension 30 mL  30 mL Oral Daily PRN Bobbitt, Shalon E, NP       metFORMIN  (GLUCOPHAGE ) tablet 1,000 mg  1,000 mg Oral Q supper Bobbitt, Shalon E, NP   1,000 mg at 06/23/24 1700   multivitamin with minerals tablet 1 tablet  1 tablet Oral Daily Bobbitt, Shalon E, NP   1 tablet at 06/23/24 9042   naproxen  (NAPROSYN ) tablet 375 mg  375 mg Oral TID WC Leann Mayweather, MD   375 mg at 06/23/24 1612   OLANZapine  (ZYPREXA ) injection 5 mg  5 mg Intramuscular TID PRN Bobbitt, Shalon E, NP   5 mg at 06/22/24 2259   OLANZapine  zydis (ZYPREXA ) disintegrating tablet 5 mg  5 mg Oral TID PRN Bobbitt, Shalon E, NP       ondansetron  (ZOFRAN ) tablet 4 mg  4 mg Oral Q8H PRN Bobbitt, Shalon E, NP       potassium chloride  SA (KLOR-CON  M) CR tablet 20 mEq  20 mEq Oral BID Bobbitt, Shalon E, NP   20 mEq at 06/23/24 2059   propranolol  (INDERAL ) tablet 40 mg  40 mg Oral BID Bobbitt, Shalon E, NP   40 mg at 06/23/24 2059   rosuvastatin  (CRESTOR ) tablet 5 mg  5 mg Oral Daily Bobbitt, Shalon E, NP   5 mg at 06/23/24 9041   traZODone  (DESYREL ) tablet 50 mg  50 mg Oral QHS PRN Bobbitt, Shalon E, NP       zolpidem (AMBIEN) tablet 5 mg  5 mg Oral QHS Jeremaine Maraj, MD   5 mg at 06/23/24 2102    Lab Results:  Results for orders placed or performed during the hospital encounter of 06/19/24 (from the past 48 hours)  Comprehensive metabolic panel     Status: Abnormal   Collection Time: 06/23/24  12:19 PM  Result Value Ref Range   Sodium 123 (L) 135 - 145 mmol/L   Potassium 3.8 3.5 - 5.1 mmol/L   Chloride 86 (L) 98 - 111 mmol/L   CO2 28 22 - 32 mmol/L   Glucose, Bld 108 (H) 70 - 99 mg/dL    Comment: Glucose reference range applies only to samples taken after fasting for at least 8 hours.   BUN 7 (L) 8 - 23 mg/dL   Creatinine, Ser 9.44 0.44 - 1.00 mg/dL   Calcium  9.7 8.9 - 10.3 mg/dL   Total Protein 7.6 6.5 - 8.1 g/dL   Albumin 4.3 3.5 - 5.0 g/dL   AST 35 15 - 41 U/L   ALT 17 0 - 44 U/L   Alkaline Phosphatase 126 38 - 126 U/L   Total Bilirubin 0.5 0.0 - 1.2 mg/dL   GFR, Estimated >39 >39 mL/min    Comment: (NOTE) Calculated using the CKD-EPI Creatinine Equation (2021)    Anion gap 10 5 - 15    Comment: Performed at [facility], [address] [place], [place] [address]     Blood Alcohol level:  Lab Results  Component Value Date   [facility] <15 06/18/2024    Metabolic Disorder Labs: No results found for: HGBA1C, MPG No results found for: PROLACTIN No results found for: CHOL, TRIG, HDL, CHOLHDL, VLDL, LDLCALC  Physical Findings: AIMS:  , ,  ,  ,    CIWA:    COWS:      Psychiatric Specialty  Exam:  Presentation  General Appearance:  Casual  Eye Contact: Fleeting  Speech: Normal Rate  Speech Volume: Increased    Mood and Affect  Mood: Euphoric  Affect: Labile   Thought Process  Thought Processes: Disorganized  Orientation:Full (Time, Place and Person)  Thought Content:Illogical; Delusions; Tangential  Hallucinations: Denies  Ideas of Reference:Delusions  Suicidal Thoughts: Denies  Homicidal Thoughts: Denies   Sensorium  Memory: Immediate Fair; Remote Fair  Judgment: Impaired  Insight: Shallow   Executive Functions  Concentration: Poor  Attention Span: Poor  Recall: [employer] of Knowledge: Fair  Language: Fair   Psychomotor Activity  Psychomotor Activity: No data  recorded  Musculoskeletal: Strength & Muscle Tone: within normal limits Gait & Station: normal Assets  Assets: Manufacturing Systems Engineer; Desire for Improvement    Physical Exam: Physical Exam ROS Blood pressure 135/76, pulse 77, temperature 98.6 F (37 C), temperature source Oral, resp. rate 16, height 5' 5 (1.651 m), weight 81 kg, SpO2 100%. Body mass index is 29.7 kg/m.  Diagnosis: Active Problems:   Schizoaffective disorder, bipolar type [facility])  Clinical Decision Making: Patient is currently admitted for worsening psychosis, manic symptoms, tangential grandiosity.  She needs inpatient hospitalization for medication management and close monitoring.  Patient is unable to make decisions on her own as she is unable to appreciate and understand her mental health history, the current need for treatment.  [facility] has screened her case and for further evaluation for legal guardianship  Treatment Plan Summary:  Safety and Monitoring: If patient meets criteria for nonemergent forced medication we will need to change her status to involuntary commitment             -- Voluntary admission to inpatient psychiatric unit for safety, stabilization and treatment             -- Daily contact with patient to assess and evaluate symptoms and progress in treatment             -- Patient's case to be discussed in multi-disciplinary team meeting             -- Observation Level: q15 minute checks             -- Vital signs:  q12 hours             -- Precautions: suicide, elopement, and assault   2. Psychiatric Diagnoses and Treatment:  Haldol  0.5 mg twice daily patient has been consistently refusing-Will discontinue it we will continue to assess for the need for nonemergent forced medication by 2 physicians Patient was on Clozaril  in the past but given her noncompliance is not the right option at this time Cymbalta  20 mg daily increased to 40 mg   -- The risks/benefits/side-effects/alternatives to this  medication were discussed in detail with the patient and time was given for questions. The patient consents to medication trial.                -- Metabolic profile and EKG monitoring obtained while on an atypical antipsychotic (BMI: Lipid Panel: HbgA1c: QTc:)              -- Encouraged patient to participate in unit milieu and in scheduled group therapies                            3. Medical Issues Being Addressed:   4. Discharge Planning:   -- Social work and case management to assist with  discharge planning and identification of hospital follow-up needs prior to discharge  -- Estimated LOS: 3-4 days  Allyn Foil, MD 06/23/2024, 11:02 PM

## 2024-06-23 NOTE — Progress Notes (Signed)
 Pt agitated loud and not redirectable. Pt disruptive to peers and repeatedly multiple times pressing the emergency call light and speaking loudly. Several attempts of verbal deesclation and redirection failed. Security called to assist with prn Zyprexa  IM. Pt directed to sit on the bed. Pt repeating I do not take Zyprexa . Writer walked with pt to her bed and pt sat down. Pt yelling and noted tearful. Pt given IM Zyprexa  without resistance and pt compliant with IM injection. Security present for support.    Prn had a decrease effect but, pt restless throughout the night. Pt stating I am in afib see VS check  below during this complaint. Pt intermittently tearful and then loud yelling while sitting in front of the nurses station. Pt asking staff to call 911. Pt asking security for help when they came around during their rounds. Pt stating you all gave me a overdose of Zyprexa  you were suppose to give me 0.4 not 0.5. Pt noted to report she vomited. Upon checking pts room vomit noted on the bathroom floor. Pt offered prn medication but, refusing. Pt room sanitized and cleaned. Pt given ginger ale per request.    Patient Vitals for the past 24 hrs:   BP Temp Pulse Resp SpO2  06/22/24 2252 (!) 156/107 98.1 F (36.7 C) 94 19 100 %  06/22/24 1942 (!) 171/114 (!) 96.8 F (36 C) 89 18 100 %  06/22/24 0723 (!) 158/93 97.6 F (36.4 C) 89 16 98 %

## 2024-06-23 NOTE — B[facility] Counselor (Signed)
 Pt was visited by  [facility] APS Roetta Louder), pt completed assessment with APS caseworker.   CSW provided facesheet, MAR, H&P and progress notes to Sonja, per her request.   CSW escorted Sonja off of the unit without incident.   Lum Croft, MSW, [place] 06/23/2024 4:15 PM

## 2024-06-23 NOTE — Plan of Care (Signed)
°  Problem: Health Behavior/Discharge Planning: Goal: Compliance with treatment plan for underlying cause of condition will improve Outcome: Not Progressing   Problem: Physical Regulation: Goal: Ability to maintain clinical measurements within normal limits will improve Outcome: Not Progressing

## 2024-06-23 NOTE — Plan of Care (Signed)

## 2024-06-23 NOTE — Group Note (Signed)
 Date:  06/23/2024 Time:  3:31 PM  Group Topic/Focus:  House rules and expectations for the unit    Participation Level:  Did Not Attend    [name] 06/23/2024, 3:31 PM

## 2024-06-23 NOTE — Group Note (Signed)
 Date:  06/23/2024 Time:  12:34 PM  Group Topic/Focus:  Identifying Needs:   The focus of this group is to help patients identify their personal needs that have been historically problematic and identify healthy behaviors to address their needs.    Participation Level:  Did Not Attend   Arland Nutting 06/23/2024, 12:34 PM

## 2024-06-23 NOTE — Progress Notes (Addendum)
°   06/22/24 2300  Psych Admission Type (Psych Patients Only)  Admission Status Voluntary  Psychosocial Assessment  Patient Complaints Agitation;Anxiety;Confusion;Crying spells;Irritability;Restlessness  Eye Contact Staring  Facial Expression Animated  Affect Preoccupied;Irritable;Anxious  Speech Loud;Argumentative  Interaction Defensive;Assertive  Motor Activity Slow  Appearance/Hygiene Unremarkable  Behavior Characteristics Anxious;Agitated;Irritable;Resistant to care;Restless  Mood Anxious;Irritable;Preoccupied  Thought Process  Coherency Tangential  Content Blaming others;Paranoia;Preoccupation  Delusions Somatic;Paranoid  Perception Hallucinations  Hallucination Auditory  Judgment Impaired  Confusion Mild  Danger to Self  Current suicidal ideation? Denies  Danger to Others  Danger to Others None reported or observed   Mood/Behavior:  Pt noted to be irritable and argumentative with staff during med administration. Pt stating I take more than 4 pills. 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 I need a registered nurse in here.I unassigned you. Pt hesitant to take medication holding the medication cup. Pt then walk towards clinical research associate gesturing for clinical research associate to exit the room door. Writer exit out of pts room. Pt then noted to stand outside of her door still holding medication cup and yelling I take more than 4 medications. Several attempts to redirect pt failed. Pt continue to be loud and demanding. Pt then took medication. Pt then noted to press the call light in her room repeatedly when staff attempt to assist pt. Pt refusing to talk to staff asking for a registered nurse fixated on her not getting all of her medications. Pt asked what medications she was referring to and pt refusing to answer. Pt continued to press emergency call light repeatedly stating medical alert.  Interaction / Group attendance: Loud, disruptive and argumentative. Did not attend group.    Psych assessment: Denies SI/HI  and AVH.      Medication/ PRNs: Non-compliant with some of  scheduled medications. Refused PRNs when offered. .   Pain: No pain  15 min checks in place for safety.

## 2024-06-23 NOTE — Group Note (Signed)
 Physical/Occupational Therapy Group Note  Group Topic: UE Therex   Group Date: 06/23/2024 Start Time: 1300 End Time: 1345 Facilitators: Clive Warren CROME, OT   Group Description: Group instructed in series of upper extremities exercises, aimed to promote strength, flexibility, range of motion and functional endurance.  Patients provided cuing for proper mechanics and proper pace of exercise; exercises adjusted as necessary for individualized patient needs.  Patient also engaged in cognitive components throughout session, working to integrate attention to task, command following, turn-taking and appropriate social interaction throughout session.  Allowed to ask questions as appropriate, and encouraged to identify specific exercises that they could complete independently outside of group sessions.  Therapeutic Goal(s):  Demonstrate appropriate performance of upper extremity exercises to promote strength, flexibility, range of motion and functional endurance Identify 2-3 specific upper extremity exercises to complete as home exercise program outside of group session  Individual Participation: Pt came to beginning of the group, agreeable to hang out but declined to participate in exercises stating she had already completed them earlier this week. Pt quiet, did not engage with group discussion. Left after initial 10-3min and did not return.   Participation Level: Minimal   Participation Quality: Independent   Behavior: Alert, Disinterested, and On-looking   Speech/Thought Process: Barely audible   Affect/Mood: Appropriate and Stable    Insight: Fair   Judgement: Fair   Modes of Intervention: Activity, Clarification, Discussion, Education, Exploration, Problem-solving, Rapport Building, Socialization, and Support  Patient Response to Interventions:  Avoidant, Disengaged, and Resistant    Plan: Continue to engage patient in PT/OT groups 1 - 2x/week.  Muzamil Harker R., MPH, MS, OTR/L ascom  [phone] 06/23/2024, 4:31 PM
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
## 2024-06-23 NOTE — [facility] Group Notes (Signed)
 Spirituality Group   Description: Participant directed exploration of values, beliefs and meaning   **Focus on Community & Connections: What does community look like to you? How do we know we are connecting?  What are barriers to connecting/relationship with others? Reflect and locate spiritual aspects (eg, purpose, joy, meaning, sense of self) of connection.   **Group named topics of acceptance, forgiveness, and openness to receiving (help, etc.)   Following a brief framework of chaplains role and ground rules of group behavior, participants are invited to share concerns or questions that engage spiritual life. Emphasis placed on common themes and shared experiences and ways to make meaning and clarify living into ones values.   Theory/Process/Goal: Utilize the theoretical framework of group therapy established by Celena Kite, Relational Cultural Theory and Rogerian approaches to facilitate relational empathy and use of the here and now to foster reflection, self-awareness, and sharing.   Observations: Valerie [name] was an active participant in group discussion. Tendency to dominate, but responded to gentle redirection and allowed others to share.  Valerie [name] HERO.Div

## 2024-06-23 NOTE — Progress Notes (Signed)
°   06/22/24 1205  Spiritual Encounters  Type of Visit Follow up  Care provided to: Patient  Referral source Patient request  Reason for visit Routine spiritual support  OnCall Visit No  Interventions  Spiritual Care Interventions Made Prayer   While rounding on unit Valerie [name] requested a visit. I provided spiritual care support one on one in her room.  She shared a feeling of crossings that she stated caused pain in her joints/limbs and that she attributed to a family member Valerie [name]). She applied cream to points on elbows, shoulders, legs which she had initially asked for chaplain assistance.  This chaplain declined to engage in personal care and encouraged Valerie [name] to care for self. I invited meaning making around what crossings meant to her and about that person; invited any other concerns. Based on knowledge of faith belief from previous day, I asked whether prayer might be effective to dispel concerns. I offered prayer with Valerie [name]'s consent and offered words of encouragement.  Valerie [name] HERO.Div

## 2024-06-23 NOTE — Progress Notes (Signed)
°   06/23/24 1500  Psych Admission Type (Psych Patients Only)  Admission Status Voluntary  Psychosocial Assessment  Patient Complaints Anxiety;Hyperactivity;Tension  Eye Contact Staring  Facial Expression Animated  Affect Preoccupied  Speech Logical/coherent  Interaction Assertive;Guarded  Motor Activity Slow  Appearance/Hygiene Unremarkable  Behavior Characteristics Guarded;Hypersexual;Resistant to care;Anxious  Mood Anxious;Labile;Suspicious;Preoccupied  Thought Process  Coherency WDL  Content Paranoia  Delusions Somatic  Perception Hallucinations  Hallucination Auditory  Judgment Impaired  Confusion Mild  Danger to Self  Current suicidal ideation? Denies  Danger to Others  Danger to Others None reported or observed   Ultrasound of LLE ordered due to complaints of 10/10 pain and warm to touch upon palpation.

## 2024-06-23 NOTE — B[facility] Counselor (Signed)
 CSW contacted [facility] APS [phone] ext [phone])  CSW submitted report to APS.   Lum Croft, MSW, [place] 06/23/2024 9:34 AM

## 2024-06-23 NOTE — Group Note (Signed)
 Recreation Therapy Group Note   Group Topic:Leisure Education  Group Date: 06/23/2024 Start Time: 1405 End Time: 1500 Facilitators: Celestia Jeoffrey BRAVO, LRT, CTRS Location: Dayroom  Group Description: Bingo. Patients played multiple rounds of bingo. LRT and pts discussed the definition of leisure, things they do in their free time outside of the hospital, and how bingo is also a leisure activity. Pts received a coloring book, word search book, or journal as a prize.    Goal Area(s) Addressed:  Patient will identify a current leisure interest.  Patient will learn the definition of leisure. Patient will have the opportunity to try a new leisure activity. Patient will communicate with peers and LRT.   Affect/Mood: N/A   Participation Level: Did not attend    Clinical Observations/Individualized Feedback: Patient did not attend group.   Plan: Continue to engage patient in RT group sessions 2-3x/week.   Jeoffrey BRAVO Celestia, LRT, CTRS 06/23/2024 5:03 PM

## 2024-06-23 NOTE — Progress Notes (Signed)
 Pt yelling out she is crossing my legs. The lesbian. Pt reporting 10/10 leg pain. Pt demanding to have Naproxen . Naproxen  not due at this time. Reviewed medication administration with pt. Pt responded no, it is not scheduled it is prn. Offered prn Tylenol  pt refusing. MD made aware see orders.   Pt refusing ordered gabapentin  for leg pain argumentative saying I cannot get gabapentin . I just got gabapentin  you all are trying to overdose me. Pt stated to writer you can't have sex with a man. Pt restless throughout the night asking to speak with Dr.J stating I am going to sit up and wait for her. Pt refusing to go to her room.

## 2024-06-23 NOTE — Group Note (Signed)
 Date:  06/23/2024 Time:  8:47 PM  Group Topic/Focus:  Healthy Communication:   The focus of this group is to discuss communication, barriers to communication, as well as healthy ways to communicate with others.    Participation Level:  Active  Participation Quality:  Appropriate  Affect:  Appropriate  Cognitive:  Appropriate  Insight: Good  Engagement in Group:  Engaged  Modes of Intervention:   Discussion Additional Comments:    Romero Earnie Hope 06/23/2024, 8:47 PM

## 2024-06-24 ENCOUNTER — Inpatient Hospital Stay

## 2024-06-24 DIAGNOSIS — F250 Schizoaffective disorder, bipolar type: Secondary | ICD-10-CM | POA: Diagnosis not present
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
## 2024-06-24 NOTE — Group Note (Signed)
 Date:  06/24/2024 Time:  10:16 AM  Group Topic/Focus:  Movement Therapy    Participation Level:  Did Not Attend    Valerie [name] Bias 06/24/2024, 10:16 AM

## 2024-06-24 NOTE — Group Note (Signed)
 [facility] LCSW Group Therapy Note   Group Date: 06/24/2024 Start Time: 1601 End Time: 1631   Type of Therapy and Topic: Group Therapy: Avoiding Self-Sabotaging and Enabling Behaviors  Participation Level: Did Not Attend    Description of Group:  In this group, patients will learn how to identify obstacles, self-sabotaging and enabling behaviors, as well as: what are they, why do we do them and what needs these behaviors meet. Discuss unhealthy relationships and how to have positive healthy boundaries with those that sabotage and enable. Explore aspects of self-sabotage and enabling in yourself and how to limit these self-destructive behaviors in everyday life.   Therapeutic Goals: 1. Patient will identify one obstacle that relates to self-sabotage and enabling behaviors 2. Patient will identify one personal self-sabotaging or enabling behavior they did prior to admission 3. Patient will state a plan to change the above identified behavior 4. Patient will demonstrate ability to communicate their needs through discussion and/or role play.    Summary of Patient Progress:      Therapeutic Modalities:  Cognitive Behavioral Therapy Person-Centered Therapy Motivational Interviewing    Valerie [name], LCSWA

## 2024-06-24 NOTE — Plan of Care (Signed)
   Problem: Education: Goal: Knowledge of [facility] Education information/materials will improve Outcome: Progressing Goal: Verbalization of understanding the information provided will improve Outcome: Progressing

## 2024-06-24 NOTE — Plan of Care (Signed)

## 2024-06-24 NOTE — Group Note (Signed)
 Date:  06/24/2024 Time:  8:52 PM  Group Topic/Focus:  Wrap-Up Group:   The focus of this group is to help patients review their daily goal of treatment and discuss progress on daily workbooks.    Participation Level:  Did Not Attend  Beatris ONEIDA Hasten 06/24/2024, 8:52 PM

## 2024-06-24 NOTE — BH IP Treatment Plan (Signed)
 Interdisciplinary Treatment and Diagnostic Plan Update  06/24/2024 Time of Session: 12:30pm Valerie [name] MRN: [id number]  Principal Diagnosis: Schizophrenia [facility])  Secondary Diagnoses: Active Problems:   Schizoaffective disorder, bipolar type (HCC)   Current Medications:  Current Facility-Administered Medications  Medication Dose Route Frequency Provider Last Rate Last Admin   acetaminophen  (TYLENOL ) tablet 650 mg  650 mg Oral Q6H PRN Onuoha, Chinwendu V, NP   650 mg at 06/24/24 0343   alum & mag hydroxide-simeth (MAALOX/MYLANTA) 200-200-20 MG/5ML suspension 30 mL  30 mL Oral Q4H PRN Bobbitt, Shalon E, NP       amLODipine  (NORVASC ) tablet 5 mg  5 mg Oral Daily Jadapalle, Sree, MD   5 mg at 06/24/24 1003   aspirin  EC tablet 81 mg  81 mg Oral Daily Bobbitt, Shalon E, NP   81 mg at 06/24/24 1003   bisacodyl  (DULCOLAX) EC tablet 10 mg  10 mg Oral Daily PRN Jadapalle, Sree, MD       cholecalciferol  (VITAMIN D3) 25 MCG (1000 UNIT) tablet 1,000 Units  1,000 Units Oral Weekly Jadapalle, Sree, MD   1,000 Units at 06/20/24 1232   cloZAPine  (CLOZARIL ) tablet 50 mg  50 mg Oral BID Bobbitt, Shalon E, NP       diclofenac  Sodium (VOLTAREN ) 1 % topical gel 4 g  4 g Topical QID Jadapalle, Sree, MD   4 g at 06/24/24 1422   DULoxetine  (CYMBALTA ) DR capsule 40 mg  40 mg Oral Daily Jadapalle, Sree, MD   40 mg at 06/24/24 1004   famotidine  (PEPCID ) tablet 20 mg  20 mg Oral Daily Jadapalle, Sree, MD   20 mg at 06/24/24 1004   fluticasone  (FLONASE ) 50 MCG/ACT nasal spray 1 spray  1 spray Each Nare Daily Bobbitt, Shalon E, NP       gabapentin  (NEURONTIN ) capsule 100 mg  100 mg Oral Once Onuoha, Chinwendu V, NP       gabapentin  (NEURONTIN ) capsule 300 mg  300 mg Oral TID Bobbitt, Shalon E, NP   300 mg at 06/24/24 1003   hydrochlorothiazide  (HYDRODIURIL ) tablet 25 mg  25 mg Oral Daily Jadapalle, Sree, MD   25 mg at 06/24/24 1003   hydrOXYzine  (ATARAX ) tablet 25 mg  25 mg Oral TID PRN Bobbitt, Shalon E, NP   25  mg at 06/23/24 2059   linagliptin  (TRADJENTA ) tablet 5 mg  5 mg Oral Daily Bobbitt, Shalon E, NP   5 mg at 06/24/24 1003   lisinopril  (ZESTRIL ) tablet 30 mg  30 mg Oral Daily Bobbitt, Shalon E, NP   30 mg at 06/24/24 1003   magnesium  hydroxide (MILK OF MAGNESIA) suspension 30 mL  30 mL Oral Daily PRN Bobbitt, Shalon E, NP       metFORMIN  (GLUCOPHAGE ) tablet 1,000 mg  1,000 mg Oral Q supper Bobbitt, Shalon E, NP   1,000 mg at 06/23/24 1700   multivitamin with minerals tablet 1 tablet  1 tablet Oral Daily Bobbitt, Shalon E, NP   1 tablet at 06/24/24 1003   naproxen  (NAPROSYN ) tablet 375 mg  375 mg Oral TID WC Jadapalle, Sree, MD   375 mg at 06/24/24 1226   OLANZapine  (ZYPREXA ) injection 5 mg  5 mg Intramuscular TID PRN Bobbitt, Shalon E, NP   5 mg at 06/22/24 2259   OLANZapine  zydis (ZYPREXA ) disintegrating tablet 5 mg  5 mg Oral TID PRN Bobbitt, Shalon E, NP       ondansetron  (ZOFRAN ) tablet 4 mg  4 mg Oral Q8H PRN  Bobbitt, Shalon E, NP       potassium chloride  SA (KLOR-CON  M) CR tablet 20 mEq  20 mEq Oral BID Bobbitt, Shalon E, NP   20 mEq at 06/24/24 1003   propranolol  (INDERAL ) tablet 40 mg  40 mg Oral BID Bobbitt, Shalon E, NP   40 mg at 06/24/24 1003   rosuvastatin  (CRESTOR ) tablet 5 mg  5 mg Oral Daily Bobbitt, Shalon E, NP   5 mg at 06/24/24 1003   traZODone  (DESYREL ) tablet 50 mg  50 mg Oral QHS PRN Bobbitt, Shalon E, NP       zolpidem  (AMBIEN ) tablet 5 mg  5 mg Oral QHS Jadapalle, Sree, MD   5 mg at 06/23/24 2102   PTA Medications: Medications Prior to Admission  Medication Sig Dispense Refill Last Dose/Taking   acetaminophen  (TYLENOL ) 650 MG CR tablet Take 650 mg by mouth every 8 (eight) hours as needed for pain.      benztropine (COGENTIN) 0.5 MG tablet Take by mouth.      bisacodyl  5 MG EC tablet As directed for procedure 8 tablet 0    Cholecalciferol  (VITAMIN D ) 125 MCG (5000 UT) CAPS Take 1,000 Units by mouth once a week.      cloZAPine  (CLOZARIL ) 100 MG tablet Take 200 mg by  mouth 2 (two) times daily.      DULoxetine  (CYMBALTA ) 20 MG capsule Take 20 mg by mouth daily.      fluticasone  (FLONASE ) 50 MCG/ACT nasal spray Place 1 spray into both nostrils daily.      gabapentin  (NEURONTIN ) 300 MG capsule Take 300 mg by mouth 3 (three) times daily.      haloperidol  (HALDOL ) 0.5 MG tablet Take 0.5 mg by mouth 2 (two) times daily.      hydrochlorothiazide  (HYDRODIURIL ) 25 MG tablet Take 25 mg by mouth daily.      lisinopril  (ZESTRIL ) 30 MG tablet Take 30 mg by mouth daily.      loperamide (IMODIUM) 2 MG capsule Take 2 mg by mouth as needed for diarrhea or loose stools. Take 1 capsule by mouth as needed with each loose stool/diarrhea nte 8 doses in 24 hr      Multiple Vitamin (DAILY VITE PO) Take 1 tablet by mouth daily.      naproxen  (NAPROSYN ) 500 MG tablet Take 500 mg by mouth every 12 (twelve) hours as needed.      omeprazole (PRILOSEC) 20 MG capsule Take 20 mg by mouth daily.      ondansetron  (ZOFRAN ) 4 MG tablet Take 4 mg by mouth every 8 (eight) hours as needed for nausea or vomiting.      potassium chloride  SA (KLOR-CON  M) 20 MEQ tablet Take 20 mEq by mouth 2 (two) times daily.      propranolol  (INDERAL ) 40 MG tablet Take 40 mg by mouth 2 (two) times daily.      rosuvastatin  (CRESTOR ) 5 MG tablet Take 5 mg by mouth daily.      sodium phosphate  (FLEET) ENEM As directed for procedure 133 mL 0    triamcinolone ointment (KENALOG) 0.1 % Apply topically.       Patient Stressors: Medication change or noncompliance    Patient Strengths: Ability for insight   Treatment Modalities: Medication Management, Group therapy, Case management,  1 to 1 session with clinician, Psychoeducation, Recreational therapy.   Physician Treatment Plan for Primary Diagnosis: Schizophrenia [facility]) Long Term Goal(s): Improvement in symptoms so as ready for discharge   Short Term Goals: Ability  to identify changes in lifestyle to reduce recurrence of condition will improve Ability to verbalize  feelings will improve Ability to disclose and discuss suicidal ideas Ability to demonstrate self-control will improve Ability to identify and develop effective coping behaviors will improve  Medication Management: Evaluate patient's response, side effects, and tolerance of medication regimen.  Therapeutic Interventions: 1 to 1 sessions, Unit Group sessions and Medication administration.  Evaluation of Outcomes: Not Progressing  Physician Treatment Plan for Secondary Diagnosis: Active Problems:   Schizoaffective disorder, bipolar type (HCC)  Long Term Goal(s): Improvement in symptoms so as ready for discharge   Short Term Goals: Ability to identify changes in lifestyle to reduce recurrence of condition will improve Ability to verbalize feelings will improve Ability to disclose and discuss suicidal ideas Ability to demonstrate self-control will improve Ability to identify and develop effective coping behaviors will improve     Medication Management: Evaluate patient's response, side effects, and tolerance of medication regimen.  Therapeutic Interventions: 1 to 1 sessions, Unit Group sessions and Medication administration.  Evaluation of Outcomes: Not Progressing   RN Treatment Plan for Primary Diagnosis: Schizophrenia (HCC) Long Term Goal(s): Knowledge of disease and therapeutic regimen to maintain health will improve  Short Term Goals: Ability to remain free from injury will improve, Ability to verbalize frustration and anger appropriately will improve, Ability to demonstrate self-control, Ability to participate in decision making will improve, Ability to verbalize feelings will improve, Ability to disclose and discuss suicidal ideas, Ability to identify and develop effective coping behaviors will improve, and Compliance with prescribed medications will improve  Medication Management: RN will administer medications as ordered by provider, will assess and evaluate patient's response  and provide education to patient for prescribed medication. RN will report any adverse and/or side effects to prescribing provider.  Therapeutic Interventions: 1 on 1 counseling sessions, Psychoeducation, Medication administration, Evaluate responses to treatment, Monitor vital signs and CBGs as ordered, Perform/monitor CIWA, COWS, AIMS and Fall Risk screenings as ordered, Perform wound care treatments as ordered.  Evaluation of Outcomes: Not Progressing   LCSW Treatment Plan for Primary Diagnosis: Schizophrenia (HCC) Long Term Goal(s): Safe transition to appropriate next level of care at discharge, Engage patient in therapeutic group addressing interpersonal concerns.  Short Term Goals: Engage patient in aftercare planning with referrals and resources, Increase social support, Increase ability to appropriately verbalize feelings, Increase emotional regulation, Facilitate acceptance of mental health diagnosis and concerns, Facilitate patient progression through stages of change regarding substance use diagnoses and concerns, Identify triggers associated with mental health/substance abuse issues, and Increase skills for wellness and recovery  Therapeutic Interventions: Assess for all discharge needs, 1 to 1 time with Social worker, Explore available resources and support systems, Assess for adequacy in community support network, Educate family and significant other(s) on suicide prevention, Complete Psychosocial Assessment, Interpersonal group therapy.  Evaluation of Outcomes: Not Progressing   Progress in Treatment: Attending groups: No. Participating in groups: No. Taking medication as prescribed: Yes. Toleration medication: No. Family/Significant other contact made: No, will contact:  CSW will contact if given permission. Patient understands diagnosis: No. Discussing patient identified problems/goals with staff: Yes. Medical problems stabilized or resolved: Yes. Denies suicidal/homicidal  ideation: Yes. Issues/concerns per patient self-inventory: No. Other: None  New problem(s) identified: No, Describe:  None  identified  New Short Term/Long Term Goal(s): elimination of symptoms of psychosis, medication management for mood stabilization; elimination of SI thoughts; development of comprehensive mental wellness plan.     Patient Goals:   To  continue to get well   Discharge Plan or Barriers: CSW will assist with appropriate discharge planning   Reason for Continuation of Hospitalization: Delusions  Mania Medication stabilization  Estimated Length of Stay:1 to 7 days   Last 3 [place] Suicide Severity Risk Score: Flowsheet Row Admission (Current) from 06/19/2024 in [facility] ED from 06/18/2024 in [facility] Emergency Department at [facility] ED from 05/24/2022 in [facility] Emergency Department at [facility]  C-SSRS RISK CATEGORY No Risk No Risk No Risk    Last PHQ 2/9 Scores:    12/21/2022    9:00 AM  Depression screen PHQ 2/9  Decreased Interest 0  Down, Depressed, Hopeless 0  PHQ - 2 Score 0    Scribe for Treatment Team: Rexene LELON Chesley ISRAEL 06/24/2024 2:58 PM

## 2024-06-24 NOTE — Progress Notes (Signed)
°   06/24/24 0900  Psych Admission Type (Psych Patients Only)  Admission Status Voluntary  Psychosocial Assessment  Patient Complaints Worrying  Eye Contact Staring  Facial Expression Animated  Affect Preoccupied  Speech Logical/coherent  Interaction Assertive;Guarded  Motor Activity Slow  Appearance/Hygiene Unremarkable  Behavior Characteristics Guarded;Hypersexual  Mood Labile;Preoccupied  Thought Process  Coherency WDL  Content Paranoia  Delusions Somatic  Perception Hallucinations  Hallucination Auditory  Judgment Impaired  Confusion Mild  Danger to Self  Current suicidal ideation? Denies  Danger to Others  Danger to Others None reported or observed

## 2024-06-24 NOTE — Progress Notes (Signed)
 Patient off site to receive an ultrasound of LLE.

## 2024-06-24 NOTE — Progress Notes (Signed)
 Patient returns from ultrasound of LLE.

## 2024-06-24 NOTE — Group Note (Signed)
 Date:  06/24/2024 Time:  4:15 PM  Group Topic/Focus:  Healthy Communication:   The focus of this group is to discuss communication, barriers to communication, as well as healthy ways to communicate with others.    Participation Level:  Did Not Attend   Valerie [name] 06/24/2024, 4:15 PM

## 2024-06-25 DIAGNOSIS — F250 Schizoaffective disorder, bipolar type: Secondary | ICD-10-CM | POA: Diagnosis not present

## 2024-06-25 NOTE — Plan of Care (Signed)
°  Problem: Self-Concept: Goal: Ability to identify factors that promote anxiety will improve Outcome: Not Progressing Goal: Level of anxiety will decrease Outcome: Not Progressing

## 2024-06-25 NOTE — Progress Notes (Signed)
 [facility] Physician Opinion Progress Note for Medication Administration to Non-consenting Patients (For Involuntarily Committed Patients)  Patient: Valerie [name] Date of Birth: [id number] MRN: [id number]  Reason for the Medication: The patient, without the benefit of the specific treatment measure, is incapable of participating in any available treatment plan that will give the patient a realistic opportunity of improving the patient's condition.  Consideration of Side Effects: Consideration of the side effects related to the medication plan has been given.  Rationale for Medication Administration: very psychotic    Millie JONELLE Manners, MD 06/25/2024  12:04 PM   This documentation is good for (7) seven days from the date of the MD signature. New documentation must be completed every seven (7) days with detailed justification in the medical record if the patient requires continued non-emergent administration of psychotropic medications.
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
## 2024-06-25 NOTE — Group Note (Signed)
 Date:  06/25/2024 Time:  10:02 PM  Group Topic/Focus:  Wrap-Up Group:   The focus of this group is to help patients review their daily goal of treatment and discuss progress on daily workbooks.    Participation Level:  Did Not Attend  Participation Quality:     Affect:     Cognitive:     Insight: None  Engagement in Group:  None  Modes of Intervention:     Additional Comments:    Valerie [name] CHRISTELLA Bunker 06/25/2024, 10:02 PM

## 2024-06-25 NOTE — Group Note (Signed)
 LCSW Group Therapy Note  Group Date: 06/25/2024 Start Time: 1300 End Time: 1345   Type of Therapy and Topic:  Group Therapy - Healthy vs Unhealthy Coping Skills  Participation Level:  Active   Description of Group The focus of this group was to determine what unhealthy coping techniques typically are used by group members and what healthy coping techniques would be helpful in coping with various problems. Patients were guided in becoming aware of the differences between healthy and unhealthy coping techniques. Patients were asked to identify 2-3 healthy coping skills they would like to learn to use more effectively.  Therapeutic Goals Patients learned that coping is what human beings do all day long to deal with various situations in their lives Patients defined and discussed healthy vs unhealthy coping techniques Patients identified their preferred coping techniques and identified whether these were healthy or unhealthy Patients determined 2-3 healthy coping skills they would like to become more familiar with and use more often. Patients provided support and ideas to each other   Summary of Patient Progress:  During group,  Patient was open and able to understand the discussion topic.  Patient proved open to input from peers and feedback from CSW. Patient participated throughout the entire session.   Therapeutic Modalities Cognitive Behavioral Therapy Motivational Interviewing  Aldo CHRISTELLA Niece, [place] 06/25/2024  3:49 PM

## 2024-06-25 NOTE — Progress Notes (Signed)
 D- Patient alert and oriented x 3. Pt presents with a flat mood and affect. Pt denies SI, HI, AVH, but endorses leg pain 10/10. Q  A- Scheduled and PRN medications administered to patient, per MD orders. Support and encouragement provided.  Routine safety checks conducted every 15 minutes.  Patient informed to notify staff with problems or concerns.  R- No adverse drug reactions noted. Patient contracts for safety at this time. Patient compliant with medications and treatment plan. Patient receptive, calm, and cooperative. Patient interacts well with others on the unit.  Patient remains safe at this time.    Betzaira Mentel S.,RN

## 2024-06-25 NOTE — [facility] Group Notes (Signed)
 [facility] Group Notes:  (Nursing/MHT/Case Management/Adjunct)  Date:  06/25/2024  Time:  3:59 PM  Type of Therapy:  Group Therapy  Participation Level:  Active  Participation Quality:  Appropriate  Affect:  Appropriate  Cognitive:  Alert  Insight:  Appropriate  Engagement in Group:  Engaged  Modes of Intervention:  Activity  Summary of Progress/Problems: Patient was pleasant with full participation in this group.  Kashonda Sarkisyan 06/25/2024, 3:59 PM

## 2024-06-25 NOTE — Plan of Care (Signed)
  Problem: Activity: Goal: Sleeping patterns will improve Outcome: Progressing   

## 2024-06-25 NOTE — Progress Notes (Signed)
 Writer  noted pt hoarding Voltaren  cream in room during room check. Reminder given to pt and multiple cups disposed. Pt offered to put next dose on with writer present in pt room. Pt declined and states,  I will stay in the dayroom and walked away.  Rhylan Kagel S.R, RN

## 2024-06-25 NOTE — Group Note (Signed)
 Date:  06/25/2024 Time:  10:22 AM  Group Topic/Focus:  Movement Therapy, Morning Stretch with Ellena Kamen.    Participation Level:  Did Not Attend      [name] 06/25/2024, 10:22 AM

## 2024-06-25 NOTE — Progress Notes (Signed)
°   06/24/24 2251  Psych Admission Type (Psych Patients Only)  Admission Status Voluntary  Psychosocial Assessment  Patient Complaints Worrying;Anxiety;Restlessness;Suspiciousness  Eye Contact Staring  Facial Expression Animated  Affect Preoccupied  Speech Logical/coherent;Tangential  Interaction Assertive;Guarded;Intrusive  Motor Activity Slow  Appearance/Hygiene Unremarkable  Behavior Characteristics Anxious;Guarded;Hypersexual;Impulsive  Mood Labile;Preoccupied;Suspicious  Thought Process  Coherency Disorganized  Content Paranoia  Delusions Somatic  Perception Hallucinations  Hallucination Auditory  Judgment Impaired  Confusion Mild  Danger to Self  Current suicidal ideation? Denies  Danger to Others  Danger to Others None reported or observed    Estimated Sleeping Duration (Last 24 Hours): 6.50-8.50 hours

## 2024-06-25 NOTE — Progress Notes (Signed)
°   06/25/24 1000  Psych Admission Type (Psych Patients Only)  Admission Status Voluntary  Psychosocial Assessment  Patient Complaints Suspiciousness;Worrying;Restlessness  Eye Contact Staring  Facial Expression Animated  Affect Preoccupied  Speech Logical/coherent  Interaction Assertive;Guarded  Motor Activity Slow  Appearance/Hygiene Unremarkable  Behavior Characteristics Guarded;Hypersexual;Restless;Anxious  Mood Labile;Preoccupied  Thought Process  Coherency WDL  Content Paranoia  Delusions Somatic  Perception Hallucinations  Hallucination Auditory  Judgment Impaired  Confusion Mild  Danger to Self  Current suicidal ideation? Denies  Danger to Others  Danger to Others None reported or observed

## 2024-06-25 NOTE — Plan of Care (Signed)
°  Problem: Education: Goal: Mental status will improve Outcome: Not Progressing   Problem: Activity: Goal: Interest or engagement in activities will improve Outcome: Not Progressing   Problem: Education: Goal: Ability to state activities that reduce stress will improve Outcome: Not Progressing

## 2024-06-26 MED ORDER — OLANZAPINE 10 MG IM SOLR
10.0000 mg | Freq: Once | INTRAMUSCULAR | Status: DC
  Administered 2024-06-26: 13:00:00 10 mg via INTRAMUSCULAR
  Filled 2024-06-26: qty 10

## 2024-06-26 MED ORDER — OLANZAPINE 5 MG PO TABS: 10.0000 mg | ORAL_TABLET | Freq: Every day | ORAL | Status: DC

## 2024-06-26 MED ORDER — OLANZAPINE 10 MG IM SOLR: 10.0000 mg | Freq: Every day | INTRAMUSCULAR | Status: DC

## 2024-06-26 MED ORDER — CLOZAPINE 25 MG PO TABS
50.0000 mg | ORAL_TABLET | Freq: Two times a day (BID) | ORAL | Status: DC
  Administered 2024-06-26 – 2024-06-28 (×3): 50 mg via ORAL
  Filled 2024-06-26 (×5): qty 2

## 2024-06-26 MED ORDER — OLANZAPINE 10 MG IM SOLR
10.0000 mg | Freq: Two times a day (BID) | INTRAMUSCULAR | Status: DC
  Administered 2024-06-27: 23:00:00 10 mg via INTRAMUSCULAR
  Filled 2024-06-26 (×2): qty 10

## 2024-06-26 NOTE — Progress Notes (Signed)
 D- Patient alert and oriented x 2. Pt presents with a demanding mood and affect with delusion thoughts of being related to providers in the hospital. Pt denies SI, HI, AVH, but endorses anxiety 3/10, depression 3/10, and pain 10/10. Pt hyper-focused on getting a Demerol injection. Pt at the nurses station numerous time with complaint of needing Demerol and wanting staff to call ambulance. Pt singing in the dayroom with moments of yelling.   A- Scheduled/ PRN medications administered to patient, per MD orders. Pt pulse elevated after dinner. pt refused PRN anxiety medication. Pt notably restless and pacing. Pt denies anxiety. Support and encouragement provided.  Routine safety checks conducted every 15 minutes.  Patient informed to notify staff with problems or concerns.  R- No adverse drug reactions noted. Patient contracts for safety at this time. Patient compliant with medications and treatment plan. Patient receptive, calm, and cooperative. Patient did not interact much with others on the unit.  Patient remains safe at this time.    Bethannie Iglehart S.,RN

## 2024-06-26 NOTE — Plan of Care (Signed)
  Problem: Activity: Goal: Sleeping patterns will improve Outcome: Progressing   

## 2024-06-26 NOTE — Group Note (Signed)
 Date:  06/26/2024 Time:  9:57 PM  Group Topic/Focus:  Wrap-Up Group:   The focus of this group is to help patients review their daily goal of treatment and discuss progress on daily workbooks.    Participation Level:  Minimal  Participation Quality:  Attentive  Affect:  Appropriate  Cognitive:  Alert  Insight: Improving  Engagement in Group:  Improving  Modes of Intervention:  Discussion  Additional Comments:    Tommas CHRISTELLA Bunker 06/26/2024, 9:57 PM

## 2024-06-26 NOTE — Progress Notes (Signed)
 Pt VS reassessed. Pt asleep in room.   Tage Feggins S.,RN

## 2024-06-26 NOTE — Group Note (Signed)
 Recreation Therapy Group Note   Group Topic:Emotion Expression  Group Date: 06/26/2024 Start Time: 1410 End Time: 1500 Facilitators: Celestia Jeoffrey BRAVO, LRT, CTRS Location: Craft Room  Group Description: Positivity Collage. LRT and patients discussed the importance of having a positive mindset and being happy. Patients received magazines, safety scissors, a glue stick and a piece of paper. Pts were encouraged to find images or words in the magazines that showed happiness or positivity to them. Pt shared their collage with the group once they were finished. LRT and pts discussed how it can be difficult to always have a positive mindset, especially when they have mental health challenges.   Goal Area(s) Addressed:  Pt will identify things associate with positivity. Pt will reduce negative thinking. Pt will identify a new coping skill of thinking positive thoughts.    Affect/Mood: N/A   Participation Level: Did not attend    Clinical Observations/Individualized Feedback: Patient did not attend.  Plan: Continue to engage patient in RT group sessions 2-3x/week.   Jeoffrey BRAVO Celestia, LRT, CTRS 06/26/2024 4:47 PM

## 2024-06-26 NOTE — Group Note (Signed)
 Physical/Occupational Therapy Group Note  Group Topic: Yoga  Group Date: 06/26/2024 Start Time: 1500 End Time: 1530 Facilitators: Samit Sylve, Alm Hamilton, PT   Group Description: Group participated with series of yoga poses, designed to emphasize functional sitting balance, core stability, generalized flexibility and overall posture.  Incorporated deep breathing techniques with poses, working to promote relaxation, mindfulness and focus with targeted activities.  Discussed benefits of yoga in improving mood and self-esteem, reducing stress and anxiety, and promoting functional strength, balance and core stability for each participant.  Discussed ways to integrate into each participants daily routine.  Provided handout with written and pictorial descriptions of included yoga movements to be utilized as appropriate outside of group time.  Therapeutic Goal(s):  Demonstrate safe ability to participate with yoga poses during group activity. Identify one benefit of participation with yoga poses as part of each participants exercise/movement routine. Identify 1-2 individual poses that participant feels most beneficial to his/her needs and that he/she can easily replicate outside of group.  Individual Participation: Did not attend  Participation Level:   Participation Quality:   Behavior:   Speech/Thought Process:   Affect/Mood:   Insight:   Judgement:   Modes of Intervention:   Patient Response to Interventions:    Plan: Continue to engage patient in PT/OT groups 1 - 2x/week.  CHARM Hamilton Bertin PT, DPT 06/26/2024, 5:17 PM
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
## 2024-06-26 NOTE — Group Note (Signed)
 Date:  06/26/2024 Time:  12:11 PM  Group Topic/Focus:   Coping With Mental Health Crisis:   The purpose of this group is to help patients identify strategies for coping with mental health crisis.  Group discusses possible causes of crisis and ways to manage them effectively. Personal Choices and Values:   The focus of this group is to help patients assess and explore the importance of values in their lives, how their values affect their decisions, how they express their values and what opposes their expression.  The patients had the opportunity to create vision boards and set goals for the upcoming new year. We discussed new goals and dreams, along with realistic time frames for accomplishing them. This activity encouraged reflection, motivation, and hope. It also served as a print production planner experience and promoted positivity and a more optimistic outlook on life.    Participation Level:  Did Not Attend  Participation Quality:    Affect:    Cognitive:    Insight:   Engagement in Group:    Modes of Intervention:    Additional Comments:    Dene Nazir L Robel Wuertz 06/26/2024, 12:11 PM

## 2024-06-26 NOTE — Plan of Care (Signed)
  Problem: Education: Goal: Emotional status will improve Outcome: Not Progressing Goal: Mental status will improve Outcome: Not Progressing Goal: Verbalization of understanding the information provided will improve Outcome: Not Progressing   Problem: Coping: Goal: Ability to verbalize frustrations and anger appropriately will improve Outcome: Not Progressing

## 2024-06-26 NOTE — Progress Notes (Signed)
°   06/25/24 2348  Psych Admission Type (Psych Patients Only)  Admission Status Voluntary  Psychosocial Assessment  Patient Complaints Worrying;Suspiciousness  Eye Contact Staring  Facial Expression Animated  Affect Preoccupied  Speech Logical/coherent  Interaction Guarded;Assertive  Motor Activity Slow  Appearance/Hygiene Unremarkable  Behavior Characteristics Guarded;Impulsive  Mood Labile;Preoccupied  Thought Process  Coherency WDL  Content Paranoia  Delusions Somatic  Perception Hallucinations  Hallucination Auditory  Judgment Impaired  Confusion Mild  Danger to Self  Current suicidal ideation? Denies  Danger to Others  Danger to Others None reported or observed   Estimated Sleeping Duration (Last 24 Hours): 6.25-8.25 hours

## 2024-06-27 DIAGNOSIS — F250 Schizoaffective disorder, bipolar type: Secondary | ICD-10-CM | POA: Diagnosis not present

## 2024-06-27 MED ORDER — ZOLPIDEM TARTRATE 5 MG PO TABS
ORAL_TABLET | ORAL | Status: AC
  Filled 2024-06-27: qty 1

## 2024-06-27 MED ORDER — DIVALPROEX SODIUM ER 500 MG PO TB24
500.0000 mg | ORAL_TABLET | Freq: Every day | ORAL | Status: DC
  Filled 2024-06-27 (×8): qty 1

## 2024-06-27 MED ORDER — ZOLPIDEM TARTRATE 5 MG PO TABS
10.0000 mg | ORAL_TABLET | Freq: Every day | ORAL | Status: AC
  Administered 2024-06-27 – 2024-08-18 (×47): 10 mg via ORAL
  Filled 2024-06-27 (×41): qty 2

## 2024-06-27 NOTE — Progress Notes (Signed)
 Pt refused scheduled Depakote  ER 500 mg and Clozaril  50 mg po dt Clozaril  refusal, Zyprexa  10 mg IM administered per MD order. Darice, RN present and assisted for support. Tol well. No s/s of acute distress noted. No c/o pain/discomfort noted at injection site.

## 2024-06-27 NOTE — Group Note (Signed)
 Date:  06/27/2024 Time:  4:02 PM  Group Topic/Focus:  Healthy Communication:   The focus of this group is to discuss communication, barriers to communication, as well as healthy ways to communicate with others. Making Healthy Choices:   The focus of this group is to help patients identify negative/unhealthy choices they were using prior to admission and identify positive/healthier coping strategies to replace them upon discharge.  I provided structured worksheets designed to promote cognitive engagement, social interaction, and conversation. Worksheets included activities that encouraged critical thinking and verbal expression, such as This or That (Would You Rather) questions and prompts focused on favorite memories. The group reviewed the worksheet together, with questions read aloud to support comprehension and inclusion.  In addition to the worksheet activities, the group participated in Guess That Song, which encouraged memory recall, attention, and peer interaction. An acts of kindness discussion was also facilitated, allowing participants to reflect on positive behaviors and share personal experiences.  Participants demonstrated engagement through verbal responses, shared memories, and interaction with peers. The group setting promoted socialization, reminiscence, and positive affect. Modifications were provided as needed, including verbal prompting and allowing responses to be given verbally rather than written.  The session supported cognitive stimulation, emotional expression, and group cohesion in a supportive environment.    Participation Level:  Did Not Attend  Participation Quality:    Affect:    Cognitive:    Insight:   Engagement in Group:    Modes of Intervention:    Additional Comments:    Haseeb Fiallos L Makayla Lanter 06/27/2024, 4:02 PM

## 2024-06-27 NOTE — Plan of Care (Signed)
  Problem: Coping: Goal: Ability to verbalize frustrations and anger appropriately will improve Outcome: Not Progressing Goal: Ability to demonstrate self-control will improve Outcome: Not Progressing   Problem: Safety: Goal: Periods of time without injury will increase Outcome: Progressing   

## 2024-06-27 NOTE — Group Note (Signed)
 Date:  06/27/2024 Time:  11:18 PM  Group Topic/Focus:  Wrap-Up Group:   The focus of this group is to help patients review their daily goal of treatment and discuss progress on daily workbooks.    Participation Level:  Minimal  Participation Quality:  Redirectable  Affect:  Blunted  Cognitive:  Confused  Insight: Lacking  Engagement in Group:  Lacking and Off Topic  Modes of Intervention:  Discussion  Additional Comments:    Valerie [name] CHRISTELLA Bunker 06/27/2024, 11:18 PM

## 2024-06-27 NOTE — Progress Notes (Signed)
°   06/26/24 2025  Psych Admission Type (Psych Patients Only)  Admission Status Voluntary  Psychosocial Assessment  Patient Complaints Anxiety;Suspiciousness;Worrying  Eye Contact Staring  Facial Expression Animated  Affect Inconsistent with thought content;Preoccupied  Speech Unremarkable  Interaction Demanding;Needy;Cautious  Motor Activity Slow  Appearance/Hygiene Unremarkable  Mood Preoccupied  Thought Process  Coherency Disorganized  Content Paranoia;Delusions  Delusions Grandeur;Somatic  Perception Hallucinations  Hallucination Auditory  Judgment Poor  Confusion Moderate  Danger to Self  Current suicidal ideation? Denies  Agreement Not to Harm Self Yes  Description of Agreement Verbal  Danger to Others  Danger to Others None reported or observed

## 2024-06-27 NOTE — Group Note (Signed)
 Recreation Therapy Group Note   Group Topic:General Recreation  Group Date: 06/27/2024 Start Time: 1405 End Time: 1450 Facilitators: Celestia Jeoffrey BRAVO, LRT, CTRS Location: Courtyard  Group Description: [employer]. LRT and patients played games of basketball, drew with chalk, and played corn hole while outside in the courtyard while getting fresh air and sunlight. Music was being played in the background. LRT and peers conversed about different games they have played before, what they do in their free time and anything else that is on their minds. LRT encouraged pts to drink water after being outside, sweating and getting their heart rate up.  Goal Area(s) Addressed: Patient will build on frustration tolerance skills. Patients will partake in a competitive play game with peers. Patients will gain knowledge of new leisure interest/hobby.    Affect/Mood: N/A   Participation Level: Did not attend    Clinical Observations/Individualized Feedback: Patient did not attend group.   Plan: Continue to engage patient in RT group sessions 2-3x/week.   Jeoffrey BRAVO Celestia, LRT, CTRS 06/27/2024 4:39 PM

## 2024-06-27 NOTE — Progress Notes (Signed)
°   06/27/24 1800  Psych Admission Type (Psych Patients Only)  Admission Status Voluntary  Psychosocial Assessment  Patient Complaints Suspiciousness;Worrying;Restlessness;Anxiety  Eye Contact Darting  Facial Expression Animated  Affect Blunted  Speech Abusive;Tangential  Interaction Defensive;Demanding;Dominating;Guarded;Intrusive;Sarcastic  Motor Activity Slow  Appearance/Hygiene Unremarkable  Behavior Characteristics Intrusive;Restless;Anxious  Mood Preoccupied  Thought Process  Coherency Disorganized  Content Paranoia  Delusions Somatic;Grandeur;Paranoid;Religious  Perception Hallucinations  Hallucination Auditory  Judgment Impaired  Confusion Mild  Danger to Self  Current suicidal ideation? Denies  Danger to Others  Danger to Others None reported or observed

## 2024-06-27 NOTE — Group Note (Signed)
 Date:  06/27/2024 Time:  11:39 AM  Group Topic/Focus:  Goals Group:   The focus of this group is to help patients establish daily goals to achieve during treatment and discuss how the patient can incorporate goal setting into their daily lives to aide in recovery. Healthy Communication:   The focus of this group is to discuss communication, barriers to communication, as well as healthy ways to communicate with others.    Participation Level:  Minimal  Participation Quality:  Appropriate  Affect:  Appropriate  Cognitive:  Appropriate  Insight: Limited  Engagement in Group:  Limited  Modes of Intervention:  Activity and Discussion  Additional Comments:    Alyn Jurney L Hayato Guaman 06/27/2024, 11:39 AM

## 2024-06-27 NOTE — Plan of Care (Signed)
   Problem: Education: Goal: Emotional status will improve Outcome: Not Progressing Goal: Mental status will improve Outcome: Not Progressing

## 2024-06-27 NOTE — Group Note (Signed)
 LCSW Group Therapy Note   Group Date: 06/27/2024 Start Time: 1315 End Time: 1330   Type of Therapy and Topic:  Group Therapy: Boundaries  Participation Level:  None  Description of Group: This group will address the use of boundaries in their personal lives. Patients will explore why boundaries are important, the difference between healthy and unhealthy boundaries, and negative and postive outcomes of different boundaries and will look at how boundaries can be crossed.  Patients will be encouraged to identify current boundaries in their own lives and identify what kind of boundary is being set. Facilitators will guide patients in utilizing problem-solving interventions to address and correct types boundaries being used and to address when no boundary is being used. Understanding and applying boundaries will be explored and addressed for obtaining and maintaining a balanced life. Patients will be encouraged to explore ways to assertively make their boundaries and needs known to significant others in their lives, using other group members and facilitator for role play, support, and feedback.  Therapeutic Goals:  1.  Patient will identify areas in their life where setting clear boundaries could be  used to improve their life.  2.  Patient will identify signs/triggers that a boundary is not being respected. 3.  Patient will identify two ways to set boundaries in order to achieve balance in  their lives: 4.  Patient will demonstrate ability to communicate their needs and set boundaries  through discussion and/or role plays  Summary of Patient Progress:  X  Therapeutic Modalities:   Cognitive Behavioral Therapy Solution-Focused Therapy  Lum JONETTA Croft, LCSWA 07/18/2024  2:36 PM
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
## 2024-06-28 DIAGNOSIS — F250 Schizoaffective disorder, bipolar type: Secondary | ICD-10-CM | POA: Diagnosis not present

## 2024-06-28 LAB — HEMOGLOBIN A1C
Hgb A1c MFr Bld: 5.9 % — ABNORMAL HIGH (ref 4.8–5.6)
Mean Plasma Glucose: 122.63 mg/dL

## 2024-06-28 LAB — LIPID PANEL
Cholesterol: 156 mg/dL (ref 0–200)
HDL: 70 mg/dL (ref >40)
LDL Cholesterol: 69 mg/dL (ref 0–99)
Total CHOL/HDL Ratio: 2.2 ratio
Triglycerides: 89 mg/dL (ref <150)
VLDL: 18 mg/dL (ref 0–40)

## 2024-06-28 MED ORDER — OLANZAPINE 10 MG IM SOLR
10.0000 mg | Freq: Two times a day (BID) | INTRAMUSCULAR | Status: DC
  Filled 2024-06-28: qty 10

## 2024-06-28 MED ORDER — CLOZAPINE 25 MG PO TABS
75.0000 mg | ORAL_TABLET | Freq: Two times a day (BID) | ORAL | Status: DC
  Administered 2024-06-28 – 2024-07-03 (×9): 75 mg via ORAL
  Filled 2024-06-28 (×11): qty 3

## 2024-06-28 NOTE — Plan of Care (Signed)
  Problem: Education: Goal: Ability to state activities that reduce stress will improve Outcome: Progressing   Problem: Self-Concept: Goal: Ability to identify factors that promote anxiety will improve Outcome: Progressing Goal: Level of anxiety will decrease Outcome: Progressing Goal: Ability to modify response to factors that promote anxiety will improve Outcome: Progressing   

## 2024-06-28 NOTE — Progress Notes (Signed)
 SI/HI/AVH: denies all  Behavior/Mood: anxious/restless    Interaction/Group attendance: blunted/ 2 of 3 groups   Medication/PRNs: semi-compliant but on forced med order/ no PRNs   Pain: 2/10 gen  Other: patient had a high broset score on 12.16 night shift

## 2024-06-28 NOTE — Plan of Care (Signed)
°  Problem: Self-Concept: Goal: Ability to modify response to factors that promote anxiety will improve Outcome: Progressing   Problem: Activity: Goal: Will verbalize the importance of balancing activity with adequate rest periods Outcome: Progressing

## 2024-06-28 NOTE — Group Note (Signed)
 Date:  06/28/2024 Time:  10:55 AM  Group Topic/Focus:  Healthy Communication:   The focus of this group is to discuss communication, barriers to communication, as well as healthy ways to communicate with others.    Participation Level:  Did Not Attend  [name] 06/28/2024, 10:55 AM

## 2024-06-28 NOTE — Group Note (Signed)
 Date:  06/28/2024 Time:  8:56 PM  Group Topic/Focus:  Wrap-Up Group:   The focus of this group is to help patients review their daily goal of treatment and discuss progress on daily workbooks.    Participation Level:  Minimal  Participation Quality:  Intrusive and Redirectable  Affect:  Flat and Irritable  Cognitive:  Disorganized  Insight: Lacking  Engagement in Group:  Lacking  Modes of Intervention:  Discussion  Additional Comments:    Valerie [name] 06/28/2024, 8:56 PM

## 2024-06-28 NOTE — Progress Notes (Signed)
 Pt smeared feces on bathroom floor and toilet seat. Pt then threatened to throw a large pitcher of water at staff.  Able to redirect.  Pt reports obtaining  the pitcher and cups from the trash can. All items removed for safety. Area cleansed. Environmental services notified and pending at this time.

## 2024-06-28 NOTE — Progress Notes (Signed)
 [facility] MD Progress Note  06/28/2024 9:08 PM Ruthanna Macchia  MRN:  [id number]   Valerie [name] is a 63 year old female presenting voluntary to [facility] requesting a psychiatric evaluation for worsening psychosis. Patient has history of schizophrenia. Patient denied SI, HI, psychosis and alcohol/drug usage. Patient resides at [facility]. Patient reports people are coming by my door talking loud, yelling and screaming and coming in my room with keys. Patient reports having multiple medical Afibs and blood clots due to this. Patient reports people are doing things in the dining room. When asked if she called EMS, patient stated I called EMS, I stripped searched over the phone and called the authorities. Patient is admitted to [facility] unit with Q15 min safety monitoring. Multidisciplinary team approach is offered. Medication management; group/milieu therapy is offered.  Subjective:  Chart reviewed, case discussed in multidisciplinary meeting, patient seen during rounds.   Patient is noted to be walking in the hallway.  She remains intrusive with poor personal boundaries.  She continues to blame that nurses are giving her Zyprexa  overdose or intoxication.  Provider explained to her again that it is nonemergent forced medication approved by 2 physicians when she refuses Clazuril she will receive IM Zyprexa .  Patient continued to decline the need for any medications.  She is not endorsing SI/HI/plan.  She remains hypomanic, with tangential and grandiose delusions   Able to understand medical problem-NO  Able to understand proposed treatment -NO   Able to understand alternative to proposed treatment (if any) -NO   Able to understand option of refusing proposed treatment (including withholding or withdrawing proposed treatment)-NO   Able to appreciate reasonably foreseeable consequences of accepting proposed treatment -NO  After thorough assessment, it is our clinical  opinion-  Capacity is not competency. Competency is determined by legal system and judge. Capacity can vary from time to time depending on the mental status of the patient.  Past Psychiatric History: see h&P Family History:  Family History  Problem Relation Age of Onset   Colon cancer Neg Hx    Celiac disease Neg Hx    Inflammatory bowel disease Neg Hx    Social History:  Social History   Substance and Sexual Activity  Alcohol Use Never     Social History   Substance and Sexual Activity  Drug Use Never    Social History   Socioeconomic History   Marital status: Single    Spouse name: Not on file   Number of children: Not on file   Years of education: Not on file   Highest education level: Not on file  Occupational History   Not on file  Tobacco Use   Smoking status: Never   Smokeless tobacco: Never  Substance and Sexual Activity   Alcohol use: Never   Drug use: Never   Sexual activity: Not Currently    Birth control/protection: Pill  Other Topics Concern   Not on file  Social History Narrative   Not on file   Social Drivers of Health   Tobacco Use: Low Risk (06/19/2024)   Patient History    Smoking Tobacco Use: Never    Smokeless Tobacco Use: Never    Passive Exposure: Not on file  Financial Resource Strain: Not on file  Food Insecurity: Unknown (06/19/2024)   Epic    Worried About Programme Researcher, Broadcasting/film/video in the Last Year: Never true    [employer] of Food in the Last Year: Patient declined  Transportation  Needs: No Transportation Needs (06/19/2024)   Epic    Lack of Transportation (Medical): No    Lack of Transportation (Non-Medical): No  Physical Activity: Not on file  Stress: Not on file  Social Connections: Not on file  Depression (PHQ2-9): Low Risk (12/21/2022)   Depression (PHQ2-9)    PHQ-2 Score: 0  Alcohol Screen: Low Risk (06/19/2024)   Alcohol Screen    Last Alcohol Screening Score (AUDIT): 0  Housing: Low Risk (06/19/2024)   Epic    Unable to Pay  for Housing in the Last Year: No    Number of Times Moved in the Last Year: 0    Homeless in the Last Year: No  Utilities: Not At Risk (06/19/2024)   Epic    Threatened with loss of utilities: No  Health Literacy: Not on file   Past Medical History:  Past Medical History:  Diagnosis Date   Chronic lower extremity pain (2ry area of Pain) (Right) 12/21/2022   Chronic pain syndrome 12/20/2022   DDD (degenerative disc disease), 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 Tobie: Pancolonic diverticulosis, tortuous colon, next colonoscopy 2024   TONSILLECTOMY      Current Medications: Current Facility-Administered Medications  Medication Dose Route Frequency Provider Last Rate Last Admin   acetaminophen  (TYLENOL ) tablet 650 mg  650 mg Oral Q6H PRN Onuoha, Chinwendu V, NP   650 mg at 06/28/24 2102   alum & mag hydroxide-simeth (MAALOX/MYLANTA) 200-200-20 MG/5ML suspension 30 mL  30 mL Oral Q4H PRN Bobbitt, Shalon E, NP       amLODipine  (NORVASC ) tablet 5 mg  5 mg Oral Daily Brandee Markin, MD   5 mg at 06/28/24 0955   aspirin  EC tablet 81 mg  81 mg Oral Daily Bobbitt, Shalon E, NP   81 mg at 06/28/24 0955   bisacodyl  (DULCOLAX) EC tablet 10 mg  10 mg Oral Daily PRN Kern Gingras, MD       cholecalciferol  (VITAMIN D3) 25 MCG (1000 UNIT) tablet 1,000 Units  1,000 Units Oral Weekly Nickolas Chalfin, MD   1,000 Units at 06/27/24 1251   cloZAPine  (CLOZARIL ) tablet 50 mg  50 mg Oral BID Madaram, Kondal R, MD   50 mg at 06/28/24 9046   Or   OLANZapine  (ZYPREXA ) injection 10 mg  10 mg Intramuscular BID Madaram, Kondal R, MD   10 mg at 06/27/24 2243   diclofenac  Sodium (VOLTAREN ) 1 % topical gel 4 g  4 g Topical QID Donnelly Mellow, MD   4 g at 06/28/24 1706   divalproex  (DEPAKOTE  ER) 24 hr tablet 500 mg  500 mg Oral Daily Tiron Suski, MD        DULoxetine  (CYMBALTA ) DR capsule 40 mg  40 mg Oral Daily Yu Peggs, MD   40 mg at 06/28/24 9046   famotidine  (PEPCID ) tablet 20 mg  20 mg Oral Daily Darlis Wragg, MD   20 mg at 06/28/24 9044   fluticasone  (FLONASE ) 50 MCG/ACT nasal spray 1 spray  1 spray Each Nare Daily Bobbitt, Shalon E, NP       gabapentin  (NEURONTIN ) capsule 300 mg  300 mg Oral TID Bobbitt, Shalon E, NP   300 mg at 06/28/24 1650   hydrochlorothiazide  (HYDRODIURIL ) tablet 25 mg  25 mg Oral Daily Juanette Urizar, MD   25 mg  at 06/28/24 0956   hydrOXYzine  (ATARAX ) tablet 25 mg  25 mg Oral TID PRN Bobbitt, Shalon E, NP   25 mg at 06/26/24 0731   linagliptin  (TRADJENTA ) tablet 5 mg  5 mg Oral Daily Bobbitt, Shalon E, NP   5 mg at 06/28/24 9044   lisinopril  (ZESTRIL ) tablet 30 mg  30 mg Oral Daily Bobbitt, Shalon E, NP   30 mg at 06/28/24 9046   magnesium  hydroxide (MILK OF MAGNESIA) suspension 30 mL  30 mL Oral Daily PRN Bobbitt, Shalon E, NP       metFORMIN  (GLUCOPHAGE ) tablet 1,000 mg  1,000 mg Oral Q supper Bobbitt, Shalon E, NP   1,000 mg at 06/28/24 1649   multivitamin with minerals tablet 1 tablet  1 tablet Oral Daily Bobbitt, Shalon E, NP   1 tablet at 06/28/24 0955   naproxen  (NAPROSYN ) tablet 375 mg  375 mg Oral TID WC Orlan Aversa, MD   375 mg at 06/28/24 1649   OLANZapine  (ZYPREXA ) injection 5 mg  5 mg Intramuscular TID PRN Bobbitt, Shalon E, NP   5 mg at 06/22/24 2259   OLANZapine  zydis (ZYPREXA ) disintegrating tablet 5 mg  5 mg Oral TID PRN Bobbitt, Shalon E, NP   5 mg at 06/26/24 1024   ondansetron  (ZOFRAN ) tablet 4 mg  4 mg Oral Q8H PRN Bobbitt, Shalon E, NP       potassium chloride  SA (KLOR-CON  M) CR tablet 20 mEq  20 mEq Oral BID Bobbitt, Shalon E, NP   20 mEq at 06/28/24 9046   propranolol  (INDERAL ) tablet 40 mg  40 mg Oral BID Bobbitt, Shalon E, NP   40 mg at 06/28/24 9044   rosuvastatin  (CRESTOR ) tablet 5 mg  5 mg Oral Daily Bobbitt, Shalon E, NP   5 mg at 06/28/24 9043   traZODone  (DESYREL ) tablet 50  mg  50 mg Oral QHS PRN Bobbitt, Shalon E, NP       zolpidem  (AMBIEN ) tablet 10 mg  10 mg Oral QHS Verley Pariseau, MD   10 mg at 06/27/24 2241    Lab Results:  Results for orders placed or performed during the hospital encounter of 06/19/24 (from the past 48 hours)  Hemoglobin A1c     Status: Abnormal   Collection Time: 06/28/24  8:57 AM  Result Value Ref Range   Hgb A1c MFr Bld 5.9 (H) 4.8 - 5.6 %    Comment: (NOTE) Diagnosis of Diabetes The following HbA1c ranges recommended by the American Diabetes Association (ADA) may be used as an aid in the diagnosis of diabetes mellitus.  Hemoglobin             Suggested A1C NGSP%              Diagnosis  <5.7                   Non Diabetic  5.7-6.4                Pre-Diabetic  >6.4                   Diabetic  <7.0                   Glycemic control for                       adults with diabetes.     Mean Plasma Glucose 122.63 mg/dL    Comment: Performed at [facility]  [facility], [address], [place], [place] [address]  Lipid panel     Status: None   Collection Time: 06/28/24  8:57 AM  Result Value Ref Range   Cholesterol 156 0 - 200 mg/dL    Comment:        ATP III CLASSIFICATION:  <200     mg/dL   Desirable  799-760  mg/dL   Borderline High  >=759    mg/dL   High           Triglycerides 89 <150 mg/dL   HDL 70 >59 mg/dL   Total CHOL/HDL Ratio 2.2 RATIO   VLDL 18 0 - 40 mg/dL   LDL Cholesterol 69 0 - 99 mg/dL    Comment:        Total Cholesterol/HDL:CHD Risk Coronary Heart Disease Risk Table                     Men   Women  1/2 Average Risk   3.4   3.3  Average Risk       5.0   4.4  2 X Average Risk   9.6   7.1  3 X Average Risk  23.4   11.0        Use the calculated Patient Ratio above and the CHD Risk Table to determine the patient's CHD Risk.        ATP III CLASSIFICATION (LDL):  <100     mg/dL   Optimal  899-870  mg/dL   Near or Above                    Optimal  130-159  mg/dL   Borderline  839-810  mg/dL    High  >809     mg/dL   Very High Performed at [facility], [address] [place], [place] [address]       Blood Alcohol level:  Lab Results  Component Value Date   [facility] <15 06/18/2024    Metabolic Disorder Labs: Lab Results  Component Value Date   HGBA1C 5.9 (H) 06/28/2024   MPG 122.63 06/28/2024   No results found for: PROLACTIN Lab Results  Component Value Date   CHOL 156 06/28/2024   TRIG 89 06/28/2024   HDL 70 06/28/2024   CHOLHDL 2.2 06/28/2024   VLDL 18 06/28/2024   LDLCALC 69 06/28/2024    Physical Findings: AIMS:  , ,  ,  ,    CIWA:    COWS:      Psychiatric Specialty Exam:  Presentation  General Appearance:  Casual  Eye Contact: Fleeting  Speech: Normal Rate  Speech Volume: Increased    Mood and Affect  Mood: Euphoric  Affect: Labile   Thought Process  Thought Processes: Disorganized  Orientation:Full (Time, Place and Person)  Thought Content:Illogical; Delusions; Tangential  Hallucinations: Denies  Ideas of Reference:Delusions  Suicidal Thoughts: Denies  Homicidal Thoughts: Denies   Sensorium  Memory: Immediate Fair; Remote Fair  Judgment: Impaired  Insight: Shallow   Executive Functions  Concentration: Poor  Attention Span: Poor  Recall: [employer] of Knowledge: Fair  Language: Fair   Psychomotor Activity  Psychomotor Activity: No data recorded  Musculoskeletal: Strength & Muscle Tone: within normal limits Gait & Station: normal Assets  Assets: Manufacturing Systems Engineer; Desire for Improvement    Physical Exam: Physical Exam Vitals and nursing note reviewed.    ROS Blood pressure 107/63, pulse 87, temperature (!) 97.5 F (36.4 C),  resp. rate 20, height 5' 5 (1.651 m), weight 81 kg, SpO2 100%. Body mass index is 29.7 kg/m.  Diagnosis: Active Problems:   Schizoaffective disorder, bipolar type [facility])  Clinical Decision Making: Patient is currently admitted for  worsening psychosis, manic symptoms, tangential grandiosity.  She needs inpatient hospitalization for medication management and close monitoring.  Patient is unable to make decisions on her own as she is unable to appreciate and understand her mental health history, the current need for treatment.  [facility] has screened her case and for further evaluation for legal guardianship. [facility] screened in her case. Sister and mom are involved in her care.  Treatment Plan Summary:  Safety and Monitoring: If patient meets criteria for nonemergent forced medication we will need to change her status to involuntary commitment             -- Voluntary admission to inpatient psychiatric unit for safety, stabilization and treatment             -- Daily contact with patient to assess and evaluate symptoms and progress in treatment             -- Patient's case to be discussed in multi-disciplinary team meeting             -- Observation Level: q15 minute checks             -- Vital signs:  q12 hours             -- Precautions: suicide, elopement, and assault   2. Psychiatric Diagnoses and Treatment:  Haldol  0.5 mg twice daily patient has been consistently refusing-Will discontinue it we will continue to assess for the need for nonemergent forced medication by 2 physicians Clozaril  enforced with zyprexa  10mg  BID Cymbalta  40 mg daily   -- The risks/benefits/side-effects/alternatives to this medication were discussed in detail with the patient and time was given for questions. The patient consents to medication trial.                -- Metabolic profile and EKG monitoring obtained while on an atypical antipsychotic (BMI: Lipid Panel: HbgA1c: QTc:)              -- Encouraged patient to participate in unit milieu and in scheduled group therapies                            3. Medical Issues Being Addressed:   4. Discharge Planning:   -- Social work and case management to assist with discharge planning and identification  of hospital follow-up needs prior to discharge  -- Estimated LOS: 3-4 days  Allyn Foil, MD 06/28/2024, 9:08 PM

## 2024-06-28 NOTE — Progress Notes (Signed)
°   06/27/24 2100  Psych Admission Type (Psych Patients Only)  Admission Status Voluntary  Psychosocial Assessment  Patient Complaints Irritability;Suspiciousness;Anxiety  Eye Contact Fair  Facial Expression Animated  Affect Blunted  Speech Abusive;Tangential  Interaction Demanding  Motor Activity Slow  Appearance/Hygiene Unremarkable  Behavior Characteristics Agitated  Mood Threatening  Thought Process  Coherency Disorganized  Content Paranoia  Delusions Grandeur  Perception Hallucinations  Hallucination Auditory  Judgment Impaired  Confusion Mild  Danger to Self  Current suicidal ideation? Denies

## 2024-06-28 NOTE — Progress Notes (Signed)
 Pt vomited brownish secretions with undigested food, followed by throwing large cups on water on the floor. Area cleaned, cups removed for safety, and environmental services notified.

## 2024-06-28 NOTE — Group Note (Signed)
 Date:  06/28/2024 Time:  3:53 PM  Group Topic/Focus:  Emotional Education:   The focus of this group is to discuss what feelings/emotions are, and how they are experienced.    Participation Level:  Active  Participation Quality:  Appropriate  Affect:  Appropriate  Cognitive:  Appropriate  Insight: Appropriate  Engagement in Group:  Engaged  Modes of Intervention:  Discussion   Arland Nutting 06/28/2024, 3:53 PM

## 2024-06-29 DIAGNOSIS — F250 Schizoaffective disorder, bipolar type: Secondary | ICD-10-CM | POA: Diagnosis not present

## 2024-06-29 LAB — CBC WITH DIFFERENTIAL/PLATELET
Abs Immature Granulocytes: 0.01 K/uL (ref 0.00–0.07)
Basophils Absolute: 0.1 K/uL (ref 0.0–0.1)
Basophils Relative: 1 %
Eosinophils Absolute: 0.2 K/uL (ref 0.0–0.5)
Eosinophils Relative: 4 %
HCT: 36.5 % (ref 36.0–46.0)
Hemoglobin: 12.2 g/dL (ref 12.0–15.0)
Immature Granulocytes: 0 %
Lymphocytes Relative: 39 %
Lymphs Abs: 2.1 K/uL (ref 0.7–4.0)
MCH: 27.9 pg (ref 26.0–34.0)
MCHC: 33.4 g/dL (ref 30.0–36.0)
MCV: 83.5 fL (ref 80.0–100.0)
Monocytes Absolute: 0.7 K/uL (ref 0.1–1.0)
Monocytes Relative: 12 %
Neutro Abs: 2.4 K/uL (ref 1.7–7.7)
Neutrophils Relative %: 44 %
Platelets: 352 K/uL (ref 150–400)
RBC: 4.37 MIL/uL (ref 3.87–5.11)
RDW: 13.3 % (ref 11.5–15.5)
WBC: 5.5 K/uL (ref 4.0–10.5)
nRBC: 0 % (ref 0.0–0.2)

## 2024-06-29 NOTE — [facility] Group Notes (Signed)
 Spirituality Group   Group Goal: Support / Education around grief and loss   Group Description: Following introductions and group rules, group members engaged in facilitated group dialog and support around topic of loss, with particular support around experiences of loss in their lives. Group members identified types of loss (relationships / self / things) as well as patterns, circumstances, and changes that precipitate loss. Reflection invited on thoughts / feelings around loss, normalized grief responses, and recognized variety in grief experience. Group noted Worden's four tasks of grief in discussion. Group drew on Adlerian / Rogerian, narrative, MI, with Yaloms group therapy as a primary framework.   Observations: Valerie [name] was present for about 0.5 of group and very restless. Participated in discussion as able.  Valerie [name] L. Valerie [name].Div

## 2024-06-29 NOTE — Group Note (Signed)
 Recreation Therapy Group Note   Group Topic:Emotion Expression  Group Date: 06/29/2024 Start Time: 1400 End Time: 1435 Facilitators: Celestia Jeoffrey BRAVO, LRT, CTRS Location: Dayroom  Group Description: Expressive Higher Education Careers Adviser. Patients received a blank postcard template. LRT encouraged pt to create a postcard to themselves in their younger days with the knowledge and wisdom they have today. Pts are encouraged to share something positive or words of encouragement on their post cards using colored pencils and markers. Once finished, patients and LRT had a discussion on why they chose the words they did and what it means to them.  Goal Area(s) Addressed: Patient will increase communication skills.  Patient will reminisce a fond memory in their life.   Patient will practice healthy decision making. Patient will express their emotions in a positive way.    Affect/Mood: N/A   Participation Level: Did not attend    Clinical Observations/Individualized Feedback: Patient did not attend.  Plan: Continue to engage patient in RT group sessions 2-3x/week.   Jeoffrey BRAVO Celestia, LRT, CTRS 06/29/2024 5:07 PM

## 2024-06-29 NOTE — Group Note (Signed)
 Date:  06/29/2024 Time:  4:08 PM  Group Topic/Focus:  Overcoming Stress:   The focus of this group is to define stress and help patients assess their triggers.    Participation Level:  Active  Participation Quality:  Appropriate  Affect:  Appropriate  Cognitive:  Appropriate  Insight: Appropriate  Engagement in Group:  Engaged  Modes of Intervention:  Discussion  Arland Nutting 06/29/2024, 4:08 PM

## 2024-06-29 NOTE — Group Note (Signed)
 [facility] LCSW Group Therapy Note   Group Date: 06/29/2024 Start Time: 1300 End Time: 1400   Type of Therapy/Topic:  Group Therapy:  Balance in Life  Participation Level:  Did Not Attend   Description of Group:    This group will address the concept of balance and how it feels and looks when one is unbalanced. Patients will be encouraged to process areas in their lives that are out of balance, and identify reasons for remaining unbalanced. Facilitators will guide patients utilizing problem- solving interventions to address and correct the stressor making their life unbalanced. Understanding and applying boundaries will be explored and addressed for obtaining  and maintaining a balanced life. Patients will be encouraged to explore ways to assertively make their unbalanced needs known to significant others in their lives, using other group members and facilitator for support and feedback.  Therapeutic Goals: Patient will identify two or more emotions or situations they have that consume much of in their lives. Patient will identify signs/triggers that life has become out of balance:  Patient will identify two ways to set boundaries in order to achieve balance in their lives:  Patient will demonstrate ability to communicate their needs through discussion and/or role plays  Summary of Patient Progress:    Patient did not attend.     Therapeutic Modalities:   Cognitive Behavioral Therapy Solution-Focused Therapy Assertiveness Training   Alveta CHRISTELLA Kerns, LCSW

## 2024-06-29 NOTE — Progress Notes (Signed)
 SI/HI/AVH: denies all  Behavior/Mood: anxious, restless, impulsive, guarded/preoccupied and labile  Interaction/Group attendance: demanding, guarded, intrusive/ 1 of 3 groups   Medication/PRNs: Compliant with all except depakote . Forced med order if refusal to take Clozapine ./No PRN's   Pain: 4/10 generalized  Other: n/a

## 2024-06-29 NOTE — Progress Notes (Signed)
 [facility] MD Progress Note  06/29/2024 12:35 PM Valerie [name]  MRN:  [id number]   Valerie [name] is a 63 year old female presenting voluntary to [facility] requesting a psychiatric evaluation for worsening psychosis. Patient has history of schizophrenia. Patient denied SI, HI, psychosis and alcohol/drug usage. Patient resides at [facility]. Patient reports people are coming by my door talking loud, yelling and screaming and coming in my room with keys. Patient reports having multiple medical Afibs and blood clots due to this. Patient reports people are doing things in the dining room. When asked if she called EMS, patient stated I called EMS, I stripped searched over the phone and called the authorities. Patient is admitted to [facility] unit with Q15 min safety monitoring. Multidisciplinary team approach is offered. Medication management; group/milieu therapy is offered.  Subjective:  Chart reviewed, case discussed in multidisciplinary meeting, patient seen during rounds.      Able to understand medical problem-NO  Able to understand proposed treatment -NO   Able to understand alternative to proposed treatment (if any) -NO   Able to understand option of refusing proposed treatment (including withholding or withdrawing proposed treatment)-NO   Able to appreciate reasonably foreseeable consequences of accepting proposed treatment -NO  After thorough assessment, it is our clinical opinion-  Capacity is not competency. Competency is determined by legal system and judge. Capacity can vary from time to time depending on the mental status of the patient.  Past Psychiatric History: see h&P Family History:  Family History  Problem Relation Age of Onset   Colon cancer Neg Hx    Celiac disease Neg Hx    Inflammatory bowel disease Neg Hx    Social History:  Social History   Substance and Sexual Activity  Alcohol Use Never     Social History   Substance and  Sexual Activity  Drug Use Never    Social History   Socioeconomic History   Marital status: Single    Spouse name: Not on file   Number of children: Not on file   Years of education: Not on file   Highest education level: Not on file  Occupational History   Not on file  Tobacco Use   Smoking status: Never   Smokeless tobacco: Never  Substance and Sexual Activity   Alcohol use: Never   Drug use: Never   Sexual activity: Not Currently    Birth control/protection: Pill  Other Topics Concern   Not on file  Social History Narrative   Not on file   Social Drivers of Health   Tobacco Use: Low Risk (06/19/2024)   Patient History    Smoking Tobacco Use: Never    Smokeless Tobacco Use: Never    Passive Exposure: Not on file  Financial Resource Strain: Not on file  Food Insecurity: Unknown (06/19/2024)   Epic    Worried About Programme Researcher, Broadcasting/film/video in the Last Year: Never true    [employer] of Food in the Last Year: Patient declined  Transportation Needs: No Transportation Needs (06/19/2024)   Epic    Lack of Transportation (Medical): No    Lack of Transportation (Non-Medical): No  Physical Activity: Not on file  Stress: Not on file  Social Connections: Not on file  Depression (PHQ2-9): Low Risk (12/21/2022)   Depression (PHQ2-9)    PHQ-2 Score: 0  Alcohol Screen: Low Risk (06/19/2024)   Alcohol Screen    Last Alcohol Screening Score (AUDIT): 0  Housing: Low Risk (  06/19/2024)   Epic    Unable to Pay for Housing in the Last Year: No    Number of Times Moved in the Last Year: 0    Homeless in the Last Year: No  Utilities: Not At Risk (06/19/2024)   Epic    Threatened with loss of utilities: No  Health Literacy: Not on file   Past Medical History:  Past Medical History:  Diagnosis Date   Chronic lower extremity pain (2ry area of Pain) (Right) 12/21/2022   Chronic pain syndrome 12/20/2022   DDD (degenerative disc disease), 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 Tobie: Pancolonic diverticulosis, tortuous colon, next colonoscopy 2024   TONSILLECTOMY      Current Medications: Current Facility-Administered Medications  Medication Dose Route Frequency Provider Last Rate Last Admin   acetaminophen  (TYLENOL ) tablet 650 mg  650 mg Oral Q6H PRN Onuoha, Chinwendu V, NP   650 mg at 06/29/24 0603   alum & mag hydroxide-simeth (MAALOX/MYLANTA) 200-200-20 MG/5ML suspension 30 mL  30 mL Oral Q4H PRN Bobbitt, Shalon E, NP       amLODipine  (NORVASC ) tablet 5 mg  5 mg Oral Daily Cosme Jacob, MD   5 mg at 06/29/24 1011   aspirin  EC tablet 81 mg  81 mg Oral Daily Bobbitt, Shalon E, NP   81 mg at 06/29/24 1011   bisacodyl  (DULCOLAX) EC tablet 10 mg  10 mg Oral Daily PRN Lousie Calico, MD       cholecalciferol  (VITAMIN D3) 25 MCG (1000 UNIT) tablet 1,000 Units  1,000 Units Oral Weekly Tameshia Bonneville, MD   1,000 Units at 06/27/24 1251   cloZAPine  (CLOZARIL ) tablet 75 mg  75 mg Oral BID Shamari Lofquist, MD   75 mg at 06/29/24 1010   Or   OLANZapine  (ZYPREXA ) injection 10 mg  10 mg Intramuscular BID Andrianna Manalang, MD       diclofenac  Sodium (VOLTAREN ) 1 % topical gel 4 g  4 g Topical QID Donnelly Mellow, MD   4 g at 06/29/24 1025   divalproex  (DEPAKOTE  ER) 24 hr tablet 500 mg  500 mg Oral Daily Kammi Hechler, MD       DULoxetine  (CYMBALTA ) DR capsule 40 mg  40 mg Oral Daily Dieter Hane, MD   40 mg at 06/29/24 1009   famotidine  (PEPCID ) tablet 20 mg  20 mg Oral Daily Tayja Manzer, MD   20 mg at 06/29/24 1011   fluticasone  (FLONASE ) 50 MCG/ACT nasal spray 1 spray  1 spray Each Nare Daily Bobbitt, Shalon E, NP       gabapentin  (NEURONTIN ) capsule 300 mg  300 mg Oral TID Bobbitt, Shalon E, NP   300 mg at 06/29/24 1011   hydrochlorothiazide  (HYDRODIURIL ) tablet 25 mg  25 mg Oral Daily Makeila Yamaguchi, MD   25 mg at  06/29/24 1011   hydrOXYzine  (ATARAX ) tablet 25 mg  25 mg Oral TID PRN Bobbitt, Shalon E, NP   25 mg at 06/26/24 0731   linagliptin  (TRADJENTA ) tablet 5 mg  5 mg Oral Daily Bobbitt, Shalon E, NP   5 mg at 06/29/24 1011   lisinopril  (ZESTRIL ) tablet 30 mg  30 mg Oral Daily Bobbitt, Shalon E, NP   30 mg at 06/29/24 1010   magnesium  hydroxide (MILK OF MAGNESIA) suspension 30 mL  30  mL Oral Daily PRN Bobbitt, Shalon E, NP       metFORMIN  (GLUCOPHAGE ) tablet 1,000 mg  1,000 mg Oral Q supper Bobbitt, Shalon E, NP   1,000 mg at 06/28/24 1649   multivitamin with minerals tablet 1 tablet  1 tablet Oral Daily Bobbitt, Shalon E, NP   1 tablet at 06/29/24 1011   naproxen  (NAPROSYN ) tablet 375 mg  375 mg Oral TID WC Kihanna Kamiya, MD   375 mg at 06/29/24 0801   OLANZapine  (ZYPREXA ) injection 5 mg  5 mg Intramuscular TID PRN Bobbitt, Shalon E, NP   5 mg at 06/22/24 2259   OLANZapine  zydis (ZYPREXA ) disintegrating tablet 5 mg  5 mg Oral TID PRN Bobbitt, Shalon E, NP   5 mg at 06/26/24 1024   ondansetron  (ZOFRAN ) tablet 4 mg  4 mg Oral Q8H PRN Bobbitt, Shalon E, NP       potassium chloride  SA (KLOR-CON  M) CR tablet 20 mEq  20 mEq Oral BID Bobbitt, Shalon E, NP   20 mEq at 06/29/24 1011   propranolol  (INDERAL ) tablet 40 mg  40 mg Oral BID Bobbitt, Shalon E, NP   40 mg at 06/29/24 1010   rosuvastatin  (CRESTOR ) tablet 5 mg  5 mg Oral Daily Bobbitt, Shalon E, NP   5 mg at 06/29/24 1011   traZODone  (DESYREL ) tablet 50 mg  50 mg Oral QHS PRN Bobbitt, Shalon E, NP       zolpidem  (AMBIEN ) tablet 10 mg  10 mg Oral QHS Laurice Iglesia, MD   10 mg at 06/27/24 2241    Lab Results:  Results for orders placed or performed during the hospital encounter of 06/19/24 (from the past 48 hours)  Hemoglobin A1c     Status: Abnormal   Collection Time: 06/28/24  8:57 AM  Result Value Ref Range   Hgb A1c MFr Bld 5.9 (H) 4.8 - 5.6 %    Comment: (NOTE) Diagnosis of Diabetes The following HbA1c ranges recommended by the  American Diabetes Association (ADA) may be used as an aid in the diagnosis of diabetes mellitus.  Hemoglobin             Suggested A1C NGSP%              Diagnosis  <5.7                   Non Diabetic  5.7-6.4                Pre-Diabetic  >6.4                   Diabetic  <7.0                   Glycemic control for                       adults with diabetes.     Mean Plasma Glucose 122.63 mg/dL    Comment: Performed at [facility], [address], [place], [place] [address]  Lipid panel     Status: None   Collection Time: 06/28/24  8:57 AM  Result Value Ref Range   Cholesterol 156 0 - 200 mg/dL    Comment:        ATP III CLASSIFICATION:  <200     mg/dL   Desirable  799-760  mg/dL   Borderline High  >=759    mg/dL   High  Triglycerides 89 <150 mg/dL   HDL 70 >59 mg/dL   Total CHOL/HDL Ratio 2.2 RATIO   VLDL 18 0 - 40 mg/dL   LDL Cholesterol 69 0 - 99 mg/dL    Comment:        Total Cholesterol/HDL:CHD Risk Coronary Heart Disease Risk Table                     Men   Women  1/2 Average Risk   3.4   3.3  Average Risk       5.0   4.4  2 X Average Risk   9.6   7.1  3 X Average Risk  23.4   11.0        Use the calculated Patient Ratio above and the CHD Risk Table to determine the patient's CHD Risk.        ATP III CLASSIFICATION (LDL):  <100     mg/dL   Optimal  899-870  mg/dL   Near or Above                    Optimal  130-159  mg/dL   Borderline  839-810  mg/dL   High  >809     mg/dL   Very High Performed at [facility], [address] [place], [place] [address]   CBC with Differential/Platelet     Status: None   Collection Time: 06/29/24  8:32 AM  Result Value Ref Range   WBC 5.5 4.0 - 10.5 K/uL   RBC 4.37 3.87 - 5.11 MIL/uL   Hemoglobin 12.2 12.0 - 15.0 g/dL   HCT 63.4 63.9 - 53.9 %   MCV 83.5 80.0 - 100.0 fL   MCH 27.9 26.0 - 34.0 pg   MCHC 33.4 30.0 - 36.0 g/dL   RDW 86.6 88.4 - 84.4 %   Platelets 352 150 - 400 K/uL   nRBC  0.0 0.0 - 0.2 %   Neutrophils Relative % 44 %   Neutro Abs 2.4 1.7 - 7.7 K/uL   Lymphocytes Relative 39 %   Lymphs Abs 2.1 0.7 - 4.0 K/uL   Monocytes Relative 12 %   Monocytes Absolute 0.7 0.1 - 1.0 K/uL   Eosinophils Relative 4 %   Eosinophils Absolute 0.2 0.0 - 0.5 K/uL   Basophils Relative 1 %   Basophils Absolute 0.1 0.0 - 0.1 K/uL   Immature Granulocytes 0 %   Abs Immature Granulocytes 0.01 0.00 - 0.07 K/uL    Comment: Performed at [facility], [address] [place], [place] [address]      Blood Alcohol level:  Lab Results  Component Value Date   [facility] <15 06/18/2024    Metabolic Disorder Labs: Lab Results  Component Value Date   HGBA1C 5.9 (H) 06/28/2024   MPG 122.63 06/28/2024   No results found for: PROLACTIN Lab Results  Component Value Date   CHOL 156 06/28/2024   TRIG 89 06/28/2024   HDL 70 06/28/2024   CHOLHDL 2.2 06/28/2024   VLDL 18 06/28/2024   LDLCALC 69 06/28/2024    Physical Findings: AIMS:  , ,  ,  ,    CIWA:    COWS:      Psychiatric Specialty Exam:  Presentation  General Appearance:  Casual  Eye Contact: Fleeting  Speech: Normal Rate  Speech Volume: Increased    Mood and Affect  Mood: Euphoric  Affect: Labile   Thought Process  Thought Processes: Disorganized  Orientation:Full (Time, Place and Person)  Thought Content:Illogical; Delusions; Tangential  Hallucinations: Denies  Ideas of Reference:Delusions  Suicidal Thoughts: Denies  Homicidal Thoughts: Denies   Sensorium  Memory: Immediate Fair; Remote Fair  Judgment: Impaired  Insight: Shallow   Executive Functions  Concentration: Poor  Attention Span: Poor  Recall: [employer] of Knowledge: Fair  Language: Fair   Psychomotor Activity  Psychomotor Activity: No data recorded  Musculoskeletal: Strength & Muscle Tone: within normal limits Gait & Station: normal Assets  Assets: Manufacturing Systems Engineer; Desire for  Improvement    Physical Exam: Physical Exam Vitals and nursing note reviewed.    ROS Blood pressure (!) 115/52, pulse 78, temperature (!) 97.5 F (36.4 C), resp. rate 17, height 5' 5 (1.651 m), weight 81 kg, SpO2 100%. Body mass index is 29.7 kg/m.  Diagnosis: Active Problems:   Schizoaffective disorder, bipolar type [facility])  Clinical Decision Making: Patient is currently admitted for worsening psychosis, manic symptoms, tangential grandiosity.  She needs inpatient hospitalization for medication management and close monitoring.  Patient is unable to make decisions on her own as she is unable to appreciate and understand her mental health history, the current need for treatment.  [facility] has screened her case and for further evaluation for legal guardianship. [facility] screened in her case. Sister and mom are involved in her care.  Treatment Plan Summary:  Safety and Monitoring: If patient meets criteria for nonemergent forced medication we will need to change her status to involuntary commitment             -- Voluntary admission to inpatient psychiatric unit for safety, stabilization and treatment             -- Daily contact with patient to assess and evaluate symptoms and progress in treatment             -- Patient's case to be discussed in multi-disciplinary team meeting             -- Observation Level: q15 minute checks             -- Vital signs:  q12 hours             -- Precautions: suicide, elopement, and assault   2. Psychiatric Diagnoses and Treatment:  Haldol  0.5 mg twice daily patient has been consistently refusing-Will discontinue it we will continue to assess for the need for nonemergent forced medication by 2 physicians Clozaril  enforced with zyprexa  10mg  BID Cymbalta  40 mg daily   -- The risks/benefits/side-effects/alternatives to this medication were discussed in detail with the patient and time was given for questions. The patient consents to medication trial.                 -- Metabolic profile and EKG monitoring obtained while on an atypical antipsychotic (BMI: Lipid Panel: HbgA1c: QTc:)              -- Encouraged patient to participate in unit milieu and in scheduled group therapies                            3. Medical Issues Being Addressed:   4. Discharge Planning:   -- Social work and case management to assist with discharge planning and identification of hospital follow-up needs prior to discharge  -- Estimated LOS: 3-4 days  Almin Livingstone, MD 06/29/2024, 12:35 PM

## 2024-06-29 NOTE — Plan of Care (Signed)
°  Problem: Education: Goal: Emotional status will improve Outcome: Not Progressing Goal: Mental status will improve Outcome: Not Progressing   Problem: Self-Concept: Goal: Ability to identify factors that promote anxiety will improve Outcome: Not Progressing

## 2024-06-29 NOTE — BH IP Treatment Plan (Signed)
 Interdisciplinary Treatment and Diagnostic Plan Update  06/29/2024 Time of Session: 9:00 AM Valerie [name] MRN: [id number]  Principal Diagnosis: Schizophrenia (HCC)  Secondary Diagnoses: Active Problems:   Schizoaffective disorder, bipolar type (HCC)   Current Medications:  Current Facility-Administered Medications  Medication Dose Route Frequency Provider Last Rate Last Admin   acetaminophen  (TYLENOL ) tablet 650 mg  650 mg Oral Q6H PRN Onuoha, Chinwendu V, NP   650 mg at 06/29/24 0603   alum & mag hydroxide-simeth (MAALOX/MYLANTA) 200-200-20 MG/5ML suspension 30 mL  30 mL Oral Q4H PRN Bobbitt, Shalon E, NP       amLODipine  (NORVASC ) tablet 5 mg  5 mg Oral Daily Jadapalle, Sree, MD   5 mg at 06/29/24 1011   aspirin  EC tablet 81 mg  81 mg Oral Daily Bobbitt, Shalon E, NP   81 mg at 06/29/24 1011   bisacodyl  (DULCOLAX) EC tablet 10 mg  10 mg Oral Daily PRN Jadapalle, Sree, MD       cholecalciferol  (VITAMIN D3) 25 MCG (1000 UNIT) tablet 1,000 Units  1,000 Units Oral Weekly Jadapalle, Sree, MD   1,000 Units at 06/27/24 1251   cloZAPine  (CLOZARIL ) tablet 75 mg  75 mg Oral BID Jadapalle, Sree, MD   75 mg at 06/29/24 1010   Or   OLANZapine  (ZYPREXA ) injection 10 mg  10 mg Intramuscular BID Jadapalle, Sree, MD       diclofenac  Sodium (VOLTAREN ) 1 % topical gel 4 g  4 g Topical QID Jadapalle, Sree, MD   4 g at 06/29/24 1025   divalproex  (DEPAKOTE  ER) 24 hr tablet 500 mg  500 mg Oral Daily Jadapalle, Sree, MD       DULoxetine  (CYMBALTA ) DR capsule 40 mg  40 mg Oral Daily Jadapalle, Sree, MD   40 mg at 06/29/24 1009   famotidine  (PEPCID ) tablet 20 mg  20 mg Oral Daily Jadapalle, Sree, MD   20 mg at 06/29/24 1011   fluticasone  (FLONASE ) 50 MCG/ACT nasal spray 1 spray  1 spray Each Nare Daily Bobbitt, Shalon E, NP       gabapentin  (NEURONTIN ) capsule 300 mg  300 mg Oral TID Bobbitt, Shalon E, NP   300 mg at 06/29/24 1011   hydrochlorothiazide  (HYDRODIURIL ) tablet 25 mg  25 mg Oral Daily Jadapalle,  Sree, MD   25 mg at 06/29/24 1011   hydrOXYzine  (ATARAX ) tablet 25 mg  25 mg Oral TID PRN Bobbitt, Shalon E, NP   25 mg at 06/26/24 0731   linagliptin  (TRADJENTA ) tablet 5 mg  5 mg Oral Daily Bobbitt, Shalon E, NP   5 mg at 06/29/24 1011   lisinopril  (ZESTRIL ) tablet 30 mg  30 mg Oral Daily Bobbitt, Shalon E, NP   30 mg at 06/29/24 1010   magnesium  hydroxide (MILK OF MAGNESIA) suspension 30 mL  30 mL Oral Daily PRN Bobbitt, Shalon E, NP       metFORMIN  (GLUCOPHAGE ) tablet 1,000 mg  1,000 mg Oral Q supper Bobbitt, Shalon E, NP   1,000 mg at 06/28/24 1649   multivitamin with minerals tablet 1 tablet  1 tablet Oral Daily Bobbitt, Shalon E, NP   1 tablet at 06/29/24 1011   naproxen  (NAPROSYN ) tablet 375 mg  375 mg Oral TID WC Jadapalle, Sree, MD   375 mg at 06/29/24 0801   OLANZapine  (ZYPREXA ) injection 5 mg  5 mg Intramuscular TID PRN Bobbitt, Shalon E, NP   5 mg at 06/22/24 2259   OLANZapine  zydis (ZYPREXA ) disintegrating tablet 5 mg  5 mg Oral TID PRN Bobbitt, Shalon E, NP   5 mg at 06/26/24 1024   ondansetron  (ZOFRAN ) tablet 4 mg  4 mg Oral Q8H PRN Bobbitt, Shalon E, NP       potassium chloride  SA (KLOR-CON  M) CR tablet 20 mEq  20 mEq Oral BID Bobbitt, Shalon E, NP   20 mEq at 06/29/24 1011   propranolol  (INDERAL ) tablet 40 mg  40 mg Oral BID Bobbitt, Shalon E, NP   40 mg at 06/29/24 1010   rosuvastatin  (CRESTOR ) tablet 5 mg  5 mg Oral Daily Bobbitt, Shalon E, NP   5 mg at 06/29/24 1011   traZODone  (DESYREL ) tablet 50 mg  50 mg Oral QHS PRN Bobbitt, Shalon E, NP       zolpidem  (AMBIEN ) tablet 10 mg  10 mg Oral QHS Jadapalle, Sree, MD   10 mg at 06/27/24 2241   PTA Medications: Medications Prior to Admission  Medication Sig Dispense Refill Last Dose/Taking   acetaminophen  (TYLENOL ) 650 MG CR tablet Take 650 mg by mouth every 8 (eight) hours as needed for pain.      benztropine (COGENTIN) 0.5 MG tablet Take by mouth.      bisacodyl  5 MG EC tablet As directed for procedure 8 tablet 0     Cholecalciferol  (VITAMIN D ) 125 MCG (5000 UT) CAPS Take 1,000 Units by mouth once a week.      cloZAPine  (CLOZARIL ) 100 MG tablet Take 200 mg by mouth 2 (two) times daily.      DULoxetine  (CYMBALTA ) 20 MG capsule Take 20 mg by mouth daily.      fluticasone  (FLONASE ) 50 MCG/ACT nasal spray Place 1 spray into both nostrils daily.      gabapentin  (NEURONTIN ) 300 MG capsule Take 300 mg by mouth 3 (three) times daily.      haloperidol  (HALDOL ) 0.5 MG tablet Take 0.5 mg by mouth 2 (two) times daily.      hydrochlorothiazide  (HYDRODIURIL ) 25 MG tablet Take 25 mg by mouth daily.      lisinopril  (ZESTRIL ) 30 MG tablet Take 30 mg by mouth daily.      loperamide (IMODIUM) 2 MG capsule Take 2 mg by mouth as needed for diarrhea or loose stools. Take 1 capsule by mouth as needed with each loose stool/diarrhea nte 8 doses in 24 hr      Multiple Vitamin (DAILY VITE PO) Take 1 tablet by mouth daily.      naproxen  (NAPROSYN ) 500 MG tablet Take 500 mg by mouth every 12 (twelve) hours as needed.      omeprazole (PRILOSEC) 20 MG capsule Take 20 mg by mouth daily.      ondansetron  (ZOFRAN ) 4 MG tablet Take 4 mg by mouth every 8 (eight) hours as needed for nausea or vomiting.      potassium chloride  SA (KLOR-CON  M) 20 MEQ tablet Take 20 mEq by mouth 2 (two) times daily.      propranolol  (INDERAL ) 40 MG tablet Take 40 mg by mouth 2 (two) times daily.      rosuvastatin  (CRESTOR ) 5 MG tablet Take 5 mg by mouth daily.      sodium phosphate  (FLEET) ENEM As directed for procedure 133 mL 0    triamcinolone ointment (KENALOG) 0.1 % Apply topically.       Patient Stressors: Medication change or noncompliance    Patient Strengths: Ability for insight   Treatment Modalities: Medication Management, Group therapy, Case management,  1 to 1 session with clinician, Psychoeducation,  Recreational therapy.   Physician Treatment Plan for Primary Diagnosis: Schizophrenia (HCC) Long Term Goal(s): Improvement in symptoms so as ready  for discharge   Short Term Goals: Ability to identify changes in lifestyle to reduce recurrence of condition will improve Ability to verbalize feelings will improve Ability to disclose and discuss suicidal ideas Ability to demonstrate self-control will improve Ability to identify and develop effective coping behaviors will improve  Medication Management: Evaluate patient's response, side effects, and tolerance of medication regimen.  Therapeutic Interventions: 1 to 1 sessions, Unit Group sessions and Medication administration.  Evaluation of Outcomes: Not Progressing  Physician Treatment Plan for Secondary Diagnosis: Active Problems:   Schizoaffective disorder, bipolar type (HCC)  Long Term Goal(s): Improvement in symptoms so as ready for discharge   Short Term Goals: Ability to identify changes in lifestyle to reduce recurrence of condition will improve Ability to verbalize feelings will improve Ability to disclose and discuss suicidal ideas Ability to demonstrate self-control will improve Ability to identify and develop effective coping behaviors will improve     Medication Management: Evaluate patient's response, side effects, and tolerance of medication regimen.  Therapeutic Interventions: 1 to 1 sessions, Unit Group sessions and Medication administration.  Evaluation of Outcomes: Not Progressing   RN Treatment Plan for Primary Diagnosis: Schizophrenia (HCC) Long Term Goal(s): Knowledge of disease and therapeutic regimen to maintain health will improve  Short Term Goals: Ability to verbalize frustration and anger appropriately will improve, Ability to demonstrate self-control, Ability to participate in decision making will improve, Ability to verbalize feelings will improve, Ability to disclose and discuss suicidal ideas, and Ability to identify and develop effective coping behaviors will improve  Medication Management: RN will administer medications as ordered by provider,  will assess and evaluate patient's response and provide education to patient for prescribed medication. RN will report any adverse and/or side effects to prescribing provider.  Therapeutic Interventions: 1 on 1 counseling sessions, Psychoeducation, Medication administration, Evaluate responses to treatment, Monitor vital signs and CBGs as ordered, Perform/monitor CIWA, COWS, AIMS and Fall Risk screenings as ordered, Perform wound care treatments as ordered.  Evaluation of Outcomes: Not Progressing   LCSW Treatment Plan for Primary Diagnosis: Schizophrenia (HCC) Long Term Goal(s): Safe transition to appropriate next level of care at discharge, Engage patient in therapeutic group addressing interpersonal concerns.  Short Term Goals: Engage patient in aftercare planning with referrals and resources, Increase social support, Increase ability to appropriately verbalize feelings, Increase emotional regulation, Facilitate acceptance of mental health diagnosis and concerns, Facilitate patient progression through stages of change regarding substance use diagnoses and concerns, and Identify triggers associated with mental health/substance abuse issues  Therapeutic Interventions: Assess for all discharge needs, 1 to 1 time with Social worker, Explore available resources and support systems, Assess for adequacy in community support network, Educate family and significant other(s) on suicide prevention, Complete Psychosocial Assessment, Interpersonal group therapy.  Evaluation of Outcomes: Not Progressing   Progress in Treatment: Attending groups: No. 06/29/24 Update: Yes. And No.  Participating in groups: No. 06/29/24 Update: Yes. And No.  Taking medication as prescribed: Yes. 06/29/24 Update: Yes.  Toleration medication: No.  06/29/24 Update: Yes. Family/Significant other contact made: No, will contact:  CSW will contact if given permission.  06/29/24 Update: No, will contact:  CSW will contact if given  permission. Patient understands diagnosis: No. 06/29/24 Update: No. Discussing patient identified problems/goals with staff: Yes. 06/29/24 Update: Yes. Medical problems stabilized or resolved: Yes. 06/29/24 Update: Yes. Denies suicidal/homicidal ideation: Yes. 06/29/24  Update: Yes. Issues/concerns per patient self-inventory: No. 06/29/24 Update: No.  Other: None 06/29/24 Update: None.   New problem(s) identified: No, Describe:  None  identified 06/29/24 Update: No, Describe:  None     New Short Term/Long Term Goal(s): elimination of symptoms of psychosis, medication management for mood stabilization; elimination of SI thoughts; development of comprehensive mental wellness plan. 06/29/24 Update: No changes at this time.    Patient Goals:   To continue to get well 06/29/24 Update: No changes at this time.    Discharge Plan or Barriers: CSW will assist with appropriate discharge planning 06/29/24 Update:  CSW has made APS report to [facility] APs following safety concerns. CSW to continue to coordinate with DSS worker to continue to assess.    Reason for Continuation of Hospitalization: Delusions  Mania Medication stabilization    Estimated Length of Stay:1 to 7 days 06/29/24 Update: TBD.  Last 3 [place] Suicide Severity Risk Score: Flowsheet Row Admission (Current) from 06/19/2024 in [facility] ED from 06/18/2024 in [facility] Emergency Department at [facility] ED from 05/24/2022 in [facility] Emergency Department at [facility]  C-SSRS RISK CATEGORY No Risk No Risk No Risk    Last PHQ 2/9 Scores:    12/21/2022    9:00 AM  Depression screen PHQ 2/9  Decreased Interest 0  Down, Depressed, Hopeless 0  PHQ - 2 Score 0    Scribe for Treatment Team: Alveta CHRISTELLA Kerns, LCSW 06/29/2024 4:53 PM

## 2024-06-29 NOTE — Progress Notes (Signed)
°   06/28/24 2351  Psych Admission Type (Psych Patients Only)  Admission Status Voluntary  Psychosocial Assessment  Patient Complaints Worrying;Suspiciousness  Eye Contact Darting  Facial Expression Animated  Affect Blunted  Speech Tangential  Interaction Demanding;Guarded  Motor Activity Slow;Restless  Appearance/Hygiene Unremarkable  Behavior Characteristics Anxious;Impulsive;Resistant to care  Mood Preoccupied  Thought Process  Coherency Disorganized  Content Paranoia  Delusions Somatic;Paranoid  Perception Hallucinations  Hallucination Auditory  Judgment Impaired  Confusion Mild  Danger to Self  Current suicidal ideation? Denies  Danger to Others  Danger to Others None reported or observed    Estimated Sleeping Duration (Last 24 Hours): 6.75-8.00 hours

## 2024-06-29 NOTE — Plan of Care (Signed)
  Problem: Activity: Goal: Sleeping patterns will improve Outcome: Progressing   

## 2024-06-30 NOTE — Plan of Care (Signed)
 Patient alert and oriented x4. Denies SI, HI, AVH; verbally contracts for safety at this time. Denies pain. Scheduled meds administer per [facility] w/much hesitation, but eventually all taken by mouth. Support and encouragement provided.  Routine safety checks conducted every 15 minutes.  Patient informed to notify staff with problems or concerns. 2 hours after meds; pt c/o lower leg weakness and requested a wheelchair to transport to bedroom. Pt stated I can not sit in that bed, so I am going to sleep in this wheelchair. Patient remains safe at this time.  Problem: Education: Goal: Mental status will improve Outcome: Progressing   Problem: Activity: Goal: Interest or engagement in activities will improve Outcome: Progressing Goal: Sleeping patterns will improve Outcome: Progressing   Problem: Health Behavior/Discharge Planning: Goal: Identification of resources available to assist in meeting health care needs will improve Outcome: Progressing Goal: Compliance with treatment plan for underlying cause of condition will improve Outcome: Progressing

## 2024-06-30 NOTE — Group Note (Signed)
 Date:  06/30/2024 Time:  3:36 PM  Group Topic/Focus:  Movement Therapy, Getting fit with Ryane Konieczny.    Participation Level:  Did Not Attend    [name] 06/30/2024, 3:36 PM

## 2024-06-30 NOTE — Plan of Care (Signed)
" °  Problem: Coping: Goal: Ability to verbalize frustrations and anger appropriately will improve Outcome: Not Progressing Goal: Ability to demonstrate self-control will improve Outcome: Not Progressing   Problem: Health Behavior/Discharge Planning: Goal: Identification of resources available to assist in meeting health care needs will improve Outcome: Not Progressing Goal: Compliance with treatment plan for underlying cause of condition will improve Outcome: Not Progressing   "

## 2024-06-30 NOTE — Group Note (Signed)
 Date:  06/30/2024 Time:  10:50 AM  Group Topic/Focus:  Stages of Change:   The focus of this group is to explain the stages of change and help patients identify changes they want to make upon discharge.    Participation Level:  Minimal  Participation Quality:  Intrusive  Affect:  Angry and Defensive  Cognitive:  Alert  Insight: Lacking  Engagement in Group:  Defensive and Distracting  Modes of Intervention:  Discussion  Additional Comments:    Valerie [name] Bias 06/30/2024, 10:50 AM

## 2024-06-30 NOTE — Group Note (Signed)
 Physical/Occupational Therapy Group Note  Group Topic: Pain Management and Coping   Group Date: 06/30/2024 Start Time: 1300 End Time: 1400 Facilitators: Clive Warren CROME, OT   Group Description: Group discussed impact of chronic/acute pain on safety and independence with functional tasks and impact on mental health.  Identified and discussed any previously learned or implemented strategies used.  Discussed and reviewed cognitive behavioral pain coping strategies to address/improve overall management of pain. Discussed relaxation, distraction techniques, cognitive restructuring, activity pacing/energy conservation, environment/home safety modifications, and role of sleep and sleep hygiene. Allowed time for questions and further discussion.  Therapeutic Goal(s):  Identify and discuss previously utilized pain coping strategies and implications of pain on function/well-being Identify and discuss implementing new cognitive behavioral pain coping strategies into daily routines Demonstrate understanding and performance of learned cognitive behavioral pain coping strategies  Individual Participation: Pt initially declining to participate then joins for ~1/2 of the session. 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 Pt required MIN VC to redirect. Was able to identify strategies and ideas for pleasant imagery to support pain/stress mgt. Pt got up from the table without warning and did not return for remainder of the session.   Participation Level: Moderate   Participation Quality: Minimal Cues   Behavior: Alert, Disruptive, Interactive , and Off-task   Speech/Thought Process: Distracted, Relevant, and Tangential    Affect/Mood: Appropriate   Insight: Poor   Judgement: Poor   Modes of Intervention: Activity, Clarification, Discussion, Education, Exploration, Problem-solving,  Rapport Building, Socialization, and Support  Patient Response to Interventions:  Disengaged and Receptive   Plan: Continue to engage patient in PT/OT groups 1 - 2x/week.  Cyler Kappes R., MPH, MS, OTR/L ascom [phone] 06/30/2024, 3:34 PM

## 2024-06-30 NOTE — Progress Notes (Signed)
(  Sleep Hours) - 6.25 (Any PRNs that were needed, meds refused, or side effects to meds)- N/A (Any disturbances and when (visitation, over night)-N/A (Concerns raised by the patient)- Patient informed staff to notify staff with problems or concerns. 2 hours after meds; pt c/o lower leg weakness and requested a wheelchair to transport to bedroom. Pt stated I can not sit in that bed, so I am going to sleep in this wheelchair.  (SI/HI/AVH)- Denies

## 2024-06-30 NOTE — Progress Notes (Signed)
" °   06/30/24 0900  Psych Admission Type (Psych Patients Only)  Admission Status Voluntary  Psychosocial Assessment  Patient Complaints Restlessness;Anxiety  Eye Contact Fair  Facial Expression Animated  Affect Labile  Speech Rhyming  Interaction Guarded  Motor Activity Slow  Appearance/Hygiene Unremarkable  Behavior Characteristics Anxious;Restless  Mood Labile  Thought Process  Coherency Disorganized  Content Religiosity  Delusions Religious  Perception Hallucinations  Hallucination Auditory  Judgment Impaired  Confusion Mild  Danger to Self  Current suicidal ideation? Denies    "

## 2024-06-30 NOTE — Group Note (Signed)
 Recreation Therapy Group Note   Group Topic:Communication  Group Date: 06/30/2024 Start Time: 1405 End Time: 1440 Facilitators: Celestia Jeoffrey BRAVO, LRT, CTRS Location: Courtyard  Group Description: Music. Patients are encouraged to name their favorite song(s) for LRT to play song through speaker for group to hear, while in the courtyard getting fresh air and sunlight. Patients educated on the definition of leisure and the importance of having different leisure interests outside of the hospital. Group discussed how leisure activities can often be used as pharmacologist and that listening to music and being outside are examples.    Goal Area(s) Addressed:  Patient will identify a current leisure interest.  Patient will practice making a positive decision. Patient will have the opportunity to try a new leisure activity.   Affect/Mood: N/A   Participation Level: Did not attend    Clinical Observations/Individualized Feedback: Patient did not attend.  Plan: Continue to engage patient in RT group sessions 2-3x/week.   Jeoffrey BRAVO Celestia, LRT, CTRS 06/30/2024 4:57 PM

## 2024-06-30 NOTE — Progress Notes (Signed)
 [facility] MD Progress Note  06/30/2024 12:35 PM Valerie [name]  MRN:  [id number]   Valerie [name] is a 63 year old female presenting voluntary to [facility] requesting a psychiatric evaluation for worsening psychosis. Patient has history of schizophrenia. Patient denied SI, HI, psychosis and alcohol/drug usage. Patient resides at [facility]. Patient reports people are coming by my door talking loud, yelling and screaming and coming in my room with keys. Patient reports having multiple medical Afibs and blood clots due to this. Patient reports people are doing things in the dining room. When asked if she called EMS, patient stated I called EMS, I stripped searched over the phone and called the authorities. Patient is admitted to [facility] unit with Q15 min safety monitoring. Multidisciplinary team approach is offered. Medication management; group/milieu therapy is offered.   Subjective:  Chart reviewed, case discussed in multidisciplinary meeting, patient seen during rounds.  Today on interview patient is noted to be sitting in the day area.  She remains intrusive and comments on other patients needs.  She continues to talk about her new husband coming to get her and them going to live in [place] and have family.  She lacks insight into the need of medications for her mental health problems.  She continues to claim medications or like toxic material.  She denies SI/HI/plan and denies hallucinations. Capacity to make medical decisions: IVC initiated  Able to understand medical problem-NO  Able to understand proposed treatment -NO   Able to understand alternative to proposed treatment (if any) -NO   Able to understand option of refusing proposed treatment (including withholding or withdrawing proposed treatment)-NO   Able to appreciate reasonably foreseeable consequences of accepting proposed treatment -NO  After thorough assessment, it is our clinical  opinion-  Capacity is not competency. Competency is determined by legal system and judge. Capacity can vary from time to time depending on the mental status of the patient.  Past Psychiatric History: see h&P Family History:  Family History  Problem Relation Age of Onset   Colon cancer Neg Hx    Celiac disease Neg Hx    Inflammatory bowel disease Neg Hx    Social History:  Social History   Substance and Sexual Activity  Alcohol Use Never     Social History   Substance and Sexual Activity  Drug Use Never    Social History   Socioeconomic History   Marital status: Single    Spouse name: Not on file   Number of children: Not on file   Years of education: Not on file   Highest education level: Not on file  Occupational History   Not on file  Tobacco Use   Smoking status: Never   Smokeless tobacco: Never  Substance and Sexual Activity   Alcohol use: Never   Drug use: Never   Sexual activity: Not Currently    Birth control/protection: Pill  Other Topics Concern   Not on file  Social History Narrative   Not on file   Social Drivers of Health   Tobacco Use: Low Risk (06/19/2024)   Patient History    Smoking Tobacco Use: Never    Smokeless Tobacco Use: Never    Passive Exposure: Not on file  Financial Resource Strain: Not on file  Food Insecurity: Unknown (06/19/2024)   Epic    Worried About Programme Researcher, Broadcasting/film/video in the Last Year: Never true    [employer] of Food in the Last Year: Patient declined  Transportation Needs: No Transportation Needs (06/19/2024)   Epic    Lack of Transportation (Medical): No    Lack of Transportation (Non-Medical): No  Physical Activity: Not on file  Stress: Not on file  Social Connections: Not on file  Depression (PHQ2-9): Low Risk (12/21/2022)   Depression (PHQ2-9)    PHQ-2 Score: 0  Alcohol Screen: Low Risk (06/19/2024)   Alcohol Screen    Last Alcohol Screening Score (AUDIT): 0  Housing: Low Risk (06/19/2024)    Epic    Unable to Pay for Housing in the Last Year: No    Number of Times Moved in the Last Year: 0    Homeless in the Last Year: No  Utilities: Not At Risk (06/19/2024)   Epic    Threatened with loss of utilities: No  Health Literacy: Not on file   Past Medical History:  Past Medical History:  Diagnosis Date   Chronic lower extremity pain (2ry area of Pain) (Right) 12/21/2022   Chronic pain syndrome 12/20/2022   DDD (degenerative disc disease), 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 Tobie: Pancolonic diverticulosis, tortuous colon, next colonoscopy 2024   TONSILLECTOMY      Current Medications: Current Facility-Administered Medications  Medication Dose Route Frequency Provider Last Rate Last Admin   acetaminophen  (TYLENOL ) tablet 650 mg  650 mg Oral Q6H PRN Onuoha, Chinwendu V, NP   650 mg at 06/29/24 0603   alum & mag hydroxide-simeth (MAALOX/MYLANTA) 200-200-20 MG/5ML suspension 30 mL  30 mL Oral Q4H PRN Bobbitt, Shalon E, NP       amLODipine  (NORVASC ) tablet 5 mg  5 mg Oral Daily Damieon Armendariz, MD   5 mg at 06/30/24 1020   aspirin  EC tablet 81 mg  81 mg Oral Daily Bobbitt, Shalon E, NP   81 mg at 06/30/24 1020   bisacodyl  (DULCOLAX) EC tablet 10 mg  10 mg Oral Daily PRN Calix Heinbaugh, MD       cholecalciferol  (VITAMIN D3) 25 MCG (1000 UNIT) tablet 1,000 Units  1,000 Units Oral Weekly Brittne Kawasaki, MD   1,000 Units at 06/27/24 1251   cloZAPine  (CLOZARIL ) tablet 75 mg  75 mg Oral BID Akeila Lana, MD   75 mg at 06/30/24 1029   Or   OLANZapine  (ZYPREXA ) injection 10 mg  10 mg Intramuscular BID Arnoldo Hildreth, MD       diclofenac  Sodium (VOLTAREN ) 1 % topical gel 4 g  4 g Topical QID Donnelly Mellow, MD   4 g at 06/29/24 2122   divalproex  (DEPAKOTE  ER) 24 hr tablet 500 mg  500 mg Oral Daily Vianney Kopecky,  Knight Oelkers, MD       DULoxetine  (CYMBALTA ) DR capsule 40 mg  40 mg Oral Daily Idelia Caudell, MD   40 mg at 06/30/24 1021   famotidine  (PEPCID ) tablet 20 mg  20 mg Oral Daily Loghan Subia, MD   20 mg at 06/30/24 1021   fluticasone  (FLONASE ) 50 MCG/ACT nasal spray 1 spray  1 spray Each Nare Daily Bobbitt, Shalon E, NP       gabapentin  (NEURONTIN ) capsule 300 mg  300 mg Oral TID Bobbitt, Shalon E, NP   300 mg at 06/30/24 1020   hydrochlorothiazide  (HYDRODIURIL ) tablet 25 mg  25 mg Oral Daily Analiza Cowger, MD   25 mg at 06/30/24 1017  hydrOXYzine  (ATARAX ) tablet 25 mg  25 mg Oral TID PRN Bobbitt, Shalon E, NP   25 mg at 06/26/24 0731   linagliptin  (TRADJENTA ) tablet 5 mg  5 mg Oral Daily Bobbitt, Shalon E, NP   5 mg at 06/30/24 1020   lisinopril  (ZESTRIL ) tablet 30 mg  30 mg Oral Daily Bobbitt, Shalon E, NP   30 mg at 06/30/24 1020   magnesium  hydroxide (MILK OF MAGNESIA) suspension 30 mL  30 mL Oral Daily PRN Bobbitt, Shalon E, NP       metFORMIN  (GLUCOPHAGE ) tablet 1,000 mg  1,000 mg Oral Q supper Bobbitt, Shalon E, NP   1,000 mg at 06/29/24 1726   multivitamin with minerals tablet 1 tablet  1 tablet Oral Daily Bobbitt, Shalon E, NP   1 tablet at 06/30/24 1017   naproxen  (NAPROSYN ) tablet 375 mg  375 mg Oral TID WC Kadyn Chovan, MD   375 mg at 06/30/24 1159   OLANZapine  (ZYPREXA ) injection 5 mg  5 mg Intramuscular TID PRN Bobbitt, Shalon E, NP   5 mg at 06/22/24 2259   OLANZapine  zydis (ZYPREXA ) disintegrating tablet 5 mg  5 mg Oral TID PRN Bobbitt, Shalon E, NP   5 mg at 06/26/24 1024   ondansetron  (ZOFRAN ) tablet 4 mg  4 mg Oral Q8H PRN Bobbitt, Shalon E, NP       potassium chloride  SA (KLOR-CON  M) CR tablet 20 mEq  20 mEq Oral BID Bobbitt, Shalon E, NP   20 mEq at 06/30/24 1021   propranolol  (INDERAL ) tablet 40 mg  40 mg Oral BID Bobbitt, Shalon E, NP   40 mg at 06/30/24 1021   rosuvastatin  (CRESTOR ) tablet 5 mg  5 mg Oral Daily Bobbitt, Shalon E, NP   5 mg at 06/30/24 1019    traZODone  (DESYREL ) tablet 50 mg  50 mg Oral QHS PRN Bobbitt, Shalon E, NP       zolpidem  (AMBIEN ) tablet 10 mg  10 mg Oral QHS Kasey Ewings, MD   10 mg at 06/29/24 2129    Lab Results:  Results for orders placed or performed during the hospital encounter of 06/19/24 (from the past 48 hours)  CBC with Differential/Platelet     Status: None   Collection Time: 06/29/24  8:32 AM  Result Value Ref Range   WBC 5.5 4.0 - 10.5 K/uL   RBC 4.37 3.87 - 5.11 MIL/uL   Hemoglobin 12.2 12.0 - 15.0 g/dL   HCT 63.4 63.9 - 53.9 %   MCV 83.5 80.0 - 100.0 fL   MCH 27.9 26.0 - 34.0 pg   MCHC 33.4 30.0 - 36.0 g/dL   RDW 86.6 88.4 - 84.4 %   Platelets 352 150 - 400 K/uL   nRBC 0.0 0.0 - 0.2 %   Neutrophils Relative % 44 %   Neutro Abs 2.4 1.7 - 7.7 K/uL   Lymphocytes Relative 39 %   Lymphs Abs 2.1 0.7 - 4.0 K/uL   Monocytes Relative 12 %   Monocytes Absolute 0.7 0.1 - 1.0 K/uL   Eosinophils Relative 4 %   Eosinophils Absolute 0.2 0.0 - 0.5 K/uL   Basophils Relative 1 %   Basophils Absolute 0.1 0.0 - 0.1 K/uL   Immature Granulocytes 0 %   Abs Immature Granulocytes 0.01 0.00 - 0.07 K/uL    Comment: Performed at [facility], [address] [place], [place] [address]      Blood Alcohol level:  Lab Results  Component Value Date  ETH <15 06/18/2024    Metabolic Disorder Labs: Lab Results  Component Value Date   HGBA1C 5.9 (H) 06/28/2024   MPG 122.63 06/28/2024   No results found for: PROLACTIN Lab Results  Component Value Date   CHOL 156 06/28/2024   TRIG 89 06/28/2024   HDL 70 06/28/2024   CHOLHDL 2.2 06/28/2024   VLDL 18 06/28/2024   LDLCALC 69 06/28/2024    Physical Findings: AIMS:  , ,  ,  ,    CIWA:    COWS:      Psychiatric Specialty Exam:  Presentation  General Appearance:  Casual  Eye Contact: Fleeting  Speech: Normal Rate  Speech Volume: Increased    Mood and Affect  Mood: Euphoric  Affect: Labile   Thought Process   Thought Processes: Disorganized  Orientation:Full (Time, Place and Person)  Thought Content:Illogical; Delusions; Tangential  Hallucinations: Denies  Ideas of Reference:Delusions  Suicidal Thoughts: Denies  Homicidal Thoughts: Denies   Sensorium  Memory: Immediate Fair; Remote Fair  Judgment: Impaired  Insight: Shallow   Executive Functions  Concentration: Poor  Attention Span: Poor  Recall: [employer] of Knowledge: Fair  Language: Fair   Psychomotor Activity  Psychomotor Activity: No data recorded  Musculoskeletal: Strength & Muscle Tone: within normal limits Gait & Station: normal Assets  Assets: Manufacturing Systems Engineer; Desire for Improvement    Physical Exam: Physical Exam Vitals and nursing note reviewed.    ROS Blood pressure (!) 107/59, pulse 96, temperature 97.9 F (36.6 C), resp. rate 18, height 5' 5 (1.651 m), weight 81 kg, SpO2 100%. Body mass index is 29.7 kg/m.  Diagnosis: Active Problems:   Schizoaffective disorder, bipolar type [facility]  Clinical Decision Making: Patient is currently admitted for worsening psychosis, manic symptoms, tangential grandiosity.  She needs inpatient hospitalization for medication management and close monitoring.  Patient is unable to make decisions on her own as she is unable to appreciate and understand her mental health history, the current need for treatment.  [facility] has screened her case and for further evaluation for legal guardianship. [facility] screened in her case. Sister and mom are involved in her care.  Treatment Plan Summary:  Safety and Monitoring: If patient meets criteria for nonemergent forced medication we will need to change her status to involuntary commitment             -- Voluntary admission to inpatient psychiatric unit for safety, stabilization and treatment             -- Daily contact with patient to assess and evaluate symptoms and progress in treatment             -- Patient's case  to be discussed in multi-disciplinary team meeting             -- Observation Level: q15 minute checks             -- Vital signs:  q12 hours             -- Precautions: suicide, elopement, and assault   2. Psychiatric Diagnoses and Treatment:   Clozaril  75 mg twice daily enforced with zyprexa  10mg  BID-plan to increase Clozaril  dose to 100mg  BID Cymbalta  40 mg daily   -- The risks/benefits/side-effects/alternatives to this medication were discussed in detail with the patient and time was given for questions. The patient consents to medication trial.                -- Metabolic profile and EKG monitoring obtained  while on an atypical antipsychotic (BMI: Lipid Panel: HbgA1c: QTc:)              -- Encouraged patient to participate in unit milieu and in scheduled group therapies                            3. Medical Issues Being Addressed:   4. Discharge Planning:   -- Social work and case management to assist with discharge planning and identification of hospital follow-up needs prior to discharge  -- Estimated LOS: 3-4 days  Allyn Foil, MD 06/30/2024, 12:35 PM

## 2024-06-30 NOTE — Group Note (Signed)
 Date:  06/30/2024 Time:  10:29 PM  Group Topic/Focus:  Self Care:   The focus of this group is to help patients understand the importance of self-care in order to improve or restore emotional, physical, spiritual, interpersonal, and financial health.    Participation Level:  Active  Participation Quality:  Appropriate  Affect:  Appropriate  Cognitive:  Appropriate  Insight: Appropriate  Engagement in Group:  Engaged  Modes of Intervention:  Orientation  Additional Comments:    Laymon ONEIDA Finder 06/30/2024, 10:29 PM

## 2024-07-01 DIAGNOSIS — F250 Schizoaffective disorder, bipolar type: Secondary | ICD-10-CM | POA: Diagnosis not present

## 2024-07-01 NOTE — Group Note (Signed)
"                                                 [facility] LCSW Group Therapy Note    Group Date: 07/01/2024 Start Time: 1328 End Time: 1358  Type of Therapy and Topic:  Group Therapy:  Overcoming Obstacles  Participation Level:  [facility] PARTICIPATION LEVEL: Active  Mood: Pleasant, talkative  Description of Group:   In this group patients will be encouraged to explore what they see as obstacles to their own wellness and recovery. They will be guided to discuss their thoughts, feelings, and behaviors related to these obstacles. The group will process together ways to cope with barriers, with attention given to specific choices patients can make. Each patient will be challenged to identify changes they are motivated to make in order to overcome their obstacles. This group will be process-oriented, with patients participating in exploration of their own experiences as well as giving and receiving support and challenge from other group members.  Therapeutic Goals: 1. Patient will identify personal and current obstacles as they relate to admission. 2. Patient will identify barriers that currently interfere with their wellness or overcoming obstacles.  3. Patient will identify feelings, thought process and behaviors related to these barriers. 4. Patient will identify two changes they are willing to make to overcome these obstacles:    Summary of Patient Progress  The facilitator and patient discussed things they could control and not control.  Group members create an individual statement on what they could not control.  The facilitator and patient examine how different experiences can influence their  mood.  The patient reflected on how their behaviors shape their current control and things that they can not control.  This distinction allows the patient to honor their limitations and channel their efforts where they will make a difference.    Therapeutic Modalities:   Cognitive Behavioral  Therapy Solution Focused Therapy Motivational Interviewing Relapse Prevention Therapy   Rexene LELON Mae, LCSWA "

## 2024-07-01 NOTE — Progress Notes (Signed)
 D:Alert and oriented. Flat affect. Pt remains delusional and grandiose. RIS. Visible in dayroom watching TV and working on crossword puzzles. Upon approach, pt is focused on discharge, repeatly stating she needs to get out of here. Pt redirected to the situation. Pt bedtime vital signs obtained at 2000: b/p 84/56, HR 90. Fluids encouraged and accepted. Recheck at 2200 b/p 81/62, HR 92. Current vital signs at 2300: b/p 88/51, HR 91.   A: fluids encouraged and accepted. Bobbitt/[place], NP notified of all vital signs. PO Ambien , Clozaril , and Propanolol held per instructions.  R: Pt remains asymptomatic. No s/s of acute distress noted. No c/o pain/discomfort noted. NNO received.   T: Pt encouraged to increase fluid intake and report any dizziness and lightheadedness.verbalized understanding.

## 2024-07-01 NOTE — Group Note (Signed)
 Date:  07/01/2024 Time:  10:35 AM  Group Topic/Focus:  Movement Therapy Morning strech with Lyna Laningham    Participation Level:  Did Not Attend    [name] 07/01/2024, 10:35 AM

## 2024-07-01 NOTE — Group Note (Signed)
 Date:  07/01/2024 Time:  10:22 PM  Group Topic/Focus:  Identifying Needs:   The focus of this group is to help patients identify their personal needs that have been historically problematic and identify healthy behaviors to address their needs.    Participation Level:  Active  Participation Quality:  Appropriate and Intrusive  Affect:  Appropriate  Cognitive:  Appropriate  Insight: Appropriate  Engagement in Group:  Engaged  Modes of Intervention:  Discussion  Additional Comments:    Valerie [name] 07/01/2024, 10:22 PM

## 2024-07-01 NOTE — Plan of Care (Signed)
  Problem: Education: Goal: Emotional status will improve Outcome: Not Progressing Goal: Mental status will improve Outcome: Not Progressing Goal: Verbalization of understanding the information provided will improve Outcome: Not Progressing   Problem: Coping: Goal: Ability to verbalize frustrations and anger appropriately will improve Outcome: Not Progressing Goal: Ability to demonstrate self-control will improve Outcome: Not Progressing

## 2024-07-01 NOTE — Group Note (Signed)
 Date:  07/01/2024 Time:  4:34 PM  Group Topic/Focus:  Managing Feelings:   The focus of this group is to identify what feelings patients have difficulty handling and develop a plan to handle them in a healthier way upon discharge.    Participation Level:  None   Additional Comments:  Pt very distracting and angry during group. Pt excused herself at the beginning of group when she asked someone to make her bed because that's not my job and staff explained independency to her. Pt returned to group and would yell out things that did not make sense.   Maglione,Tranise Forrest E 07/01/2024, 4:34 PM

## 2024-07-01 NOTE — Progress Notes (Signed)
" °   07/01/24 1300  Psych Admission Type (Psych Patients Only)  Admission Status Voluntary  Psychosocial Assessment  Patient Complaints Anxiety;Irritability;Restlessness  Eye Contact Fair  Facial Expression Animated  Affect Labile  Speech Tangential  Interaction Assertive;Arrogant  Motor Activity Slow  Appearance/Hygiene Unremarkable  Behavior Characteristics Anxious;Restless  Mood Labile  Thought Process  Coherency Disorganized  Content Religiosity;Delusions  Delusions Paranoid;Religious;Grandeur  Perception Hallucinations  Hallucination Auditory  Judgment Impaired  Confusion Mild  Danger to Self  Current suicidal ideation? Denies    "

## 2024-07-01 NOTE — Progress Notes (Signed)
 Pt alert and oriented with confusion. Affect labile. Pt demonstrates fluctuating mood. Rhyming speech. Following medication administration, pt present to nurses station said I'm happy, happy...are you happy? I'm happy because I can't wait to get out of here! Support and reassurance provided. Interacting with peers and staff. Attends group. Po med compliant. C/o indigestion. PRN given for indigestion. Support and reassurance provided. Q15 min checks maintained for safety. No behavior issues noted. Denies SI/HI/AVH. Cont POC.    06/30/24 2100  Psych Admission Type (Psych Patients Only)  Admission Status Voluntary  Psychosocial Assessment  Patient Complaints Anxiety;Restlessness  Eye Contact Fair  Facial Expression Animated  Affect Labile  Speech Rhyming  Interaction Assertive  Motor Activity Slow  Appearance/Hygiene Unremarkable  Behavior Characteristics Anxious;Restless  Mood Labile  Thought Process  Coherency Disorganized  Content Paranoia;Religiosity  Delusions Paranoid  Perception Hallucinations  Hallucination Auditory  Judgment Impaired  Confusion Mild  Danger to Self  Current suicidal ideation? Denies

## 2024-07-01 NOTE — Plan of Care (Signed)
  Problem: Education: Goal: Ability to state activities that reduce stress will improve Outcome: Progressing   Problem: Self-Concept: Goal: Ability to identify factors that promote anxiety will improve Outcome: Progressing Goal: Level of anxiety will decrease Outcome: Progressing Goal: Ability to modify response to factors that promote anxiety will improve Outcome: Progressing   

## 2024-07-02 DIAGNOSIS — F250 Schizoaffective disorder, bipolar type: Secondary | ICD-10-CM | POA: Diagnosis not present

## 2024-07-02 NOTE — Progress Notes (Signed)
 [facility] MD Progress Note  07/02/2024 10:53 AM Valerie [name]  MRN:  [id number]   Valerie [name] is a 63 year old female presenting voluntary to [facility] requesting a psychiatric evaluation for worsening psychosis. Patient has history of schizophrenia. Patient denied SI, HI, psychosis and alcohol/drug usage. Patient resides at [facility]. Patient reports people are coming by my door talking loud, yelling and screaming and coming in my room with keys. Patient reports having multiple medical Afibs and blood clots due to this. Patient reports people are doing things in the dining room. When asked if she called EMS, patient stated I called EMS, I stripped searched over the phone and called the authorities. Patient is admitted to [facility] unit with Q15 min safety monitoring. Multidisciplinary team approach is offered. Medication management; group/milieu therapy is offered.   Subjective:  Chart reviewed, case discussed in multidisciplinary meeting, patient seen during rounds.  Patient is less paranoid today able to engage in a better conversation no aggressive behaviors.   Past Psychiatric History: see h&P Family History:  Family History  Problem Relation Age of Onset   Colon cancer Neg Hx    Celiac disease Neg Hx    Inflammatory bowel disease Neg Hx    Social History:  Social History   Substance and Sexual Activity  Alcohol Use Never     Social History   Substance and Sexual Activity  Drug Use Never    Social History   Socioeconomic History   Marital status: Single    Spouse name: Not on file   Number of children: Not on file   Years of education: Not on file   Highest education level: Not on file  Occupational History   Not on file  Tobacco Use   Smoking status: Never   Smokeless tobacco: Never  Substance and Sexual Activity   Alcohol use: Never   Drug use: Never   Sexual activity: Not Currently    Birth control/protection: Pill  Other Topics  Concern   Not on file  Social History Narrative   Not on file   Social Drivers of Health   Tobacco Use: Low Risk (06/19/2024)   Patient History    Smoking Tobacco Use: Never    Smokeless Tobacco Use: Never    Passive Exposure: Not on file  Financial Resource Strain: Not on file  Food Insecurity: Unknown (06/19/2024)   Epic    Worried About Programme Researcher, Broadcasting/film/video in the Last Year: Never true    [employer] of Food in the Last Year: Patient declined  Transportation Needs: No Transportation Needs (06/19/2024)   Epic    Lack of Transportation (Medical): No    Lack of Transportation (Non-Medical): No  Physical Activity: Not on file  Stress: Not on file  Social Connections: Not on file  Depression (PHQ2-9): Low Risk (12/21/2022)   Depression (PHQ2-9)    PHQ-2 Score: 0  Alcohol Screen: Low Risk (06/19/2024)   Alcohol Screen    Last Alcohol Screening Score (AUDIT): 0  Housing: Low Risk (06/19/2024)   Epic    Unable to Pay for Housing in the Last Year: No    Number of Times Moved in the Last Year: 0    Homeless in the Last Year: No  Utilities: Not At Risk (06/19/2024)   Epic    Threatened with loss of utilities: No  Health Literacy: Not on file   Past Medical History:  Past Medical History:  Diagnosis Date   Chronic  lower extremity pain (2ry area of Pain) (Right) 12/21/2022   Chronic pain syndrome 12/20/2022   DDD (degenerative disc disease), 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 Tobie: Pancolonic diverticulosis, tortuous colon, next colonoscopy 2024   TONSILLECTOMY      Current Medications: Current Facility-Administered Medications  Medication Dose Route Frequency Provider Last Rate Last Admin   acetaminophen  (TYLENOL ) tablet 650 mg  650 mg Oral Q6H PRN Onuoha, Chinwendu V, NP   650 mg at 07/02/24 0559   alum & mag hydroxide-simeth  (MAALOX/MYLANTA) 200-200-20 MG/5ML suspension 30 mL  30 mL Oral Q4H PRN Bobbitt, Shalon E, NP   30 mL at 07/02/24 0316   amLODipine  (NORVASC ) tablet 5 mg  5 mg Oral Daily Jadapalle, Sree, MD   5 mg at 07/01/24 1004   aspirin  EC tablet 81 mg  81 mg Oral Daily Bobbitt, Shalon E, NP   81 mg at 07/02/24 1013   bisacodyl  (DULCOLAX) EC tablet 10 mg  10 mg Oral Daily PRN Jadapalle, Sree, MD       cholecalciferol  (VITAMIN D3) 25 MCG (1000 UNIT) tablet 1,000 Units  1,000 Units Oral Weekly Jadapalle, Sree, MD   1,000 Units at 06/27/24 1251   cloZAPine  (CLOZARIL ) tablet 75 mg  75 mg Oral BID Jadapalle, Sree, MD   75 mg at 07/02/24 1010   Or   OLANZapine  (ZYPREXA ) injection 10 mg  10 mg Intramuscular BID Jadapalle, Sree, MD       diclofenac  Sodium (VOLTAREN ) 1 % topical gel 4 g  4 g Topical QID Jadapalle, Sree, MD   4 g at 07/01/24 2243   divalproex  (DEPAKOTE  ER) 24 hr tablet 500 mg  500 mg Oral Daily Jadapalle, Sree, MD       DULoxetine  (CYMBALTA ) DR capsule 40 mg  40 mg Oral Daily Jadapalle, Sree, MD   40 mg at 07/02/24 1015   famotidine  (PEPCID ) tablet 20 mg  20 mg Oral Daily Jadapalle, Sree, MD   20 mg at 07/02/24 1011   fluticasone  (FLONASE ) 50 MCG/ACT nasal spray 1 spray  1 spray Each Nare Daily Bobbitt, Shalon E, NP       gabapentin  (NEURONTIN ) capsule 300 mg  300 mg Oral TID Bobbitt, Shalon E, NP   300 mg at 07/02/24 1014   hydrochlorothiazide  (HYDRODIURIL ) tablet 25 mg  25 mg Oral Daily Jadapalle, Sree, MD   25 mg at 07/01/24 1005   hydrOXYzine  (ATARAX ) tablet 25 mg  25 mg Oral TID PRN Bobbitt, Shalon E, NP   25 mg at 06/26/24 0731   linagliptin  (TRADJENTA ) tablet 5 mg  5 mg Oral Daily Bobbitt, Shalon E, NP   5 mg at 07/02/24 1014   lisinopril  (ZESTRIL ) tablet 30 mg  30 mg Oral Daily Bobbitt, Shalon E, NP   30 mg at 07/01/24 1011   magnesium  hydroxide (MILK OF MAGNESIA) suspension 30 mL  30 mL Oral Daily PRN Bobbitt, Shalon E, NP       metFORMIN  (GLUCOPHAGE ) tablet 1,000 mg  1,000 mg Oral Q supper  Bobbitt, Shalon E, NP   1,000 mg at 07/01/24 1638   multivitamin with minerals tablet 1 tablet  1 tablet Oral Daily Bobbitt, Shalon E, NP   1 tablet at 07/02/24 1015   naproxen  (NAPROSYN ) tablet 375 mg  375 mg Oral TID WC Jadapalle,  Sree, MD   375 mg at 07/02/24 0801   OLANZapine  (ZYPREXA ) injection 5 mg  5 mg Intramuscular TID PRN Bobbitt, Shalon E, NP   5 mg at 06/22/24 2259   OLANZapine  zydis (ZYPREXA ) disintegrating tablet 5 mg  5 mg Oral TID PRN Bobbitt, Shalon E, NP   5 mg at 06/26/24 1024   ondansetron  (ZOFRAN ) tablet 4 mg  4 mg Oral Q8H PRN Bobbitt, Shalon E, NP       potassium chloride  SA (KLOR-CON  M) CR tablet 20 mEq  20 mEq Oral BID Bobbitt, Shalon E, NP   20 mEq at 07/02/24 1014   propranolol  (INDERAL ) tablet 40 mg  40 mg Oral BID Bobbitt, Shalon E, NP   40 mg at 07/01/24 1004   rosuvastatin  (CRESTOR ) tablet 5 mg  5 mg Oral Daily Bobbitt, Shalon E, NP   5 mg at 07/02/24 1011   traZODone  (DESYREL ) tablet 50 mg  50 mg Oral QHS PRN Bobbitt, Shalon E, NP       zolpidem  (AMBIEN ) tablet 10 mg  10 mg Oral QHS Jadapalle, Sree, MD   10 mg at 06/30/24 2213    Lab Results:  No results found for this or any previous visit (from the past 48 hours).     Blood Alcohol level:  Lab Results  Component Value Date   [facility] <15 06/18/2024    Metabolic Disorder Labs: Lab Results  Component Value Date   HGBA1C 5.9 (H) 06/28/2024   MPG 122.63 06/28/2024   No results found for: PROLACTIN Lab Results  Component Value Date   CHOL 156 06/28/2024   TRIG 89 06/28/2024   HDL 70 06/28/2024   CHOLHDL 2.2 06/28/2024   VLDL 18 06/28/2024   LDLCALC 69 06/28/2024    Physical Findings: AIMS:  , ,  ,  ,    CIWA:    COWS:      Psychiatric Specialty Exam:  Presentation  General Appearance:  Casual  Eye Contact: Fleeting  Speech: Normal Rate  Speech Volume: Increased    Mood and Affect  Mood: Euphoric  Affect: Labile   Thought Process  Thought  Processes: Disorganized  Orientation:Full (Time, Place and Person)  Thought Content:Illogical; Delusions; Tangential  Hallucinations: Denies  Ideas of Reference:Delusions  Suicidal Thoughts: Denies  Homicidal Thoughts: Denies   Sensorium  Memory: Immediate Fair; Remote Fair  Judgment: Impaired  Insight: Shallow   Executive Functions  Concentration: Poor  Attention Span: Poor  Recall: [employer] of Knowledge: Fair  Language: Fair   Psychomotor Activity  Psychomotor Activity: No data recorded  Musculoskeletal: Strength & Muscle Tone: within normal limits Gait & Station: normal Assets  Assets: Manufacturing Systems Engineer; Desire for Improvement    Physical Exam: Physical Exam Vitals and nursing note reviewed.    ROS Blood pressure 102/79, pulse (!) 101, temperature (!) 97.3 F (36.3 C), resp. rate 18, height 5' 5 (1.651 m), weight 81 kg, SpO2 100%. Body mass index is 29.7 kg/m.  Diagnosis: Active Problems:   Schizoaffective disorder, bipolar type [facility])  Clinical Decision Making: Patient is currently admitted for worsening psychosis, manic symptoms, tangential grandiosity.  She needs inpatient hospitalization for medication management and close monitoring.  Patient is unable to make decisions on her own as she is unable to appreciate and understand her mental health history, the current need for treatment.  [facility] has screened her case and for further evaluation for legal guardianship. APS screened in her case. Sister and mom are involved in her  care.  Treatment Plan Summary:  Safety and Monitoring: If patient meets criteria for nonemergent forced medication we will need to change her status to involuntary commitment             -- Voluntary admission to inpatient psychiatric unit for safety, stabilization and treatment             -- Daily contact with patient to assess and evaluate symptoms and progress in treatment             -- Patient's case to be  discussed in multi-disciplinary team meeting             -- Observation Level: q15 minute checks             -- Vital signs:  q12 hours             -- Precautions: suicide, elopement, and assault   2. Psychiatric Diagnoses and Treatment:   Clozaril  75 mg twice daily enforced with zyprexa  10mg  BID-plan to increase Clozaril  dose to 100mg  BID Cymbalta  40 mg daily   -- The risks/benefits/side-effects/alternatives to this medication were discussed in detail with the patient and time was given for questions. The patient consents to medication trial.                -- Metabolic profile and EKG monitoring obtained while on an atypical antipsychotic (BMI: Lipid Panel: HbgA1c: QTc:)              -- Encouraged patient to participate in unit milieu and in scheduled group therapies                            3. Medical Issues Being Addressed:   4. Discharge Planning:   -- Social work and case management to assist with discharge planning and identification of hospital follow-up needs prior to discharge  -- Estimated LOS: 3-4 days  Millie JONELLE Manners, MD 07/02/2024, 10:53 AM

## 2024-07-02 NOTE — Group Note (Signed)
 Date:  07/02/2024 Time:  10:37 AM  Group Topic/Focus:  Movement Therapy, Morning Stretch with Reina Wilton.    Participation Level:  Did Not Attend    [name] 07/02/2024, 10:37 AM

## 2024-07-02 NOTE — Progress Notes (Signed)
" °   07/01/24 2100  Psych Admission Type (Psych Patients Only)  Admission Status Voluntary  Psychosocial Assessment  Patient Complaints Suspiciousness;Anxiety  Eye Contact Fair  Facial Expression Anxious  Affect Preoccupied  Speech Tangential  Interaction Assertive  Motor Activity Slow  Appearance/Hygiene In scrubs;Disheveled  Behavior Characteristics Restless  Mood Preoccupied  Thought Ship Broker  Content Religiosity;Delusions  Delusions Religious;Grandeur  Perception Hallucinations  Hallucination Auditory  Judgment Impaired  Confusion Mild  Danger to Self  Current suicidal ideation? Denies    "

## 2024-07-02 NOTE — Group Note (Signed)
 Date:  07/02/2024 Time:  11:16 PM  Group Topic/Focus:  Wrap-Up Group:   The focus of this group is to help patients review their daily goal of treatment and discuss progress on daily workbooks.    Participation Level:  Active  Participation Quality:  Appropriate  Affect:  Appropriate  Cognitive:  Alert  Insight: Appropriate  Engagement in Group:  Engaged  Modes of Intervention:  Discussion  Additional Comments:    Valerie [name] Bunker 07/02/2024, 11:16 PM

## 2024-07-02 NOTE — Plan of Care (Signed)
  Problem: Coping: Goal: Ability to demonstrate self-control will improve Outcome: Progressing   Problem: Education: Goal: Emotional status will improve Outcome: Not Progressing Goal: Mental status will improve Outcome: Not Progressing   Problem: Activity: Goal: Interest or engagement in activities will improve Outcome: Not Progressing

## 2024-07-02 NOTE — Progress Notes (Signed)
 [facility] MD Progress Note  07/02/2024 8:45 AM Valerie [name]  MRN:  [id number]   Valerie [name] is a 63 year old female presenting voluntary to [facility] requesting a psychiatric evaluation for worsening psychosis. Patient has history of schizophrenia. Patient denied SI, HI, psychosis and alcohol/drug usage. Patient resides at [facility]. Patient reports people are coming by my door talking loud, yelling and screaming and coming in my room with keys. Patient reports having multiple medical Afibs and blood clots due to this. Patient reports people are doing things in the dining room. When asked if she called EMS, patient stated I called EMS, I stripped searched over the phone and called the authorities. Patient is admitted to [facility] unit with Q15 min safety monitoring. Multidisciplinary team approach is offered. Medication management; group/milieu therapy is offered.   Subjective:  Chart reviewed, case discussed in multidisciplinary meeting, patient seen during rounds.     She continues to talk about her new husband coming to get her and them going to live in [place] and have family.  She lacks insight into the need of medications for her mental health problems.  She continues to claim medications or like toxic material.  She denies SI/HI/plan and denies hallucinations.   Past Psychiatric History: see h&P Family History:  Family History  Problem Relation Age of Onset   Colon cancer Neg Hx    Celiac disease Neg Hx    Inflammatory bowel disease Neg Hx    Social History:  Social History   Substance and Sexual Activity  Alcohol Use Never     Social History   Substance and Sexual Activity  Drug Use Never    Social History   Socioeconomic History   Marital status: Single    Spouse name: Not on file   Number of children: Not on file   Years of education: Not on file   Highest education level: Not on file  Occupational History   Not on file  Tobacco  Use   Smoking status: Never   Smokeless tobacco: Never  Substance and Sexual Activity   Alcohol use: Never   Drug use: Never   Sexual activity: Not Currently    Birth control/protection: Pill  Other Topics Concern   Not on file  Social History Narrative   Not on file   Social Drivers of Health   Tobacco Use: Low Risk (06/19/2024)   Patient History    Smoking Tobacco Use: Never    Smokeless Tobacco Use: Never    Passive Exposure: Not on file  Financial Resource Strain: Not on file  Food Insecurity: Unknown (06/19/2024)   Epic    Worried About Programme Researcher, Broadcasting/film/video in the Last Year: Never true    [employer] of Food in the Last Year: Patient declined  Transportation Needs: No Transportation Needs (06/19/2024)   Epic    Lack of Transportation (Medical): No    Lack of Transportation (Non-Medical): No  Physical Activity: Not on file  Stress: Not on file  Social Connections: Not on file  Depression (PHQ2-9): Low Risk (12/21/2022)   Depression (PHQ2-9)    PHQ-2 Score: 0  Alcohol Screen: Low Risk (06/19/2024)   Alcohol Screen    Last Alcohol Screening Score (AUDIT): 0  Housing: Low Risk (06/19/2024)   Epic    Unable to Pay for Housing in the Last Year: No    Number of Times Moved in the Last Year: 0    Homeless in the Last  Year: No  Utilities: Not At Risk (06/19/2024)   Epic    Threatened with loss of utilities: No  Health Literacy: Not on file   Past Medical History:  Past Medical History:  Diagnosis Date   Chronic lower extremity pain (2ry area of Pain) (Right) 12/21/2022   Chronic pain syndrome 12/20/2022   DDD (degenerative disc disease), 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 Tobie: Pancolonic diverticulosis, tortuous colon, next colonoscopy 2024   TONSILLECTOMY      Current Medications: Current Facility-Administered  Medications  Medication Dose Route Frequency Provider Last Rate Last Admin   acetaminophen  (TYLENOL ) tablet 650 mg  650 mg Oral Q6H PRN Onuoha, Chinwendu V, NP   650 mg at 07/02/24 0559   alum & mag hydroxide-simeth (MAALOX/MYLANTA) 200-200-20 MG/5ML suspension 30 mL  30 mL Oral Q4H PRN Bobbitt, Shalon E, NP   30 mL at 07/02/24 0316   amLODipine  (NORVASC ) tablet 5 mg  5 mg Oral Daily Jadapalle, Sree, MD   5 mg at 07/01/24 1004   aspirin  EC tablet 81 mg  81 mg Oral Daily Bobbitt, Shalon E, NP   81 mg at 07/01/24 1005   bisacodyl  (DULCOLAX) EC tablet 10 mg  10 mg Oral Daily PRN Jadapalle, Sree, MD       cholecalciferol  (VITAMIN D3) 25 MCG (1000 UNIT) tablet 1,000 Units  1,000 Units Oral Weekly Jadapalle, Sree, MD   1,000 Units at 06/27/24 1251   cloZAPine  (CLOZARIL ) tablet 75 mg  75 mg Oral BID Jadapalle, Sree, MD   75 mg at 07/01/24 1000   Or   OLANZapine  (ZYPREXA ) injection 10 mg  10 mg Intramuscular BID Jadapalle, Sree, MD       diclofenac  Sodium (VOLTAREN ) 1 % topical gel 4 g  4 g Topical QID Donnelly Mellow, MD   4 g at 07/01/24 2243   divalproex  (DEPAKOTE  ER) 24 hr tablet 500 mg  500 mg Oral Daily Jadapalle, Sree, MD       DULoxetine  (CYMBALTA ) DR capsule 40 mg  40 mg Oral Daily Jadapalle, Sree, MD   40 mg at 07/01/24 1000   famotidine  (PEPCID ) tablet 20 mg  20 mg Oral Daily Jadapalle, Sree, MD   20 mg at 07/01/24 1000   fluticasone  (FLONASE ) 50 MCG/ACT nasal spray 1 spray  1 spray Each Nare Daily Bobbitt, Shalon E, NP       gabapentin  (NEURONTIN ) capsule 300 mg  300 mg Oral TID Bobbitt, Shalon E, NP   300 mg at 07/01/24 2242   hydrochlorothiazide  (HYDRODIURIL ) tablet 25 mg  25 mg Oral Daily Jadapalle, Sree, MD   25 mg at 07/01/24 1005   hydrOXYzine  (ATARAX ) tablet 25 mg  25 mg Oral TID PRN Bobbitt, Shalon E, NP   25 mg at 06/26/24 0731   linagliptin  (TRADJENTA ) tablet 5 mg  5 mg Oral Daily Bobbitt, Shalon E, NP   5 mg at 07/01/24 1005   lisinopril  (ZESTRIL ) tablet 30 mg  30 mg Oral Daily  Bobbitt, Shalon E, NP   30 mg at 07/01/24 1011   magnesium  hydroxide (MILK OF MAGNESIA) suspension 30 mL  30 mL Oral Daily PRN Bobbitt, Shalon E, NP       metFORMIN  (GLUCOPHAGE ) tablet 1,000 mg  1,000 mg Oral Q supper Bobbitt, Shalon E, NP   1,000 mg  at 07/01/24 1638   multivitamin with minerals tablet 1 tablet  1 tablet Oral Daily Bobbitt, Shalon E, NP   1 tablet at 07/01/24 1004   naproxen  (NAPROSYN ) tablet 375 mg  375 mg Oral TID WC Jadapalle, Sree, MD   375 mg at 07/02/24 0801   OLANZapine  (ZYPREXA ) injection 5 mg  5 mg Intramuscular TID PRN Bobbitt, Shalon E, NP   5 mg at 06/22/24 2259   OLANZapine  zydis (ZYPREXA ) disintegrating tablet 5 mg  5 mg Oral TID PRN Bobbitt, Shalon E, NP   5 mg at 06/26/24 1024   ondansetron  (ZOFRAN ) tablet 4 mg  4 mg Oral Q8H PRN Bobbitt, Shalon E, NP       potassium chloride  SA (KLOR-CON  M) CR tablet 20 mEq  20 mEq Oral BID Bobbitt, Shalon E, NP   20 mEq at 07/01/24 2242   propranolol  (INDERAL ) tablet 40 mg  40 mg Oral BID Bobbitt, Shalon E, NP   40 mg at 07/01/24 1004   rosuvastatin  (CRESTOR ) tablet 5 mg  5 mg Oral Daily Bobbitt, Shalon E, NP   5 mg at 07/01/24 1011   traZODone  (DESYREL ) tablet 50 mg  50 mg Oral QHS PRN Bobbitt, Shalon E, NP       zolpidem  (AMBIEN ) tablet 10 mg  10 mg Oral QHS Jadapalle, Sree, MD   10 mg at 06/30/24 2213    Lab Results:  No results found for this or any previous visit (from the past 48 hours).     Blood Alcohol level:  Lab Results  Component Value Date   [facility] <15 06/18/2024    Metabolic Disorder Labs: Lab Results  Component Value Date   HGBA1C 5.9 (H) 06/28/2024   MPG 122.63 06/28/2024   No results found for: PROLACTIN Lab Results  Component Value Date   CHOL 156 06/28/2024   TRIG 89 06/28/2024   HDL 70 06/28/2024   CHOLHDL 2.2 06/28/2024   VLDL 18 06/28/2024   LDLCALC 69 06/28/2024    Physical Findings: AIMS:  , ,  ,  ,    CIWA:    COWS:      Psychiatric Specialty Exam:  Presentation  General  Appearance:  Casual  Eye Contact: Fleeting  Speech: Normal Rate  Speech Volume: Increased    Mood and Affect  Mood: Euphoric  Affect: Labile   Thought Process  Thought Processes: Disorganized  Orientation:Full (Time, Place and Person)  Thought Content:Illogical; Delusions; Tangential  Hallucinations: Denies  Ideas of Reference:Delusions  Suicidal Thoughts: Denies  Homicidal Thoughts: Denies   Sensorium  Memory: Immediate Fair; Remote Fair  Judgment: Impaired  Insight: Shallow   Executive Functions  Concentration: Poor  Attention Span: Poor  Recall: [employer] of Knowledge: Fair  Language: Fair   Psychomotor Activity  Psychomotor Activity: No data recorded  Musculoskeletal: Strength & Muscle Tone: within normal limits Gait & Station: normal Assets  Assets: Manufacturing Systems Engineer; Desire for Improvement    Physical Exam: Physical Exam Vitals and nursing note reviewed.    ROS Blood pressure 112/65, pulse 90, temperature (!) 97.3 F (36.3 C), resp. rate 18, height 5' 5 (1.651 m), weight 81 kg, SpO2 100%. Body mass index is 29.7 kg/m.  Diagnosis: Active Problems:   Schizoaffective disorder, bipolar type [facility])  Clinical Decision Making: Patient is currently admitted for worsening psychosis, manic symptoms, tangential grandiosity.  She needs inpatient hospitalization for medication management and close monitoring.  Patient is unable to make decisions on her own as she  is unable to appreciate and understand her mental health history, the current need for treatment.  [facility] has screened her case and for further evaluation for legal guardianship. APS screened in her case. Sister and mom are involved in her care.  Treatment Plan Summary:  Safety and Monitoring: If patient meets criteria for nonemergent forced medication we will need to change her status to involuntary commitment             -- Voluntary admission to inpatient  psychiatric unit for safety, stabilization and treatment             -- Daily contact with patient to assess and evaluate symptoms and progress in treatment             -- Patient's case to be discussed in multi-disciplinary team meeting             -- Observation Level: q15 minute checks             -- Vital signs:  q12 hours             -- Precautions: suicide, elopement, and assault   2. Psychiatric Diagnoses and Treatment:   Clozaril  75 mg twice daily enforced with zyprexa  10mg  BID-plan to increase Clozaril  dose to 100mg  BID Cymbalta  40 mg daily   -- The risks/benefits/side-effects/alternatives to this medication were discussed in detail with the patient and time was given for questions. The patient consents to medication trial.                -- Metabolic profile and EKG monitoring obtained while on an atypical antipsychotic (BMI: Lipid Panel: HbgA1c: QTc:)              -- Encouraged patient to participate in unit milieu and in scheduled group therapies                            3. Medical Issues Being Addressed:   4. Discharge Planning:   -- Social work and case management to assist with discharge planning and identification of hospital follow-up needs prior to discharge  -- Estimated LOS: 3-4 days  Millie JONELLE Manners, MD 07/02/2024, 8:45 AM

## 2024-07-02 NOTE — Progress Notes (Signed)
" °   07/02/24 1100  Psych Admission Type (Psych Patients Only)  Admission Status Voluntary  Psychosocial Assessment  Patient Complaints Anxiety;Suspiciousness  Eye Contact Fair  Facial Expression Animated;Anxious  Affect Labile  Speech Tangential  Interaction Assertive  Motor Activity Slow  Appearance/Hygiene Unremarkable  Behavior Characteristics Anxious;Restless  Mood Labile  Thought Process  Coherency Tangential  Content Religiosity;Delusions  Delusions Grandeur;Religious  Perception Hallucinations  Hallucination Auditory  Judgment Impaired  Confusion Mild  Danger to Self  Current suicidal ideation? Denies    "

## 2024-07-02 NOTE — Plan of Care (Signed)
  Problem: Education: Goal: Ability to state activities that reduce stress will improve Outcome: Progressing   Problem: Self-Concept: Goal: Ability to identify factors that promote anxiety will improve Outcome: Progressing Goal: Level of anxiety will decrease Outcome: Progressing Goal: Ability to modify response to factors that promote anxiety will improve Outcome: Progressing   

## 2024-07-02 NOTE — Group Note (Signed)
 Date:  07/02/2024 Time:  4:27 PM  Group Topic/Focus:  Wellness Toolbox:   The focus of this group is to discuss various aspects of wellness, balancing those aspects and exploring ways to increase the ability to experience wellness.  Patients will create a wellness toolbox for use upon discharge.    Participation Level:  Minimal  Participation Quality:  Intrusive and Monopolizing  Affect:  Irritable and Not Congruent  Cognitive:  Delusional  Insight: Limited  Engagement in Group:  Lacking  Modes of Intervention:  Activity and Discussion  Additional Comments:     [name],Valerie Salmons E 07/02/2024, 4:27 PM

## 2024-07-03 DIAGNOSIS — F250 Schizoaffective disorder, bipolar type: Secondary | ICD-10-CM | POA: Diagnosis not present

## 2024-07-03 MED ORDER — OLANZAPINE 10 MG IM SOLR
10.0000 mg | Freq: Two times a day (BID) | INTRAMUSCULAR | Status: DC
  Administered 2024-07-05: 10 mg via INTRAMUSCULAR
  Filled 2024-07-03: qty 10

## 2024-07-03 MED ORDER — CLOZAPINE 100 MG PO TABS
100.0000 mg | ORAL_TABLET | Freq: Two times a day (BID) | ORAL | Status: DC
  Administered 2024-07-04 – 2024-07-26 (×43): 100 mg via ORAL
  Filled 2024-07-03 (×33): qty 1

## 2024-07-03 NOTE — Group Note (Signed)
 Physical/Occupational Therapy Group Note  Group Topic: Transfer Training   Group Date: 07/03/2024 Start Time: 1315 End Time: 1340 Facilitators: Louanna Vanliew, Alm Hamilton, PT   Group Description: Group educated on sequence and techniques to maximize safety with functional transfers.  Additionally, integrated education on impact of seating surfaces, use of assistive device and management of orthostasis with movement transitions.  Patients actively engaged with functional transfers (sit/stand) from various seating surfaces, with and without assist devices, working to integrate and retain education provided during session.  Allowed time for questions and further discussion on mobility concerns/needs.   Therapeutic Goal(s):  Identify and demonstrate safe technique for sit/stand transfers from various seating surfaces. Identify and demonstrate safe use of assistive devices with basic transfers and simple mobility. Identify and demonstrate ability to recognize signs/symptoms of orthostasis and appropriate compensatory/safety techniques.  Individual Participation: Pt actively participated with the discussion and physical activity components of the session.  Pt was generally steady with standing therex/balance activities.  Participation Level: Active and Engaged   Participation Quality: Minimal Cues   Behavior: Appropriate   Speech/Thought Process: Tangential    Affect/Mood: Happy   Insight: Moderate   Judgement: Moderate   Individualization: Per above   Modes of Intervention: Activity, Discussion, and Education  Patient Response to Interventions:  Attentive, Engaged, Interested , and Receptive   Plan: Continue to engage patient in PT/OT groups 1 - 2x/week.  CHARM Hamilton Bertin PT, DPT 07/03/2024, 2:01 PM

## 2024-07-03 NOTE — Progress Notes (Signed)
" °   07/03/24 1500  Psych Admission Type (Psych Patients Only)  Admission Status Voluntary  Psychosocial Assessment  Patient Complaints Restlessness;Suspiciousness;Anxiety  Eye Contact Fair  Facial Expression Animated  Affect Labile  Speech Argumentative  Interaction Manipulative;Sarcastic;Intrusive;Demanding  Motor Activity Restless  Appearance/Hygiene Unremarkable  Behavior Characteristics Anxious;Intrusive  Mood Labile  Thought Process  Coherency Tangential  Content Blaming others;Confabulation;Delusions;Preoccupation;Religiosity  Delusions Grandeur;Religious  Perception Hallucinations  Hallucination Auditory  Judgment Impaired  Confusion Mild  Danger to Self  Current suicidal ideation? Denies (DENIES)  Danger to Others  Danger to Others None reported or observed    "

## 2024-07-03 NOTE — Progress Notes (Signed)
 [facility] MD Progress Note  07/03/2024 12:43 PM Yarden Manuelito  MRN:  [id number]   Luddie Boghosian is a 63 year old female presenting voluntary to [facility] requesting a psychiatric evaluation for worsening psychosis. Patient has history of schizophrenia. Patient denied SI, HI, psychosis and alcohol/drug usage. Patient resides at [facility]. Patient reports people are coming by my door talking loud, yelling and screaming and coming in my room with keys. Patient reports having multiple medical Afibs and blood clots due to this. Patient reports people are doing things in the dining room. When asked if she called EMS, patient stated I called EMS, I stripped searched over the phone and called the authorities. Patient is admitted to [facility] unit with Q15 min safety monitoring. Multidisciplinary team approach is offered. Medication management; group/milieu therapy is offered.   Subjective:  Chart reviewed, case discussed in multidisciplinary meeting, patient seen during rounds.  Patient is noted to be sitting in the hallway and stopping the provider multiple times.  She remains delusional and manic.  She reports that she got intoxicated with Zyprexa  again referring to the enforced medication she received last night from the nursing as she continues to refuse to take her medications.  She is unable to identify the reason for medications and the seriousness of her mental health problems.  She denies SI/HI/plan and denies hallucinations.  Able to understand medical problem-NO  Able to understand proposed treatment -NO   Able to understand alternative to proposed treatment (if any) -NO   Able to understand option of refusing proposed treatment (including withholding or withdrawing proposed treatment)-NO   Able to appreciate reasonably foreseeable consequences of accepting proposed treatment -NO  After thorough assessment, it is our clinical opinion-  Capacity is not  competency. Competency is determined by legal system and judge. Capacity can vary from time to time depending on the mental status of the patient.  Past Psychiatric History: see h&P Family History:  Family History  Problem Relation Age of Onset   Colon cancer Neg Hx    Celiac disease Neg Hx    Inflammatory bowel disease Neg Hx    Social History:  Social History   Substance and Sexual Activity  Alcohol Use Never     Social History   Substance and Sexual Activity  Drug Use Never    Social History   Socioeconomic History   Marital status: Single    Spouse name: Not on file   Number of children: Not on file   Years of education: Not on file   Highest education level: Not on file  Occupational History   Not on file  Tobacco Use   Smoking status: Never   Smokeless tobacco: Never  Substance and Sexual Activity   Alcohol use: Never   Drug use: Never   Sexual activity: Not Currently    Birth control/protection: Pill  Other Topics Concern   Not on file  Social History Narrative   Not on file   Social Drivers of Health   Tobacco Use: Low Risk (06/19/2024)   Patient History    Smoking Tobacco Use: Never    Smokeless Tobacco Use: Never    Passive Exposure: Not on file  Financial Resource Strain: Not on file  Food Insecurity: Unknown (06/19/2024)   Epic    Worried About Programme Researcher, Broadcasting/film/video in the Last Year: Never true    Ran Out of Food in the Last Year: Patient declined  Transportation Needs: No Transportation Needs (06/19/2024)  Epic    Lack of Transportation (Medical): No    Lack of Transportation (Non-Medical): No  Physical Activity: Not on file  Stress: Not on file  Social Connections: Not on file  Depression (PHQ2-9): Low Risk (12/21/2022)   Depression (PHQ2-9)    PHQ-2 Score: 0  Alcohol Screen: Low Risk (06/19/2024)   Alcohol Screen    Last Alcohol Screening Score (AUDIT): 0  Housing: Low Risk (06/19/2024)   Epic    Unable to Pay for Housing in the Last Year:  No    Number of Times Moved in the Last Year: 0    Homeless in the Last Year: No  Utilities: Not At Risk (06/19/2024)   Epic    Threatened with loss of utilities: No  Health Literacy: Not on file   Past Medical History:  Past Medical History:  Diagnosis Date   Chronic lower extremity pain (2ry area of Pain) (Right) 12/21/2022   Chronic pain syndrome 12/20/2022   DDD (degenerative disc disease), 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 Tobie: Pancolonic diverticulosis, tortuous colon, next colonoscopy 2024   TONSILLECTOMY      Current Medications: Current Facility-Administered Medications  Medication Dose Route Frequency Provider Last Rate Last Admin   acetaminophen  (TYLENOL ) tablet 650 mg  650 mg Oral Q6H PRN Onuoha, Chinwendu V, NP   650 mg at 07/02/24 0559   alum & mag hydroxide-simeth (MAALOX/MYLANTA) 200-200-20 MG/5ML suspension 30 mL  30 mL Oral Q4H PRN Bobbitt, Shalon E, NP   30 mL at 07/03/24 0212   aspirin  EC tablet 81 mg  81 mg Oral Daily Bobbitt, Shalon E, NP   81 mg at 07/03/24 9066   bisacodyl  (DULCOLAX) EC tablet 10 mg  10 mg Oral Daily PRN Maddux First, MD       cholecalciferol  (VITAMIN D3) 25 MCG (1000 UNIT) tablet 1,000 Units  1,000 Units Oral Weekly Alcide Memoli, MD   1,000 Units at 06/27/24 1251   cloZAPine  (CLOZARIL ) tablet 75 mg  75 mg Oral BID Tyannah Sane, MD   75 mg at 07/03/24 9060   Or   OLANZapine  (ZYPREXA ) injection 10 mg  10 mg Intramuscular BID Tyreesha Maharaj, MD       diclofenac  Sodium (VOLTAREN ) 1 % topical gel 4 g  4 g Topical QID Donnelly Mellow, MD   4 g at 07/03/24 0935   divalproex  (DEPAKOTE  ER) 24 hr tablet 500 mg  500 mg Oral Daily Rylen Swindler, MD       DULoxetine  (CYMBALTA ) DR capsule 40 mg  40 mg Oral Daily Erika Slaby, MD   40 mg at 07/03/24 1148   famotidine  (PEPCID ) tablet 20  mg  20 mg Oral Daily Maximilien Hayashi, MD   20 mg at 07/03/24 9060   fluticasone  (FLONASE ) 50 MCG/ACT nasal spray 1 spray  1 spray Each Nare Daily Bobbitt, Shalon E, NP       gabapentin  (NEURONTIN ) capsule 300 mg  300 mg Oral TID Bobbitt, Shalon E, NP   300 mg at 07/03/24 0934   hydrochlorothiazide  (HYDRODIURIL ) tablet 25 mg  25 mg Oral Daily Vallarie Fei, MD   25 mg at 07/03/24 0934   hydrOXYzine  (ATARAX ) tablet 25 mg  25 mg Oral TID PRN Bobbitt, Shalon E, NP   25 mg at 06/26/24 0731   linagliptin  (TRADJENTA )  tablet 5 mg  5 mg Oral Daily Bobbitt, Shalon E, NP   5 mg at 07/03/24 9066   lisinopril  (ZESTRIL ) tablet 30 mg  30 mg Oral Daily Bobbitt, Shalon E, NP   30 mg at 07/03/24 9066   magnesium  hydroxide (MILK OF MAGNESIA) suspension 30 mL  30 mL Oral Daily PRN Bobbitt, Shalon E, NP       metFORMIN  (GLUCOPHAGE ) tablet 1,000 mg  1,000 mg Oral Q supper Bobbitt, Shalon E, NP   1,000 mg at 07/02/24 1708   multivitamin with minerals tablet 1 tablet  1 tablet Oral Daily Bobbitt, Shalon E, NP   1 tablet at 07/03/24 0933   naproxen  (NAPROSYN ) tablet 375 mg  375 mg Oral TID WC Shilo Pauwels, MD   375 mg at 07/03/24 0934   OLANZapine  (ZYPREXA ) injection 5 mg  5 mg Intramuscular TID PRN Bobbitt, Shalon E, NP   5 mg at 06/22/24 2259   OLANZapine  zydis (ZYPREXA ) disintegrating tablet 5 mg  5 mg Oral TID PRN Bobbitt, Shalon E, NP   5 mg at 07/02/24 2123   ondansetron  (ZOFRAN ) tablet 4 mg  4 mg Oral Q8H PRN Bobbitt, Shalon E, NP       potassium chloride  SA (KLOR-CON  M) CR tablet 20 mEq  20 mEq Oral BID Bobbitt, Shalon E, NP   20 mEq at 07/03/24 9066   propranolol  (INDERAL ) tablet 40 mg  40 mg Oral BID Bobbitt, Shalon E, NP   40 mg at 07/03/24 9065   rosuvastatin  (CRESTOR ) tablet 5 mg  5 mg Oral Daily Bobbitt, Shalon E, NP   5 mg at 07/03/24 9066   traZODone  (DESYREL ) tablet 50 mg  50 mg Oral QHS PRN Bobbitt, Shalon E, NP       zolpidem  (AMBIEN ) tablet 10 mg  10 mg Oral QHS Emie Sommerfeld, MD   10 mg at  06/30/24 2213    Lab Results:  No results found for this or any previous visit (from the past 48 hours).     Blood Alcohol level:  Lab Results  Component Value Date   [facility] <15 06/18/2024    Metabolic Disorder Labs: Lab Results  Component Value Date   HGBA1C 5.9 (H) 06/28/2024   MPG 122.63 06/28/2024   No results found for: PROLACTIN Lab Results  Component Value Date   CHOL 156 06/28/2024   TRIG 89 06/28/2024   HDL 70 06/28/2024   CHOLHDL 2.2 06/28/2024   VLDL 18 06/28/2024   LDLCALC 69 06/28/2024    Physical Findings: AIMS:  , ,  ,  ,    CIWA:    COWS:      Psychiatric Specialty Exam:  Presentation  General Appearance:  Casual  Eye Contact: Fleeting  Speech: Normal Rate  Speech Volume: Increased    Mood and Affect  Mood: Euphoric  Affect: Labile   Thought Process  Thought Processes: Disorganized  Orientation:Full (Time, Place and Person)  Thought Content:Illogical; Delusions; Tangential  Hallucinations: Denies  Ideas of Reference:Delusions  Suicidal Thoughts: Denies  Homicidal Thoughts: Denies   Sensorium  Memory: Immediate Fair; Remote Fair  Judgment: Impaired  Insight: Shallow   Executive Functions  Concentration: Poor  Attention Span: Poor  Recall: [employer] of Knowledge: Fair  Language: Fair   Psychomotor Activity  Psychomotor Activity: No data recorded  Musculoskeletal: Strength & Muscle Tone: within normal limits Gait & Station: normal Assets  Assets: Manufacturing Systems Engineer; Desire for Improvement    Physical Exam: Physical Exam Vitals  and nursing note reviewed.    ROS Blood pressure 119/73, pulse (!) 115, temperature 97.6 F (36.4 C), resp. rate 18, height 5' 5 (1.651 m), weight 81 kg, SpO2 100%. Body mass index is 29.7 kg/m.  Diagnosis: Active Problems:   Schizoaffective disorder, bipolar type [facility])  Clinical Decision Making: Patient is currently admitted for worsening  psychosis, manic symptoms, tangential grandiosity.  She needs inpatient hospitalization for medication management and close monitoring.  Patient is unable to make decisions on her own as she is unable to appreciate and understand her mental health history, the current need for treatment.  [facility] has screened her case and for further evaluation for legal guardianship. [facility] screened in her case. Sister and mom are involved in her care.  Treatment Plan Summary:  Safety and Monitoring: If patient meets criteria for nonemergent forced medication we will need to change her status to involuntary commitment             -- Voluntary admission to inpatient psychiatric unit for safety, stabilization and treatment             -- Daily contact with patient to assess and evaluate symptoms and progress in treatment             -- Patient's case to be discussed in multi-disciplinary team meeting             -- Observation Level: q15 minute checks             -- Vital signs:  q12 hours             -- Precautions: suicide, elopement, and assault   2. Psychiatric Diagnoses and Treatment:   Increase Clozaril  100 mg twice daily enforced with zyprexa  10mg  BID-plan to increase Clozaril  dose to 100mg  BID Cymbalta  40 mg daily   -- The risks/benefits/side-effects/alternatives to this medication were discussed in detail with the patient and time was given for questions. The patient consents to medication trial.                -- Metabolic profile and EKG monitoring obtained while on an atypical antipsychotic (BMI: Lipid Panel: HbgA1c: QTc:)              -- Encouraged patient to participate in unit milieu and in scheduled group therapies                            3. Medical Issues Being Addressed:   4. Discharge Planning:   -- Social work and case management to assist with discharge planning and identification of hospital follow-up needs prior to discharge  -- Estimated LOS: 3-4 days  Allyn Foil, MD 07/03/2024,  12:43 PM

## 2024-07-03 NOTE — Group Note (Signed)
 Recreation Therapy Group Note   Group Topic:Healthy Support Systems  Group Date: 07/03/2024 Start Time: 1400 End Time: 1435 Facilitators: Celestia Jeoffrey BRAVO, LRT, CTRS Location: Dayroom  Group Description: Straw Bridge. In groups or individually, patients were given 10 plastic drinking straws and an equal length of masking tape. Using the materials provided, patients were instructed to build a free-standing bridge-like structure to suspend an everyday item (ex: deck of cards) off the floor or table surface. All materials were required to be used in secondary school teacher. LRT facilitated post-activity discussion reviewing the importance of having strong and healthy support systems in our lives. LRT discussed how the people in our lives serve as the tape and the deck of cards we placed on top of our straw structure are the stressors we face in daily life. LRT and pts discussed what happens in our life when things get too heavy for us , and we don't have strong supports outside of the hospital. Pt shared 2 of their healthy supports in their life aloud in the group.   Goal Area(s) Addressed:  Patient will identify 2 healthy supports in their life. Patient will identify skills to successfully complete activity. Patient will identify correlation of this activity to life post-discharge.  Patient will build on frustration tolerance skills. Patient will increase team building and communication skills.    Affect/Mood: N/A   Participation Level: Did not attend    Clinical Observations/Individualized Feedback: Patient did not attend.  Plan: Continue to engage patient in RT group sessions 2-3x/week.   Jeoffrey BRAVO Celestia, LRT, CTRS 07/03/2024 4:46 PM

## 2024-07-03 NOTE — Plan of Care (Signed)
" °  Problem: Education: Goal: Mental status will improve Outcome: Not Progressing   Problem: Activity: Goal: Sleeping patterns will improve Outcome: Not Progressing   Problem: Health Behavior/Discharge Planning: Goal: Compliance with treatment plan for underlying cause of condition will improve Outcome: Not Progressing   Problem: Safety: Goal: Periods of time without injury will increase Outcome: Progressing   "

## 2024-07-03 NOTE — Group Note (Signed)
 Date:  07/03/2024 Time:  4:55 PM  Group Topic/Focus:  Building Self Esteem:   The Focus of this group is helping patients become aware of the effects of self-esteem on their lives, the things they and others do that enhance or undermine their self-esteem, seeing the relationship between their level of self-esteem and the choices they make and learning ways to enhance self-esteem.    Participation Level:  Did Not Attend   Valerie [name] 07/03/2024, 4:55 PM

## 2024-07-03 NOTE — Plan of Care (Signed)
" °  Problem: Self-Concept: Goal: Ability to modify response to factors that promote anxiety will improve Outcome: Not Progressing   Problem: Activity: Goal: Will verbalize the importance of balancing activity with adequate rest periods Outcome: Not Progressing   "

## 2024-07-03 NOTE — Group Note (Signed)
 Date:  07/03/2024 Time:  2:14 PM  Group Topic/Focus:  Healthy Communication:   The focus of this group is to discuss communication, barriers to communication, as well as healthy ways to communicate with others. Making Healthy Choices:   The focus of this group is to help patients identify negative/unhealthy choices they were using prior to admission and identify positive/healthier coping strategies to replace them upon discharge. Managing Feelings:   The focus of this group is to identify what feelings patients have difficulty handling and develop a plan to handle them in a healthier way upon discharge. Wellness Toolbox:   The focus of this group is to discuss various aspects of wellness, balancing those aspects and exploring ways to increase the ability to experience wellness.  Patients will create a wellness toolbox for use upon discharge.  The group completed worksheets focused on expressing how each patient was feeling, followed by a positive Would You Rather activity. Patients also participated in a Complete the Sentence exercise, a simple crossword, and brain teasers to encourage interaction and engagement. The group discussed healthy ways to cope with daily stressors and practiced sharing coping strategies.  Participation Level:  Minimal  Participation Quality:  Inattentive  Affect:    Cognitive:  Lacking  Insight: Lacking and Limited  Engagement in Group:  Limited  Modes of Intervention:  Activity and Discussion  Additional Comments:    Valerie [name] L Radie Berges 07/03/2024, 2:14 PM

## 2024-07-04 DIAGNOSIS — F250 Schizoaffective disorder, bipolar type: Secondary | ICD-10-CM | POA: Diagnosis not present

## 2024-07-04 MED ORDER — NAPROXEN 500 MG PO TABS
500.0000 mg | ORAL_TABLET | Freq: Once | ORAL | Status: AC
  Administered 2024-07-04: 500 mg via ORAL
  Filled 2024-07-04: qty 1

## 2024-07-04 MED ORDER — NAPROXEN 250 MG PO TABS
375.0000 mg | ORAL_TABLET | Freq: Three times a day (TID) | ORAL | Status: AC
  Administered 2024-07-04 – 2024-08-18 (×113): 375 mg via ORAL
  Filled 2024-07-04 (×120): qty 2

## 2024-07-04 MED ORDER — ZIPRASIDONE HCL 20 MG PO CAPS
20.0000 mg | ORAL_CAPSULE | Freq: Every day | ORAL | Status: DC
  Administered 2024-07-04 – 2024-07-25 (×21): 20 mg via ORAL
  Filled 2024-07-04 (×18): qty 1

## 2024-07-04 NOTE — Progress Notes (Signed)
" °   07/03/24 2318  Psych Admission Type (Psych Patients Only)  Admission Status Voluntary  Psychosocial Assessment  Patient Complaints Restlessness;Agitation;Suspiciousness  Eye Contact Suspiciousness  Facial Expression Animated  Affect Irritable;Labile  Speech Argumentative  Interaction Defensive;Guarded;Manipulative  Motor Activity Restless  Appearance/Hygiene Unremarkable  Behavior Characteristics Anxious;Guarded;Impulsive  Mood Labile  Thought Process  Coherency Tangential  Content Blaming others;Delusions;Preoccupation  Delusions Grandeur;Controlling;Paranoid  Perception Hallucinations;Depersonalization  Hallucination Auditory  Judgment Impaired  Confusion Mild  Danger to Self  Current suicidal ideation? Denies  Agreement Not to Harm Self Yes  Description of Agreement verbal  Danger to Others  Danger to Others None reported or observed     Estimated Sleeping Duration (Last 24 Hours): 4.00-5.50 hours  "

## 2024-07-04 NOTE — Progress Notes (Signed)
 [facility] MD Progress Note  07/04/2024 4:55 PM Valerie [name]  MRN:  [id number]   Valerie [name] is a 63 year old female presenting voluntary to [facility] requesting a psychiatric evaluation for worsening psychosis. Patient has history of schizophrenia. Patient denied SI, HI, psychosis and alcohol/drug usage. Patient resides at [facility]. Patient reports people are coming by my door talking loud, yelling and screaming and coming in my room with keys. Patient reports having multiple medical Afibs and blood clots due to this. Patient reports people are doing things in the dining room. When asked if she called EMS, patient stated I called EMS, I stripped searched over the phone and called the authorities. Patient is admitted to [facility] unit with Q15 min safety monitoring. Multidisciplinary team approach is offered. Medication management; group/milieu therapy is offered.   Subjective:  Chart reviewed, case discussed in multidisciplinary meeting, patient seen during rounds.   Patient is noted to be resting in bed.  She continues to complain about giving her toxic doses of Clozaril  and Depakote .  Patient reports that Depakote  is too strong for her and she will not be taking it.  She then told the provider that Clozaril  should be reduced to 25 mg as she reportedly was taking 25 mg only in the past.  Provider educated her about her mood instability and needing a mood stabilizer.  Patient then requested to add Geodon  instead of Depakote  but then remains persistent about being just on 20 mg.  Provider educated patient about optimal dosing to reach therapeutic affect and patient agreed to titrate Geodon  to up to 40 mg in the next few days.  She denies SI/HI/plan and denies hallucinations but remains grandiose, manic with poor personal boundaries and tangential/intrusive with patients and staff on the unit.    Able to understand medical problem-NO  Able to understand proposed  treatment -NO   Able to understand alternative to proposed treatment (if any) -NO   Able to understand option of refusing proposed treatment (including withholding or withdrawing proposed treatment)-NO   Able to appreciate reasonably foreseeable consequences of accepting proposed treatment -NO  After thorough assessment, it is our clinical opinion-  Capacity is not competency. Competency is determined by legal system and judge. Capacity can vary from time to time depending on the mental status of the patient.  Past Psychiatric History: see h&P Family History:  Family History  Problem Relation Age of Onset   Colon cancer Neg Hx    Celiac disease Neg Hx    Inflammatory bowel disease Neg Hx    Social History:  Social History   Substance and Sexual Activity  Alcohol Use Never     Social History   Substance and Sexual Activity  Drug Use Never    Social History   Socioeconomic History   Marital status: Single    Spouse name: Not on file   Number of children: Not on file   Years of education: Not on file   Highest education level: Not on file  Occupational History   Not on file  Tobacco Use   Smoking status: Never   Smokeless tobacco: Never  Substance and Sexual Activity   Alcohol use: Never   Drug use: Never   Sexual activity: Not Currently    Birth control/protection: Pill  Other Topics Concern   Not on file  Social History Narrative   Not on file   Social Drivers of Health   Tobacco Use: Low Risk (06/19/2024)  Patient History    Smoking Tobacco Use: Never    Smokeless Tobacco Use: Never    Passive Exposure: Not on file  Financial Resource Strain: Not on file  Food Insecurity: Unknown (06/19/2024)   Epic    Worried About Programme Researcher, Broadcasting/film/video in the Last Year: Never true    [employer] of Food in the Last Year: Patient declined  Transportation Needs: No Transportation Needs (06/19/2024)   Epic    Lack of Transportation (Medical): No    Lack of Transportation  (Non-Medical): No  Physical Activity: Not on file  Stress: Not on file  Social Connections: Not on file  Depression (PHQ2-9): Low Risk (12/21/2022)   Depression (PHQ2-9)    PHQ-2 Score: 0  Alcohol Screen: Low Risk (06/19/2024)   Alcohol Screen    Last Alcohol Screening Score (AUDIT): 0  Housing: Low Risk (06/19/2024)   Epic    Unable to Pay for Housing in the Last Year: No    Number of Times Moved in the Last Year: 0    Homeless in the Last Year: No  Utilities: Not At Risk (06/19/2024)   Epic    Threatened with loss of utilities: No  Health Literacy: Not on file   Past Medical History:  Past Medical History:  Diagnosis Date   Chronic lower extremity pain (2ry area of Pain) (Right) 12/21/2022   Chronic pain syndrome 12/20/2022   DDD (degenerative disc disease), 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 Tobie: Pancolonic diverticulosis, tortuous colon, next colonoscopy 2024   TONSILLECTOMY      Current Medications: Current Facility-Administered Medications  Medication Dose Route Frequency Provider Last Rate Last Admin   acetaminophen  (TYLENOL ) tablet 650 mg  650 mg Oral Q6H PRN Onuoha, Chinwendu V, NP   650 mg at 07/02/24 0559   alum & mag hydroxide-simeth (MAALOX/MYLANTA) 200-200-20 MG/5ML suspension 30 mL  30 mL Oral Q4H PRN Bobbitt, Shalon E, NP   30 mL at 07/03/24 0212   aspirin  EC tablet 81 mg  81 mg Oral Daily Bobbitt, Shalon E, NP   81 mg at 07/04/24 0950   bisacodyl  (DULCOLAX) EC tablet 10 mg  10 mg Oral Daily PRN Jamirah Zelaya, MD       cholecalciferol  (VITAMIN D3) 25 MCG (1000 UNIT) tablet 1,000 Units  1,000 Units Oral Weekly Zaccai Chavarin, MD   1,000 Units at 07/04/24 1201   cloZAPine  (CLOZARIL ) tablet 100 mg  100 mg Oral BID Lakela Kuba, MD   100 mg at 07/04/24 9049   Or   OLANZapine  (ZYPREXA ) injection 10 mg  10 mg  Intramuscular BID Khyleigh Furney, MD       diclofenac  Sodium (VOLTAREN ) 1 % topical gel 4 g  4 g Topical QID Donnelly Mellow, MD   4 g at 07/04/24 1637   divalproex  (DEPAKOTE  ER) 24 hr tablet 500 mg  500 mg Oral Daily Amalee Olsen, MD       DULoxetine  (CYMBALTA ) DR capsule 40 mg  40 mg Oral Daily Jazmynn Pho, MD   40 mg at 07/04/24 9049   famotidine  (PEPCID ) tablet 20 mg  20 mg Oral Daily Stori Royse, MD   20 mg at 07/04/24 0950   fluticasone  (FLONASE ) 50 MCG/ACT nasal spray 1 spray  1 spray Each Nare Daily Bobbitt, Shalon E, NP  gabapentin  (NEURONTIN ) capsule 300 mg  300 mg Oral TID Bobbitt, Shalon E, NP   300 mg at 07/04/24 1638   hydrochlorothiazide  (HYDRODIURIL ) tablet 25 mg  25 mg Oral Daily Niralya Ohanian, MD   25 mg at 07/04/24 9049   hydrOXYzine  (ATARAX ) tablet 25 mg  25 mg Oral TID PRN Bobbitt, Shalon E, NP   25 mg at 06/26/24 0731   linagliptin  (TRADJENTA ) tablet 5 mg  5 mg Oral Daily Bobbitt, Shalon E, NP   5 mg at 07/04/24 9049   lisinopril  (ZESTRIL ) tablet 30 mg  30 mg Oral Daily Bobbitt, Shalon E, NP   30 mg at 07/04/24 9049   magnesium  hydroxide (MILK OF MAGNESIA) suspension 30 mL  30 mL Oral Daily PRN Bobbitt, Shalon E, NP       metFORMIN  (GLUCOPHAGE ) tablet 1,000 mg  1,000 mg Oral Q supper Bobbitt, Shalon E, NP   1,000 mg at 07/04/24 1626   multivitamin with minerals tablet 1 tablet  1 tablet Oral Daily Bobbitt, Shalon E, NP   1 tablet at 07/04/24 0950   naproxen  (NAPROSYN ) tablet 375 mg  375 mg Oral TID WC Roemello Speyer, MD   375 mg at 07/04/24 1626   OLANZapine  (ZYPREXA ) injection 5 mg  5 mg Intramuscular TID PRN Bobbitt, Shalon E, NP   5 mg at 06/22/24 2259   OLANZapine  zydis (ZYPREXA ) disintegrating tablet 5 mg  5 mg Oral TID PRN Bobbitt, Shalon E, NP   5 mg at 07/02/24 2123   ondansetron  (ZOFRAN ) tablet 4 mg  4 mg Oral Q8H PRN Bobbitt, Shalon E, NP       potassium chloride  SA (KLOR-CON  M) CR tablet 20 mEq  20 mEq Oral BID Bobbitt, Shalon E, NP   20 mEq at  07/04/24 0950   propranolol  (INDERAL ) tablet 40 mg  40 mg Oral BID Bobbitt, Shalon E, NP   40 mg at 07/04/24 0950   rosuvastatin  (CRESTOR ) tablet 5 mg  5 mg Oral Daily Bobbitt, Shalon E, NP   5 mg at 07/04/24 1201   traZODone  (DESYREL ) tablet 50 mg  50 mg Oral QHS PRN Bobbitt, Shalon E, NP       zolpidem  (AMBIEN ) tablet 10 mg  10 mg Oral QHS Gary Bultman, MD   10 mg at 06/30/24 2213    Lab Results:  No results found for this or any previous visit (from the past 48 hours).     Blood Alcohol level:  Lab Results  Component Value Date   [facility] <15 06/18/2024    Metabolic Disorder Labs: Lab Results  Component Value Date   HGBA1C 5.9 (H) 06/28/2024   MPG 122.63 06/28/2024   No results found for: PROLACTIN Lab Results  Component Value Date   CHOL 156 06/28/2024   TRIG 89 06/28/2024   HDL 70 06/28/2024   CHOLHDL 2.2 06/28/2024   VLDL 18 06/28/2024   LDLCALC 69 06/28/2024    Physical Findings: AIMS:  , ,  ,  ,    CIWA:    COWS:      Psychiatric Specialty Exam:  Presentation  General Appearance:  Casual  Eye Contact: Fleeting  Speech: Normal Rate  Speech Volume: Increased    Mood and Affect  Mood: Euphoric  Affect: Labile   Thought Process  Thought Processes: Disorganized  Orientation:Full (Time, Place and Person)  Thought Content:Illogical; Delusions; Tangential  Hallucinations: Denies  Ideas of Reference:Delusions  Suicidal Thoughts: Denies  Homicidal Thoughts: Denies   Sensorium  Memory:  Immediate Fair; Remote Fair  Judgment: Impaired  Insight: Shallow   Executive Functions  Concentration: Poor  Attention Span: Poor  Recall: [employer] of Knowledge: Fair  Language: Fair   Psychomotor Activity  Psychomotor Activity: No data recorded  Musculoskeletal: Strength & Muscle Tone: within normal limits Gait & Station: normal Assets  Assets: Manufacturing Systems Engineer; Desire for Improvement    Physical  Exam: Physical Exam Vitals and nursing note reviewed.    ROS Blood pressure (!) 117/53, pulse (!) 103, temperature 97.8 F (36.6 C), resp. rate 16, height 5' 5 (1.651 m), weight 81 kg, SpO2 100%. Body mass index is 29.7 kg/m.  Diagnosis: Active Problems:   Schizoaffective disorder, bipolar type [facility])  Clinical Decision Making: Patient is currently admitted for worsening psychosis, manic symptoms, tangential grandiosity.  She needs inpatient hospitalization for medication management and close monitoring.  Patient is unable to make decisions on her own as she is unable to appreciate and understand her mental health history, the current need for treatment.  [facility] has screened her case and for further evaluation for legal guardianship. [facility] screened in her case. Sister and mom are involved in her care.  Treatment Plan Summary:  Safety and Monitoring: If patient meets criteria for nonemergent forced medication we will need to change her status to involuntary commitment             -- Voluntary admission to inpatient psychiatric unit for safety, stabilization and treatment             -- Daily contact with patient to assess and evaluate symptoms and progress in treatment             -- Patient's case to be discussed in multi-disciplinary team meeting             -- Observation Level: q15 minute checks             -- Vital signs:  q12 hours             -- Precautions: suicide, elopement, and assault   2. Psychiatric Diagnoses and Treatment:  Discontinue Depakote  as patient is noncompliant Added Geodon  20 mg nightly as patient is agreeable to take Geodon  Clozaril  100 mg twice daily enforced with zyprexa  10mg  BID- Cymbalta  40 mg daily   -- The risks/benefits/side-effects/alternatives to this medication were discussed in detail with the patient and time was given for questions. The patient consents to medication trial.                -- Metabolic profile and EKG monitoring obtained while on an  atypical antipsychotic (BMI: Lipid Panel: HbgA1c: QTc:)              -- Encouraged patient to participate in unit milieu and in scheduled group therapies                            3. Medical Issues Being Addressed:   4. Discharge Planning:   -- Social work and case management to assist with discharge planning and identification of hospital follow-up needs prior to discharge  -- Estimated LOS: 3-4 days  Allyn Foil, MD 07/04/2024, 4:55 PM

## 2024-07-04 NOTE — Group Note (Signed)
 Date:  07/04/2024 Time:  2:51 PM  Group Topic/Focus:  Early Warning Signs:   The focus of this group is to help patients identify signs or symptoms they exhibit before slipping into an unhealthy state or crisis.    Participation Level:  Active  Participation Quality:  Appropriate  Affect:  Appropriate  Cognitive:  Appropriate  Insight: Appropriate  Engagement in Group:  Engaged  Modes of Intervention:  Discussion   Arland Nutting 07/04/2024, 2:51 PM

## 2024-07-04 NOTE — BH IP Treatment Plan (Signed)
 Interdisciplinary Treatment and Diagnostic Plan Update  07/04/2024 Time of Session: 2:00 PM  Valerie [name] MRN: [id number]  Principal Diagnosis: Schizophrenia [facility])  Secondary Diagnoses: Active Problems:   Schizoaffective disorder, bipolar type (HCC)   Current Medications:  Current Facility-Administered Medications  Medication Dose Route Frequency Provider Last Rate Last Admin   acetaminophen  (TYLENOL ) tablet 650 mg  650 mg Oral Q6H PRN Onuoha, Chinwendu V, NP   650 mg at 07/02/24 0559   alum & mag hydroxide-simeth (MAALOX/MYLANTA) 200-200-20 MG/5ML suspension 30 mL  30 mL Oral Q4H PRN Bobbitt, Shalon E, NP   30 mL at 07/03/24 0212   aspirin  EC tablet 81 mg  81 mg Oral Daily Bobbitt, Shalon E, NP   81 mg at 07/04/24 0950   bisacodyl  (DULCOLAX) EC tablet 10 mg  10 mg Oral Daily PRN Jadapalle, Sree, MD       cholecalciferol  (VITAMIN D3) 25 MCG (1000 UNIT) tablet 1,000 Units  1,000 Units Oral Weekly Jadapalle, Sree, MD   1,000 Units at 07/04/24 1201   cloZAPine  (CLOZARIL ) tablet 100 mg  100 mg Oral BID Jadapalle, Sree, MD   100 mg at 07/04/24 9049   Or   OLANZapine  (ZYPREXA ) injection 10 mg  10 mg Intramuscular BID Jadapalle, Sree, MD       diclofenac  Sodium (VOLTAREN ) 1 % topical gel 4 g  4 g Topical QID Jadapalle, Sree, MD   4 g at 07/04/24 9050   divalproex  (DEPAKOTE  ER) 24 hr tablet 500 mg  500 mg Oral Daily Jadapalle, Sree, MD       DULoxetine  (CYMBALTA ) DR capsule 40 mg  40 mg Oral Daily Jadapalle, Sree, MD   40 mg at 07/04/24 9049   famotidine  (PEPCID ) tablet 20 mg  20 mg Oral Daily Jadapalle, Sree, MD   20 mg at 07/04/24 9049   fluticasone  (FLONASE ) 50 MCG/ACT nasal spray 1 spray  1 spray Each Nare Daily Bobbitt, Shalon E, NP       gabapentin  (NEURONTIN ) capsule 300 mg  300 mg Oral TID Bobbitt, Shalon E, NP   300 mg at 07/04/24 0950   hydrochlorothiazide  (HYDRODIURIL ) tablet 25 mg  25 mg Oral Daily Jadapalle, Sree, MD   25 mg at 07/04/24 0950   hydrOXYzine  (ATARAX ) tablet 25 mg   25 mg Oral TID PRN Bobbitt, Shalon E, NP   25 mg at 06/26/24 0731   linagliptin  (TRADJENTA ) tablet 5 mg  5 mg Oral Daily Bobbitt, Shalon E, NP   5 mg at 07/04/24 0950   lisinopril  (ZESTRIL ) tablet 30 mg  30 mg Oral Daily Bobbitt, Shalon E, NP   30 mg at 07/04/24 9049   magnesium  hydroxide (MILK OF MAGNESIA) suspension 30 mL  30 mL Oral Daily PRN Bobbitt, Shalon E, NP       metFORMIN  (GLUCOPHAGE ) tablet 1,000 mg  1,000 mg Oral Q supper Bobbitt, Shalon E, NP   1,000 mg at 07/03/24 1651   multivitamin with minerals tablet 1 tablet  1 tablet Oral Daily Bobbitt, Shalon E, NP   1 tablet at 07/04/24 0950   naproxen  (NAPROSYN ) tablet 375 mg  375 mg Oral TID WC Jadapalle, Sree, MD   375 mg at 07/04/24 1209   OLANZapine  (ZYPREXA ) injection 5 mg  5 mg Intramuscular TID PRN Bobbitt, Shalon E, NP   5 mg at 06/22/24 2259   OLANZapine  zydis (ZYPREXA ) disintegrating tablet 5 mg  5 mg Oral TID PRN Bobbitt, Shalon E, NP   5 mg at 07/02/24 2123  ondansetron  (ZOFRAN ) tablet 4 mg  4 mg Oral Q8H PRN Bobbitt, Shalon E, NP       potassium chloride  SA (KLOR-CON  M) CR tablet 20 mEq  20 mEq Oral BID Bobbitt, Shalon E, NP   20 mEq at 07/04/24 0950   propranolol  (INDERAL ) tablet 40 mg  40 mg Oral BID Bobbitt, Shalon E, NP   40 mg at 07/04/24 9049   rosuvastatin  (CRESTOR ) tablet 5 mg  5 mg Oral Daily Bobbitt, Shalon E, NP   5 mg at 07/04/24 1201   traZODone  (DESYREL ) tablet 50 mg  50 mg Oral QHS PRN Bobbitt, Shalon E, NP       zolpidem  (AMBIEN ) tablet 10 mg  10 mg Oral QHS Jadapalle, Sree, MD   10 mg at 06/30/24 2213   PTA Medications: Medications Prior to Admission  Medication Sig Dispense Refill Last Dose/Taking   acetaminophen  (TYLENOL ) 650 MG CR tablet Take 650 mg by mouth every 8 (eight) hours as needed for pain.      benztropine (COGENTIN) 0.5 MG tablet Take by mouth.      bisacodyl  5 MG EC tablet As directed for procedure 8 tablet 0    Cholecalciferol  (VITAMIN D ) 125 MCG (5000 UT) CAPS Take 1,000 Units by mouth once  a week.      cloZAPine  (CLOZARIL ) 100 MG tablet Take 200 mg by mouth 2 (two) times daily.      DULoxetine  (CYMBALTA ) 20 MG capsule Take 20 mg by mouth daily.      fluticasone  (FLONASE ) 50 MCG/ACT nasal spray Place 1 spray into both nostrils daily.      gabapentin  (NEURONTIN ) 300 MG capsule Take 300 mg by mouth 3 (three) times daily.      haloperidol  (HALDOL ) 0.5 MG tablet Take 0.5 mg by mouth 2 (two) times daily.      hydrochlorothiazide  (HYDRODIURIL ) 25 MG tablet Take 25 mg by mouth daily.      lisinopril  (ZESTRIL ) 30 MG tablet Take 30 mg by mouth daily.      loperamide (IMODIUM) 2 MG capsule Take 2 mg by mouth as needed for diarrhea or loose stools. Take 1 capsule by mouth as needed with each loose stool/diarrhea nte 8 doses in 24 hr      Multiple Vitamin (DAILY VITE PO) Take 1 tablet by mouth daily.      naproxen  (NAPROSYN ) 500 MG tablet Take 500 mg by mouth every 12 (twelve) hours as needed.      omeprazole (PRILOSEC) 20 MG capsule Take 20 mg by mouth daily.      ondansetron  (ZOFRAN ) 4 MG tablet Take 4 mg by mouth every 8 (eight) hours as needed for nausea or vomiting.      potassium chloride  SA (KLOR-CON  M) 20 MEQ tablet Take 20 mEq by mouth 2 (two) times daily.      propranolol  (INDERAL ) 40 MG tablet Take 40 mg by mouth 2 (two) times daily.      rosuvastatin  (CRESTOR ) 5 MG tablet Take 5 mg by mouth daily.      sodium phosphate  (FLEET) ENEM As directed for procedure 133 mL 0    triamcinolone ointment (KENALOG) 0.1 % Apply topically.       Patient Stressors: Medication change or noncompliance    Patient Strengths: Ability for insight   Treatment Modalities: Medication Management, Group therapy, Case management,  1 to 1 session with clinician, Psychoeducation, Recreational therapy.   Physician Treatment Plan for Primary Diagnosis: Schizophrenia [facility]) Long Term Goal(s): Improvement in symptoms  so as ready for discharge   Short Term Goals: Ability to identify changes in lifestyle to  reduce recurrence of condition will improve Ability to verbalize feelings will improve Ability to disclose and discuss suicidal ideas Ability to demonstrate self-control will improve Ability to identify and develop effective coping behaviors will improve  Medication Management: Evaluate patient's response, side effects, and tolerance of medication regimen.  Therapeutic Interventions: 1 to 1 sessions, Unit Group sessions and Medication administration.  Evaluation of Outcomes: Progressing  Physician Treatment Plan for Secondary Diagnosis: Active Problems:   Schizoaffective disorder, bipolar type (HCC)  Long Term Goal(s): Improvement in symptoms so as ready for discharge   Short Term Goals: Ability to identify changes in lifestyle to reduce recurrence of condition will improve Ability to verbalize feelings will improve Ability to disclose and discuss suicidal ideas Ability to demonstrate self-control will improve Ability to identify and develop effective coping behaviors will improve     Medication Management: Evaluate patient's response, side effects, and tolerance of medication regimen.  Therapeutic Interventions: 1 to 1 sessions, Unit Group sessions and Medication administration.  Evaluation of Outcomes: Progressing   RN Treatment Plan for Primary Diagnosis: Schizophrenia (HCC) Long Term Goal(s): Knowledge of disease and therapeutic regimen to maintain health will improve  Short Term Goals: Ability to remain free from injury will improve, Ability to verbalize frustration and anger appropriately will improve, Ability to demonstrate self-control, Ability to participate in decision making will improve, Ability to verbalize feelings will improve, Ability to disclose and discuss suicidal ideas, Ability to identify and develop effective coping behaviors will improve, and Compliance with prescribed medications will improve  Medication Management: RN will administer medications as ordered  by provider, will assess and evaluate patient's response and provide education to patient for prescribed medication. RN will report any adverse and/or side effects to prescribing provider.  Therapeutic Interventions: 1 on 1 counseling sessions, Psychoeducation, Medication administration, Evaluate responses to treatment, Monitor vital signs and CBGs as ordered, Perform/monitor CIWA, COWS, AIMS and Fall Risk screenings as ordered, Perform wound care treatments as ordered.  Evaluation of Outcomes: Progressing   LCSW Treatment Plan for Primary Diagnosis: Schizophrenia (HCC) Long Term Goal(s): Safe transition to appropriate next level of care at discharge, Engage patient in therapeutic group addressing interpersonal concerns.  Short Term Goals: Engage patient in aftercare planning with referrals and resources, Increase social support, Increase ability to appropriately verbalize feelings, Increase emotional regulation, Facilitate acceptance of mental health diagnosis and concerns, Facilitate patient progression through stages of change regarding substance use diagnoses and concerns, Identify triggers associated with mental health/substance abuse issues, and Increase skills for wellness and recovery  Therapeutic Interventions: Assess for all discharge needs, 1 to 1 time with Social worker, Explore available resources and support systems, Assess for adequacy in community support network, Educate family and significant other(s) on suicide prevention, Complete Psychosocial Assessment, Interpersonal group therapy.  Evaluation of Outcomes: Progressing   Progress in Treatment: Attending groups: Yes. And No.  Participating in groups: Yes. And No.  Taking medication as prescribed: Yes. Toleration medication: Yes. Family/Significant other contact made: No, will contact:  CSW will contact if given permission.  Patient understands diagnosis: No. Discussing patient identified problems/goals with staff: Yes.   Medical problems stabilized or resolved: Yes.  Denies suicidal/homicidal ideation: Yes.  Issues/concerns per patient self-inventory: No.  Other: None    New problem(s) identified: No, Describe:  None  identified 06/29/24 Update: No, Describe:  None  Update 07/04/24: No changes at this time  New Short Term/Long Term Goal(s): elimination of symptoms of psychosis, medication management for mood stabilization; elimination of SI thoughts; development of comprehensive mental wellness plan. 06/29/24 Update: No changes at this time. Update 07/04/24: No changes at this time   Patient Goals:   To continue to get well 06/29/24 Update: No changes at this time. Update 07/04/24: No changes at this time   Discharge Plan or Barriers: CSW will assist with appropriate discharge planning 06/29/24 Update:  CSW has made APS report to [facility] APs following safety concerns. CSW to continue to coordinate with DSS worker to continue to assess. Update 07/04/24: No changes at this time   Reason for Continuation of Hospitalization: Delusions  Mania Medication stabilization    Estimated Length of Stay:1 to 7 days 06/29/24 Update: TBD. Update 07/04/24: TBD  Last 3 [place] Suicide Severity Risk Score: Flowsheet Row Admission (Current) from 06/19/2024 in [facility] ED from 06/18/2024 in [facility] Department at [facility] ED from 05/24/2022 in [facility] Emergency Department at [facility]  C-SSRS RISK CATEGORY No Risk No Risk No Risk    Last PHQ 2/9 Scores:    12/21/2022    9:00 AM  Depression screen PHQ 2/9  Decreased Interest 0  Down, Depressed, Hopeless 0  PHQ - 2 Score 0    Scribe for Treatment Team: Lum JONETTA Croft, [place] 07/04/2024 3:20 PM

## 2024-07-04 NOTE — Plan of Care (Signed)
   Problem: Coping: Goal: Ability to verbalize frustrations and anger appropriately will improve Outcome: Progressing Goal: Ability to demonstrate self-control will improve Outcome: Progressing

## 2024-07-04 NOTE — Group Note (Signed)
 Recreation Therapy Group Note   Group Topic:Leisure Education  Group Date: 07/04/2024 Start Time: 1400 End Time: 1450 Facilitators: Celestia Jeoffrey BRAVO, LRT, CTRS Location: Courtyard  Group Description: Music. Patients are encouraged to name their favorite song(s) for LRT to play song through speaker for group to hear, while in the courtyard getting fresh air and sunlight. Patients educated on the definition of leisure and the importance of having different leisure interests outside of the hospital. Group discussed how leisure activities can often be used as pharmacologist and that listening to music and being outside are examples.    Goal Area(s) Addressed:  Patient will identify a current leisure interest.  Patient will practice making a positive decision. Patient will have the opportunity to try a new leisure activity.   Affect/Mood: N/A   Participation Level: Did not attend    Clinical Observations/Individualized Feedback: Patient did not attend.  Plan: Continue to engage patient in RT group sessions 2-3x/week.   Jeoffrey BRAVO Celestia, LRT, CTRS 07/04/2024 4:32 PM

## 2024-07-04 NOTE — Group Note (Signed)
 Date:  07/04/2024 Time:  4:29 PM  Group Topic/Focus:    Coloring offers geriatric patients significant benefits, including reducing stress and anxiety, improving fine motor skills (dexterity, hand-eye coordination), boosting cognitive function (focus, memory), providing creative self-expression, fighting boredom, and encouraging social interaction, all while fostering a sense of accomplishment and combating loneliness, especially for those with dementia or limited mobility.  Coloring induces a meditative state, channeling focus and interrupting negative thought patterns, which lowers anxiety and calms the mind.It can boost serotonin levels, leading to better moods, and help seniors forget aches and pains temporarily.  Participation Level:  Did Not Attend    [name] 07/04/2024, 4:29 PM

## 2024-07-04 NOTE — Group Note (Signed)
 Date:  07/04/2024 Time:  8:38 PM  Group Topic/Focus:  Wrap-Up Group:   The focus of this group is to help patients review their daily goal of treatment and discuss progress on daily workbooks.    Participation Level:  Active  Participation Quality:  Appropriate  Affect:  Appropriate  Cognitive:  Appropriate  Insight: Appropriate  Engagement in Group:  Engaged  Modes of Intervention:  Discussion  Additional Comments:    Valerie [name] 07/04/2024, 8:38 PM

## 2024-07-04 NOTE — Plan of Care (Signed)
  Problem: Activity: Goal: Sleeping patterns will improve Outcome: Progressing   

## 2024-07-04 NOTE — Progress Notes (Signed)
" °   07/04/24 1300  Psych Admission Type (Psych Patients Only)  Admission Status Voluntary  Psychosocial Assessment  Patient Complaints Restlessness;Suspiciousness;Anxiety  Eye Contact Watchful  Facial Expression Animated  Affect Labile  Speech Argumentative  Interaction Manipulative;Sarcastic;Intrusive;Demanding  Motor Activity Restless  Appearance/Hygiene Unremarkable  Behavior Characteristics Intrusive;Anxious  Mood Labile  Thought Process  Coherency Tangential  Content Blaming others;Confabulation;Delusions;Preoccupation;Religiosity  Delusions Grandeur;Religious  Perception Hallucinations  Hallucination Auditory  Judgment Impaired  Confusion Mild  Danger to Self  Current suicidal ideation? Denies (DENIES)  Danger to Others  Danger to Others None reported or observed   Patient is unhappy that Clozaril  dosage has increased to 100 mg. Engaged in active listening. "

## 2024-07-04 NOTE — Group Note (Signed)
 LCSW Group Therapy Note   Group Date: 07/04/2024 Start Time: 1300 End Time: 1330   Type of Therapy and Topic:  Group Therapy: Challenging Core Beliefs  Participation Level:  Did Not Attend  Description of Group:  Patients were educated about core beliefs and asked to identify one harmful core belief that they have. Patients were asked to explore from where those beliefs originate. Patients were asked to discuss how those beliefs make them feel and the resulting behaviors of those beliefs. They were then be asked if those beliefs are true and, if so, what evidence they have to support them. Lastly, group members were challenged to replace those negative core beliefs with helpful beliefs.   Therapeutic Goals:   1. Patient will identify harmful core beliefs and explore the origins of such beliefs. 2. Patient will identify feelings and behaviors that result from those core beliefs. 3. Patient will discuss whether such beliefs are true. 4.  Patient will replace harmful core beliefs with helpful ones.  Summary of Patient Progress:  X  Therapeutic Modalities: Cognitive Behavioral Therapy; Solution-Focused Therapy   Valerie [name] [name], [place] 07/04/2024  2:04 PM

## 2024-07-05 DIAGNOSIS — F250 Schizoaffective disorder, bipolar type: Secondary | ICD-10-CM | POA: Diagnosis not present

## 2024-07-05 NOTE — Plan of Care (Signed)
" °  Problem: Health Behavior/Discharge Planning: Goal: Compliance with treatment plan for underlying cause of condition will improve Outcome: Progressing   Problem: Self-Concept: Goal: Level of anxiety will decrease Outcome: Progressing   "

## 2024-07-05 NOTE — Group Note (Signed)
 Date:  07/05/2024 Time:  3:27 PM  Group Topic/Focus:  Goals Group:   The focus of this group is to help patients establish daily goals to achieve during treatment and discuss how the patient can incorporate goal setting into their daily lives to aide in recovery. Managing Feelings:   The focus of this group is to identify what feelings patients have difficulty handling and develop a plan to handle them in a healthier way upon discharge.    Participation Level:  Did Not Attend   Valerie [name] BRAVO 07/05/2024, 3:27 PM

## 2024-07-05 NOTE — Group Note (Signed)
 Recreation Therapy Group Note   Group Topic:Communication  Group Date: 07/05/2024 Start Time: 1250 End Time: 1325 Facilitators: Celestia Jeoffrey BRAVO, LRT, CTRS Location: Dayroom  Group Description: LRT and NT, [name], passed out large Christmas bags filled with 4-6 individually wrapped presents inside for the patient to unwrap. Christmas music was being played in the background. LRT, NT, and patients discussed past Christmas traditions and fun memories they have made while communicating with one another in the dayroom.   Goal Area(s) addressed: Patient will increase communication.  Patient will reminisce on past memory Patient will practice gratitude.    Affect/Mood: Irritable and Labile   Participation Level: Minimal    Clinical Observations/Individualized Feedback: Valerie [name] was present in group. Pt was irritable.   Plan: Continue to engage patient in RT group sessions 2-3x/week.   Jeoffrey BRAVO Celestia, LRT, CTRS 07/05/2024 1:45 PM

## 2024-07-05 NOTE — Progress Notes (Signed)
" °   07/05/24 1050  Spiritual Encounters  Type of Visit Follow up  Care provided to: Patient  Referral source Chaplain assessment  Reason for visit Routine spiritual support  OnCall Visit No   Wjile rounding on unit I met [name] in dayroom. I greeted her and she remembered me from past visits.  Valerie [name] shared current frustration with an aspect of her care needs. I provided active listening and affirmed the feelings that likely come with that experience, while reassuring of the care that needed. I encouraged her patience while coping with discomfort. I offered a prayer with her consent and words of encouragement.  Valerie [name] L. Valerie [name] HERO.Div "

## 2024-07-05 NOTE — Group Note (Signed)
 Date:  07/05/2024 Time:  9:30 PM  Group Topic/Focus:  Wrap-Up Group:   The focus of this group is to help patients review their daily goal of treatment and discuss progress on daily workbooks.    Participation Level:  Active  Participation Quality:  Appropriate  Affect:  Appropriate  Cognitive:  Appropriate  Insight: Appropriate  Engagement in Group:  Engaged  Modes of Intervention:  Discussion  Additional Comments:    Valerie [name] CHRISTELLA Bunker 07/05/2024, 9:30 PM

## 2024-07-05 NOTE — Progress Notes (Signed)
" °   07/05/24 1200  Psych Admission Type (Psych Patients Only)  Admission Status Voluntary  Psychosocial Assessment  Patient Complaints Restlessness  Eye Contact Watchful  Facial Expression Animated  Affect Labile  Speech Argumentative  Interaction Defensive;Demanding  Motor Activity Restless  Appearance/Hygiene Unremarkable  Behavior Characteristics Intrusive  Mood Labile  Thought Process  Coherency Tangential  Content Preoccupation  Delusions Grandeur  Perception Hallucinations  Hallucination Auditory  Judgment Impaired  Confusion Mild  Danger to Self  Current suicidal ideation? Denies  Agreement Not to Harm Self Yes  Description of Agreement verbal  Danger to Others  Danger to Others None reported or observed    "

## 2024-07-05 NOTE — Group Note (Signed)
 Date:  07/05/2024 Time:  2:03 PM  Group Topic/Focus:  Self Care:   The focus of this group is to help patients understand the importance of self-care in order to improve or restore emotional, physical, spiritual, interpersonal, and financial health.    Participation Level:  Active  Participation Quality:  Appropriate and Attentive  Affect:  Defensive, Irritable, and Not Congruent  Cognitive:  Alert, Confused, and Delusional  Insight: Appropriate  Engagement in Group:  Defensive and Off Topic  Modes of Intervention:  Activity, Orientation, and Socialization  Additional Comments:  Christmas party    Maglione,Carmyn Hamm E 07/05/2024, 2:03 PM

## 2024-07-05 NOTE — Progress Notes (Signed)
 [facility] MD Progress Note  07/05/2024 11:56 AM Valerie [name]  MRN:  [id number]   Valerie [name] is a 63 year old female presenting voluntary to [facility] requesting a psychiatric evaluation for worsening psychosis. Patient has history of schizophrenia. Patient denied SI, HI, psychosis and alcohol/drug usage. Patient resides at [facility]. Patient reports people are coming by my door talking loud, yelling and screaming and coming in my room with keys. Patient reports having multiple medical Afibs and blood clots due to this. Patient reports people are doing things in the dining room. When asked if she called EMS, patient stated I called EMS, I stripped searched over the phone and called the authorities. Patient is admitted to [facility] unit with Q15 min safety monitoring. Multidisciplinary team approach is offered. Medication management; group/milieu therapy is offered.   Subjective:  Chart reviewed, case discussed in multidisciplinary meeting, patient seen during rounds.    Patient is noted to be sitting in the day area.  She reports that she is very happy today and informs the provider that she slept through the night.  She reports that she took Geodon  and denies having any side effects.  She reports that her mind is clearing up and is able to think clearly.  She denies SI/HI/plan and denies hallucinations.  Per nursing patient is less intrusive today and took her medications.   Able to understand medical problem-NO  Able to understand proposed treatment -NO   Able to understand alternative to proposed treatment (if any) -NO   Able to understand option of refusing proposed treatment (including withholding or withdrawing proposed treatment)-NO   Able to appreciate reasonably foreseeable consequences of accepting proposed treatment -NO  After thorough assessment, it is our clinical opinion-  Capacity is not competency. Competency is determined by legal system and  judge. Capacity can vary from time to time depending on the mental status of the patient.  Past Psychiatric History: see h&P Family History:  Family History  Problem Relation Age of Onset   Colon cancer Neg Hx    Celiac disease Neg Hx    Inflammatory bowel disease Neg Hx    Social History:  Social History   Substance and Sexual Activity  Alcohol Use Never     Social History   Substance and Sexual Activity  Drug Use Never    Social History   Socioeconomic History   Marital status: Single    Spouse name: Not on file   Number of children: Not on file   Years of education: Not on file   Highest education level: Not on file  Occupational History   Not on file  Tobacco Use   Smoking status: Never   Smokeless tobacco: Never  Substance and Sexual Activity   Alcohol use: Never   Drug use: Never   Sexual activity: Not Currently    Birth control/protection: Pill  Other Topics Concern   Not on file  Social History Narrative   Not on file   Social Drivers of Health   Tobacco Use: Low Risk (06/19/2024)   Patient History    Smoking Tobacco Use: Never    Smokeless Tobacco Use: Never    Passive Exposure: Not on file  Financial Resource Strain: Not on file  Food Insecurity: Unknown (06/19/2024)   Epic    Worried About Programme Researcher, Broadcasting/film/video in the Last Year: Never true    [employer] of Food in the Last Year: Patient declined  Transportation Needs: No Transportation  Needs (06/19/2024)   Epic    Lack of Transportation (Medical): No    Lack of Transportation (Non-Medical): No  Physical Activity: Not on file  Stress: Not on file  Social Connections: Not on file  Depression (PHQ2-9): Low Risk (12/21/2022)   Depression (PHQ2-9)    PHQ-2 Score: 0  Alcohol Screen: Low Risk (06/19/2024)   Alcohol Screen    Last Alcohol Screening Score (AUDIT): 0  Housing: Low Risk (06/19/2024)   Epic    Unable to Pay for Housing in the Last Year: No    Number of Times Moved in the Last Year: 0     Homeless in the Last Year: No  Utilities: Not At Risk (06/19/2024)   Epic    Threatened with loss of utilities: No  Health Literacy: Not on file   Past Medical History:  Past Medical History:  Diagnosis Date   Chronic lower extremity pain (2ry area of Pain) (Right) 12/21/2022   Chronic pain syndrome 12/20/2022   DDD (degenerative disc disease), 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 Tobie: Pancolonic diverticulosis, tortuous colon, next colonoscopy 2024   TONSILLECTOMY      Current Medications: Current Facility-Administered Medications  Medication Dose Route Frequency Provider Last Rate Last Admin   acetaminophen  (TYLENOL ) tablet 650 mg  650 mg Oral Q6H PRN Onuoha, Chinwendu V, NP   650 mg at 07/02/24 0559   alum & mag hydroxide-simeth (MAALOX/MYLANTA) 200-200-20 MG/5ML suspension 30 mL  30 mL Oral Q4H PRN Bobbitt, Shalon E, NP   30 mL at 07/04/24 2250   aspirin  EC tablet 81 mg  81 mg Oral Daily Bobbitt, Shalon E, NP   81 mg at 07/05/24 0930   bisacodyl  (DULCOLAX) EC tablet 10 mg  10 mg Oral Daily PRN Akita Maxim, MD       cholecalciferol  (VITAMIN D3) 25 MCG (1000 UNIT) tablet 1,000 Units  1,000 Units Oral Weekly Idella Lamontagne, MD   1,000 Units at 07/04/24 1201   cloZAPine  (CLOZARIL ) tablet 100 mg  100 mg Oral BID Alexiah Koroma, MD   100 mg at 07/05/24 9066   Or   OLANZapine  (ZYPREXA ) injection 10 mg  10 mg Intramuscular BID Tameisha Covell, MD       diclofenac  Sodium (VOLTAREN ) 1 % topical gel 4 g  4 g Topical QID Donnelly Mellow, MD   4 g at 07/04/24 2122   DULoxetine  (CYMBALTA ) DR capsule 40 mg  40 mg Oral Daily Brianda Beitler, MD   40 mg at 07/05/24 0930   famotidine  (PEPCID ) tablet 20 mg  20 mg Oral Daily Latravia Southgate, MD   20 mg at 07/05/24 9068   fluticasone  (FLONASE ) 50 MCG/ACT nasal spray 1 spray  1 spray Each Nare  Daily Bobbitt, Shalon E, NP       gabapentin  (NEURONTIN ) capsule 300 mg  300 mg Oral TID Bobbitt, Shalon E, NP   300 mg at 07/05/24 0932   hydrochlorothiazide  (HYDRODIURIL ) tablet 25 mg  25 mg Oral Daily Sharalyn Lomba, MD   25 mg at 07/05/24 0932   hydrOXYzine  (ATARAX ) tablet 25 mg  25 mg Oral TID PRN Bobbitt, Shalon E, NP   25 mg at 06/26/24 0731   linagliptin  (TRADJENTA ) tablet 5 mg  5 mg Oral Daily Bobbitt, Shalon E, NP   5 mg at 07/05/24  9067   lisinopril  (ZESTRIL ) tablet 30 mg  30 mg Oral Daily Bobbitt, Shalon E, NP   30 mg at 07/04/24 9049   magnesium  hydroxide (MILK OF MAGNESIA) suspension 30 mL  30 mL Oral Daily PRN Bobbitt, Shalon E, NP       metFORMIN  (GLUCOPHAGE ) tablet 1,000 mg  1,000 mg Oral Q supper Bobbitt, Shalon E, NP   1,000 mg at 07/04/24 1626   multivitamin with minerals tablet 1 tablet  1 tablet Oral Daily Bobbitt, Shalon E, NP   1 tablet at 07/05/24 0931   naproxen  (NAPROSYN ) tablet 375 mg  375 mg Oral TID WC Sandralee Tarkington, MD   375 mg at 07/05/24 0931   OLANZapine  (ZYPREXA ) injection 5 mg  5 mg Intramuscular TID PRN Bobbitt, Shalon E, NP   5 mg at 06/22/24 2259   OLANZapine  zydis (ZYPREXA ) disintegrating tablet 5 mg  5 mg Oral TID PRN Bobbitt, Shalon E, NP   5 mg at 07/02/24 2123   ondansetron  (ZOFRAN ) tablet 4 mg  4 mg Oral Q8H PRN Bobbitt, Shalon E, NP       potassium chloride  SA (KLOR-CON  M) CR tablet 20 mEq  20 mEq Oral BID Bobbitt, Shalon E, NP   20 mEq at 07/05/24 0932   propranolol  (INDERAL ) tablet 40 mg  40 mg Oral BID Bobbitt, Shalon E, NP   40 mg at 07/05/24 9065   rosuvastatin  (CRESTOR ) tablet 5 mg  5 mg Oral Daily Bobbitt, Shalon E, NP   5 mg at 07/05/24 0932   traZODone  (DESYREL ) tablet 50 mg  50 mg Oral QHS PRN Bobbitt, Shalon E, NP       ziprasidone  (GEODON ) capsule 20 mg  20 mg Oral QHS Merrill Deanda, MD   20 mg at 07/04/24 2121   zolpidem  (AMBIEN ) tablet 10 mg  10 mg Oral QHS Santhosh Gulino, MD   10 mg at 06/30/24 2213    Lab Results:  No results  found for this or any previous visit (from the past 48 hours).     Blood Alcohol level:  Lab Results  Component Value Date   [facility] <15 06/18/2024    Metabolic Disorder Labs: Lab Results  Component Value Date   HGBA1C 5.9 (H) 06/28/2024   MPG 122.63 06/28/2024   No results found for: PROLACTIN Lab Results  Component Value Date   CHOL 156 06/28/2024   TRIG 89 06/28/2024   HDL 70 06/28/2024   CHOLHDL 2.2 06/28/2024   VLDL 18 06/28/2024   LDLCALC 69 06/28/2024    Physical Findings: AIMS:  , ,  ,  ,    CIWA:    COWS:      Psychiatric Specialty Exam:  Presentation  General Appearance:  Casual  Eye Contact: Fleeting  Speech: Normal Rate  Speech Volume: Increased    Mood and Affect  Mood: Euphoric  Affect: Labile   Thought Process  Thought Processes: Disorganized  Orientation:Full (Time, Place and Person)  Thought Content:Illogical; Delusions; Tangential  Hallucinations: Denies  Ideas of Reference:Delusions  Suicidal Thoughts: Denies  Homicidal Thoughts: Denies   Sensorium  Memory: Immediate Fair; Remote Fair  Judgment: Impaired  Insight: Shallow   Executive Functions  Concentration: Poor  Attention Span: Poor  Recall: [employer] of Knowledge: Fair  Language: Fair   Psychomotor Activity  Psychomotor Activity: No data recorded  Musculoskeletal: Strength & Muscle Tone: within normal limits Gait & Station: normal Assets  Assets: Manufacturing Systems Engineer; Desire for Improvement    Physical  Exam: Physical Exam Vitals and nursing note reviewed.    ROS Blood pressure (!) 108/55, pulse (!) 108, temperature 98.1 F (36.7 C), resp. rate 16, height 5' 5 (1.651 m), weight 81 kg, SpO2 100%. Body mass index is 29.7 kg/m.  Diagnosis: Active Problems:   Schizoaffective disorder, bipolar type [facility])  Clinical Decision Making: Patient is currently admitted for worsening psychosis, manic symptoms, tangential  grandiosity.  She needs inpatient hospitalization for medication management and close monitoring.  Patient is unable to make decisions on her own as she is unable to appreciate and understand her mental health history, the current need for treatment.  [facility] has screened her case and for further evaluation for legal guardianship. [facility] screened in her case. Sister and mom are involved in her care.  Treatment Plan Summary:  Safety and Monitoring: If patient meets criteria for nonemergent forced medication we will need to change her status to involuntary commitment             -- Voluntary admission to inpatient psychiatric unit for safety, stabilization and treatment             -- Daily contact with patient to assess and evaluate symptoms and progress in treatment             -- Patient's case to be discussed in multi-disciplinary team meeting             -- Observation Level: q15 minute checks             -- Vital signs:  q12 hours             -- Precautions: suicide, elopement, and assault   2. Psychiatric Diagnoses and Treatment:  Discontinue Depakote  as patient is noncompliant Added Geodon  20 mg nightly as patient is agreeable to take Geodon  Clozaril  100 mg twice daily enforced with zyprexa  10mg  BID- Cymbalta  40 mg daily   -- The risks/benefits/side-effects/alternatives to this medication were discussed in detail with the patient and time was given for questions. The patient consents to medication trial.                -- Metabolic profile and EKG monitoring obtained while on an atypical antipsychotic (BMI: Lipid Panel: HbgA1c: QTc:)              -- Encouraged patient to participate in unit milieu and in scheduled group therapies                            3. Medical Issues Being Addressed:   4. Discharge Planning:   -- Social work and case management to assist with discharge planning and identification of hospital follow-up needs prior to discharge  -- Estimated LOS: 3-4 days  Allyn Foil, MD 07/05/2024, 11:56 AM

## 2024-07-06 DIAGNOSIS — F250 Schizoaffective disorder, bipolar type: Secondary | ICD-10-CM | POA: Diagnosis not present

## 2024-07-06 LAB — CBC WITH DIFFERENTIAL/PLATELET
Abs Immature Granulocytes: 0.02 K/uL (ref 0.00–0.07)
Basophils Absolute: 0.1 K/uL (ref 0.0–0.1)
Basophils Relative: 1 %
Eosinophils Absolute: 0.2 K/uL (ref 0.0–0.5)
Eosinophils Relative: 4 %
HCT: 34 % — ABNORMAL LOW (ref 36.0–46.0)
Hemoglobin: 11.2 g/dL — ABNORMAL LOW (ref 12.0–15.0)
Immature Granulocytes: 0 %
Lymphocytes Relative: 34 %
Lymphs Abs: 2.1 K/uL (ref 0.7–4.0)
MCH: 28.3 pg (ref 26.0–34.0)
MCHC: 32.9 g/dL (ref 30.0–36.0)
MCV: 85.9 fL (ref 80.0–100.0)
Monocytes Absolute: 0.6 K/uL (ref 0.1–1.0)
Monocytes Relative: 10 %
Neutro Abs: 3.1 K/uL (ref 1.7–7.7)
Neutrophils Relative %: 51 %
Platelets: 337 K/uL (ref 150–400)
RBC: 3.96 MIL/uL (ref 3.87–5.11)
RDW: 13.5 % (ref 11.5–15.5)
WBC: 6.2 K/uL (ref 4.0–10.5)
nRBC: 0 % (ref 0.0–0.2)

## 2024-07-06 NOTE — Group Note (Signed)
 Date:  07/06/2024 Time:  8:41 PM  Group Topic/Focus:  Goals Group:   The focus of this group is to help patients establish daily goals to achieve during treatment and discuss how the patient can incorporate goal setting into their daily lives to aide in recovery. Personal Choices and Values:   The focus of this group is to help patients assess and explore the importance of values in their lives, how their values affect their decisions, how they express their values and what opposes their expression. Spirituality:   The focus of this group is to discuss how one's spirituality can aide in recovery.    Participation Level:  Active  Participation Quality:  Attentive  Affect:  Appropriate  Cognitive:  Appropriate  Insight: Appropriate  Engagement in Group:  Improving  Modes of Intervention:  Discussion  Additional Comments:  Pt attended group and participated in group discussion  Jamai Dolce E Rehanna Oloughlin 07/06/2024, 8:41 PM

## 2024-07-06 NOTE — Progress Notes (Signed)
 D:Pt is alert and oriented with confusion. Affect flat. Mood irritable, dismissive and angry. Delusional and grandiose. Upon approach for assessment, pt states I don't want to talk to you, and reported already speaking with the doctor, referring to female nurse. Later, pt became argumentative regarding scheduled PO medications, said I'm only taking my medications from the doctor. Pt refused all po medications including Clozaril , states she already taken all her medications earlier today and that she wasn't taking anymore!  A: Pt redirected and informed that nursing staff administer prescribed medications. Verbal de-escalation and education provided. Pt continued to refuse PO Clozaril , IM Zyprexa  10 mg administered per provider order. Security notified for show of support.   R: Pt continue to refuse PO medications despite redirection. IM injection administered. Tol well.  T: Pt educated on medication administration and nursing role. Education not receptive at this time.

## 2024-07-06 NOTE — Plan of Care (Signed)
  Problem: Self-Concept: Goal: Ability to identify factors that promote anxiety will improve Outcome: Not Progressing Goal: Level of anxiety will decrease Outcome: Not Progressing Goal: Ability to modify response to factors that promote anxiety will improve Outcome: Not Progressing

## 2024-07-06 NOTE — Progress Notes (Signed)
 [facility] MD Progress Note  07/06/2024 12:36 PM Valerie [name]  MRN:  [id number]   Valerie [name] is a 63 year old female presenting voluntary to [facility] requesting a psychiatric evaluation for worsening psychosis. Patient has history of schizophrenia. Patient denied SI, HI, psychosis and alcohol/drug usage. Patient resides at [facility]. Patient reports people are coming by my door talking loud, yelling and screaming and coming in my room with keys. Patient reports having multiple medical Afibs and blood clots due to this. Patient reports people are doing things in the dining room. When asked if she called EMS, patient stated I called EMS, I stripped searched over the phone and called the authorities. Patient is admitted to [facility] unit with Q15 min safety monitoring. Multidisciplinary team approach is offered. Medication management; group/milieu therapy is offered.   Subjective:  Chart reviewed, case discussed in multidisciplinary meeting, patient seen during rounds.  Patient is noted to be in a good mood today.  Patient denies any complaints.  Patient reports last night getting into argument with the staff and complains about nursing staff giving her Zyprexa  intoxication through injection.  Patient denies SI/HI/plan and denies hallucinations.  Patient reports taking her Geodon  at night.  She continues to demand to be released today from the hospital saying she has 2 families her old family new family.  When provider asked about her new family she smiles at the provider and states it is you .    Able to understand medical problem-NO  Able to understand proposed treatment -NO   Able to understand alternative to proposed treatment (if any) -NO   Able to understand option of refusing proposed treatment (including withholding or withdrawing proposed treatment)-NO   Able to appreciate reasonably foreseeable consequences of accepting proposed treatment -NO  After  thorough assessment, it is our clinical opinion-  Capacity is not competency. Competency is determined by legal system and judge. Capacity can vary from time to time depending on the mental status of the patient.  Past Psychiatric History: see h&P Family History:  Family History  Problem Relation Age of Onset   Colon cancer Neg Hx    Celiac disease Neg Hx    Inflammatory bowel disease Neg Hx    Social History:  Social History   Substance and Sexual Activity  Alcohol Use Never     Social History   Substance and Sexual Activity  Drug Use Never    Social History   Socioeconomic History   Marital status: Single    Spouse name: Not on file   Number of children: Not on file   Years of education: Not on file   Highest education level: Not on file  Occupational History   Not on file  Tobacco Use   Smoking status: Never   Smokeless tobacco: Never  Substance and Sexual Activity   Alcohol use: Never   Drug use: Never   Sexual activity: Not Currently    Birth control/protection: Pill  Other Topics Concern   Not on file  Social History Narrative   Not on file   Social Drivers of Health   Tobacco Use: Low Risk (06/19/2024)   Patient History    Smoking Tobacco Use: Never    Smokeless Tobacco Use: Never    Passive Exposure: Not on file  Financial Resource Strain: Not on file  Food Insecurity: Unknown (06/19/2024)   Epic    Worried About Programme Researcher, Broadcasting/film/video in the Last Year: Never true  Ran Out of Food in the Last Year: Patient declined  Transportation Needs: No Transportation Needs (06/19/2024)   Epic    Lack of Transportation (Medical): No    Lack of Transportation (Non-Medical): No  Physical Activity: Not on file  Stress: Not on file  Social Connections: Not on file  Depression (PHQ2-9): Low Risk (12/21/2022)   Depression (PHQ2-9)    PHQ-2 Score: 0  Alcohol Screen: Low Risk (06/19/2024)   Alcohol Screen    Last Alcohol Screening Score (AUDIT): 0  Housing: Low Risk  (06/19/2024)   Epic    Unable to Pay for Housing in the Last Year: No    Number of Times Moved in the Last Year: 0    Homeless in the Last Year: No  Utilities: Not At Risk (06/19/2024)   Epic    Threatened with loss of utilities: No  Health Literacy: Not on file   Past Medical History:  Past Medical History:  Diagnosis Date   Chronic lower extremity pain (2ry area of Pain) (Right) 12/21/2022   Chronic pain syndrome 12/20/2022   DDD (degenerative disc disease), 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 Tobie: Pancolonic diverticulosis, tortuous colon, next colonoscopy 2024   TONSILLECTOMY      Current Medications: Current Facility-Administered Medications  Medication Dose Route Frequency Provider Last Rate Last Admin   acetaminophen  (TYLENOL ) tablet 650 mg  650 mg Oral Q6H PRN Onuoha, Chinwendu V, NP   650 mg at 07/02/24 0559   alum & mag hydroxide-simeth (MAALOX/MYLANTA) 200-200-20 MG/5ML suspension 30 mL  30 mL Oral Q4H PRN Bobbitt, Shalon E, NP   30 mL at 07/05/24 1555   aspirin  EC tablet 81 mg  81 mg Oral Daily Bobbitt, Shalon E, NP   81 mg at 07/06/24 9071   bisacodyl  (DULCOLAX) EC tablet 10 mg  10 mg Oral Daily PRN Judas Mohammad, MD       cholecalciferol  (VITAMIN D3) 25 MCG (1000 UNIT) tablet 1,000 Units  1,000 Units Oral Weekly Kendalyn Cranfield, MD   1,000 Units at 07/04/24 1201   cloZAPine  (CLOZARIL ) tablet 100 mg  100 mg Oral BID Mayerly Kaman, MD   100 mg at 07/06/24 9076   Or   OLANZapine  (ZYPREXA ) injection 10 mg  10 mg Intramuscular BID Gwen Sarvis, MD   10 mg at 07/05/24 2233   diclofenac  Sodium (VOLTAREN ) 1 % topical gel 4 g  4 g Topical QID Donnelly Mellow, MD   4 g at 07/05/24 2210   DULoxetine  (CYMBALTA ) DR capsule 40 mg  40 mg Oral Daily Zury Fazzino, MD   40 mg at 07/06/24 9071   famotidine  (PEPCID ) tablet 20  mg  20 mg Oral Daily Jhade Berko, MD   20 mg at 07/06/24 9074   fluticasone  (FLONASE ) 50 MCG/ACT nasal spray 1 spray  1 spray Each Nare Daily Bobbitt, Shalon E, NP       gabapentin  (NEURONTIN ) capsule 300 mg  300 mg Oral TID Bobbitt, Shalon E, NP   300 mg at 07/06/24 0928   hydrochlorothiazide  (HYDRODIURIL ) tablet 25 mg  25 mg Oral Daily Quan Cybulski, MD   25 mg at 07/06/24 9075   hydrOXYzine  (ATARAX ) tablet 25 mg  25 mg Oral TID PRN Bobbitt, Shalon E, NP   25 mg at 06/26/24 0731   linagliptin  (TRADJENTA )  tablet 5 mg  5 mg Oral Daily Bobbitt, Shalon E, NP   5 mg at 07/06/24 9074   lisinopril  (ZESTRIL ) tablet 30 mg  30 mg Oral Daily Bobbitt, Shalon E, NP   30 mg at 07/06/24 9077   magnesium  hydroxide (MILK OF MAGNESIA) suspension 30 mL  30 mL Oral Daily PRN Bobbitt, Shalon E, NP       metFORMIN  (GLUCOPHAGE ) tablet 1,000 mg  1,000 mg Oral Q supper Bobbitt, Shalon E, NP   1,000 mg at 07/05/24 1556   multivitamin with minerals tablet 1 tablet  1 tablet Oral Daily Bobbitt, Shalon E, NP   1 tablet at 07/06/24 9071   naproxen  (NAPROSYN ) tablet 375 mg  375 mg Oral TID WC Kaylla Cobos, MD   375 mg at 07/06/24 9196   OLANZapine  (ZYPREXA ) injection 5 mg  5 mg Intramuscular TID PRN Bobbitt, Shalon E, NP   5 mg at 06/22/24 2259   OLANZapine  zydis (ZYPREXA ) disintegrating tablet 5 mg  5 mg Oral TID PRN Bobbitt, Shalon E, NP   5 mg at 07/02/24 2123   ondansetron  (ZOFRAN ) tablet 4 mg  4 mg Oral Q8H PRN Bobbitt, Shalon E, NP       potassium chloride  SA (KLOR-CON  M) CR tablet 20 mEq  20 mEq Oral BID Bobbitt, Shalon E, NP   20 mEq at 07/06/24 9074   propranolol  (INDERAL ) tablet 40 mg  40 mg Oral BID Bobbitt, Shalon E, NP   40 mg at 07/06/24 9075   rosuvastatin  (CRESTOR ) tablet 5 mg  5 mg Oral Daily Bobbitt, Shalon E, NP   5 mg at 07/06/24 9076   traZODone  (DESYREL ) tablet 50 mg  50 mg Oral QHS PRN Bobbitt, Shalon E, NP       ziprasidone  (GEODON ) capsule 20 mg  20 mg Oral QHS Torren Maffeo, MD   20 mg at  07/04/24 2121   zolpidem  (AMBIEN ) tablet 10 mg  10 mg Oral QHS Emmamarie Kluender, MD   10 mg at 06/30/24 2213    Lab Results:  Results for orders placed or performed during the hospital encounter of 06/19/24 (from the past 48 hours)  CBC with Differential/Platelet     Status: Abnormal   Collection Time: 07/06/24  7:39 AM  Result Value Ref Range   WBC 6.2 4.0 - 10.5 K/uL   RBC 3.96 3.87 - 5.11 MIL/uL   Hemoglobin 11.2 (L) 12.0 - 15.0 g/dL   HCT 65.9 (L) 63.9 - 53.9 %   MCV 85.9 80.0 - 100.0 fL   MCH 28.3 26.0 - 34.0 pg   MCHC 32.9 30.0 - 36.0 g/dL   RDW 86.4 88.4 - 84.4 %   Platelets 337 150 - 400 K/uL   nRBC 0.0 0.0 - 0.2 %   Neutrophils Relative % 51 %   Neutro Abs 3.1 1.7 - 7.7 K/uL   Lymphocytes Relative 34 %   Lymphs Abs 2.1 0.7 - 4.0 K/uL   Monocytes Relative 10 %   Monocytes Absolute 0.6 0.1 - 1.0 K/uL   Eosinophils Relative 4 %   Eosinophils Absolute 0.2 0.0 - 0.5 K/uL   Basophils Relative 1 %   Basophils Absolute 0.1 0.0 - 0.1 K/uL   Immature Granulocytes 0 %   Abs Immature Granulocytes 0.02 0.00 - 0.07 K/uL    Comment: Performed at [facility], [address] [place], [place] [address]       Blood Alcohol level:  Lab Results  Component Value Date  ETH <15 06/18/2024    Metabolic Disorder Labs: Lab Results  Component Value Date   HGBA1C 5.9 (H) 06/28/2024   MPG 122.63 06/28/2024   No results found for: PROLACTIN Lab Results  Component Value Date   CHOL 156 06/28/2024   TRIG 89 06/28/2024   HDL 70 06/28/2024   CHOLHDL 2.2 06/28/2024   VLDL 18 06/28/2024   LDLCALC 69 06/28/2024    Physical Findings: AIMS:  , ,  ,  ,    CIWA:    COWS:      Psychiatric Specialty Exam:  Presentation  General Appearance:  Casual  Eye Contact: Fleeting  Speech: Normal Rate  Speech Volume: Increased    Mood and Affect  Mood: Euphoric  Affect: Labile   Thought Process  Thought Processes: Disorganized  Orientation:Full (Time,  Place and Person)  Thought Content:Illogical; Delusions; Tangential  Hallucinations: Denies  Ideas of Reference:Delusions  Suicidal Thoughts: Denies  Homicidal Thoughts: Denies   Sensorium  Memory: Immediate Fair; Remote Fair  Judgment: Impaired  Insight: Shallow   Executive Functions  Concentration: Poor  Attention Span: Poor  Recall: [employer] of Knowledge: Fair  Language: Fair   Psychomotor Activity  Psychomotor Activity: No data recorded  Musculoskeletal: Strength & Muscle Tone: within normal limits Gait & Station: normal Assets  Assets: Manufacturing Systems Engineer; Desire for Improvement    Physical Exam: Physical Exam Vitals and nursing note reviewed.    ROS Blood pressure (!) 109/53, pulse 93, temperature 97.9 F (36.6 C), resp. rate 14, height 5' 5 (1.651 m), weight 81 kg, SpO2 100%. Body mass index is 29.7 kg/m.  Diagnosis: Active Problems:   Schizoaffective disorder, bipolar type [facility])  Clinical Decision Making: Patient is currently admitted for worsening psychosis, manic symptoms, tangential grandiosity.  She needs inpatient hospitalization for medication management and close monitoring.  Patient is unable to make decisions on her own as she is unable to appreciate and understand her mental health history, the current need for treatment.  [facility] has screened her case and for further evaluation for legal guardianship. APS screened in her case. Sister and mom are involved in her care.  Treatment Plan Summary:  Safety and Monitoring: If patient meets criteria for nonemergent forced medication we will need to change her status to involuntary commitment             -- Voluntary admission to inpatient psychiatric unit for safety, stabilization and treatment             -- Daily contact with patient to assess and evaluate symptoms and progress in treatment             -- Patient's case to be discussed in multi-disciplinary team meeting              -- Observation Level: q15 minute checks             -- Vital signs:  q12 hours             -- Precautions: suicide, elopement, and assault   2. Psychiatric Diagnoses and Treatment:  Discontinue Depakote  as patient is noncompliant Added Geodon  20 mg nightly as patient is agreeable to take Geodon  Clozaril  100 mg twice daily enforced with zyprexa  10mg  BID- Cymbalta  40 mg daily   -- The risks/benefits/side-effects/alternatives to this medication were discussed in detail with the patient and time was given for questions. The patient consents to medication trial.                --  Metabolic profile and EKG monitoring obtained while on an atypical antipsychotic (BMI: Lipid Panel: HbgA1c: QTc:)              -- Encouraged patient to participate in unit milieu and in scheduled group therapies                            3. Medical Issues Being Addressed:   4. Discharge Planning:   -- Social work and case management to assist with discharge planning and identification of hospital follow-up needs prior to discharge  -- Estimated LOS: 3-4 days  Allyn Foil, MD 07/06/2024, 12:36 PM

## 2024-07-06 NOTE — Progress Notes (Signed)
 Pt slept 3.25 hours.

## 2024-07-06 NOTE — Progress Notes (Signed)
" °   07/06/24 1000  Psych Admission Type (Psych Patients Only)  Admission Status Voluntary  Psychosocial Assessment  Patient Complaints Suspiciousness  Eye Contact Suspiciousness  Facial Expression Animated  Affect Labile  Speech Argumentative;Slow  Interaction Defensive;Demanding  Motor Activity Restless  Appearance/Hygiene Unremarkable  Behavior Characteristics Intrusive;Resistant to care  Mood Irritable  Thought Process  Coherency Tangential  Content Preoccupation  Delusions Grandeur;Paranoid  Perception Hallucinations  Hallucination Auditory  Judgment Impaired  Confusion Mild  Danger to Self  Current suicidal ideation? Denies  Danger to Others  Danger to Others None reported or observed   Patient accepted all medications this shift. PRN X1 for indigestion.  "

## 2024-07-06 NOTE — Group Note (Signed)
 Date:  07/06/2024 Time:  10:36 AM  Group Topic/Focus:  Christmas Movie     Participation Level:  Active  Participation Quality:  Appropriate  Affect:  Appropriate  Cognitive:  Appropriate  Insight: Appropriate  Engagement in Group:  Engaged  Modes of Intervention:  Activity and Socialization  Additional Comments:  none  [name] 07/06/2024, 10:36 AM

## 2024-07-06 NOTE — Group Note (Signed)
 Date:  07/06/2024 Time:  3:44 PM  Group Topic/Focus:  Self Care:   The focus of this group is to help patients understand the importance of self-care in order to improve or restore emotional, physical, spiritual, interpersonal, and financial health.  Meditation offers significant benefits for geriatric patients, including boosting cognitive health (memory, focus), improving emotional well-being (reducing stress, anxiety, depression, loneliness), managing chronic pain, lowering blood pressure, strengthening the immune system, and enhancing physical balance and flexibility through gentle movements like Tai Chi, all while supporting brain plasticity and overall quality of life as they age.   Participation Level:  Active  Participation Quality:  Appropriate  Affect:  Appropriate  Cognitive:  Appropriate  Insight: Appropriate  Engagement in Group:  Engaged  Modes of Intervention:  Activity  Additional Comments:  N/A  [name] 07/06/2024, 3:44 PM

## 2024-07-06 NOTE — Plan of Care (Signed)
  Problem: Education: Goal: Ability to state activities that reduce stress will improve Outcome: Progressing   Problem: Self-Concept: Goal: Ability to identify factors that promote anxiety will improve Outcome: Progressing Goal: Level of anxiety will decrease Outcome: Progressing Goal: Ability to modify response to factors that promote anxiety will improve Outcome: Progressing   

## 2024-07-06 NOTE — Progress Notes (Signed)
" °   07/05/24 2200  Psych Admission Type (Psych Patients Only)  Admission Status Voluntary  Psychosocial Assessment  Patient Complaints Irritability;Suspiciousness  Eye Contact Suspiciousness  Facial Expression Animated  Affect Flat  Speech Argumentative  Interaction Defensive;Demanding  Motor Activity Slow  Appearance/Hygiene Unremarkable  Behavior Characteristics Irritable;Resistant to care  Mood Irritable  Thought Process  Coherency Tangential  Content Preoccupation  Delusions Grandeur;Paranoid  Perception Hallucinations  Hallucination Auditory  Judgment Impaired  Confusion Mild  Danger to Self  Current suicidal ideation? Denies    "

## 2024-07-06 NOTE — Group Note (Signed)
 [facility] LCSW Group Therapy Note   Group Date: 07/06/2024 Start Time: 1300 End Time: 1400  Type of Therapy/Topic:  Group Therapy:  Feelings about Diagnosis  Participation Level:  Active   Mood: Appropriate    Description of Group:    This group will allow patients to explore their thoughts and feelings about diagnoses they have received. Patients will be guided to explore their level of understanding and acceptance of these diagnoses. Facilitator will encourage patients to process their thoughts and feelings about the reactions of others to their diagnosis, and will guide patients in identifying ways to discuss their diagnosis with significant others in their lives. This group will be process-oriented, with patients participating in exploration of their own experiences as well as giving and receiving support and challenge from other group members.   Therapeutic Goals: 1. Patient will demonstrate understanding of diagnosis as evidence by identifying two or more symptoms of the disorder:  2. Patient will be able to express two feelings regarding the diagnosis 3. Patient will demonstrate ability to communicate their needs through discussion and/or role plays  Summary of Patient Progress: Participants reflected on their emotions related to the holidays, using the occasion as a chance to engage in a life review.  Patients played and sang their most cherished nostalgic songs to evoke a sense of comfort and happiness.  Patients created a supportive environment where everyone felt safe to open up, share, and explore their emotions.    Therapeutic Modalities:   Cognitive Behavioral Therapy Brief Therapy Feelings Identification    Valerie CHRISTELLA Kerns, LCSW

## 2024-07-07 DIAGNOSIS — F250 Schizoaffective disorder, bipolar type: Secondary | ICD-10-CM | POA: Diagnosis not present

## 2024-07-07 NOTE — Group Note (Signed)
 Date:  07/07/2024 Time:  9:32 PM  Group Topic/Focus:  Wrap-Up Group:   The focus of this group is to help patients review their daily goal of treatment and discuss progress on daily workbooks.    Participation Level:  Active  Participation Quality:  Appropriate  Affect:  Appropriate  Cognitive:  Appropriate  Insight: Appropriate  Engagement in Group:  Engaged  Modes of Intervention:  Discussion  Additional Comments:    Valerie [name] 07/07/2024, 9:32 PM

## 2024-07-07 NOTE — Group Note (Signed)
 Date:  07/07/2024 Time:  3:47 PM  Group Topic/Focus:  Meditation Therapy    Participation Level:  Did Not Attend    Valerie [name] Bias 07/07/2024, 3:47 PM

## 2024-07-07 NOTE — [facility] Counselor (Signed)
 CSW attempted to contact the patient's [facility] APS worker, Adams Louder, [phone].  CSW unable to speak with someone and left a HIPAA compliant voicemail.   Sherryle Margo, MSW, LCSW 07/07/2024 1:50 PM

## 2024-07-07 NOTE — Progress Notes (Signed)
" °   07/07/24 1200  Psych Admission Type (Psych Patients Only)  Admission Status Voluntary  Psychosocial Assessment  Patient Complaints Suspiciousness  Eye Contact Suspiciousness  Facial Expression Animated  Affect Inconsistent with thought content  Clinical Cytogeneticist Activity Restless  Appearance/Hygiene Unremarkable  Behavior Characteristics Intrusive  Mood Irritable  Thought Process  Coherency Tangential  Content Preoccupation  Delusions Grandeur  Perception Hallucinations  Hallucination Auditory  Judgment Impaired  Confusion Mild  Danger to Self  Current suicidal ideation? Denies  Agreement Not to Harm Self Yes  Description of Agreement verbal  Danger to Others  Danger to Others None reported or observed    "

## 2024-07-07 NOTE — Progress Notes (Signed)
 12/ 26/2025 Letter to the Court  Reg: [name] DOB: [birth year]  [name] has been hospitalized since 06/19/2024  at [facility]) inpatient psychiatric unit. She is currently being treated for Schizoaffective disorder,Bipolar type. Patient is [name], Valerie about new husband and having babies. She is refusing treatment and is on non emergent forced medications approved by 2 physicians. Patient is responding very poorly to treatment and is requiring long time to stabilize.  Patient is unable to care for self. APS report has been made regarding guardianship. As this process is time consuming patient has no safe place to go with out supervision.   We appreciate the court's assistance in stabilizing and currently we request another 30 days of inpatient care to further stabilize.  Sincerely, Twania Bujak.MD [facility]

## 2024-07-07 NOTE — Group Note (Signed)
 Date:  07/07/2024 Time:  10:42 AM  Group Topic/Focus:  Movement Therapy, Morning Stretch with Adasia Hoar.    Participation Level:  Did Not Attend    [name] 07/07/2024, 10:42 AM

## 2024-07-07 NOTE — Progress Notes (Signed)
 [facility] MD Progress Note  07/07/2024 12:38 PM Valerie [name]  MRN:  [id number]   Valerie [name] is a 63 year old female presenting voluntary to [facility] requesting a psychiatric evaluation for worsening psychosis. Patient has history of schizophrenia. Patient denied SI, HI, psychosis and alcohol/drug usage. Patient resides at [facility]. Patient reports people are coming by my door talking loud, yelling and screaming and coming in my room with keys. Patient reports having multiple medical Afibs and blood clots due to this. Patient reports people are doing things in the dining room. When asked if she called EMS, patient stated I called EMS, I stripped searched over the phone and called the authorities. Patient is admitted to [facility] unit with Q15 min safety monitoring. Multidisciplinary team approach is offered. Medication management; group/milieu therapy is offered.   Sister Dutchess Crosland : [phone]- called on 07/07/24 went to generic voicemail-sister called back and provider discussed at length the current treatment plan of patient being on Clozaril -intermittently refusing, enforced with Zyprexa  IM and recent and addition of Geodon .  Due to patient's ongoing poor response to medications and ongoing delusions discussed the plan of switching Clozaril  to another antipsychotic that comes in LAI form.  Clozaril  may not be the best option for patients who are noncompliant and refusing labs.  Sister is agreeable with the plan  Subjective:  Chart reviewed, case discussed in multidisciplinary meeting, patient seen during rounds.   On interview patient came up to the provider and continues to request discharge saying her other family is waiting for her to start the new life.  When asked about details she reports her husband is waiting for her and she talks about having 2 children of age 21 and 73 waiting for her.  She reports talking to her mom yesterday.  Per nursing patient  dresses taking her medications and will needs a lot of prompting.  She missed few doses of Clozaril  this week but most of the doses she took along with Geodon .  Will continue to monitor her response to the current medications.  She denies SI/HI/plan.   Able to understand medical problem-NO  Able to understand proposed treatment -NO   Able to understand alternative to proposed treatment (if any) -NO   Able to understand option of refusing proposed treatment (including withholding or withdrawing proposed treatment)-NO   Able to appreciate reasonably foreseeable consequences of accepting proposed treatment -NO  After thorough assessment, it is our clinical opinion-  Capacity is not competency. Competency is determined by legal system and judge. Capacity can vary from time to time depending on the mental status of the patient.  Past Psychiatric History: see h&P Family History:  Family History  Problem Relation Age of Onset   Colon cancer Neg Hx    Celiac disease Neg Hx    Inflammatory bowel disease Neg Hx    Social History:  Social History   Substance and Sexual Activity  Alcohol Use Never     Social History   Substance and Sexual Activity  Drug Use Never    Social History   Socioeconomic History   Marital status: Single    Spouse name: Not on file   Number of children: Not on file   Years of education: Not on file   Highest education level: Not on file  Occupational History   Not on file  Tobacco Use   Smoking status: Never   Smokeless tobacco: Never  Substance and Sexual Activity   Alcohol use:  Never   Drug use: Never   Sexual activity: Not Currently    Birth control/protection: Pill  Other Topics Concern   Not on file  Social History Narrative   Not on file   Social Drivers of Health   Tobacco Use: Low Risk (06/19/2024)   Patient History    Smoking Tobacco Use: Never    Smokeless Tobacco Use: Never    Passive Exposure: Not on file  Financial Resource  Strain: Not on file  Food Insecurity: Unknown (06/19/2024)   Epic    Worried About Programme Researcher, Broadcasting/film/video in the Last Year: Never true    [employer] of Food in the Last Year: Patient declined  Transportation Needs: No Transportation Needs (06/19/2024)   Epic    Lack of Transportation (Medical): No    Lack of Transportation (Non-Medical): No  Physical Activity: Not on file  Stress: Not on file  Social Connections: Not on file  Depression (PHQ2-9): Low Risk (12/21/2022)   Depression (PHQ2-9)    PHQ-2 Score: 0  Alcohol Screen: Low Risk (06/19/2024)   Alcohol Screen    Last Alcohol Screening Score (AUDIT): 0  Housing: Low Risk (06/19/2024)   Epic    Unable to Pay for Housing in the Last Year: No    Number of Times Moved in the Last Year: 0    Homeless in the Last Year: No  Utilities: Not At Risk (06/19/2024)   Epic    Threatened with loss of utilities: No  Health Literacy: Not on file   Past Medical History:  Past Medical History:  Diagnosis Date   Chronic lower extremity pain (2ry area of Pain) (Right) 12/21/2022   Chronic pain syndrome 12/20/2022   DDD (degenerative disc disease), 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 Tobie: Pancolonic diverticulosis, tortuous colon, next colonoscopy 2024   TONSILLECTOMY      Current Medications: Current Facility-Administered Medications  Medication Dose Route Frequency Provider Last Rate Last Admin   acetaminophen  (TYLENOL ) tablet 650 mg  650 mg Oral Q6H PRN Onuoha, Chinwendu V, NP   650 mg at 07/02/24 0559   alum & mag hydroxide-simeth (MAALOX/MYLANTA) 200-200-20 MG/5ML suspension 30 mL  30 mL Oral Q4H PRN Bobbitt, Shalon E, NP   30 mL at 07/07/24 0913   aspirin  EC tablet 81 mg  81 mg Oral Daily Bobbitt, Shalon E, NP   81 mg at 07/07/24 0913   bisacodyl  (DULCOLAX) EC tablet 10 mg  10 mg Oral Daily  PRN Aliah Eriksson, MD       cholecalciferol  (VITAMIN D3) 25 MCG (1000 UNIT) tablet 1,000 Units  1,000 Units Oral Weekly Chaise Mahabir, MD   1,000 Units at 07/04/24 1201   cloZAPine  (CLOZARIL ) tablet 100 mg  100 mg Oral BID Lesieli Bresee, MD   100 mg at 07/07/24 9083   Or   OLANZapine  (ZYPREXA ) injection 10 mg  10 mg Intramuscular BID Maame Dack, MD   10 mg at 07/05/24 2233   diclofenac  Sodium (VOLTAREN ) 1 % topical gel 4 g  4 g Topical QID Donnelly Mellow, MD   4 g at 07/07/24 0919   DULoxetine  (CYMBALTA ) DR capsule 40 mg  40 mg Oral Daily Jashaun Penrose, MD   40 mg at 07/07/24 0914   famotidine  (PEPCID ) tablet 20 mg  20 mg Oral  Daily Shamiyah Ngu, MD   20 mg at 07/07/24 9085   fluticasone  (FLONASE ) 50 MCG/ACT nasal spray 1 spray  1 spray Each Nare Daily Bobbitt, Shalon E, NP       gabapentin  (NEURONTIN ) capsule 300 mg  300 mg Oral TID Bobbitt, Shalon E, NP   300 mg at 07/07/24 9083   hydrochlorothiazide  (HYDRODIURIL ) tablet 25 mg  25 mg Oral Daily Jaree Dwight, MD   25 mg at 07/07/24 9083   hydrOXYzine  (ATARAX ) tablet 25 mg  25 mg Oral TID PRN Bobbitt, Shalon E, NP   25 mg at 06/26/24 9268   linagliptin  (TRADJENTA ) tablet 5 mg  5 mg Oral Daily Bobbitt, Shalon E, NP   5 mg at 07/07/24 9083   lisinopril  (ZESTRIL ) tablet 30 mg  30 mg Oral Daily Bobbitt, Shalon E, NP   30 mg at 07/07/24 9082   magnesium  hydroxide (MILK OF MAGNESIA) suspension 30 mL  30 mL Oral Daily PRN Bobbitt, Shalon E, NP       metFORMIN  (GLUCOPHAGE ) tablet 1,000 mg  1,000 mg Oral Q supper Bobbitt, Shalon E, NP   1,000 mg at 07/06/24 1652   multivitamin with minerals tablet 1 tablet  1 tablet Oral Daily Bobbitt, Shalon E, NP   1 tablet at 07/07/24 9082   naproxen  (NAPROSYN ) tablet 375 mg  375 mg Oral TID WC Addysin Porco, MD   375 mg at 07/07/24 0915   OLANZapine  (ZYPREXA ) injection 5 mg  5 mg Intramuscular TID PRN Bobbitt, Shalon E, NP   5 mg at 06/22/24 2259   OLANZapine  zydis (ZYPREXA ) disintegrating tablet  5 mg  5 mg Oral TID PRN Bobbitt, Shalon E, NP   5 mg at 07/02/24 2123   ondansetron  (ZOFRAN ) tablet 4 mg  4 mg Oral Q8H PRN Bobbitt, Shalon E, NP       potassium chloride  SA (KLOR-CON  M) CR tablet 20 mEq  20 mEq Oral BID Bobbitt, Shalon E, NP   20 mEq at 07/07/24 0916   propranolol  (INDERAL ) tablet 40 mg  40 mg Oral BID Bobbitt, Shalon E, NP   40 mg at 07/07/24 0914   rosuvastatin  (CRESTOR ) tablet 5 mg  5 mg Oral Daily Bobbitt, Shalon E, NP   5 mg at 07/07/24 0913   traZODone  (DESYREL ) tablet 50 mg  50 mg Oral QHS PRN Bobbitt, Shalon E, NP       ziprasidone  (GEODON ) capsule 20 mg  20 mg Oral QHS Nashaly Dorantes, MD   20 mg at 07/06/24 2142   zolpidem  (AMBIEN ) tablet 10 mg  10 mg Oral QHS Keyleigh Manninen, MD   10 mg at 07/06/24 2142    Lab Results:  Results for orders placed or performed during the hospital encounter of 06/19/24 (from the past 48 hours)  CBC with Differential/Platelet     Status: Abnormal   Collection Time: 07/06/24  7:39 AM  Result Value Ref Range   WBC 6.2 4.0 - 10.5 K/uL   RBC 3.96 3.87 - 5.11 MIL/uL   Hemoglobin 11.2 (L) 12.0 - 15.0 g/dL   HCT 65.9 (L) 63.9 - 53.9 %   MCV 85.9 80.0 - 100.0 fL   MCH 28.3 26.0 - 34.0 pg   MCHC 32.9 30.0 - 36.0 g/dL   RDW 86.4 88.4 - 84.4 %   Platelets 337 150 - 400 K/uL   nRBC 0.0 0.0 - 0.2 %   Neutrophils Relative % 51 %   Neutro Abs 3.1 1.7 - 7.7 K/uL  Lymphocytes Relative 34 %   Lymphs Abs 2.1 0.7 - 4.0 K/uL   Monocytes Relative 10 %   Monocytes Absolute 0.6 0.1 - 1.0 K/uL   Eosinophils Relative 4 %   Eosinophils Absolute 0.2 0.0 - 0.5 K/uL   Basophils Relative 1 %   Basophils Absolute 0.1 0.0 - 0.1 K/uL   Immature Granulocytes 0 %   Abs Immature Granulocytes 0.02 0.00 - 0.07 K/uL    Comment: Performed at [facility], [address] [place], [place] [address]       Blood Alcohol level:  Lab Results  Component Value Date   [facility] <15 06/18/2024    Metabolic Disorder Labs: Lab Results  Component Value  Date   HGBA1C 5.9 (H) 06/28/2024   MPG 122.63 06/28/2024   No results found for: PROLACTIN Lab Results  Component Value Date   CHOL 156 06/28/2024   TRIG 89 06/28/2024   HDL 70 06/28/2024   CHOLHDL 2.2 06/28/2024   VLDL 18 06/28/2024   LDLCALC 69 06/28/2024    Physical Findings: AIMS:  , ,  ,  ,    CIWA:    COWS:      Psychiatric Specialty Exam:  Presentation  General Appearance:  Casual  Eye Contact: Fleeting  Speech: Normal Rate  Speech Volume: Increased    Mood and Affect  Mood: Euphoric  Affect: Labile   Thought Process  Thought Processes: Disorganized  Orientation:Full (Time, Place and Person)  Thought Content:Illogical; Delusions; Tangential  Hallucinations: Denies  Ideas of Reference:Delusions  Suicidal Thoughts: Denies  Homicidal Thoughts: Denies   Sensorium  Memory: Immediate Fair; Remote Fair  Judgment: Impaired  Insight: Shallow   Executive Functions  Concentration: Poor  Attention Span: Poor  Recall: [employer] of Knowledge: Fair  Language: Fair   Psychomotor Activity  Psychomotor Activity: No data recorded  Musculoskeletal: Strength & Muscle Tone: within normal limits Gait & Station: normal Assets  Assets: Manufacturing Systems Engineer; Desire for Improvement    Physical Exam: Physical Exam Vitals and nursing note reviewed.    ROS Blood pressure 130/76, pulse (!) 102, temperature (!) 97.5 F (36.4 C), temperature source Temporal, resp. rate 18, height 5' 5 (1.651 m), weight 81 kg, SpO2 100%. Body mass index is 29.7 kg/m.  Diagnosis: Active Problems:   Schizoaffective disorder, bipolar type [facility])  Clinical Decision Making: Patient is currently admitted for worsening psychosis, manic symptoms, tangential grandiosity.  She needs inpatient hospitalization for medication management and close monitoring.  Patient is unable to make decisions on her own as she is unable to appreciate and understand her  mental health history, the current need for treatment.  [facility] has screened her case and for further evaluation for legal guardianship. [facility] screened in her case. Sister and mom are involved in her care.  Treatment Plan Summary:  Safety and Monitoring:             -- inoluntary admission to inpatient psychiatric unit for safety, stabilization and treatment             -- Daily contact with patient to assess and evaluate symptoms and progress in treatment             -- Patient's case to be discussed in multi-disciplinary team meeting             -- Observation Level: q15 minute checks             -- Vital signs:  q12 hours             --  Precautions: suicide, elopement, and assault   2. Psychiatric Diagnoses and Treatment:  Discontinue Depakote  as patient is noncompliant Added Geodon  20 mg nightly as patient is agreeable to take Geodon  Clozaril  100 mg twice daily enforced with zyprexa  10mg  BID- Cymbalta  40 mg daily   -- The risks/benefits/side-effects/alternatives to this medication were discussed in detail with the patient and time was given for questions. The patient consents to medication trial.                -- Metabolic profile and EKG monitoring obtained while on an atypical antipsychotic (BMI: Lipid Panel: HbgA1c: QTc:)              -- Encouraged patient to participate in unit milieu and in scheduled group therapies                            3. Medical Issues Being Addressed:   4. Discharge Planning:   -- Social work and case management to assist with discharge planning and identification of hospital follow-up needs prior to discharge  -- Estimated LOS: 3-4 days  Allyn Foil, MD 07/07/2024, 12:38 PM

## 2024-07-07 NOTE — Group Note (Deleted)
 Date:  07/07/2024 Time:  8:47 PM  Group Topic/Focus:  Overcoming Stress:   The focus of this group is to define stress and help patients assess their triggers.     Participation Level:  {BHH PARTICIPATION OZCZO:77735}  Participation Quality:  {BHH PARTICIPATION QUALITY:22265}  Affect:  {BHH AFFECT:22266}  Cognitive:  {BHH COGNITIVE:22267}  Insight: {BHH Insight2:20797}  Engagement in Group:  {BHH ENGAGEMENT IN HMNLE:77731}  Modes of Intervention:  {BHH MODES OF INTERVENTION:22269}  Additional Comments:  PIERRETTE Ginny JONETTA Orma 07/07/2024, 8:47 PM

## 2024-07-07 NOTE — Plan of Care (Signed)
" °  Problem: Education: Goal: Emotional status will improve Outcome: Progressing   Problem: Education: Goal: Mental status will improve Outcome: Progressing   Problem: Education: Goal: Verbalization of understanding the information provided will improve Outcome: Progressing   Problem: Health Behavior/Discharge Planning: Goal: Identification of resources available to assist in meeting health care needs will improve Outcome: Progressing Goal: Compliance with treatment plan for underlying cause of condition will improve Outcome: Progressing   Problem: Safety: Goal: Periods of time without injury will increase Outcome: Progressing   "

## 2024-07-07 NOTE — Progress Notes (Signed)
(  Sleep Hours) - 6.00 (Any PRNs that were needed, meds refused, or side effects to meds)- None (Any disturbances and when (visitation, over night)-Awaken from sleep ambulating to the bathroom c/o I can't walk or talk someone came into my room and gave me a shot. Reassured the patient no injections was given, and encouraged to return to sleep and rest until breakfast. (Concerns raised by the patient)- See above (SI/HI/AVH)- Denies

## 2024-07-08 DIAGNOSIS — F250 Schizoaffective disorder, bipolar type: Secondary | ICD-10-CM | POA: Diagnosis not present

## 2024-07-08 MED ORDER — FAMOTIDINE 20 MG PO TABS
40.0000 mg | ORAL_TABLET | Freq: Every day | ORAL | Status: AC
  Administered 2024-07-09 – 2024-08-18 (×40): 40 mg via ORAL
  Filled 2024-07-08 (×34): qty 2

## 2024-07-08 NOTE — Progress Notes (Signed)
" °   07/08/24 1200  Psych Admission Type (Psych Patients Only)  Admission Status Voluntary  Psychosocial Assessment  Patient Complaints None  Eye Contact Suspiciousness  Facial Expression Animated  Affect Inconsistent with thought content  Speech Slow  Interaction Demanding  Motor Activity Slow  Appearance/Hygiene Unremarkable  Behavior Characteristics Cooperative  Mood Preoccupied  Thought Process  Coherency Tangential  Content Preoccupation  Delusions Grandeur  Perception Hallucinations  Hallucination Auditory  Judgment Impaired  Confusion Mild  Danger to Self  Current suicidal ideation? Denies  Agreement Not to Harm Self Yes  Description of Agreement verbal  Danger to Others  Danger to Others None reported or observed    "

## 2024-07-08 NOTE — Progress Notes (Signed)
 Patient is an involuntary admission to [facility] for schizoaffective disorder, bipolar with mania. Patient refuses medicines at time but usually takes morning meds including the clozaril  but at night usually refuses. Her mood goes from euphoric to angry and demanding (labile). Last night she had multiple bouts of diarrhea after having mylanta q4 hrs x 3 doses.  Today her pepcid  was increased to 40 mg and the mylanta d/c'd.

## 2024-07-08 NOTE — Group Note (Signed)
 Date:  07/08/2024 Time:  10:57 AM  Group Topic/Focus:  Wellness Toolbox:   The focus of this group is to discuss various aspects of wellness, balancing those aspects and exploring ways to increase the ability to experience wellness.  Patients will create a wellness toolbox for use upon discharge.  I had the opportunity to go over the holiday and New Year activity sheets with each patient individually. The activity sheets provided patients with an opportunity to express personal memories, reflect on meaningful life experiences, and share favorite traditions, movies, and meals. Patients were also encouraged to discuss gentle New Year plans and goals, focusing on realistic and positive intentions for the upcoming year.  The activities supported cognitive engagement through word searches, memory recall, simple problem-solving, and reflection exercises. Patients were able to participate at their own pace, and assistance was provided as needed. The activity sheets also included a brief, gentle movement component, allowing patients to engage in light physical activity appropriate to their abilities.  Overall, the activity promoted emotional expression, social interaction, and mental stimulation in a calm and supportive manner. Patients appeared engaged and receptive, and the activity provided a meaningful opportunity for conversation, self-expression, and encouragement of overall well-being.  Participation Level:  Minimal  Participation Quality:  Inattentive  Affect:    Cognitive:  Disorganized  Insight: Limited  Engagement in Group:  Distracting  Modes of Intervention:  Activity and Discussion  Additional Comments:    Garielle Mroz L Pakou Rainbow 07/08/2024, 10:57 AM

## 2024-07-08 NOTE — Plan of Care (Signed)
" °  Problem: Education: Goal: Ability to state activities that reduce stress will improve Outcome: Progressing   Problem: Self-Concept: Goal: Ability to identify factors that promote anxiety will improve Outcome: Progressing Goal: Level of anxiety will decrease Outcome: Progressing Goal: Ability to modify response to factors that promote anxiety will improve Outcome: Progressing   Problem: Activity: Goal: Will verbalize the importance of balancing activity with adequate rest periods Outcome: Progressing   "

## 2024-07-08 NOTE — Group Note (Signed)
 Date:  07/08/2024 Time:  4:14 PM  Group Topic/Focus:  Fresh air Therapy with Music and Conversation    Participation Level:  Did Not Attend    Valerie [name] 07/08/2024, 4:14 PM

## 2024-07-08 NOTE — Group Note (Signed)
 LCSW Group Therapy Note   Group Date: 07/08/2024 Start Time: 1400 End Time: 1430   Type of Therapy and Topic:  Group Therapy: Managing Intrusive Thoughts  Participation Level:  Active  Description of Group: The purpose of this group is to assist patients in learning how to regulate negative intrusive thoughts.  Intrusive thoughts are unwanted thoughts or vivid images that suddenly enter your mind.  They may be disturbing to the person experiencing them and may trigger feelings like anxiety, shame, or disgust.  Emphasis will be placed on coping with negative intrusive thoughts in situations and using positive strategies to combat negative intrusive thoughts and using positive strategies combat negative thoughts.  Therapeutic Goals: 1. Patient will identify the difference of an inconsequential thought vs an urgent thought. 2.Patient will discuss when they need to seek assistance with negative intrusive thoughts.   Summary of Patient Progress:   The facilitator and patient discussed negative intrusive thoughts and their impact on daily life.  Group members chose a negative intrusive thought and strategies for managing it .  The facilitator and patient examined how different experiences can influence their thoughts.  The patient reflected on how their thoughts, negative or positive can affect outcomes.  The patient was receptive to feedback from both peers and the facilitator and contributed to creating a supportive environment, encouraging others to open up and share.   Therapeutic Modalities:  Cognitive Behavioral Therapy Dialectical Behavior Therapy   Stacye Noori W Topacio Cella, LCSWA 07/08/2024  4:20 PM

## 2024-07-08 NOTE — Progress Notes (Addendum)
 Calm and cooperative. Pt remains delusional and grandiose. Demanding at times. Visible in the milieu with puzzles and color paper. Limited interacting with peers and staff. Po med compliant. No PRNs given. Incont of bowel and redirected to bedroom for ADLs  and clothing changes with min assist.. Pt receptive to care. No behavior issues noted at this time. Denies SI/HI. Q 15 min checks maintained for safety. No c/o pain/discomfort noted. Slept 8 hours   07/07/24 2200  Psych Admission Type (Psych Patients Only)  Admission Status Voluntary  Psychosocial Assessment  Patient Complaints None  Eye Contact Suspiciousness  Facial Expression Animated  Affect Inconsistent with thought content  Speech Slow  Interaction Demanding  Motor Activity Slow  Appearance/Hygiene Unremarkable  Behavior Characteristics Cooperative  Mood Suspicious  Thought Process  Coherency Tangential  Content Preoccupation  Delusions Grandeur  Perception Hallucinations  Hallucination Auditory  Judgment Impaired  Confusion Mild  Danger to Self  Current suicidal ideation? Denies

## 2024-07-08 NOTE — Group Note (Signed)
 Date:  07/08/2024 Time:  8:44 PM  Group Topic/Focus:  Making Healthy Choices:   The focus of this group is to help patients identify negative/unhealthy choices they were using prior to admission and identify positive/healthier coping strategies to replace them upon discharge.    Participation Level:  Active  Participation Quality:  Appropriate  Affect:  Appropriate  Cognitive:  Appropriate  Insight: Good  Engagement in Group:  Engaged   Additional Comments:    Valerie [name] 07/08/2024, 8:44 PM

## 2024-07-08 NOTE — Progress Notes (Signed)
 [facility] MD Progress Note  07/08/2024 5:59 PM Valerie [name]  MRN:  [id number]   Valerie [name] is a 63 year old female presenting voluntary to [facility] requesting a psychiatric evaluation for worsening psychosis. Patient has history of schizophrenia. Patient denied SI, HI, psychosis and alcohol/drug usage. Patient resides at [facility]. Patient reports people are coming by my door talking loud, yelling and screaming and coming in my room with keys. Patient reports having multiple medical Afibs and blood clots due to this. Patient reports people are doing things in the dining room. When asked if she called EMS, patient stated I called EMS, I stripped searched over the phone and called the authorities. Patient is admitted to [facility] unit with Q15 min safety monitoring. Multidisciplinary team approach is offered. Medication management; group/milieu therapy is offered.   Sister Yevonne Yokum : [phone] called on 07/07/24 went to generic voicemail-sister called back and provider discussed at length the current treatment plan of patient being on Clozaril -intermittently refusing, enforced with Zyprexa  IM and recent and addition of Geodon .  Due to patient's ongoing poor response to medications and ongoing delusions discussed the plan of switching Clozaril  to another antipsychotic that comes in LAI form.  Clozaril  may not be the best option for patients who are noncompliant and refusing labs.  Sister is agreeable with the plan  Subjective:  Chart reviewed, case discussed in multidisciplinary meeting, patient seen during rounds.   Per nursing report, she slept approximately six hours and was noted to have bowel movements in bed. It was also reported that she had generally missed her nighttime clozapine  doses; however, she took the nighttime clozapine  dose yesterday along with her morning clozapine .  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 She appeared excessively happy with a labile mood.  She reported a history of bipolar disorder with episodes of euphoria and described herself as an optimist and generally happy person. She denied auditory or visual hallucinations, receiving special messages, feeling that others could read her mind, or experiencing drooling. She denied suicidal or homicidal ideation and stated her memory was good. Sleep the prior night was approximately five to six hours.  She reported a history of excessive diarrhea, which could be attributed to taking too much Mylanta. She was currently taking famotidine  for gastrointestinal symptoms. She denied current medication side effects, aside from dry nasal secretions that resolved approximately eight days prior.  Past Psychiatric History: see h&P Family History:  Family History  Problem Relation Age of Onset   Colon cancer Neg Hx    Celiac disease Neg Hx    Inflammatory bowel disease Neg Hx    Social History:  Social History   Substance and Sexual Activity  Alcohol Use Never     Social History   Substance and Sexual Activity  Drug Use Never    Social History   Socioeconomic History   Marital status: Single    Spouse name: Not on file   Number of children: Not on file   Years of education: Not on file   Highest education level: Not on file  Occupational History   Not on file  Tobacco Use   Smoking status: Never   Smokeless tobacco: Never  Substance and Sexual Activity   Alcohol use: Never   Drug use: Never   Sexual activity: Not Currently    Birth control/protection: Pill  Other Topics Concern   Not on file  Social History Narrative   Not on file   Social Drivers of Health   Tobacco Use: Low Risk (06/19/2024)   Patient History    Smoking Tobacco Use: Never    Smokeless Tobacco Use: Never    Passive Exposure: Not on file  Financial Resource Strain: Not on file  Food Insecurity: Unknown  (06/19/2024)   Epic    Worried About Programme Researcher, Broadcasting/film/video in the Last Year: Never true    [employer] of Food in the Last Year: Patient declined  Transportation Needs: No Transportation Needs (06/19/2024)   Epic    Lack of Transportation (Medical): No    Lack of Transportation (Non-Medical): No  Physical Activity: Not on file  Stress: Not on file  Social Connections: Not on file  Depression (PHQ2-9): Low Risk (12/21/2022)   Depression (PHQ2-9)    PHQ-2 Score: 0  Alcohol Screen: Low Risk (06/19/2024)   Alcohol Screen    Last Alcohol Screening Score (AUDIT): 0  Housing: Low Risk (06/19/2024)   Epic    Unable to Pay for Housing in the Last Year: No    Number of Times Moved in the Last Year: 0    Homeless in the Last Year: No  Utilities: Not At Risk (06/19/2024)   Epic    Threatened with loss of utilities: No  Health Literacy: Not on file   Past Medical History:  Past Medical History:  Diagnosis Date   Chronic lower extremity pain (2ry area of Pain) (Right) 12/21/2022   Chronic pain syndrome 12/20/2022   DDD (degenerative disc disease), 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 Tobie: Pancolonic diverticulosis, tortuous colon, next colonoscopy 2024   TONSILLECTOMY      Current Medications: Current Facility-Administered Medications  Medication Dose Route Frequency Provider Last Rate Last Admin   acetaminophen  (TYLENOL ) tablet 650 mg  650 mg Oral Q6H PRN Onuoha, Chinwendu V, NP   650 mg at 07/02/24 0559   aspirin  EC tablet 81 mg  81 mg Oral Daily Bobbitt, Shalon E, NP   81 mg at 07/08/24 0940   bisacodyl  (DULCOLAX) EC tablet 10 mg  10 mg Oral Daily PRN Jadapalle, Sree, MD       cholecalciferol  (VITAMIN D3) 25 MCG (1000 UNIT) tablet 1,000 Units  1,000 Units Oral Weekly Jadapalle, Sree, MD   1,000 Units at 07/04/24 1201   cloZAPine  (CLOZARIL )  tablet 100 mg  100 mg Oral BID Jadapalle, Sree, MD   100 mg at 07/08/24 9056   Or   OLANZapine  (ZYPREXA ) injection 10 mg  10 mg Intramuscular BID Jadapalle, Sree, MD   10 mg at 07/05/24 2233   diclofenac  Sodium (VOLTAREN ) 1 % topical gel 4 g  4 g Topical QID Donnelly Mellow, MD   4 g at 07/08/24 9060   DULoxetine  (CYMBALTA ) DR capsule 40 mg  40 mg Oral Daily Jadapalle, Sree, MD   40 mg at 07/08/24 0943   [START ON 07/09/2024] famotidine  (PEPCID ) tablet 40 mg  40 mg Oral Daily Salome Hautala, MD       fluticasone  (FLONASE ) 50 MCG/ACT nasal spray 1 spray  1 spray Each Nare Daily Bobbitt, Shalon E, NP       gabapentin  (NEURONTIN ) capsule 300 mg  300 mg Oral TID Bobbitt, Shalon E, NP   300 mg at 07/08/24  1624   hydrochlorothiazide  (HYDRODIURIL ) tablet 25 mg  25 mg Oral Daily Jadapalle, Sree, MD   25 mg at 07/08/24 9057   hydrOXYzine  (ATARAX ) tablet 25 mg  25 mg Oral TID PRN Bobbitt, Shalon E, NP   25 mg at 06/26/24 0731   linagliptin  (TRADJENTA ) tablet 5 mg  5 mg Oral Daily Bobbitt, Shalon E, NP   5 mg at 07/08/24 0941   lisinopril  (ZESTRIL ) tablet 30 mg  30 mg Oral Daily Bobbitt, Shalon E, NP   30 mg at 07/08/24 9057   magnesium  hydroxide (MILK OF MAGNESIA) suspension 30 mL  30 mL Oral Daily PRN Bobbitt, Shalon E, NP       metFORMIN  (GLUCOPHAGE ) tablet 1,000 mg  1,000 mg Oral Q supper Bobbitt, Shalon E, NP   1,000 mg at 07/08/24 1624   multivitamin with minerals tablet 1 tablet  1 tablet Oral Daily Bobbitt, Shalon E, NP   1 tablet at 07/08/24 9060   naproxen  (NAPROSYN ) tablet 375 mg  375 mg Oral TID WC Jadapalle, Sree, MD   375 mg at 07/08/24 1624   OLANZapine  (ZYPREXA ) injection 5 mg  5 mg Intramuscular TID PRN Bobbitt, Shalon E, NP   5 mg at 06/22/24 2259   OLANZapine  zydis (ZYPREXA ) disintegrating tablet 5 mg  5 mg Oral TID PRN Bobbitt, Shalon E, NP   5 mg at 07/02/24 2123   ondansetron  (ZOFRAN ) tablet 4 mg  4 mg Oral Q8H PRN Bobbitt, Shalon E, NP       potassium chloride  SA (KLOR-CON  M) CR  tablet 20 mEq  20 mEq Oral BID Bobbitt, Shalon E, NP   20 mEq at 07/08/24 0941   propranolol  (INDERAL ) tablet 40 mg  40 mg Oral BID Bobbitt, Shalon E, NP   40 mg at 07/08/24 9056   rosuvastatin  (CRESTOR ) tablet 5 mg  5 mg Oral Daily Bobbitt, Shalon E, NP   5 mg at 07/08/24 0941   traZODone  (DESYREL ) tablet 50 mg  50 mg Oral QHS PRN Bobbitt, Shalon E, NP       ziprasidone  (GEODON ) capsule 20 mg  20 mg Oral QHS Jadapalle, Sree, MD   20 mg at 07/07/24 2159   zolpidem  (AMBIEN ) tablet 10 mg  10 mg Oral QHS Jadapalle, Sree, MD   10 mg at 07/07/24 2159    Lab Results:  No results found for this or any previous visit (from the past 48 hours).      Blood Alcohol level:  Lab Results  Component Value Date   [facility] <15 06/18/2024    Metabolic Disorder Labs: Lab Results  Component Value Date   HGBA1C 5.9 (H) 06/28/2024   MPG 122.63 06/28/2024   No results found for: PROLACTIN Lab Results  Component Value Date   CHOL 156 06/28/2024   TRIG 89 06/28/2024   HDL 70 06/28/2024   CHOLHDL 2.2 06/28/2024   VLDL 18 06/28/2024   LDLCALC 69 06/28/2024    Physical Findings: AIMS:  , ,  ,  ,    CIWA:    COWS:      Psychiatric Specialty Exam:  Presentation  General Appearance:  Casual  Eye Contact: Fleeting  Speech: Normal Rate  Speech Volume: Increased    Mood and Affect  Mood: Euphoric  Affect: Labile   Thought Process  Thought Processes: Disorganized  Orientation:Full (Time, Place and Person)  Thought Content:Illogical; Delusions; Tangential  Hallucinations: Denies  Ideas of Reference:Delusions  Suicidal Thoughts: Denies  Homicidal Thoughts: Denies  Sensorium  Memory: Immediate Fair; Remote Fair  Judgment: Impaired  Insight: Shallow   Executive Functions  Concentration: Poor  Attention Span: Poor  Recall: [employer] of Knowledge: Fair  Language: Fair   Psychomotor Activity  Psychomotor Activity: No data  recorded  Musculoskeletal: Strength & Muscle Tone: within normal limits Gait & Station: normal Assets  Assets: Manufacturing Systems Engineer; Desire for Improvement    Physical Exam: Physical Exam Vitals and nursing note reviewed.    ROS Blood pressure (!) 141/76, pulse 92, temperature (!) 97.3 F (36.3 C), resp. rate 17, height 5' 5 (1.651 m), weight 81 kg, SpO2 99%. Body mass index is 29.7 kg/m.  Diagnosis: Active Problems:   Schizoaffective disorder, bipolar type [facility])  Clinical Decision Making: Patient is currently admitted for worsening psychosis, manic symptoms, tangential grandiosity.  She needs inpatient hospitalization for medication management and close monitoring.  Patient is unable to make decisions on her own as she is unable to appreciate and understand her mental health history, the current need for treatment.  [facility] has screened her case and for further evaluation for legal guardianship. [facility] screened in her case. Sister and mom are involved in her care.  Treatment Plan Summary:  Safety and Monitoring:             -- inoluntary admission to inpatient psychiatric unit for safety, stabilization and treatment             -- Daily contact with patient to assess and evaluate symptoms and progress in treatment             -- Patient's case to be discussed in multi-disciplinary team meeting             -- Observation Level: q15 minute checks             -- Vital signs:  q12 hours             -- Precautions: suicide, elopement, and assault   2. Psychiatric Diagnoses and Treatment:  Discontinue Depakote  as patient is noncompliant Added Geodon  20 mg nightly as patient is agreeable to take Geodon  Clozaril  100 mg twice daily enforced with zyprexa  10mg  BID- Cymbalta  40 mg daily   -- The risks/benefits/side-effects/alternatives to this medication were discussed in detail with the patient and time was given for questions. The patient consents to medication trial.                --  Metabolic profile and EKG monitoring obtained while on an atypical antipsychotic (BMI: Lipid Panel: HbgA1c: QTc:)              -- Encouraged patient to participate in unit milieu and in scheduled group therapies                            3. Medical Issues Being Addressed:   4. Discharge Planning:   -- Social work and case management to assist with discharge planning and identification of hospital follow-up needs prior to discharge  -- Estimated LOS: 3-4 days  Desmond Chimera, MD 07/08/2024, 5:59 PM

## 2024-07-09 DIAGNOSIS — F250 Schizoaffective disorder, bipolar type: Secondary | ICD-10-CM | POA: Diagnosis not present

## 2024-07-09 MED ORDER — SODIUM CHLORIDE 0.9 % IV BOLUS: 500.0000 mL | Freq: Once | INTRAVENOUS | Status: DC

## 2024-07-09 MED ORDER — HYDROCHLOROTHIAZIDE 12.5 MG PO TABS
12.5000 mg | ORAL_TABLET | Freq: Once | ORAL | Status: AC
  Administered 2024-07-09: 12.5 mg via ORAL
  Filled 2024-07-09: qty 1

## 2024-07-09 MED ORDER — MIDODRINE HCL 5 MG PO TABS
10.0000 mg | ORAL_TABLET | Freq: Once | ORAL | Status: AC
  Administered 2024-07-09: 10 mg via ORAL
  Filled 2024-07-09: qty 2

## 2024-07-09 MED ORDER — PROPRANOLOL HCL 20 MG PO TABS
20.0000 mg | ORAL_TABLET | Freq: Two times a day (BID) | ORAL | Status: DC
  Administered 2024-07-10: 20 mg via ORAL
  Filled 2024-07-09: qty 1

## 2024-07-09 MED ORDER — LISINOPRIL 20 MG PO TABS
20.0000 mg | ORAL_TABLET | Freq: Every day | ORAL | Status: DC
  Administered 2024-07-10: 20 mg via ORAL

## 2024-07-09 MED ORDER — LISINOPRIL 20 MG PO TABS
20.0000 mg | ORAL_TABLET | Freq: Once | ORAL | Status: AC
  Administered 2024-07-09: 20 mg via ORAL

## 2024-07-09 MED ORDER — HYDROCHLOROTHIAZIDE 25 MG PO TABS
12.5000 mg | ORAL_TABLET | Freq: Every day | ORAL | Status: DC
  Administered 2024-07-10: 12.5 mg via ORAL

## 2024-07-09 NOTE — Progress Notes (Addendum)
 Writer informed of pt low BP 91/43 pulse 106 during shift change vitals. Writer informed provider. Pt assessed. Bilateral edema noted on pt ankles and foot. Pt states she has been going to the bathroom a lot. Swelling worsening. MD made aware. Gatorade given to pt. Provider placing hospitalist consult for further evaluation. Per provider clozapine  can cause similar issues in pt BP and pulse status. Pt denies SOB, dizziness, or lightheadedness.  MD informed writer hospital ist aware to come to unit to assess pt and to hold all BP medication except 1 time dose of [place].   Nanci Lakatos S.,RN

## 2024-07-09 NOTE — Group Note (Signed)
 Date:  07/09/2024 Time:  9:59 PM  Group Topic/Focus:  Wrap-Up Group:   The focus of this group is to help patients review their daily goal of treatment and discuss progress on daily workbooks.    Participation Level:  Active  Participation Quality:  Appropriate  Affect:  Appropriate  Cognitive:  Alert  Insight: Appropriate  Engagement in Group:  Engaged  Modes of Intervention:  Discussion  Additional Comments:    Tommas CHRISTELLA Bunker 07/09/2024, 9:59 PM

## 2024-07-09 NOTE — Group Note (Signed)
 Date:  07/09/2024 Time:  11:05 AM  Group Topic/Focus:  Coping With Mental Health Crisis:   The purpose of this group is to help patients identify strategies for coping with mental health crisis.  Group discusses possible causes of crisis and ways to manage them effectively.    Participation Level:  Did Not Attend    Valerie [name] Bias 07/09/2024, 11:05 AM

## 2024-07-09 NOTE — Progress Notes (Signed)
 D- Patient alert and oriented x 3 . Pt presents pleasantly confused this morning during assessment. Denies SI, HI, AVH, and pain. 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 Pt states she is related to Dr. JINNY. Pt goal states her goal is to be discharged.  I should be discharged by now.  A- Scheduled medications administered to patient, per MD orders. Support and encouragement provided.  Routine safety checks conducted every 15 minutes.  Patient informed to notify staff with problems or concerns.  R- No adverse drug reactions noted. Patient contracts for safety at this time. Patient compliant with medications and treatment plan. Patient receptive, calm, and cooperative. Patient interacts well with others on the unit.  Patient remains safe at this time.    Eston Heslin S.,RN

## 2024-07-09 NOTE — Group Note (Signed)
 Date:  07/09/2024 Time:  4:00 PM  Group Topic/Focus:  Karaoke Group: The purpose of this group is for patients to interact with each other while singing their favorite songs.     Participation Level:  None  Valerie [name] T Zulema 07/09/2024, 4:00 PM

## 2024-07-09 NOTE — Progress Notes (Signed)
 [facility] MD Progress Note  07/09/2024 11:57 AM Anjelique Makar  MRN:  [id number]   Valerie [name] is a 63 year old female presenting voluntary to [facility] requesting a psychiatric evaluation for worsening psychosis. Patient has history of schizophrenia. Patient denied SI, HI, psychosis and alcohol/drug usage. Patient resides at [facility]. Patient reports people are coming by my door talking loud, yelling and screaming and coming in my room with keys. Patient reports having multiple medical Afibs and blood clots due to this. Patient reports people are doing things in the dining room. When asked if she called EMS, patient stated I called EMS, I stripped searched over the phone and called the authorities. Patient is admitted to [facility] unit with Q15 min safety monitoring. Multidisciplinary team approach is offered. Medication management; group/milieu therapy is offered.   Sister Akeelah Seppala : [phone] called on 07/07/24 went to generic voicemail-sister called back and provider discussed at length the current treatment plan of patient being on Clozaril -intermittently refusing, enforced with Zyprexa  IM and recent and addition of Geodon .  Due to patient's ongoing poor response to medications and ongoing delusions discussed the plan of switching Clozaril  to another antipsychotic that comes in LAI form.  Clozaril  may not be the best option for patients who are noncompliant and refusing labs.  Sister is agreeable with the plan  Subjective:  Chart reviewed, case discussed in multidisciplinary meeting, patient seen during rounds.  Staff report patient continues to be labile but slept for 5 to 6 hours.  Patient upon interview continue to be happy, elated, grandiose.  Reports that she is a engineer, civil (consulting), and a retired haematologist.  Patient reports initially that she does not take clozapine  and takes Geodon .  When told that she is on clozapine  she reports that it is not in her MAR.  I told  that it is she said okay that she will take it.  Patient a process was very concrete.  Reports that she had aching sensation in the feet, rated as a five on a scale of one to ten. The patient confirmed hearing and seeing things as induced by others but denied hearing voices that are not present. The patient reported getting five to six hours of sleep per night. The patient mentioned the last bowel movement occurred yesterday and experienced a little drooling but noted that mastication was good.  Past Psychiatric History: see h&P Family History:  Family History  Problem Relation Age of Onset   Colon cancer Neg Hx    Celiac disease Neg Hx    Inflammatory bowel disease Neg Hx    Social History:  Social History   Substance and Sexual Activity  Alcohol Use Never     Social History   Substance and Sexual Activity  Drug Use Never    Social History   Socioeconomic History   Marital status: Single    Spouse name: Not on file   Number of children: Not on file   Years of education: Not on file   Highest education level: Not on file  Occupational History   Not on file  Tobacco Use   Smoking status: Never   Smokeless tobacco: Never  Substance and Sexual Activity   Alcohol use: Never   Drug use: Never   Sexual activity: Not Currently    Birth control/protection: Pill  Other Topics Concern   Not on file  Social History Narrative   Not on file   Social Drivers of Health   Tobacco  Use: Low Risk (06/19/2024)   Patient History    Smoking Tobacco Use: Never    Smokeless Tobacco Use: Never    Passive Exposure: Not on file  Financial Resource Strain: Not on file  Food Insecurity: Unknown (06/19/2024)   Epic    Worried About Programme Researcher, Broadcasting/film/video in the Last Year: Never true    [employer] of Food in the Last Year: Patient declined  Transportation Needs: No Transportation Needs (06/19/2024)   Epic    Lack of Transportation (Medical): No    Lack of Transportation (Non-Medical): No   Physical Activity: Not on file  Stress: Not on file  Social Connections: Not on file  Depression (PHQ2-9): Low Risk (12/21/2022)   Depression (PHQ2-9)    PHQ-2 Score: 0  Alcohol Screen: Low Risk (06/19/2024)   Alcohol Screen    Last Alcohol Screening Score (AUDIT): 0  Housing: Low Risk (06/19/2024)   Epic    Unable to Pay for Housing in the Last Year: No    Number of Times Moved in the Last Year: 0    Homeless in the Last Year: No  Utilities: Not At Risk (06/19/2024)   Epic    Threatened with loss of utilities: No  Health Literacy: Not on file   Past Medical History:  Past Medical History:  Diagnosis Date   Chronic lower extremity pain (2ry area of Pain) (Right) 12/21/2022   Chronic pain syndrome 12/20/2022   DDD (degenerative disc disease), 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 Tobie: Pancolonic diverticulosis, tortuous colon, next colonoscopy 2024   TONSILLECTOMY      Current Medications: Current Facility-Administered Medications  Medication Dose Route Frequency Provider Last Rate Last Admin   acetaminophen  (TYLENOL ) tablet 650 mg  650 mg Oral Q6H PRN Onuoha, Chinwendu V, NP   650 mg at 07/02/24 0559   aspirin  EC tablet 81 mg  81 mg Oral Daily Bobbitt, Shalon E, NP   81 mg at 07/09/24 9097   bisacodyl  (DULCOLAX) EC tablet 10 mg  10 mg Oral Daily PRN Jadapalle, Sree, MD       cholecalciferol  (VITAMIN D3) 25 MCG (1000 UNIT) tablet 1,000 Units  1,000 Units Oral Weekly Jadapalle, Sree, MD   1,000 Units at 07/04/24 1201   cloZAPine  (CLOZARIL ) tablet 100 mg  100 mg Oral BID Jadapalle, Sree, MD   100 mg at 07/09/24 0900   Or   OLANZapine  (ZYPREXA ) injection 10 mg  10 mg Intramuscular BID Jadapalle, Sree, MD   10 mg at 07/05/24 2233   diclofenac  Sodium (VOLTAREN ) 1 % topical gel 4 g  4 g Topical QID Donnelly Mellow, MD   4 g at  07/08/24 2225   DULoxetine  (CYMBALTA ) DR capsule 40 mg  40 mg Oral Daily Jadapalle, Sree, MD   40 mg at 07/09/24 0900   famotidine  (PEPCID ) tablet 40 mg  40 mg Oral Daily Thresa Dozier, MD   40 mg at 07/09/24 9095   fluticasone  (FLONASE ) 50 MCG/ACT nasal spray 1 spray  1 spray Each Nare Daily Bobbitt, Shalon E, NP       gabapentin  (NEURONTIN ) capsule 300 mg  300 mg Oral TID Bobbitt, Shalon E, NP   300 mg at 07/09/24 9096   hydrochlorothiazide  (HYDRODIURIL ) tablet 25 mg  25 mg Oral Daily Jadapalle, Sree, MD  25 mg at 07/08/24 9057   hydrOXYzine  (ATARAX ) tablet 25 mg  25 mg Oral TID PRN Bobbitt, Shalon E, NP   25 mg at 06/26/24 0731   linagliptin  (TRADJENTA ) tablet 5 mg  5 mg Oral Daily Bobbitt, Shalon E, NP   5 mg at 07/09/24 0859   lisinopril  (ZESTRIL ) tablet 30 mg  30 mg Oral Daily Bobbitt, Shalon E, NP   30 mg at 07/08/24 9057   magnesium  hydroxide (MILK OF MAGNESIA) suspension 30 mL  30 mL Oral Daily PRN Bobbitt, Shalon E, NP       metFORMIN  (GLUCOPHAGE ) tablet 1,000 mg  1,000 mg Oral Q supper Bobbitt, Shalon E, NP   1,000 mg at 07/08/24 1624   multivitamin with minerals tablet 1 tablet  1 tablet Oral Daily Bobbitt, Shalon E, NP   1 tablet at 07/09/24 9096   naproxen  (NAPROSYN ) tablet 375 mg  375 mg Oral TID WC Jadapalle, Sree, MD   375 mg at 07/09/24 0900   OLANZapine  (ZYPREXA ) injection 5 mg  5 mg Intramuscular TID PRN Bobbitt, Shalon E, NP   5 mg at 06/22/24 2259   OLANZapine  zydis (ZYPREXA ) disintegrating tablet 5 mg  5 mg Oral TID PRN Bobbitt, Shalon E, NP   5 mg at 07/02/24 2123   ondansetron  (ZOFRAN ) tablet 4 mg  4 mg Oral Q8H PRN Bobbitt, Shalon E, NP       potassium chloride  SA (KLOR-CON  M) CR tablet 20 mEq  20 mEq Oral BID Bobbitt, Shalon E, NP   20 mEq at 07/09/24 0900   propranolol  (INDERAL ) tablet 40 mg  40 mg Oral BID Bobbitt, Shalon E, NP   40 mg at 07/08/24 0943   rosuvastatin  (CRESTOR ) tablet 5 mg  5 mg Oral Daily Bobbitt, Shalon E, NP   5 mg at 07/09/24 9096    traZODone  (DESYREL ) tablet 50 mg  50 mg Oral QHS PRN Bobbitt, Shalon E, NP       ziprasidone  (GEODON ) capsule 20 mg  20 mg Oral QHS Jadapalle, Sree, MD   20 mg at 07/08/24 2214   zolpidem  (AMBIEN ) tablet 10 mg  10 mg Oral QHS Jadapalle, Sree, MD   10 mg at 07/08/24 2214    Lab Results:  No results found for this or any previous visit (from the past 48 hours).      Blood Alcohol level:  Lab Results  Component Value Date   [facility] <15 06/18/2024    Metabolic Disorder Labs: Lab Results  Component Value Date   HGBA1C 5.9 (H) 06/28/2024   MPG 122.63 06/28/2024   No results found for: PROLACTIN Lab Results  Component Value Date   CHOL 156 06/28/2024   TRIG 89 06/28/2024   HDL 70 06/28/2024   CHOLHDL 2.2 06/28/2024   VLDL 18 06/28/2024   LDLCALC 69 06/28/2024    Physical Findings: AIMS:  , ,  ,  ,    CIWA:    COWS:      Psychiatric Specialty Exam:  Presentation  General Appearance:  Casual  Eye Contact: Fleeting  Speech: Normal Rate  Speech Volume: Increased    Mood and Affect  Mood: Euphoric  Affect: Labile   Thought Process  Thought Processes: Disorganized  Orientation:Full (Time, Place and Person)  Thought Content:Illogical; Delusions; Tangential  Hallucinations: Denies  Ideas of Reference:Delusions  Suicidal Thoughts: Denies  Homicidal Thoughts: Denies   Sensorium  Memory: Immediate Fair; Remote Fair  Judgment: Impaired  Insight: Community Education Officer  Concentration: Poor  Attention Span: Poor  Recall: [employer] of Knowledge: Fair  Language: Fair   Psychomotor Activity  Psychomotor Activity: No data recorded  Musculoskeletal: Strength & Muscle Tone: within normal limits Gait & Station: normal Assets  Assets: Manufacturing Systems Engineer; Desire for Improvement    Physical Exam: Physical Exam Vitals and nursing note reviewed.    ROS Blood pressure 123/68, pulse (!) 102, temperature 98.7 F  (37.1 C), resp. rate 18, height 5' 5 (1.651 m), weight 81 kg, SpO2 100%. Body mass index is 29.7 kg/m.  Diagnosis: Active Problems:   Schizoaffective disorder, bipolar type [facility])  Clinical Decision Making: Patient is currently admitted for worsening psychosis, manic symptoms, tangential grandiosity.  She needs inpatient hospitalization for medication management and close monitoring.  Patient is unable to make decisions on her own as she is unable to appreciate and understand her mental health history, the current need for treatment.  [facility] has screened her case and for further evaluation for legal guardianship. [facility] screened in her case. Sister and mom are involved in her care.  Treatment Plan Summary:  Safety and Monitoring:             -- inoluntary admission to inpatient psychiatric unit for safety, stabilization and treatment             -- Daily contact with patient to assess and evaluate symptoms and progress in treatment             -- Patient's case to be discussed in multi-disciplinary team meeting             -- Observation Level: q15 minute checks             -- Vital signs:  q12 hours             -- Precautions: suicide, elopement, and assault   2. Psychiatric Diagnoses and Treatment:  Discontinue Depakote  as patient is noncompliant ContGeodon 20 mg nightly as patient is agreeable to take Geodon  Clozaril  100 mg twice daily enforced with zyprexa  10mg  BID- Cymbalta  40 mg daily   -- The risks/benefits/side-effects/alternatives to this medication were discussed in detail with the patient and time was given for questions. The patient consents to medication trial.                -- Metabolic profile and EKG monitoring obtained while on an atypical antipsychotic (BMI: Lipid Panel: HbgA1c: QTc:)              -- Encouraged patient to participate in unit milieu and in scheduled group therapies                            3. Medical Issues Being Addressed:   HTN - BP fluctuating - Blood  pressure readings show significant variability, with multiple episodes of symptomatic-range hypotension (SBP 80s-90s, DBP 40s-50s) alternating with hypertensive readings earlier in the admission. Monitor for dizziness, falls, confusion, or syncope.  Decrease lisinopril  20 mg daily  Decrease hydrochlorothiazide  to 12.5 daily  Decrease Inderal  40 mg bid to 20 mg bid - ?? For BP or Tachycardia ?? On clozapine   Hospitalist consulted   4. Discharge Planning:   -- Social work and case management to assist with discharge planning and identification of hospital follow-up needs prior to discharge  -- Estimated LOS: 3-4 days  Desmond Chimera, MD 07/09/2024, 11:57 AM

## 2024-07-09 NOTE — Plan of Care (Signed)
  Problem: Activity: Goal: Interest or engagement in activities will improve Outcome: Progressing Goal: Sleeping patterns will improve Outcome: Progressing   Problem: Education: Goal: Emotional status will improve Outcome: Not Progressing Goal: Mental status will improve Outcome: Not Progressing Goal: Verbalization of understanding the information provided will improve Outcome: Not Progressing

## 2024-07-09 NOTE — Plan of Care (Signed)
" °  Problem: Education: Goal: Ability to state activities that reduce stress will improve Outcome: Progressing   Problem: Self-Concept: Goal: Ability to identify factors that promote anxiety will improve Outcome: Progressing Goal: Level of anxiety will decrease Outcome: Progressing Goal: Ability to modify response to factors that promote anxiety will improve Outcome: Progressing   Problem: Activity: Goal: Will verbalize the importance of balancing activity with adequate rest periods Outcome: Progressing   "

## 2024-07-09 NOTE — Progress Notes (Addendum)
 SI/HI: denies Behavior/Mood: pt remains irritable and easily agitation. Delusional and grandiose. Denies anxiety and depression. Interaction/Group: visible in milieu playing cards and puzzles, independently. Partial participation in group.  Medications/PRNs:all po medications given as scheduled except Propranolol  dt low B/P. No PRNs given. Pain: denies Other: [place], NP notified b/p 92/45, HR 91. NNO. Pt asymptomatic. Slept 5.25 hours.    07/08/24 2100  Psych Admission Type (Psych Patients Only)  Admission Status Voluntary  Psychosocial Assessment  Patient Complaints Suspiciousness  Eye Contact Suspiciousness  Facial Expression Animated  Affect Inconsistent with thought content  Speech Slow  Interaction Demanding  Motor Activity Slow  Appearance/Hygiene Unremarkable  Behavior Characteristics Cooperative  Mood Preoccupied  Thought Process  Coherency Tangential  Content Preoccupation  Delusions Grandeur  Perception Hallucinations  Hallucination Auditory  Judgment Impaired  Confusion Mild  Danger to Self  Current suicidal ideation? Denies

## 2024-07-10 DIAGNOSIS — E7800 Pure hypercholesterolemia, unspecified: Secondary | ICD-10-CM

## 2024-07-10 DIAGNOSIS — F250 Schizoaffective disorder, bipolar type: Secondary | ICD-10-CM | POA: Diagnosis not present

## 2024-07-10 DIAGNOSIS — I10 Essential (primary) hypertension: Secondary | ICD-10-CM

## 2024-07-10 DIAGNOSIS — R600 Localized edema: Secondary | ICD-10-CM

## 2024-07-10 DIAGNOSIS — R000 Tachycardia, unspecified: Secondary | ICD-10-CM | POA: Diagnosis not present

## 2024-07-10 DIAGNOSIS — R079 Chest pain, unspecified: Secondary | ICD-10-CM

## 2024-07-10 LAB — TROPONIN T, HIGH SENSITIVITY
Troponin T High Sensitivity: <15 ng/L (ref 0–19)
Troponin T High Sensitivity: <15 ng/L (ref 0–19)

## 2024-07-10 LAB — CBC
HCT: 35.1 % — ABNORMAL LOW (ref 36.0–46.0)
Hemoglobin: 11.1 g/dL — ABNORMAL LOW (ref 12.0–15.0)
MCH: 27.4 pg (ref 26.0–34.0)
MCHC: 31.6 g/dL (ref 30.0–36.0)
MCV: 86.7 fL (ref 80.0–100.0)
Platelets: 340 K/uL (ref 150–400)
RBC: 4.05 MIL/uL (ref 3.87–5.11)
RDW: 13.4 % (ref 11.5–15.5)
WBC: 7.7 K/uL (ref 4.0–10.5)
nRBC: 0 % (ref 0.0–0.2)

## 2024-07-10 LAB — COMPREHENSIVE METABOLIC PANEL WITH GFR
ALT: 12 U/L (ref 0–44)
AST: 16 U/L (ref 15–41)
Albumin: 3.7 g/dL (ref 3.5–5.0)
Alkaline Phosphatase: 111 U/L (ref 38–126)
Anion gap: 9 (ref 5–15)
BUN: 10 mg/dL (ref 8–23)
CO2: 27 mmol/L (ref 22–32)
Calcium: 9.5 mg/dL (ref 8.9–10.3)
Chloride: 101 mmol/L (ref 98–111)
Creatinine, Ser: 0.63 mg/dL (ref 0.44–1.00)
GFR, Estimated: >60 mL/min
Glucose, Bld: 102 mg/dL — ABNORMAL HIGH (ref 70–99)
Potassium: 4.4 mmol/L (ref 3.5–5.1)
Sodium: 136 mmol/L (ref 135–145)
Total Bilirubin: <0.2 mg/dL (ref 0.0–1.2)
Total Protein: 6.6 g/dL (ref 6.5–8.1)

## 2024-07-10 LAB — TSH: TSH: 0.391 u[IU]/mL (ref 0.350–4.500)

## 2024-07-10 MED ORDER — FUROSEMIDE 20 MG PO TABS
40.0000 mg | ORAL_TABLET | Freq: Every day | ORAL | Status: DC
  Administered 2024-07-10 – 2024-07-11 (×2): 40 mg via ORAL
  Filled 2024-07-10 (×3): qty 2

## 2024-07-10 MED ORDER — PROPRANOLOL HCL 40 MG PO TABS
40.0000 mg | ORAL_TABLET | Freq: Two times a day (BID) | ORAL | Status: DC
  Administered 2024-07-12: 40 mg via ORAL
  Filled 2024-07-10 (×4): qty 1

## 2024-07-10 NOTE — Group Note (Signed)
 Date:  07/10/2024 Time:  10:44 AM  Group Topic/Focus:    For geriatric mental health patients, effective exercises combine gentle movement, mind-body practices, and social engagement, focusing on activities like walking in nature, yoga, Tai Chi, water aerobics, and light strength training, which boost mood, reduce anxiety, improve sleep, and fight cognitive decline by increasing blood flow and endorphins, while also providing structure and purpose. Small amounts of daily activity (even 10 minutes) help, with a mix of aerobic, balance, and strength exercises recommended for overall well-being.    Participation Level:  Active  Participation Quality:  Appropriate  Affect:  Appropriate  Cognitive:  Appropriate  Insight: Appropriate  Engagement in Group:  Engaged  Modes of Intervention:  Activity  Additional Comments:  N/A  Valerie [name] 07/10/2024, 10:44 AM

## 2024-07-10 NOTE — Assessment & Plan Note (Addendum)
 Patient with some variation in her blood pressure from borderline soft to high. Takes HCTZ 25 mg daily, lisinopril  30 mg daily and Inderal  40 mg twice daily at home. Psych decreased the dose of lisinopril  to 20 mg daily and Inderal  to 20 mg twice daily as her blood pressure was borderline soft yesterday. 12/30.  Blood pressure within goal today - Continue home dose of Inderal  at 40 mg twice daily due to borderline sinus tachycardia which seems chronic. - Continuing lisinopril  at 20 mg daily -Discontinuing HCTZ and replacing it with Lasix  40 mg daily -Continue to monitor - Monitor BMP every other day

## 2024-07-10 NOTE — Assessment & Plan Note (Signed)
 Per patient worsening bilateral lower extremity edema.  No shortness of breath or orthopnea.  No PND. No prior echocardiogram or history of heart failure. - Ordered echocardiogram-pending results - Started on p.o. Lasix  40 mg daily - Patient is refusing compression stockings - Encourage raising legs while sitting.

## 2024-07-10 NOTE — Assessment & Plan Note (Signed)
 Being managed by primary team.

## 2024-07-10 NOTE — BH IP Treatment Plan (Signed)
 Interdisciplinary Treatment and Diagnostic Plan Update  07/10/2024 Time of Session: 8:00 AM Valerie [name] MRN: [id number]  Principal Diagnosis: Schizophrenia [facility])  Secondary Diagnoses: Active Problems:   HTN (hypertension)   Hypercholesteremia   Schizoaffective disorder, bipolar type (HCC)   Bilateral lower extremity edema   Chest pain   Sinus tachycardia   Current Medications:  Current Facility-Administered Medications  Medication Dose Route Frequency Provider Last Rate Last Admin   acetaminophen  (TYLENOL ) tablet 650 mg  650 mg Oral Q6H PRN Onuoha, Chinwendu V, NP   650 mg at 07/02/24 0559   aspirin  EC tablet 81 mg  81 mg Oral Daily Bobbitt, Shalon E, NP   81 mg at 07/10/24 1118   bisacodyl  (DULCOLAX) EC tablet 10 mg  10 mg Oral Daily PRN Jadapalle, Sree, MD       cholecalciferol  (VITAMIN D3) 25 MCG (1000 UNIT) tablet 1,000 Units  1,000 Units Oral Weekly Jadapalle, Sree, MD   1,000 Units at 07/04/24 1201   cloZAPine  (CLOZARIL ) tablet 100 mg  100 mg Oral BID Jadapalle, Sree, MD   100 mg at 07/10/24 1118   Or   OLANZapine  (ZYPREXA ) injection 10 mg  10 mg Intramuscular BID Jadapalle, Sree, MD   10 mg at 07/05/24 2233   diclofenac  Sodium (VOLTAREN ) 1 % topical gel 4 g  4 g Topical QID Jadapalle, Sree, MD   4 g at 07/08/24 2225   DULoxetine  (CYMBALTA ) DR capsule 40 mg  40 mg Oral Daily Jadapalle, Sree, MD   40 mg at 07/10/24 1116   famotidine  (PEPCID ) tablet 40 mg  40 mg Oral Daily Shrivastava, Aryendra, MD   40 mg at 07/10/24 1117   fluticasone  (FLONASE ) 50 MCG/ACT nasal spray 1 spray  1 spray Each Nare Daily Bobbitt, Shalon E, NP       furosemide  (LASIX ) tablet 40 mg  40 mg Oral Daily Amin, Sumayya, MD   40 mg at 07/10/24 1409   gabapentin  (NEURONTIN ) capsule 300 mg  300 mg Oral TID Bobbitt, Shalon E, NP   300 mg at 07/10/24 1116   hydrOXYzine  (ATARAX ) tablet 25 mg  25 mg Oral TID PRN Bobbitt, Shalon E, NP   25 mg at 06/26/24 9268   linagliptin  (TRADJENTA ) tablet 5 mg  5 mg Oral  Daily Bobbitt, Shalon E, NP   5 mg at 07/10/24 1117   lisinopril  (ZESTRIL ) tablet 20 mg  20 mg Oral Daily Shrivastava, Aryendra, MD   20 mg at 07/10/24 1117   magnesium  hydroxide (MILK OF MAGNESIA) suspension 30 mL  30 mL Oral Daily PRN Bobbitt, Shalon E, NP       metFORMIN  (GLUCOPHAGE ) tablet 1,000 mg  1,000 mg Oral Q supper Bobbitt, Shalon E, NP   500 mg at 07/09/24 1628   multivitamin with minerals tablet 1 tablet  1 tablet Oral Daily Bobbitt, Shalon E, NP   1 tablet at 07/10/24 1117   naproxen  (NAPROSYN ) tablet 375 mg  375 mg Oral TID WC Jadapalle, Sree, MD   375 mg at 07/10/24 1409   OLANZapine  (ZYPREXA ) injection 5 mg  5 mg Intramuscular TID PRN Bobbitt, Shalon E, NP   5 mg at 06/22/24 2259   OLANZapine  zydis (ZYPREXA ) disintegrating tablet 5 mg  5 mg Oral TID PRN Bobbitt, Shalon E, NP   5 mg at 07/02/24 2123   ondansetron  (ZOFRAN ) tablet 4 mg  4 mg Oral Q8H PRN Bobbitt, Shalon E, NP       potassium chloride  SA (KLOR-CON  M) CR tablet  20 mEq  20 mEq Oral BID Bobbitt, Shalon E, NP   20 mEq at 07/10/24 1117   propranolol  (INDERAL ) tablet 40 mg  40 mg Oral BID Amin, Sumayya, MD       rosuvastatin  (CRESTOR ) tablet 5 mg  5 mg Oral Daily Bobbitt, Shalon E, NP   5 mg at 07/10/24 1117   sodium chloride  0.9 % bolus 500 mL  500 mL Intravenous Once Shrivastava, Aryendra, MD       traZODone  (DESYREL ) tablet 50 mg  50 mg Oral QHS PRN Bobbitt, Shalon E, NP       ziprasidone  (GEODON ) capsule 20 mg  20 mg Oral QHS Jadapalle, Sree, MD   20 mg at 07/09/24 2224   zolpidem  (AMBIEN ) tablet 10 mg  10 mg Oral QHS Jadapalle, Sree, MD   10 mg at 07/09/24 2221   PTA Medications: Medications Prior to Admission  Medication Sig Dispense Refill Last Dose/Taking   acetaminophen  (TYLENOL ) 650 MG CR tablet Take 650 mg by mouth every 8 (eight) hours as needed for pain.      benztropine (COGENTIN) 0.5 MG tablet Take by mouth.      bisacodyl  5 MG EC tablet As directed for procedure 8 tablet 0    Cholecalciferol  (VITAMIN D )  125 MCG (5000 UT) CAPS Take 1,000 Units by mouth once a week.      cloZAPine  (CLOZARIL ) 100 MG tablet Take 200 mg by mouth 2 (two) times daily.      DULoxetine  (CYMBALTA ) 20 MG capsule Take 20 mg by mouth daily.      fluticasone  (FLONASE ) 50 MCG/ACT nasal spray Place 1 spray into both nostrils daily.      gabapentin  (NEURONTIN ) 300 MG capsule Take 300 mg by mouth 3 (three) times daily.      haloperidol  (HALDOL ) 0.5 MG tablet Take 0.5 mg by mouth 2 (two) times daily.      hydrochlorothiazide  (HYDRODIURIL ) 25 MG tablet Take 25 mg by mouth daily.      lisinopril  (ZESTRIL ) 30 MG tablet Take 30 mg by mouth daily.      loperamide (IMODIUM) 2 MG capsule Take 2 mg by mouth as needed for diarrhea or loose stools. Take 1 capsule by mouth as needed with each loose stool/diarrhea nte 8 doses in 24 hr      Multiple Vitamin (DAILY VITE PO) Take 1 tablet by mouth daily.      naproxen  (NAPROSYN ) 500 MG tablet Take 500 mg by mouth every 12 (twelve) hours as needed.      omeprazole (PRILOSEC) 20 MG capsule Take 20 mg by mouth daily.      ondansetron  (ZOFRAN ) 4 MG tablet Take 4 mg by mouth every 8 (eight) hours as needed for nausea or vomiting.      potassium chloride  SA (KLOR-CON  M) 20 MEQ tablet Take 20 mEq by mouth 2 (two) times daily.      propranolol  (INDERAL ) 40 MG tablet Take 40 mg by mouth 2 (two) times daily.      rosuvastatin  (CRESTOR ) 5 MG tablet Take 5 mg by mouth daily.      sodium phosphate  (FLEET) ENEM As directed for procedure 133 mL 0    triamcinolone ointment (KENALOG) 0.1 % Apply topically.       Patient Stressors: Medication change or noncompliance    Patient Strengths: Ability for insight   Treatment Modalities: Medication Management, Group therapy, Case management,  1 to 1 session with clinician, Psychoeducation, Recreational therapy.   Physician Treatment Plan  for Primary Diagnosis: Schizophrenia (HCC) Long Term Goal(s): Improvement in symptoms so as ready for discharge   Short  Term Goals: Ability to identify changes in lifestyle to reduce recurrence of condition will improve Ability to verbalize feelings will improve Ability to disclose and discuss suicidal ideas Ability to demonstrate self-control will improve Ability to identify and develop effective coping behaviors will improve  Medication Management: Evaluate patient's response, side effects, and tolerance of medication regimen.  Therapeutic Interventions: 1 to 1 sessions, Unit Group sessions and Medication administration.  Evaluation of Outcomes: Not Progressing  Physician Treatment Plan for Secondary Diagnosis: Active Problems:   HTN (hypertension)   Hypercholesteremia   Schizoaffective disorder, bipolar type (HCC)   Bilateral lower extremity edema   Chest pain   Sinus tachycardia  Long Term Goal(s): Improvement in symptoms so as ready for discharge   Short Term Goals: Ability to identify changes in lifestyle to reduce recurrence of condition will improve Ability to verbalize feelings will improve Ability to disclose and discuss suicidal ideas Ability to demonstrate self-control will improve Ability to identify and develop effective coping behaviors will improve     Medication Management: Evaluate patient's response, side effects, and tolerance of medication regimen.  Therapeutic Interventions: 1 to 1 sessions, Unit Group sessions and Medication administration.  Evaluation of Outcomes: Not Progressing   RN Treatment Plan for Primary Diagnosis: Schizophrenia (HCC) Long Term Goal(s): Knowledge of disease and therapeutic regimen to maintain health will improve  Short Term Goals: Ability to remain free from injury will improve, Ability to verbalize frustration and anger appropriately will improve, Ability to demonstrate self-control, Ability to participate in decision making will improve, Ability to verbalize feelings will improve, Ability to disclose and discuss suicidal ideas, Ability to identify  and develop effective coping behaviors will improve, and Compliance with prescribed medications will improve  Medication Management: RN will administer medications as ordered by provider, will assess and evaluate patient's response and provide education to patient for prescribed medication. RN will report any adverse and/or side effects to prescribing provider.  Therapeutic Interventions: 1 on 1 counseling sessions, Psychoeducation, Medication administration, Evaluate responses to treatment, Monitor vital signs and CBGs as ordered, Perform/monitor CIWA, COWS, AIMS and Fall Risk screenings as ordered, Perform wound care treatments as ordered.  Evaluation of Outcomes: Not Progressing   LCSW Treatment Plan for Primary Diagnosis: Schizophrenia (HCC) Long Term Goal(s): Safe transition to appropriate next level of care at discharge, Engage patient in therapeutic group addressing interpersonal concerns.  Short Term Goals: Engage patient in aftercare planning with referrals and resources, Increase social support, Increase ability to appropriately verbalize feelings, Increase emotional regulation, Facilitate acceptance of mental health diagnosis and concerns, Facilitate patient progression through stages of change regarding substance use diagnoses and concerns, Identify triggers associated with mental health/substance abuse issues, and Increase skills for wellness and recovery  Therapeutic Interventions: Assess for all discharge needs, 1 to 1 time with Social worker, Explore available resources and support systems, Assess for adequacy in community support network, Educate family and significant other(s) on suicide prevention, Complete Psychosocial Assessment, Interpersonal group therapy.  Evaluation of Outcomes: Not Progressing   Progress in Treatment: Attending groups: Yes. and No. Participating in groups: Yes. and No. Taking medication as prescribed: Yes. Toleration medication:  Yes. Family/Significant other contact made: Yes, pt's sister and mother  Patient understands diagnosis: No. Discussing patient identified problems/goals with staff: Yes.  Medical problems stabilized or resolved: Yes.  Denies suicidal/homicidal ideation: Yes.  Issues/concerns per patient self-inventory: No.  Other: None    New problem(s) identified: No, Describe:  None  identified 06/29/24 Update: No, Describe:  None  Update 07/04/24: No changes at this time Update 07/10/24: No changes at this time     New Short Term/Long Term Goal(s): elimination of symptoms of psychosis, medication management for mood stabilization; elimination of SI thoughts; development of comprehensive mental wellness plan. 06/29/24 Update: No changes at this time. Update 07/04/24: No changes at this time Update 07/10/24: No changes at this time Update 07/10/24: No changes at this time     Patient Goals:   To continue to get well 06/29/24 Update: No changes at this time. Update 07/04/24: No changes at this time Update 07/10/24: No changes at this time Update 07/10/24: No changes at this time     Discharge Plan or Barriers: CSW will assist with appropriate discharge planning 06/29/24 Update:  CSW has made APS report to [facility]Ps following safety concerns. CSW to continue to coordinate with DSS worker to continue to assess. Update 07/04/24: No changes at this time Update 07/10/24: No changes at this time Update 07/10/24: DSS reports no changes at this time, DSS unsure if pt's family has filed for the guardianship. Caseworker Adams Louder reports she will check in with CSW after New Years      Reason for Continuation of Hospitalization: Delusions  Mania Medication stabilization    Estimated Length of Stay:1 to 7 days 06/29/24 Update: TBD. Update 07/04/24: TBD Update 07/10/24: TBD Update 07/10/24: TBD    Last 3 [place] Suicide Severity Risk Score: Flowsheet Row Admission (Current) from 06/19/2024 in [facility] ED from 06/18/2024 in [facility] Emergency Department at [facility] ED from 05/24/2022 in [facility] Department at [facility]  C-SSRS RISK CATEGORY No Risk No Risk No Risk    Last PHQ 2/9 Scores:    12/21/2022    9:00 AM  Depression screen PHQ 2/9  Decreased Interest 0  Down, Depressed, Hopeless 0  PHQ - 2 Score 0    Scribe for Treatment Team: Lum JONETTA Raynaldo ISRAEL 07/10/2024 3:38 PM

## 2024-07-10 NOTE — Group Note (Signed)
 Recreation Therapy Group Note   Group Topic:Coping Skills  Group Date: 07/10/2024 Start Time: 1415 End Time: 1440 Facilitators: Celestia Jeoffrey BRAVO, LRT, CTRS Location: Courtyard  Group Description: Music. Patients are encouraged to name their favorite song(s) for LRT to play song through speaker for group to hear, while in the courtyard getting fresh air and sunlight. Patients educated on the definition of leisure and the importance of having different leisure interests outside of the hospital. Group discussed how leisure activities can often be used as pharmacologist and that listening to music and being outside are examples.    Goal Area(s) Addressed:  Patient will identify a current leisure interest.  Patient will practice making a positive decision. Patient will have the opportunity to try a new leisure activity.   Affect/Mood: N/A   Participation Level: Did not attend    Clinical Observations/Individualized Feedback: Patient did not attend.  Plan: Continue to engage patient in RT group sessions 2-3x/week.   Jeoffrey BRAVO Celestia, LRT, CTRS 07/10/2024 4:45 PM

## 2024-07-10 NOTE — Consult Note (Signed)
 Initial Consultation Note   Patient: Valerie [name] FMW:[id number] DOB: [birth year] PCP: System, Provider Not In DOA: 06/19/2024 DOS: the patient was seen and examined on 07/10/2024 Primary service: Donnelly Mellow, MD  Referring physician: Hellen Char, MD Reason for consult: Intermittent chest pain, fluctuating blood pressure and lower extremity edema  Assessment and Plan: * Schizophrenia (HCC)-resolved as of 06/20/2024 Being managed by primary team.  Chest pain Very nonspecific intermittent chest pain which seems more like musculoskeletal.  EKG was negative for any significant abnormality. - Troponin and echocardiogram ordered  Sinus tachycardia Heart rate mostly in low 100.  Seems like chronic sinus tachycardia.  Patient takes Inderal  40 mg twice daily which was decreased to 20 mg twice daily recently. - Increasing Inderal  to 40 mg twice daily -Check TSH  HTN (hypertension) Patient with some variation in her blood pressure from borderline soft to high. Takes HCTZ 25 mg daily, lisinopril  30 mg daily and Inderal  40 mg twice daily at home. Psych decreased the dose of lisinopril  to 20 mg daily and Inderal  to 20 mg twice daily as her blood pressure was borderline soft yesterday. - Increasing Inderal  back to home dose of 40 mg twice daily due to borderline sinus tachycardia which seems chronic. - Continuing lisinopril  at 20 mg daily -Discontinuing HCTZ and replacing it with Lasix  40 mg daily -Continue to monitor -Checking basic labs which include CBC and CMP  Bilateral lower extremity edema Per patient worsening bilateral lower extremity edema.  No shortness of breath or orthopnea.  No PND. No prior echocardiogram or history of heart failure. - Ordered echocardiogram - Started on p.o. Lasix  40 mg daily - Patient is refusing compression stockings - Encourage raising legs while sitting.   Hypercholesteremia - Continuing home Crestor   TRH will continue to follow the  patient.  HPI: Valerie [name] is a 63 y.o. female with past medical history of schizophrenia and bipolar disorder who was admitted at geropsych facility due to manic episode.  She was complaining of some intermittent chest pain, mostly involving upper retrosternal area, intermittent, no relationship with activity and no radiation.  No associated factors.  Sometimes pain increased with moving around her arms, worsening bilateral lower extremity edema, denies any shortness of breath orthopnea or PND.  Patient denies any upper respiratory symptoms.  No urinary symptoms.  No other pain, recent illnesses.  Behavior consistent with her current psych diagnosis.  Quite suspicious.  Psych was also concerned about her fluctuating blood pressure going from borderline soft to little high.  Psych was making some changes to her home medications.  Review of Systems: As mentioned in the history of present illness. All other systems reviewed and are negative. Past Medical History:  Diagnosis Date   Chronic lower extremity pain (2ry area of Pain) (Right) 12/21/2022   Chronic pain syndrome 12/20/2022   DDD (degenerative disc disease), lumbosacral 12/21/2022   HTN (hypertension)    Hypercholesteremia    Prediabetes    Schizophrenia (HCC)    Past Surgical History:  Procedure Laterality Date   COLONOSCOPY  2014   Dr. Tobie: Pancolonic diverticulosis, tortuous colon, next colonoscopy 2024   TONSILLECTOMY     Social History:  reports that she has never smoked. She has never used smokeless tobacco. She reports that she does not drink alcohol and does not use drugs.  Allergies[1]  Family History  Problem Relation Age of Onset   Colon cancer Neg Hx    Celiac disease Neg Hx    Inflammatory bowel disease Neg  Hx     Prior to Admission medications  Medication Sig Start Date End Date Taking? Authorizing Provider  acetaminophen  (TYLENOL ) 650 MG CR tablet Take 650 mg by mouth every 8 (eight) hours as needed  for pain.    [provider]  benztropine (COGENTIN) 0.5 MG tablet Take by mouth. 08/03/22   [provider]  bisacodyl  5 MG EC tablet As directed for procedure 04/29/23   Rourk, Lamar HERO, MD  Cholecalciferol  (VITAMIN D ) 125 MCG (5000 UT) CAPS Take 1,000 Units by mouth once a week.    [provider]  cloZAPine  (CLOZARIL ) 100 MG tablet Take 200 mg by mouth 2 (two) times daily.    [provider]  DULoxetine  (CYMBALTA ) 20 MG capsule Take 20 mg by mouth daily. 03/15/23   [provider]  fluticasone  (FLONASE ) 50 MCG/ACT nasal spray Place 1 spray into both nostrils daily.    [provider]  gabapentin  (NEURONTIN ) 300 MG capsule Take 300 mg by mouth 3 (three) times daily. 10/01/22   [provider]  haloperidol  (HALDOL ) 0.5 MG tablet Take 0.5 mg by mouth 2 (two) times daily.    [provider]  hydrochlorothiazide  (HYDRODIURIL ) 25 MG tablet Take 25 mg by mouth daily.    [provider]  lisinopril  (ZESTRIL ) 30 MG tablet Take 30 mg by mouth daily. 08/03/22   [provider]  loperamide (IMODIUM) 2 MG capsule Take 2 mg by mouth as needed for diarrhea or loose stools. Take 1 capsule by mouth as needed with each loose stool/diarrhea nte 8 doses in 24 hr    [provider]  Multiple Vitamin (DAILY VITE PO) Take 1 tablet by mouth daily.    [provider]  naproxen  (NAPROSYN ) 500 MG tablet Take 500 mg by mouth every 12 (twelve) hours as needed.    [provider]  omeprazole (PRILOSEC) 20 MG capsule Take 20 mg by mouth daily.    [provider]  ondansetron  (ZOFRAN ) 4 MG tablet Take 4 mg by mouth every 8 (eight) hours as needed for nausea or vomiting.    [provider]  potassium chloride  SA (KLOR-CON  M) 20 MEQ tablet Take 20 mEq by mouth 2 (two) times daily. 08/03/22   [provider]  propranolol  (INDERAL ) 40 MG tablet Take 40 mg by mouth 2 (two) times daily.     [provider]  rosuvastatin  (CRESTOR ) 5 MG tablet Take 5 mg by mouth daily. 08/03/22   [provider]  sodium phosphate  CLOVIS) ENEM As directed for procedure 04/29/23   Rourk, Lamar HERO, MD  triamcinolone ointment (KENALOG) 0.1 % Apply topically. 04/05/23   [provider]    Physical Exam: Vitals:   07/09/24 1857 07/09/24 2229 07/09/24 2232 07/10/24 0712  BP: (!) 91/43 110/76 118/78 (!) 146/94  Pulse: (!) 106   (!) 108  Resp: 14   17  Temp: (!) 97.4 F (36.3 C)   97.9 F (36.6 C)  TempSrc:      SpO2: 100%   100%  Weight:      Height:       General.  Well-developed lady, in no acute distress. Pulmonary.  Lungs clear bilaterally, normal respiratory effort. CV.  Regular rate and rhythm, no JVD, rub or murmur. Abdomen.  Soft, nontender, nondistended, BS positive. CNS.  Alert and oriented .  No focal neurologic deficit. Extremities.  2+ LE edema, pulses intact and symmetrical.   Data Reviewed:  Prior data reviewed as mentioned  above  Family Communication:  Primary team communication: Discussed with primary team. Thank you very much for involving us  in the care of your patient.  Author: Amaryllis Dare, MD 07/10/2024 2:31 PM  For on call review [email or web address].     [1]  Allergies Allergen Reactions   Other     Salmon-esophagus swelled   Risperidone     Other Reaction(s): fever and flu like symptoms   Risperidone And Paliperidone

## 2024-07-10 NOTE — Progress Notes (Signed)
 D- Patient alert and oriented x 1. Pt presents with a pleasant mood and affect and states she slept well. Pt denies SI, HI, AVH,  but endorses pain. Worsening edema noted bilaterally on pt lower legs, pitting on right foot. MD made aware. New hospitalist consult and EKG performed due to pt vital ranges fluctuation and complain to slight chest pain. Lasix  ordered. HCTZ discontinued per hospitalist recommendation.   A- Scheduled medications administered to patient, per MD orders. Support and encouragement provided.  Routine safety checks conducted every 15 minutes.  Patient informed to notify staff with problems or concerns.  R- No adverse drug reactions noted. Patient contracts for safety at this time. Patient compliant with medications and treatment plan. Patient receptive, calm, and cooperative. Patient interacts well with others on the unit.  Patient remains safe at this time.    Marialuisa Basara S.,RN

## 2024-07-10 NOTE — [facility] Counselor (Incomplete)
19th

## 2024-07-10 NOTE — Group Note (Signed)
 Physical/Occupational Therapy Group Note  Group Topic: Yoga  Group Date: 07/10/2024 Start Time: 1305 End Time: 1333 Facilitators: Kolbi Tofte, Alm Hamilton, PT   Group Description: Group participated with series of yoga poses, designed to emphasize functional sitting balance, core stability, generalized flexibility and overall posture.  Incorporated deep breathing techniques with poses, working to promote relaxation, mindfulness and focus with targeted activities.  Discussed benefits of yoga in improving mood and self-esteem, reducing stress and anxiety, and promoting functional strength, balance and core stability for each participant.  Discussed ways to integrate into each participants daily routine.  Provided handout with written and pictorial descriptions of included yoga movements to be utilized as appropriate outside of group time.  Therapeutic Goal(s):  Demonstrate safe ability to participate with yoga poses during group activity. Identify one benefit of participation with yoga poses as part of each participants exercise/movement routine. Identify 1-2 individual poses that participant feels most beneficial to his/her needs and that he/she can easily replicate outside of group.  Individual Participation: Pt actively participated with the discussion and physical activity components of the session and was able with min cuing to modify poses as needed for patient comfort  Participation Level: Active and Engaged   Participation Quality: Minimal Cues   Behavior: Appropriate and Cooperative   Speech/Thought Process: Focused   Affect/Mood: Appropriate and Happy   Insight: Moderate   Judgement: Moderate   Modes of Intervention: Activity, Discussion, and Education  Patient Response to Interventions:  Attentive, Engaged, Interested , and Receptive   Plan: Continue to engage patient in PT/OT groups 1 - 2x/week.  CHARM Hamilton Bertin PT, DPT 07/10/2024, 1:53 PM

## 2024-07-10 NOTE — Assessment & Plan Note (Signed)
 Very nonspecific intermittent chest pain which seems more like musculoskeletal.  EKG was negative for any significant abnormality. - Troponin negative x 2, echocardiogram done with pending results

## 2024-07-10 NOTE — Progress Notes (Signed)
 [facility] MD Progress Note  07/10/2024 3:41 PM Valerie [name]  MRN:  [id number]   Valerie [name] is a 63 year old female presenting voluntary to [facility] requesting a psychiatric evaluation for worsening psychosis. Patient has history of schizophrenia. Patient denied SI, HI, psychosis and alcohol/drug usage. Patient resides at [facility]. Patient reports people are coming by my door talking loud, yelling and screaming and coming in my room with keys. Patient reports having multiple medical Afibs and blood clots due to this. Patient reports people are doing things in the dining room. When asked if she called EMS, patient stated I called EMS, I stripped searched over the phone and called the authorities. Patient is admitted to [facility] unit with Q15 min safety monitoring. Multidisciplinary team approach is offered. Medication management; group/milieu therapy is offered.   [name] : [phone]- called on 07/07/24 went to generic voicemail-sister called back and provider discussed at length the current treatment plan of patient being on Clozaril -intermittently refusing, enforced with Zyprexa  IM and recent and addition of Geodon .  Due to patient's ongoing poor response to medications and ongoing delusions discussed the plan of switching Clozaril  to another antipsychotic that comes in LAI form.  Clozaril  may not be the best option for patients who are noncompliant and refusing labs.  Sister is agreeable with the plan  Subjective:  Chart reviewed, case discussed in multidisciplinary meeting, patient seen during rounds.  Per nursing report patient slept fine.  Continues to be labile.  Made inappropriate comments of saying that she is giving people orgasm.  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  Patient has marked leg swelling, reports chest pain, and blood pressure was noted to be fluctuating from low to high.  Consulted  hospitalist also given patient is also on clozapine  but no history of heart failure.  Continues to deny any auditory visual hallucinations able to talk when directly approached but continues to be noted to be singing out loud in the unit.  Still elated and happy.  When asked about auditory and visual hallucinations she said no and then said undifferentiated.   Past Psychiatric History: see h&P Family History:  Family History  Problem Relation Age of Onset   Colon cancer Neg Hx    Celiac disease Neg Hx    Inflammatory bowel disease Neg Hx    Social History:  Social History   Substance and Sexual Activity  Alcohol Use Never     Social History   Substance and Sexual Activity  Drug Use Never    Social History   Socioeconomic History   Marital status: Single    Spouse name: Not on file   Number of children: Not on file   Years of education: Not on file   Highest education level: Not on file  Occupational History   Not on file  Tobacco Use   Smoking status: Never   Smokeless tobacco: Never  Substance and Sexual Activity   Alcohol use: Never   Drug use: Never   Sexual activity: Not Currently    Birth control/protection: Pill  Other Topics Concern   Not on file  Social History Narrative   Not on file   Social Drivers of Health   Tobacco Use: Low Risk (06/19/2024)   Patient History    Smoking Tobacco Use: Never    Smokeless Tobacco Use: Never    Passive Exposure: Not on file  Financial Resource Strain: Not  on file  Food Insecurity: Unknown (06/19/2024)   Epic    Worried About Programme Researcher, Broadcasting/film/video in the Last Year: Never true    Ran Out of Food in the Last Year: Patient declined  Transportation Needs: No Transportation Needs (06/19/2024)   Epic    Lack of Transportation (Medical): No    Lack of Transportation (Non-Medical): No  Physical Activity: Not on file  Stress: Not on file  Social Connections: Not on file  Depression (PHQ2-9): Low Risk (12/21/2022)    Depression (PHQ2-9)    PHQ-2 Score: 0  Alcohol Screen: Low Risk (06/19/2024)   Alcohol Screen    Last Alcohol Screening Score (AUDIT): 0  Housing: Low Risk (06/19/2024)   Epic    Unable to Pay for Housing in the Last Year: No    Number of Times Moved in the Last Year: 0    Homeless in the Last Year: No  Utilities: Not At Risk (06/19/2024)   Epic    Threatened with loss of utilities: No  Health Literacy: Not on file   Past Medical History:  Past Medical History:  Diagnosis Date   Chronic lower extremity pain (2ry area of Pain) (Right) 12/21/2022   Chronic pain syndrome 12/20/2022   DDD (degenerative disc disease), 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 Tobie: Pancolonic diverticulosis, tortuous colon, next colonoscopy 2024   TONSILLECTOMY      Current Medications: Current Facility-Administered Medications  Medication Dose Route Frequency Provider Last Rate Last Admin   acetaminophen  (TYLENOL ) tablet 650 mg  650 mg Oral Q6H PRN Onuoha, Chinwendu V, NP   650 mg at 07/02/24 0559   aspirin  EC tablet 81 mg  81 mg Oral Daily Bobbitt, Shalon E, NP   81 mg at 07/10/24 1118   bisacodyl  (DULCOLAX) EC tablet 10 mg  10 mg Oral Daily PRN Jadapalle, Sree, MD       cholecalciferol  (VITAMIN D3) 25 MCG (1000 UNIT) tablet 1,000 Units  1,000 Units Oral Weekly Jadapalle, Sree, MD   1,000 Units at 07/04/24 1201   cloZAPine  (CLOZARIL ) tablet 100 mg  100 mg Oral BID Jadapalle, Sree, MD   100 mg at 07/10/24 1118   Or   OLANZapine  (ZYPREXA ) injection 10 mg  10 mg Intramuscular BID Jadapalle, Sree, MD   10 mg at 07/05/24 2233   diclofenac  Sodium (VOLTAREN ) 1 % topical gel 4 g  4 g Topical QID Donnelly Mellow, MD   4 g at 07/08/24 2225   DULoxetine  (CYMBALTA ) DR capsule 40 mg  40 mg Oral Daily Jadapalle, Sree, MD   40 mg at 07/10/24 1116   famotidine  (PEPCID )  tablet 40 mg  40 mg Oral Daily Afsa Meany, MD   40 mg at 07/10/24 1117   fluticasone  (FLONASE ) 50 MCG/ACT nasal spray 1 spray  1 spray Each Nare Daily Bobbitt, Shalon E, NP       furosemide  (LASIX ) tablet 40 mg  40 mg Oral Daily Amin, Sumayya, MD   40 mg at 07/10/24 1409   gabapentin  (NEURONTIN ) capsule 300 mg  300 mg Oral TID Bobbitt, Shalon E, NP   300 mg at 07/10/24 1116   hydrOXYzine  (ATARAX ) tablet 25 mg  25 mg Oral TID PRN Bobbitt, Shalon E, NP   25 mg at 06/26/24 0731   linagliptin  (TRADJENTA ) tablet  5 mg  5 mg Oral Daily Bobbitt, Shalon E, NP   5 mg at 07/10/24 1117   lisinopril  (ZESTRIL ) tablet 20 mg  20 mg Oral Daily Revia Nghiem, MD   20 mg at 07/10/24 1117   magnesium  hydroxide (MILK OF MAGNESIA) suspension 30 mL  30 mL Oral Daily PRN Bobbitt, Shalon E, NP       metFORMIN  (GLUCOPHAGE ) tablet 1,000 mg  1,000 mg Oral Q supper Bobbitt, Shalon E, NP   500 mg at 07/09/24 1628   multivitamin with minerals tablet 1 tablet  1 tablet Oral Daily Bobbitt, Shalon E, NP   1 tablet at 07/10/24 1117   naproxen  (NAPROSYN ) tablet 375 mg  375 mg Oral TID WC Jadapalle, Sree, MD   375 mg at 07/10/24 1409   OLANZapine  (ZYPREXA ) injection 5 mg  5 mg Intramuscular TID PRN Bobbitt, Shalon E, NP   5 mg at 06/22/24 2259   OLANZapine  zydis (ZYPREXA ) disintegrating tablet 5 mg  5 mg Oral TID PRN Bobbitt, Shalon E, NP   5 mg at 07/02/24 2123   ondansetron  (ZOFRAN ) tablet 4 mg  4 mg Oral Q8H PRN Bobbitt, Shalon E, NP       potassium chloride  SA (KLOR-CON  M) CR tablet 20 mEq  20 mEq Oral BID Bobbitt, Shalon E, NP   20 mEq at 07/10/24 1117   propranolol  (INDERAL ) tablet 40 mg  40 mg Oral BID Amin, Sumayya, MD       rosuvastatin  (CRESTOR ) tablet 5 mg  5 mg Oral Daily Bobbitt, Shalon E, NP   5 mg at 07/10/24 1117   sodium chloride  0.9 % bolus 500 mL  500 mL Intravenous Once Pleas Carneal, MD       traZODone  (DESYREL ) tablet 50 mg  50 mg Oral QHS PRN Bobbitt, Shalon E, NP       ziprasidone   (GEODON ) capsule 20 mg  20 mg Oral QHS Jadapalle, Sree, MD   20 mg at 07/09/24 2224   zolpidem  (AMBIEN ) tablet 10 mg  10 mg Oral QHS Jadapalle, Sree, MD   10 mg at 07/09/24 2221    Lab Results:  Results for orders placed or performed during the hospital encounter of 06/19/24 (from the past 48 hours)  Troponin T, High Sensitivity     Status: None   Collection Time: 07/10/24  2:20 PM  Result Value Ref Range   Troponin T High Sensitivity <15 0 - 19 ng/L    Comment: (NOTE) Biotin concentrations > 1000 ng/mL falsely decrease TnT results.  Serial cardiac troponin measurements are suggested.  Refer to the Links section for chest pain algorithms and additional  guidance. Performed at [facility], [address] [place], [place] [address]   CBC     Status: Abnormal   Collection Time: 07/10/24  2:20 PM  Result Value Ref Range   WBC 7.7 4.0 - 10.5 K/uL   RBC 4.05 3.87 - 5.11 MIL/uL   Hemoglobin 11.1 (L) 12.0 - 15.0 g/dL   HCT 64.8 (L) 63.9 - 53.9 %   MCV 86.7 80.0 - 100.0 fL   MCH 27.4 26.0 - 34.0 pg   MCHC 31.6 30.0 - 36.0 g/dL   RDW 86.5 88.4 - 84.4 %   Platelets 340 150 - 400 K/uL   nRBC 0.0 0.0 - 0.2 %    Comment: Performed at [facility], [address], [place], [place] [address]  Comprehensive metabolic panel     Status: Abnormal   Collection  Time: 07/10/24  2:20 PM  Result Value Ref Range   Sodium 136 135 - 145 mmol/L   Potassium 4.4 3.5 - 5.1 mmol/L   Chloride 101 98 - 111 mmol/L   CO2 27 22 - 32 mmol/L   Glucose, Bld 102 (H) 70 - 99 mg/dL    Comment: Glucose reference range applies only to samples taken after fasting for at least 8 hours.   BUN 10 8 - 23 mg/dL   Creatinine, Ser 9.36 0.44 - 1.00 mg/dL   Calcium  9.5 8.9 - 10.3 mg/dL   Total Protein 6.6 6.5 - 8.1 g/dL   Albumin 3.7 3.5 - 5.0 g/dL   AST 16 15 - 41 U/L   ALT 12 0 - 44 U/L   Alkaline Phosphatase 111 38 - 126 U/L   Total Bilirubin <0.2 0.0 - 1.2 mg/dL   GFR, Estimated >39 >39 mL/min     Comment: (NOTE) Calculated using the CKD-EPI Creatinine Equation (2021)    Anion gap 9 5 - 15    Comment: Performed at [facility], [address] [place], [place] [address]        Blood Alcohol level:  Lab Results  Component Value Date   [facility] <15 06/18/2024    Metabolic Disorder Labs: Lab Results  Component Value Date   HGBA1C 5.9 (H) 06/28/2024   MPG 122.63 06/28/2024   No results found for: PROLACTIN Lab Results  Component Value Date   CHOL 156 06/28/2024   TRIG 89 06/28/2024   HDL 70 06/28/2024   CHOLHDL 2.2 06/28/2024   VLDL 18 06/28/2024   LDLCALC 69 06/28/2024    Physical Findings: AIMS:  , ,  ,  ,    CIWA:    COWS:      Psychiatric Specialty Exam:  Presentation  General Appearance:  Casual  Eye Contact: Fleeting  Speech: Normal Rate  Speech Volume: Increased    Mood and Affect  Mood: Euphoric  Affect: Labile   Thought Process  Thought Processes: Disorganized  Orientation:Full (Time, Place and Person)  Thought Content:Illogical; Delusions; Tangential  Hallucinations: Denies  Ideas of Reference:Delusions  Suicidal Thoughts: Denies  Homicidal Thoughts: Denies   Sensorium  Memory: Immediate Fair; Remote Fair  Judgment: Impaired  Insight: Shallow   Executive Functions  Concentration: Poor  Attention Span: Poor  Recall: [employer] of Knowledge: Fair  Language: Fair   Psychomotor Activity  Psychomotor Activity: No data recorded  Musculoskeletal: Strength & Muscle Tone: within normal limits Gait & Station: normal Assets  Assets: Manufacturing Systems Engineer; Desire for Improvement    Physical Exam: Physical Exam Vitals and nursing note reviewed.    ROS Blood pressure (!) 146/94, pulse (!) 108, temperature 97.9 F (36.6 C), resp. rate 17, height 5' 5 (1.651 m), weight 81 kg, SpO2 100%. Body mass index is 29.7 kg/m.  Diagnosis: Active Problems:   HTN (hypertension)    Hypercholesteremia   Schizoaffective disorder, bipolar type (HCC)   Bilateral lower extremity edema   Chest pain   Sinus tachycardia  Schizoaffective disorder bipolar type  Clinical Decision Making: Patient is currently admitted for worsening psychosis, manic symptoms, tangential grandiosity.  She needs inpatient hospitalization for medication management and close monitoring.  Patient is unable to make decisions on her own as she is unable to appreciate and understand her mental health history, the current need for treatment.  [facility] has screened her case and for further evaluation for legal guardianship. [facility] screened in her case. Sister and  mom are involved in her care.  Treatment Plan Summary:  Safety and Monitoring:             -- inoluntary admission to inpatient psychiatric unit for safety, stabilization and treatment             -- Daily contact with patient to assess and evaluate symptoms and progress in treatment             -- Patient's case to be discussed in multi-disciplinary team meeting             -- Observation Level: q15 minute checks             -- Vital signs:  q12 hours             -- Precautions: suicide, elopement, and assault   2. Psychiatric Diagnoses and Treatment:  Discontinue Depakote  as patient is noncompliant ContGeodon 20 mg nightly as patient is agreeable to take Geodon  Clozaril  100 mg twice daily enforced with zyprexa  10mg  BID- Cymbalta  40 mg daily   -- The risks/benefits/side-effects/alternatives to this medication were discussed in detail with the patient and time was given for questions. The patient consents to medication trial.                -- Metabolic profile and EKG monitoring obtained while on an atypical antipsychotic (BMI: Lipid Panel: HbgA1c: QTc:)              -- Encouraged patient to participate in unit milieu and in scheduled group therapies                            3. Medical Issues Being Addressed:   Chest pain Very nonspecific  intermittent chest pain which seems more like musculoskeletal.  EKG was negative for any significant abnormality. - Troponin and echocardiogram ordered   Sinus tachycardia Heart rate mostly in low 100.  Seems like chronic sinus tachycardia.  Patient takes Inderal  40 mg twice daily which was decreased to 20 mg twice daily recently. - Increasing Inderal  to 40 mg twice daily -Check TSH   HTN (hypertension) Patient with some variation in her blood pressure from borderline soft to high. Takes HCTZ 25 mg daily, lisinopril  30 mg daily and Inderal  40 mg twice daily at home. Psych decreased the dose of lisinopril  to 20 mg daily and Inderal  to 20 mg twice daily as her blood pressure was borderline soft yesterday. - Increasing Inderal  back to home dose of 40 mg twice daily due to borderline sinus tachycardia which seems chronic. - Continuing lisinopril  at 20 mg daily -Discontinuing HCTZ and replacing it with Lasix  40 mg daily -Continue to monitor -Checking basic labs which include CBC and CMP   Bilateral lower extremity edema Per patient worsening bilateral lower extremity edema.  No shortness of breath or orthopnea.  No PND. No prior echocardiogram or history of heart failure. - Ordered echocardiogram - Started on p.o. Lasix  40 mg daily - Patient is refusing compression stockings - Encourage raising legs while sitting.     Hypercholesteremia - Continuing home Crestor   4. Discharge Planning:   -- Social work and case management to assist with discharge planning and identification of hospital follow-up needs prior to discharge  -- Estimated LOS: 3-4 days  Desmond Chimera, MD 07/10/2024, 3:41 PM

## 2024-07-10 NOTE — Group Note (Signed)
 Date:  07/10/2024 Time:  11:31 PM  Group Topic/Focus:  Wrap-Up Group:   The focus of this group is to help patients review their daily goal of treatment and discuss progress on daily workbooks.    Participation Level:  Active  Participation Quality:  Appropriate  Affect:  Appropriate  Cognitive:  Alert  Insight: Appropriate  Engagement in Group:  Engaged  Modes of Intervention:  Discussion  Additional Comments:    Valerie [name] [place] 07/10/2024, 11:31 PM

## 2024-07-10 NOTE — Assessment & Plan Note (Signed)
 Heart rate mostly in low 100.  Seems like chronic sinus tachycardia.  Patient takes Inderal  40 mg twice daily which was decreased to 20 mg twice daily recently. - Increasing Inderal  to 40 mg twice daily -Check TSH

## 2024-07-10 NOTE — Progress Notes (Signed)
" °   07/09/24 2100  Psych Admission Type (Psych Patients Only)  Admission Status Voluntary  Psychosocial Assessment  Patient Complaints Hyperactivity  Eye Contact Suspiciousness  Facial Expression Animated  Affect Inconsistent with thought content  Speech Slow  Interaction Assertive  Motor Activity Slow  Appearance/Hygiene Unremarkable  Behavior Characteristics Cooperative;Calm  Mood Preoccupied;Pleasant  Thought Process  Coherency Tangential  Content Preoccupation  Delusions Grandeur  Perception WDL  Hallucination None reported or observed  Judgment Impaired  Confusion Mild  Danger to Self  Current suicidal ideation? Denies  Agreement Not to Harm Self Yes  Description of Agreement verbal  Danger to Others  Danger to Others None reported or observed    Mood/Behavior:  Pleasant and cooperative. Hyperactive. Pt bright and animated.   Psych assessment: Denies SI/HI and AVH.     Interaction / Group attendance:  Present in the milieu. Interacting with peers and staff. Speaking to everyone and being polite. Pt then noted to ramble about different topics. Pt heard saying I made him organism many times while smiling. Pt then repeating a phrase appears to be speaking a different language. When pt asked what it meant pt responded how are you? Attended group.   Medication/ PRNs: Compliant with scheduled medications. No prns required.   Pain: Denies  Per hospitalist recommendations only not consulted Get a manual blood pressure please on this patient. If the MAP is less than 65 consider having the psychiatrist ordered a bolus of 500 mL normal saline.  BP overnight 110/76 and 118/78 manual check. Pt asymptomatic. Pt given Gatorade and po fluids for hydration and pt complaint.   15 min checks in place for safety. "

## 2024-07-10 NOTE — Plan of Care (Signed)
" °  Problem: Education: Goal: Knowledge of [facility] Education information/materials will improve Outcome: Progressing Goal: Emotional status will improve Outcome: Progressing Goal: Mental status will improve Outcome: Progressing Goal: Verbalization of understanding the information provided will improve Outcome: Progressing   Problem: Activity: Goal: Interest or engagement in activities will improve Outcome: Progressing Goal: Sleeping patterns will improve Outcome: Progressing   Problem: Coping: Goal: Ability to verbalize frustrations and anger appropriately will improve Outcome: Progressing Goal: Ability to demonstrate self-control will improve Outcome: Progressing   Problem: Health Behavior/Discharge Planning: Goal: Identification of resources available to assist in meeting health care needs will improve Outcome: Progressing Goal: Compliance with treatment plan for underlying cause of condition will improve Outcome: Progressing   Problem: Physical Regulation: Goal: Ability to maintain clinical measurements within normal limits will improve Outcome: Progressing   Problem: Safety: Goal: Periods of time without injury will increase Outcome: Progressing   Problem: Education: Goal: Ability to state activities that reduce stress will improve Outcome: Progressing   Problem: Self-Concept: Goal: Ability to identify factors that promote anxiety will improve Outcome: Progressing Goal: Level of anxiety will decrease Outcome: Progressing Goal: Ability to modify response to factors that promote anxiety will improve Outcome: Progressing   Problem: Activity: Goal: Will verbalize the importance of balancing activity with adequate rest periods Outcome: Progressing   "

## 2024-07-10 NOTE — Assessment & Plan Note (Signed)
-   Continuing home Crestor

## 2024-07-11 ENCOUNTER — Inpatient Hospital Stay (HOSPITAL_COMMUNITY)
Admit: 2024-07-11 | Discharge: 2024-07-11 | Disposition: A | Discharge status: Home / Self Care | Attending: Internal Medicine | Admitting: Internal Medicine

## 2024-07-11 DIAGNOSIS — R079 Chest pain, unspecified: Secondary | ICD-10-CM

## 2024-07-11 DIAGNOSIS — I10 Essential (primary) hypertension: Secondary | ICD-10-CM | POA: Diagnosis not present

## 2024-07-11 DIAGNOSIS — R000 Tachycardia, unspecified: Secondary | ICD-10-CM | POA: Diagnosis not present

## 2024-07-11 DIAGNOSIS — R600 Localized edema: Secondary | ICD-10-CM | POA: Diagnosis not present

## 2024-07-11 DIAGNOSIS — F250 Schizoaffective disorder, bipolar type: Secondary | ICD-10-CM | POA: Diagnosis not present

## 2024-07-11 DIAGNOSIS — E7800 Pure hypercholesterolemia, unspecified: Secondary | ICD-10-CM | POA: Diagnosis not present

## 2024-07-11 LAB — ECHOCARDIOGRAM COMPLETE
AR max vel: 2.54 cm2
AV Area VTI: 2.56 cm2
AV Area mean vel: 2.51 cm2
AV Mean grad: 3 mmHg
AV Peak grad: 6.3 mmHg
Ao pk vel: 1.25 m/s
Area-P 1/2: 4.21 cm2
Height: 65 in
MV VTI: 2.44 cm2
S' Lateral: 1.6 cm
Weight: 2856 [oz_av]

## 2024-07-11 NOTE — Group Note (Signed)
 Recreation Therapy Group Note   Group Topic:Health and Wellness  Group Date: 07/11/2024 Start Time: 1425 End Time: 1445 Facilitators: Celestia Jeoffrey BRAVO, LRT, CTRS Location: Craft Room  Group Description: Seated Exercise. LRT discussed the mental and physical benefits of exercise. LRT and group discussed how physical activity can be used as a coping skill. Pt's and LRT followed along to an exercise video on the TV screen that provided a visual representation and audio description of every exercise performed. Pt's encouraged to listen to their bodies and stop at any time if they experience feelings of discomfort or pain. Pts were encouraged to drink water and stay hydrated.   Goal Area(s) Addressed:  Patient will learn benefits of physical activity. Patient will identify exercise as a coping skill.  Patient will follow multistep directions. Patient will try a new leisure interest.    Affect/Mood: N/A   Participation Level: Did not attend    Clinical Observations/Individualized Feedback: Patient did not attend.  Plan: Continue to engage patient in RT group sessions 2-3x/week.   Jeoffrey BRAVO Celestia, LRT, CTRS 07/11/2024 4:05 PM

## 2024-07-11 NOTE — Group Note (Signed)
 Date:  07/11/2024 Time:  8:38 PM  Group Topic/Focus:  Wrap-Up Group:   The focus of this group is to help patients review their daily goal of treatment and discuss progress on daily workbooks.    Participation Level:  Active  Participation Quality:  Appropriate  Affect:  Appropriate  Cognitive:  Appropriate  Insight: Appropriate  Engagement in Group:  Engaged  Modes of Intervention:  Discussion  Additional Comments:    Valerie [name] 07/11/2024, 8:38 PM

## 2024-07-11 NOTE — Group Note (Signed)
 LCSW Group Therapy Note   Group Date: 07/11/2024 Start Time: 1330 End Time: 1400   Type of Therapy and Topic:  Group Therapy: Challenging Core Beliefs  Participation Level:  Active  Description of Group:  Patients were educated about core beliefs and asked to identify one harmful core belief that they have. Patients were asked to explore from where those beliefs originate. Patients were asked to discuss how those beliefs make them feel and the resulting behaviors of those beliefs. They were then be asked if those beliefs are true and, if so, what evidence they have to support them. Lastly, group members were challenged to replace those negative core beliefs with helpful beliefs.   Therapeutic Goals:   1. Patient will identify harmful core beliefs and explore the origins of such beliefs. 2. Patient will identify feelings and behaviors that result from those core beliefs. 3. Patient will discuss whether such beliefs are true. 4.  Patient will replace harmful core beliefs with helpful ones.  Summary of Patient Progress:  Valerie [name] actively engaged in processing and exploring how core beliefs are formed and how they impact thoughts, feelings, and behaviors. Patient proved open to input from peers and feedback from CSW. Patient demonstrated NO insight into the subject matter, was respectful and supportive of peers, and participated throughout the entire session.  Therapeutic Modalities: Cognitive Behavioral Therapy; Solution-Focused Therapy   Valerie [name] Valerie [name], [place] 07/11/2024  2:00 PM

## 2024-07-11 NOTE — Plan of Care (Signed)
  Problem: Education: Goal: Emotional status will improve Outcome: Progressing Goal: Mental status will improve Outcome: Progressing   Problem: Activity: Goal: Interest or engagement in activities will improve Outcome: Progressing Goal: Sleeping patterns will improve Outcome: Progressing   Problem: Physical Regulation: Goal: Ability to maintain clinical measurements within normal limits will improve Outcome: Progressing   Problem: Safety: Goal: Periods of time without injury will increase Outcome: Progressing

## 2024-07-11 NOTE — Hospital Course (Addendum)
 Valerie [name] is a 63 y.o. female with past medical history of schizophrenia and bipolar disorder who was admitted at geropsych facility due to manic episode.  She was complaining of some intermittent chest pain, mostly involving upper retrosternal area, intermittent, no relationship with activity and no radiation.  No associated factors.  Sometimes pain increased with moving around her arms, worsening bilateral lower extremity edema, denies any shortness of breath orthopnea or PND.   Psych was also concerned about her fluctuating blood pressure going from borderline soft to little high. Psych was making some changes to her home medications.   12/30: Vitals and labs stable.  Troponin negative x 2. Echocardiogram done with pending results.  Small improvement in lower extremity edema so continuing p.o. Lasix .  Will need BMP every other day. 12/31.  Held Lasix  and lisinopril  secondary to low blood pressure.  Switched propranolol  over to Toprol -XL twice daily. 07/13/2024.  Blood pressure improved.  Will increase Lasix  to 40 mg orally twice daily for now.  Continue Toprol  XL. 1/2.  Continue Lasix  40 mg twice a day. 1/3.  Change diabetic medications over to Farxiga .  proBNP negative. 1/4.  Patient upset about some of the residents. 1/5.  Continue Lasix  twice a day. 1/6.  Patient wants to continue Lasix  twice a day.  I offered to decrease it down to once a day dosing.

## 2024-07-11 NOTE — Progress Notes (Signed)
 Consultation Progress Note   Patient: Valerie [name] FMW:[id number] DOB: [birth year] DOA: 06/19/2024 DOS: the patient was seen and examined on 07/11/2024 Primary service: Donnelly Mellow, MD  Brief hospital course: Valerie [name] is a 63 y.o. female with past medical history of schizophrenia and bipolar disorder who was admitted at geropsych facility due to manic episode.  She was complaining of some intermittent chest pain, mostly involving upper retrosternal area, intermittent, no relationship with activity and no radiation.  No associated factors.  Sometimes pain increased with moving around her arms, worsening bilateral lower extremity edema, denies any shortness of breath orthopnea or PND.   Psych was also concerned about her fluctuating blood pressure going from borderline soft to little high. Psych was making some changes to her home medications.   12/30: Vitals and labs stable.  Troponin negative x 2. Echocardiogram done with pending results.  Small improvement in lower extremity edema so continuing p.o. Lasix .  Will need BMP every other day.  Assessment and Plan: * Schizophrenia (HCC)-resolved as of 06/20/2024 Being managed by primary team.  Chest pain Very nonspecific intermittent chest pain which seems more like musculoskeletal.  EKG was negative for any significant abnormality. - Troponin negative x 2, echocardiogram done with pending results  Sinus tachycardia Heart rate mostly in low 100.  Seems like chronic sinus tachycardia.  Patient takes Inderal  40 mg twice daily which was decreased to 20 mg twice daily recently. - Increasing Inderal  to 40 mg twice daily - TSH within lower normal limit  HTN (hypertension) Patient with some variation in her blood pressure from borderline soft to high. Takes HCTZ 25 mg daily, lisinopril  30 mg daily and Inderal  40 mg twice daily at home. Psych decreased the dose of lisinopril  to 20 mg daily and Inderal  to 20 mg twice daily as her blood  pressure was borderline soft yesterday. 12/30.  Blood pressure within goal today - Continue home dose of Inderal  at 40 mg twice daily due to borderline sinus tachycardia which seems chronic. - Continuing lisinopril  at 20 mg daily -Discontinuing HCTZ and replacing it with Lasix  40 mg daily -Continue to monitor - Monitor BMP every other day  Bilateral lower extremity edema Per patient worsening bilateral lower extremity edema.  No shortness of breath or orthopnea.  No PND. No prior echocardiogram or history of heart failure. - Ordered echocardiogram-pending results - Started on p.o. Lasix  40 mg daily - Patient is refusing compression stockings - Encourage raising legs while sitting.   Hypercholesteremia - Continuing home Crestor    TRH will continue to follow the patient.  Subjective: Patient was seen and examined today.  Denies any chest pain.  Lower extremity edema seems improving.  Physical Exam: Vitals:   07/09/24 2232 07/10/24 0712 07/10/24 1915 07/11/24 0721  BP: 118/78 (!) 146/94 (!) 123/58 135/70  Pulse:  (!) 108 93 (!) 113  Resp:  17  18  Temp:  97.9 F (36.6 C)  (!) 97.3 F (36.3 C)  TempSrc:      SpO2:  100% 100% 100%  Weight:      Height:       General.  Well-developed lady, in no acute distress. Pulmonary.  Lungs clear bilaterally, normal respiratory effort. CV.  Regular rate and rhythm, no JVD, rub or murmur. Abdomen.  Soft, nontender, nondistended, BS positive. CNS.  Alert and oriented .  No focal neurologic deficit. Extremities.  2+ LE edema,  pulses intact and symmetrical.  Lower extremity little less tight as compared to yesterday  Data Reviewed: Prior data reviewed  Family Communication: Primary team is communicating with family  Time spent: 45 minutes.  This record has been created using Conservation officer, historic buildings. Errors have been sought and corrected,but may not always be located. Such creation errors do not reflect on the standard of  care.   Author: Amaryllis Dare, MD 07/11/2024 3:33 PM  For on call review [email or web address].

## 2024-07-11 NOTE — Progress Notes (Signed)
" ° °  Mellody Louder with DSS requested an call regarding Patient. She will be out of the office tomorrow. She asked that [place] return the call as soon as possible. Cell phone number: [phone]. "

## 2024-07-11 NOTE — Progress Notes (Signed)
 Patient was calm and cooperative for most of the shift. She expressed anger regarding the requirement to take clozapine . Mood was elated, was very talkative, and grandiose thoughts were noted. Sleep Hours - 4.25 Patient concerns - Anxiety 4/10 , denied depression, SI/HI and AVH    07/10/24 2300  Psych Admission Type (Psych Patients Only)  Admission Status Voluntary  Psychosocial Assessment  Patient Complaints None  Eye Contact Fair  Facial Expression Animated  Affect Appropriate to circumstance;Inconsistent with thought content  Speech Unremarkable  Interaction Assertive  Motor Activity Slow  Appearance/Hygiene Unremarkable  Behavior Characteristics Cooperative;Calm  Mood Pleasant  Thought Process  Coherency Tangential  Content Preoccupation  Delusions Grandeur  Perception WDL  Hallucination None reported or observed  Judgment Impaired  Confusion Mild  Danger to Self  Current suicidal ideation? Denies  Agreement Not to Harm Self Yes  Description of Agreement  (verbal)  Danger to Others  Danger to Others None reported or observed

## 2024-07-11 NOTE — Group Note (Signed)
 Date:  07/11/2024 Time:  10:49 AM  Group Topic/Focus:   Effective coping skills for seniors involve a mix of mental, physical, and social activities, like mindfulness, light exercise (walking, yoga), creative outlets (art, music, writing), maintaining routines, and fostering social connections, which all reduce stress, combat loneliness, boost mood, and build resilience for life's changes. Deep breathing and journaling also offer immediate calm, while therapy can provide professional support.   Participation Level:  Did Not Attend  [name] 07/11/2024, 10:49 AM

## 2024-07-11 NOTE — Progress Notes (Signed)
 [facility] MD Progress Note  07/11/2024 12:38 PM Valerie [name]  MRN:  [id number]   Valerie [name] is a 63 year old female presenting voluntary to [facility] requesting a psychiatric evaluation for worsening psychosis. Patient has history of schizophrenia. Patient denied SI, HI, psychosis and alcohol/drug usage. Patient resides at [facility]. Patient reports people are coming by my door talking loud, yelling and screaming and coming in my room with keys. Patient reports having multiple medical Afibs and blood clots due to this. Patient reports people are doing things in the dining room. When asked if she called EMS, patient stated I called EMS, I stripped searched over the phone and called the authorities. Patient is admitted to [place] unit with Q15 min safety monitoring. Multidisciplinary team approach is offered. Medication management; group/milieu therapy is offered.   Sister Celita Aron : [phone] called on 07/07/24 went to generic voicemail-sister called back and provider discussed at length the current treatment plan of patient being on Clozaril -intermittently refusing, enforced with Zyprexa  IM and recent and addition of Geodon .  Due to patient's ongoing poor response to medications and ongoing delusions discussed the plan of switching Clozaril  to another antipsychotic that comes in LAI form.  Clozaril  may not be the best option for patients who are noncompliant and refusing labs.  Sister is agreeable with the plan  Subjective:  Chart reviewed, case discussed in multidisciplinary meeting, patient seen during rounds.  Prior nursing  reports the patient needs significant coaching/effort in order to take her medication.  Nurses report patient continues to be little flirtatious towards the female tech and generally take medication to female tech. Appreciate hospitalist recommendation.  Patient continues to have leg swelling today.  Echo still pending.  Patient on interview  continues t to show concrete thought process.  Patient still noticed to be singing out loud, and at times talking to the TV.  Patient though when talked directly is able to have meaningful conversation and answer appropriately.  Patient today reports that she has been staying at [facility] for the last 15 years and have asked them to take her to [facility] because she had trouble sleeping.    Past Psychiatric History: see h&P Family History:  Family History  Problem Relation Age of Onset   Colon cancer Neg Hx    Celiac disease Neg Hx    Inflammatory bowel disease Neg Hx    Social History:  Social History   Substance and Sexual Activity  Alcohol Use Never     Social History   Substance and Sexual Activity  Drug Use Never    Social History   Socioeconomic History   Marital status: Single    Spouse name: Not on file   Number of children: Not on file   Years of education: Not on file   Highest education level: Not on file  Occupational History   Not on file  Tobacco Use   Smoking status: Never   Smokeless tobacco: Never  Substance and Sexual Activity   Alcohol use: Never   Drug use: Never   Sexual activity: Not Currently    Birth control/protection: Pill  Other Topics Concern   Not on file  Social History Narrative   Not on file   Social Drivers of Health   Tobacco Use: Low Risk (06/19/2024)   Patient History    Smoking Tobacco Use: Never    Smokeless Tobacco Use: Never    Passive Exposure: Not on file  Financial Resource  Strain: Not on file  Food Insecurity: Unknown (06/19/2024)   Epic    Worried About Programme Researcher, Broadcasting/film/video in the Last Year: Never true    Ran Out of Food in the Last Year: Patient declined  Transportation Needs: No Transportation Needs (06/19/2024)   Epic    Lack of Transportation (Medical): No    Lack of Transportation (Non-Medical): No  Physical Activity: Not on file  Stress: Not on file  Social Connections: Not on file  Depression  (PHQ2-9): Low Risk (12/21/2022)   Depression (PHQ2-9)    PHQ-2 Score: 0  Alcohol Screen: Low Risk (06/19/2024)   Alcohol Screen    Last Alcohol Screening Score (AUDIT): 0  Housing: Low Risk (06/19/2024)   Epic    Unable to Pay for Housing in the Last Year: No    Number of Times Moved in the Last Year: 0    Homeless in the Last Year: No  Utilities: Not At Risk (06/19/2024)   Epic    Threatened with loss of utilities: No  Health Literacy: Not on file   Past Medical History:  Past Medical History:  Diagnosis Date   Chronic lower extremity pain (2ry area of Pain) (Right) 12/21/2022   Chronic pain syndrome 12/20/2022   DDD (degenerative disc disease), 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 Tobie: Pancolonic diverticulosis, tortuous colon, next colonoscopy 2024   TONSILLECTOMY      Current Medications: Current Facility-Administered Medications  Medication Dose Route Frequency Provider Last Rate Last Admin   acetaminophen  (TYLENOL ) tablet 650 mg  650 mg Oral Q6H PRN Onuoha, Chinwendu V, NP   650 mg at 07/02/24 0559   aspirin  EC tablet 81 mg  81 mg Oral Daily Bobbitt, Shalon E, NP   81 mg at 07/10/24 1118   bisacodyl  (DULCOLAX) EC tablet 10 mg  10 mg Oral Daily PRN Jadapalle, Sree, MD       cholecalciferol  (VITAMIN D3) 25 MCG (1000 UNIT) tablet 1,000 Units  1,000 Units Oral Weekly Jadapalle, Sree, MD   1,000 Units at 07/04/24 1201   cloZAPine  (CLOZARIL ) tablet 100 mg  100 mg Oral BID Jadapalle, Sree, MD   100 mg at 07/10/24 2107   Or   OLANZapine  (ZYPREXA ) injection 10 mg  10 mg Intramuscular BID Jadapalle, Sree, MD   10 mg at 07/05/24 2233   diclofenac  Sodium (VOLTAREN ) 1 % topical gel 4 g  4 g Topical QID Donnelly Mellow, MD   4 g at 07/10/24 2112   DULoxetine  (CYMBALTA ) DR capsule 40 mg  40 mg Oral Daily Jadapalle, Sree, MD   40 mg at  07/10/24 1116   famotidine  (PEPCID ) tablet 40 mg  40 mg Oral Daily Lorne Winkels, MD   40 mg at 07/10/24 1117   fluticasone  (FLONASE ) 50 MCG/ACT nasal spray 1 spray  1 spray Each Nare Daily Bobbitt, Shalon E, NP       furosemide  (LASIX ) tablet 40 mg  40 mg Oral Daily Amin, Sumayya, MD   40 mg at 07/10/24 1409   gabapentin  (NEURONTIN ) capsule 300 mg  300 mg Oral TID Bobbitt, Shalon E, NP   300 mg at 07/10/24 2107   hydrOXYzine  (ATARAX ) tablet 25 mg  25 mg Oral TID PRN Bobbitt, Shalon E, NP   25 mg at 06/26/24 0731   linagliptin  (  TRADJENTA ) tablet 5 mg  5 mg Oral Daily Bobbitt, Shalon E, NP   5 mg at 07/10/24 1117   lisinopril  (ZESTRIL ) tablet 20 mg  20 mg Oral Daily Nanette Wirsing, MD   20 mg at 07/10/24 1117   magnesium  hydroxide (MILK OF MAGNESIA) suspension 30 mL  30 mL Oral Daily PRN Bobbitt, Shalon E, NP       metFORMIN  (GLUCOPHAGE ) tablet 1,000 mg  1,000 mg Oral Q supper Bobbitt, Shalon E, NP   1,000 mg at 07/10/24 1723   multivitamin with minerals tablet 1 tablet  1 tablet Oral Daily Bobbitt, Shalon E, NP   1 tablet at 07/10/24 1117   naproxen  (NAPROSYN ) tablet 375 mg  375 mg Oral TID WC Jadapalle, Sree, MD   375 mg at 07/10/24 1821   OLANZapine  (ZYPREXA ) injection 5 mg  5 mg Intramuscular TID PRN Bobbitt, Shalon E, NP   5 mg at 06/22/24 2259   OLANZapine  zydis (ZYPREXA ) disintegrating tablet 5 mg  5 mg Oral TID PRN Bobbitt, Shalon E, NP   5 mg at 07/02/24 2123   ondansetron  (ZOFRAN ) tablet 4 mg  4 mg Oral Q8H PRN Bobbitt, Shalon E, NP       potassium chloride  SA (KLOR-CON  M) CR tablet 20 mEq  20 mEq Oral BID Bobbitt, Shalon E, NP   20 mEq at 07/10/24 2107   propranolol  (INDERAL ) tablet 40 mg  40 mg Oral BID Amin, Sumayya, MD       rosuvastatin  (CRESTOR ) tablet 5 mg  5 mg Oral Daily Bobbitt, Shalon E, NP   5 mg at 07/10/24 1117   sodium chloride  0.9 % bolus 500 mL  500 mL Intravenous Once Alonza Knisley, MD       traZODone  (DESYREL ) tablet 50 mg  50 mg Oral QHS PRN  Bobbitt, Shalon E, NP       ziprasidone  (GEODON ) capsule 20 mg  20 mg Oral QHS Jadapalle, Sree, MD   20 mg at 07/10/24 2107   zolpidem  (AMBIEN ) tablet 10 mg  10 mg Oral QHS Jadapalle, Sree, MD   10 mg at 07/10/24 2107    Lab Results:  Results for orders placed or performed during the hospital encounter of 06/19/24 (from the past 48 hours)  Troponin T, High Sensitivity     Status: None   Collection Time: 07/10/24  2:20 PM  Result Value Ref Range   Troponin T High Sensitivity <15 0 - 19 ng/L    Comment: (NOTE) Biotin concentrations > 1000 ng/mL falsely decrease TnT results.  Serial cardiac troponin measurements are suggested.  Refer to the Links section for chest pain algorithms and additional  guidance. Performed at [facility], [address] [place], [place] [address]   CBC     Status: Abnormal   Collection Time: 07/10/24  2:20 PM  Result Value Ref Range   WBC 7.7 4.0 - 10.5 K/uL   RBC 4.05 3.87 - 5.11 MIL/uL   Hemoglobin 11.1 (L) 12.0 - 15.0 g/dL   HCT 64.8 (L) 63.9 - 53.9 %   MCV 86.7 80.0 - 100.0 fL   MCH 27.4 26.0 - 34.0 pg   MCHC 31.6 30.0 - 36.0 g/dL   RDW 86.5 88.4 - 84.4 %   Platelets 340 150 - 400 K/uL   nRBC 0.0 0.0 - 0.2 %    Comment: Performed at [facility], [address] [place], [place] [address]  Comprehensive metabolic panel     Status: Abnormal  Collection Time: 07/10/24  2:20 PM  Result Value Ref Range   Sodium 136 135 - 145 mmol/L   Potassium 4.4 3.5 - 5.1 mmol/L   Chloride 101 98 - 111 mmol/L   CO2 27 22 - 32 mmol/L   Glucose, Bld 102 (H) 70 - 99 mg/dL    Comment: Glucose reference range applies only to samples taken after fasting for at least 8 hours.   BUN 10 8 - 23 mg/dL   Creatinine, Ser 9.36 0.44 - 1.00 mg/dL   Calcium  9.5 8.9 - 10.3 mg/dL   Total Protein 6.6 6.5 - 8.1 g/dL   Albumin 3.7 3.5 - 5.0 g/dL   AST 16 15 - 41 U/L   ALT 12 0 - 44 U/L   Alkaline Phosphatase 111 38 - 126 U/L   Total Bilirubin <0.2 0.0 - 1.2  mg/dL   GFR, Estimated >39 >39 mL/min    Comment: (NOTE) Calculated using the CKD-EPI Creatinine Equation (2021)    Anion gap 9 5 - 15    Comment: Performed at [facility], [address] [place], [place] [address]  TSH     Status: None   Collection Time: 07/10/24  6:37 PM  Result Value Ref Range   TSH 0.391 0.350 - 4.500 uIU/mL    Comment: Performed at [facility], [address] [place], [place] [address]  Troponin T, High Sensitivity     Status: None   Collection Time: 07/10/24  6:37 PM  Result Value Ref Range   Troponin T High Sensitivity <15 0 - 19 ng/L    Comment: (NOTE) Biotin concentrations > 1000 ng/mL falsely decrease TnT results.  Serial cardiac troponin measurements are suggested.  Refer to the Links section for chest pain algorithms and additional  guidance. Performed at [facility], [address] [place], [place] [address]         Blood Alcohol level:  Lab Results  Component Value Date   [facility] <15 06/18/2024    Metabolic Disorder Labs: Lab Results  Component Value Date   HGBA1C 5.9 (H) 06/28/2024   MPG 122.63 06/28/2024   No results found for: PROLACTIN Lab Results  Component Value Date   CHOL 156 06/28/2024   TRIG 89 06/28/2024   HDL 70 06/28/2024   CHOLHDL 2.2 06/28/2024   VLDL 18 06/28/2024   LDLCALC 69 06/28/2024    Physical Findings: AIMS:  , ,  ,  ,    CIWA:    COWS:      Psychiatric Specialty Exam:  Presentation  General Appearance:  Casual  Eye Contact: Fleeting  Speech: Normal Rate  Speech Volume: Increased    Mood and Affect  Mood: Euphoric  Affect: Labile   Thought Process  Thought Processes: Disorganized  Orientation:Full (Time, Place and Person)  Thought Content:Illogical; Delusions; Tangential  Hallucinations: Denies  Ideas of Reference:Delusions  Suicidal Thoughts: Denies  Homicidal Thoughts: Denies   Sensorium  Memory: Immediate Fair; Remote  Fair  Judgment: Impaired  Insight: Shallow   Executive Functions  Concentration: Poor  Attention Span: Poor  Recall: [employer] of Knowledge: Fair  Language: Fair   Psychomotor Activity  Psychomotor Activity: No data recorded  Musculoskeletal: Strength & Muscle Tone: within normal limits Gait & Station: normal Assets  Assets: Manufacturing Systems Engineer; Desire for Improvement    Physical Exam: Physical Exam Vitals and nursing note reviewed.    ROS Blood pressure 135/70, pulse (!) 113, temperature (!) 97.3 F (36.3 C),  resp. rate 18, height 5' 5 (1.651 m), weight 81 kg, SpO2 100%. Body mass index is 29.7 kg/m.  Diagnosis: Active Problems:   HTN (hypertension)   Hypercholesteremia   Schizoaffective disorder, bipolar type (HCC)   Bilateral lower extremity edema   Chest pain   Sinus tachycardia  Schizoaffective disorder bipolar type  Clinical Decision Making: Patient is currently admitted for worsening psychosis, manic symptoms, tangential grandiosity.  She needs inpatient hospitalization for medication management and close monitoring.  Patient is unable to make decisions on her own as she is unable to appreciate and understand her mental health history, the current need for treatment.  [facility] has screened her case and for further evaluation for legal guardianship. [facility] screened in her case. Sister and mom are involved in her care.  Treatment Plan Summary:  Safety and Monitoring:             -- inoluntary admission to inpatient psychiatric unit for safety, stabilization and treatment             -- Daily contact with patient to assess and evaluate symptoms and progress in treatment             -- Patient's case to be discussed in multi-disciplinary team meeting             -- Observation Level: q15 minute checks             -- Vital signs:  q12 hours             -- Precautions: suicide, elopement, and assault   2. Psychiatric Diagnoses and Treatment:   Discontinue Depakote  as patient is noncompliant ContGeodon 20 mg nightly as patient is agreeable to take Geodon  Clozaril  100 mg twice daily enforced with zyprexa  10mg  BID- Cymbalta  40 mg daily   -- The risks/benefits/side-effects/alternatives to this medication were discussed in detail with the patient and time was given for questions. The patient consents to medication trial.                -- Metabolic profile and EKG monitoring obtained while on an atypical antipsychotic (BMI: Lipid Panel: HbgA1c: QTc:)              -- Encouraged patient to participate in unit milieu and in scheduled group therapies                            3. Medical Issues Being Addressed:   Chest pain Sinus tachycardia HTN (hypertension) Bilateral lower extremity edema Hypercholesteremia  12/30:  Troponin negative x 2. Echocardiogram done with pending results.  Small improvement in lower extremity edema so continuing p.o. Lasix .  Will need BMP every other day.  4. Discharge Planning:   -- Social work and case management to assist with discharge planning and identification of hospital follow-up needs prior to discharge  -- Estimated LOS: 3-4 days  Desmond Chimera, MD 07/11/2024, 12:38 PM

## 2024-07-12 DIAGNOSIS — I9589 Other hypotension: Secondary | ICD-10-CM | POA: Diagnosis not present

## 2024-07-12 DIAGNOSIS — R0789 Other chest pain: Secondary | ICD-10-CM | POA: Diagnosis not present

## 2024-07-12 DIAGNOSIS — E119 Type 2 diabetes mellitus without complications: Secondary | ICD-10-CM

## 2024-07-12 DIAGNOSIS — R000 Tachycardia, unspecified: Secondary | ICD-10-CM | POA: Diagnosis not present

## 2024-07-12 DIAGNOSIS — F250 Schizoaffective disorder, bipolar type: Secondary | ICD-10-CM | POA: Diagnosis not present

## 2024-07-12 DIAGNOSIS — R600 Localized edema: Secondary | ICD-10-CM | POA: Diagnosis not present

## 2024-07-12 DIAGNOSIS — I959 Hypotension, unspecified: Principal | ICD-10-CM

## 2024-07-12 DIAGNOSIS — E7800 Pure hypercholesterolemia, unspecified: Secondary | ICD-10-CM | POA: Diagnosis not present

## 2024-07-12 LAB — BASIC METABOLIC PANEL WITH GFR
Anion gap: 8 (ref 5–15)
BUN: 10 mg/dL (ref 8–23)
CO2: 29 mmol/L (ref 22–32)
Calcium: 9.2 mg/dL (ref 8.9–10.3)
Chloride: 97 mmol/L — ABNORMAL LOW (ref 98–111)
Creatinine, Ser: 0.57 mg/dL (ref 0.44–1.00)
GFR, Estimated: >60 mL/min
Glucose, Bld: 150 mg/dL — ABNORMAL HIGH (ref 70–99)
Potassium: 4 mmol/L (ref 3.5–5.1)
Sodium: 134 mmol/L — ABNORMAL LOW (ref 135–145)

## 2024-07-12 MED ORDER — METOPROLOL SUCCINATE ER 25 MG PO TB24
25.0000 mg | ORAL_TABLET | Freq: Two times a day (BID) | ORAL | Status: AC
  Administered 2024-07-12 – 2024-08-18 (×61): 25 mg via ORAL
  Filled 2024-07-12 (×74): qty 1

## 2024-07-12 MED ORDER — FUROSEMIDE 20 MG PO TABS
40.0000 mg | ORAL_TABLET | Freq: Every day | ORAL | Status: DC
  Administered 2024-07-13: 40 mg via ORAL
  Filled 2024-07-12: qty 2

## 2024-07-12 NOTE — Progress Notes (Signed)
" °   07/11/24 2100  Psych Admission Type (Psych Patients Only)  Admission Status Voluntary  Psychosocial Assessment  Patient Complaints None  Eye Contact Fair  Facial Expression Animated  Affect Inconsistent with thought content  Speech Unremarkable  Interaction Assertive  Motor Activity Slow  Appearance/Hygiene Unremarkable  Behavior Characteristics Cooperative  Mood Pleasant  Thought Process  Coherency Tangential  Content Preoccupation  Delusions Grandeur  Perception WDL  Hallucination None reported or observed  Judgment Impaired  Confusion Mild  Danger to Self  Current suicidal ideation? Denies    "

## 2024-07-12 NOTE — Group Note (Signed)
 Date:  07/12/2024 Time:  10:45 AM  Group Topic/Focus:    For geriatric patients, combining gentle, low-impact exercises like walking, chair yoga, or Tai Chi with mindfulness and meditation (breathing, body scans) significantly boosts physical (balance, strength) and mental (less anxiety/depression, better focus) health, improving overall well-being by managing chronic pain and enhancing emotional resilience. Adaptations are key: use chairs for stability, focus on simple guided sessions, and prioritize comfort for balance, mobility, and sensory issues.  Meditation is a simple yet powerful way to support your emotional and physical well-being. Regular practice can help lower blood pressure, ease anxiety, and improve focus and sleep. From deep breathing and body scans to guided imagery and mindfulness, there are many styles to explore.  Participation Level:  Did Not Attend    [name] 07/12/2024, 10:45 AM

## 2024-07-12 NOTE — Plan of Care (Signed)
  Problem: Activity: Goal: Interest or engagement in activities will improve Outcome: Progressing   Problem: Health Behavior/Discharge Planning: Goal: Compliance with treatment plan for underlying cause of condition will improve Outcome: Progressing   Problem: Activity: Goal: Sleeping patterns will improve Outcome: Not Progressing

## 2024-07-12 NOTE — Assessment & Plan Note (Signed)
 Resolved

## 2024-07-12 NOTE — Group Note (Signed)
 Date:  07/12/2024 Time:  3:40 PM  Group Topic/Focus:  Coping With Mental Health Crisis:   The purpose of this group is to help patients identify strategies for coping with mental health crisis.  Group discusses possible causes of crisis and ways to manage them effectively.    Participation Level:  Did Not Attend    [name] 07/12/2024, 3:40 PM

## 2024-07-12 NOTE — Assessment & Plan Note (Addendum)
 Cardiac enzymes negative.  Echocardiogram showed EF of 65%.  Likely GERD.  Patient on famotidine 

## 2024-07-12 NOTE — Assessment & Plan Note (Signed)
 Heart rate improved with changing propranolol  over to Toprol-XL twice daily.

## 2024-07-12 NOTE — Plan of Care (Signed)

## 2024-07-12 NOTE — Assessment & Plan Note (Signed)
 Continue home Crestor

## 2024-07-12 NOTE — Assessment & Plan Note (Addendum)
 Patient on Geodon , trazodone , clozapine , gabapentin .

## 2024-07-12 NOTE — Progress Notes (Signed)
 Behavior:   Disruptive in milieu.  Constantly yelling and singing.      Psych assessment:  Denies SI/HI and AVH.    Interaction / Group attendance:  Hostile with staff.  Minimal interaction with peers.    Medication/ PRNs: Compliant with most morning medications.  (Refused full dose of Cymbalta  and multi-vitamin).  Refused evening medications.   Pain: Denies.  15 min checks in place for safety.

## 2024-07-12 NOTE — Assessment & Plan Note (Addendum)
 Continue Lasix  40 mg p.o. twice daily.  Continue Toprol -XL.  Unable to do compression stockings on the psychiatric floor.  Added Farxiga .  proBNP negative.

## 2024-07-12 NOTE — Progress Notes (Signed)
 Patient refused evening medications.  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

## 2024-07-12 NOTE — [facility] Group Notes (Signed)
 Spirituality Group   Description: Participant directed exploration of values, beliefs and meaning   **Group focused on setting intentions for the year ahead: What do we wish to leave behind/what do we claim going forward   Following a brief framework of chaplains role and ground rules of group behavior, participants are invited to share concerns or questions that engage spiritual life. Emphasis placed on common themes and shared experiences and ways to make meaning and clarify living into ones values.   Theory/Process/Goal: Utilize the theoretical framework of group therapy established by Celena Kite, Relational Cultural Theory and Rogerian approaches to facilitate relational empathy and use of the here and now to foster reflection, self-awareness, and sharing.   Observations: Legend was active in the group discussion. She was positive and vocal but seemed intent on gaining chaplain attention. Sometimes challenged to allow others to speak.  Phylis Javed L. Delores HERO.Div

## 2024-07-12 NOTE — Progress Notes (Signed)
" °   07/12/24 1145  Spiritual Encounters  Type of Visit Follow up  Care provided to: Patient  Referral source Patient request  Reason for visit Routine spiritual support  OnCall Visit No   I provided follow up spiritual care support to [name] at her request.  Isyss shared with me activities she has completed and enjoyed (word search and crosswords). She sang for me and generally expressed a positive outlook. Shared that she was looking forward to the new year and feeling good about herself.  This chaplain affirmed her outlook and ways she has chosen to care for herself. I affirmed her faith and offered prayer at [facility] requst. I encouraged her to join group later today.  Patria Warzecha L. Delores HERO.Div "

## 2024-07-12 NOTE — Assessment & Plan Note (Addendum)
 Change diabetes medication over to Farxiga  and get rid of metformin  and Tradjenta .  Last hemoglobin A1c 5.9

## 2024-07-12 NOTE — Progress Notes (Signed)
" °  Progress Note   Patient: Valerie [name] FMW:[id number] DOB: [birth year] DOA: 06/19/2024     23 DOS: the patient was seen and examined on 07/12/2024   Brief hospital course: Valerie [name] is a 63 y.o. female with past medical history of schizophrenia and bipolar disorder who was admitted at ger[facility]ych facility due to manic episode.  She was complaining of some intermittent chest pain, mostly involving upper retrosternal area, intermittent, no relationship with activity and no radiation.  No associated factors.  Sometimes pain increased with moving around her arms, worsening bilateral lower extremity edema, denies any shortness of breath orthopnea or PND.   Psych was also concerned about her fluctuating blood pressure going from borderline soft to little high. Psych was making some changes to her home medications.   12/30: Vitals and labs stable.  Troponin negative x 2. Echocardiogram done with pending results.  Small improvement in lower extremity edema so continuing p.o. Lasix .  Will need BMP every other day.  Assessment and Plan: * Hypotension Blood pressure low overnight.  Held Lasix  and lisinopril .  Sinus tachycardia Change propranolol  over to Toprol-XL twice daily 25 mg.  Chest pain Cardiac enzymes negative.  Echocardiogram showed EF of 65%  Bilateral lower extremity edema Held Lasix  this morning.  Unable to do compression stockings on the psychiatric floor.  Restart Lasix  tomorrow if blood pressure better.  Continue Toprol.  Hypercholesteremia Continue home Crestor   Controlled type 2 diabetes mellitus without complication, without long-term current use of insulin (HCC) Patient on linagliptin  and metformin   Schizoaffective disorder, bipolar type (HCC) Patient on Geodon , trazodone , Cymbalta , clozapine         Subjective: Patient feels okay.  No trouble breathing.  No chest pain.  Blood pressure on the lower side overnight.  Heart rate still on the faster side.  Physical  Exam: Vitals:   07/11/24 0721 07/11/24 1914 07/11/24 2130 07/12/24 1012  BP: 135/70 (!) 81/72 (!) 81/72 123/71  Pulse: (!) 113 (!) 115 (!) 118 (!) 108  Resp: 18 15  17   Temp: (!) 97.3 F (36.3 C) 99.1 F (37.3 C)    TempSrc:  Oral    SpO2: 100% 99%  100%  Weight:      Height:       Physical Exam HENT:     Head: Normocephalic.  Eyes:     General: Lids are normal.     Conjunctiva/sclera: Conjunctivae normal.  Cardiovascular:     Rate and Rhythm: Regular rhythm. Tachycardia present.     Heart sounds: Normal heart sounds, S1 normal and S2 normal.  Pulmonary:     Breath sounds: Examination of the right-lower field reveals decreased breath sounds. Examination of the left-lower field reveals decreased breath sounds. Decreased breath sounds present. No wheezing, rhonchi or rales.  Abdominal:     Palpations: Abdomen is soft.     Tenderness: There is no abdominal tenderness.  Musculoskeletal:     Right lower leg: Swelling present.     Left lower leg: Swelling present.  Skin:    General: Skin is warm.     Findings: No rash.  Neurological:     Mental Status: She is alert.     Data Reviewed: Sodium 134, glucose 150, creatinine 0.57   Disposition: As per psychiatry team  Planned Discharge Destination: As per psychiatry team    Time spent: 27 minutes  Author: Charlie Patterson, MD 07/12/2024 2:56 PM  For on call review [email or web address].  "

## 2024-07-13 DIAGNOSIS — R600 Localized edema: Secondary | ICD-10-CM | POA: Diagnosis not present

## 2024-07-13 DIAGNOSIS — I9589 Other hypotension: Secondary | ICD-10-CM | POA: Diagnosis not present

## 2024-07-13 DIAGNOSIS — E871 Hypo-osmolality and hyponatremia: Secondary | ICD-10-CM

## 2024-07-13 DIAGNOSIS — R000 Tachycardia, unspecified: Secondary | ICD-10-CM | POA: Diagnosis not present

## 2024-07-13 DIAGNOSIS — R079 Chest pain, unspecified: Secondary | ICD-10-CM | POA: Diagnosis not present

## 2024-07-13 DIAGNOSIS — F250 Schizoaffective disorder, bipolar type: Secondary | ICD-10-CM | POA: Diagnosis not present

## 2024-07-13 DIAGNOSIS — E119 Type 2 diabetes mellitus without complications: Secondary | ICD-10-CM | POA: Diagnosis not present

## 2024-07-13 DIAGNOSIS — E7800 Pure hypercholesterolemia, unspecified: Secondary | ICD-10-CM | POA: Diagnosis not present

## 2024-07-13 LAB — CBC WITH DIFFERENTIAL/PLATELET
Abs Immature Granulocytes: 0.02 K/uL (ref 0.00–0.07)
Basophils Absolute: 0.1 K/uL (ref 0.0–0.1)
Basophils Relative: 1 %
Eosinophils Absolute: 0.3 K/uL (ref 0.0–0.5)
Eosinophils Relative: 5 %
HCT: 34.7 % — ABNORMAL LOW (ref 36.0–46.0)
Hemoglobin: 11.2 g/dL — ABNORMAL LOW (ref 12.0–15.0)
Immature Granulocytes: 0 %
Lymphocytes Relative: 36 %
Lymphs Abs: 2.5 K/uL (ref 0.7–4.0)
MCH: 27.8 pg (ref 26.0–34.0)
MCHC: 32.3 g/dL (ref 30.0–36.0)
MCV: 86.1 fL (ref 80.0–100.0)
Monocytes Absolute: 0.6 K/uL (ref 0.1–1.0)
Monocytes Relative: 9 %
Neutro Abs: 3.4 K/uL (ref 1.7–7.7)
Neutrophils Relative %: 49 %
Platelets: 329 K/uL (ref 150–400)
RBC: 4.03 MIL/uL (ref 3.87–5.11)
RDW: 13.3 % (ref 11.5–15.5)
WBC: 6.9 K/uL (ref 4.0–10.5)
nRBC: 0 % (ref 0.0–0.2)

## 2024-07-13 LAB — BASIC METABOLIC PANEL WITH GFR
Anion gap: 8 (ref 5–15)
BUN: 11 mg/dL (ref 8–23)
CO2: 27 mmol/L (ref 22–32)
Calcium: 9 mg/dL (ref 8.9–10.3)
Chloride: 95 mmol/L — ABNORMAL LOW (ref 98–111)
Creatinine, Ser: 0.47 mg/dL (ref 0.44–1.00)
GFR, Estimated: >60 mL/min
Glucose, Bld: 109 mg/dL — ABNORMAL HIGH (ref 70–99)
Potassium: 3.8 mmol/L (ref 3.5–5.1)
Sodium: 130 mmol/L — ABNORMAL LOW (ref 135–145)

## 2024-07-13 MED ORDER — FUROSEMIDE 20 MG PO TABS
40.0000 mg | ORAL_TABLET | ORAL | Status: AC
  Administered 2024-07-13 – 2024-08-18 (×73): 40 mg via ORAL
  Filled 2024-07-13 (×73): qty 2

## 2024-07-13 NOTE — Progress Notes (Signed)
 D- Patient alert and oriented x 2. Pt presents with a pleasant mood and affect. Pt engaging with clinical research associate in conversation and joking. Pt singing.  Pt states she slept 4.5 hours per report and pt.   A- Scheduled medications administered to patient, per MD orders. Support and encouragement provided.  Routine safety checks conducted every 15 minutes.  Patient informed to notify staff with problems or concerns.  R- No adverse drug reactions noted. Patient contracts for safety at this time. Patient compliant with medications and treatment plan. Patient receptive, calm, and cooperative. Patient interacts well with others on the unit.  Patient remains safe at this time.    Aladdin Kollmann S.,RN

## 2024-07-13 NOTE — Progress Notes (Signed)
 " Progress Note   Patient: Valerie [name] FMW:[id number] DOB: May 27, 1961 DOA: 06/19/2024     24 DOS: the patient was seen and examined on 07/13/2024   Brief hospital course: Valerie [name] is a 64 y.o. female with past medical history of schizophrenia and bipolar disorder who was admitted at geropsych facility due to manic episode.  She was complaining of some intermittent chest pain, mostly involving upper retrosternal area, intermittent, no relationship with activity and no radiation.  No associated factors.  Sometimes pain increased with moving around her arms, worsening bilateral lower extremity edema, denies any shortness of breath orthopnea or PND.   Psych was also concerned about her fluctuating blood pressure going from borderline soft to little high. Psych was making some changes to her home medications.   12/30: Vitals and labs stable.  Troponin negative x 2. Echocardiogram done with pending results.  Small improvement in lower extremity edema so continuing p.o. Lasix .  Will need BMP every other day. 12/31.  Held Lasix  and lisinopril  secondary to low blood pressure.  Switched propranolol  over to Toprol-XL twice daily. 07/13/2024.  Blood pressure improved.  Will increase Lasix  to 40 mg orally twice daily for now.  Continue Toprol XL.  Assessment and Plan: * Sinus tachycardia Heart rate improved with changing propranolol  over to Toprol-XL twice daily.  Bilateral lower extremity edema With blood pressure and heart rate better controlled I will increase Lasix  to 40 mg twice daily.  Continue Toprol-XL.  Unable to do compression stockings on the psychiatric floor.   Hypotension Resolved.  Chest pain Cardiac enzymes negative.  Echocardiogram showed EF of 65%  Hypercholesteremia Continue home Crestor   Hyponatremia Sodium a few points less than the normal range.  Controlled type 2 diabetes mellitus without complication, without long-term current use of insulin (HCC) Patient on  linagliptin  and metformin   Schizoaffective disorder, bipolar type (HCC) Patient on Geodon , trazodone , Cymbalta , clozapine         Subjective: Patient feeling okay.  Heart rates better controlled.  Blood pressure stable.  Patient feels okay.  Still having swelling of her lower extremities.  Physical Exam: Vitals:   07/12/24 1012 07/12/24 1925 07/12/24 2200 07/13/24 1029  BP: 123/71 136/76 136/76 133/61  Pulse: (!) 108 92 92 75  Resp: 17 18    Temp:  (!) 97.2 F (36.2 C)    TempSrc:      SpO2: 100% 100%  100%  Weight:      Height:       Physical Exam HENT:     Head: Normocephalic.  Eyes:     General: Lids are normal.     Conjunctiva/sclera: Conjunctivae normal.  Cardiovascular:     Rate and Rhythm: Normal rate and regular rhythm.     Heart sounds: Normal heart sounds, S1 normal and S2 normal.  Pulmonary:     Breath sounds: Examination of the right-lower field reveals decreased breath sounds. Examination of the left-lower field reveals decreased breath sounds. Decreased breath sounds present. No wheezing, rhonchi or rales.  Abdominal:     Palpations: Abdomen is soft.     Tenderness: There is no abdominal tenderness.  Musculoskeletal:     Right lower leg: Swelling present.     Left lower leg: Swelling present.  Skin:    General: Skin is warm.     Findings: No rash.  Neurological:     Mental Status: She is alert.     Data Reviewed: Sodium 138, creatinine 0.47, potassium 3.8, white blood cell count 6.9,  hemoglobin 11.2, platelet count 329  Family Communication: As per psychiatry team  Disposition: As per psychiatry team  Planned Discharge Destination: To be determined    Time spent: 28 minutes  Author: Charlie Patterson, MD 07/13/2024 2:36 PM  For on call review [email or web address].  "

## 2024-07-13 NOTE — Group Note (Signed)
 Date:  07/13/2024 Time:  8:35 PM  Group Topic/Focus:  Wrap-Up Group:   The focus of this group is to help patients review their daily goal of treatment and discuss progress on daily workbooks.    Participation Level:  None  Haris Baack T Zulema 07/13/2024, 8:35 PM

## 2024-07-13 NOTE — Group Note (Signed)
 Date:  07/13/2024 Time:  3:45 PM  Group Topic/Focus:  Goals Group:   The focus of this group is to help patients establish daily goals to achieve during treatment and discuss how the patient can incorporate goal setting into their daily lives to aide in recovery.    Participation Level:  Active  Participation Quality:  Appropriate  Affect:  Appropriate  Cognitive:  Appropriate  Insight: Appropriate  Engagement in Group:  Engaged  Modes of Intervention:  Discussion   Valerie [name] 07/13/2024, 3:45 PM

## 2024-07-13 NOTE — Group Note (Signed)
 Recreation Therapy Group Note   Group Topic:Goal Setting  Group Date: 07/13/2024 Start Time: 1330 End Time: 1405 Facilitators: Celestia Jeoffrey BRAVO, LRT, CTRS Location: Dayroom  Group Description: New Years Resolutions. Patients received a handout for them to write down their new years resolutions on it. The handout had a decorative border around the paper that patients could also color once they're finished writing their new year resolutions down. Afterwards, LRT encouraged patients to read out loud their new years resolutions to the group.   Goal Area(s) Addressed: Patient will increase communication,  Patient will identify a goal leading into the new year.  Patient will foster mutual support. Patient will express themselves through art.     Affect/Mood: N/A   Participation Level: Did not attend    Clinical Observations/Individualized Feedback: Patient did not attend.   Plan: Continue to engage patient in RT group sessions 2-3x/week.   Jeoffrey BRAVO Celestia, LRT, CTRS 07/13/2024 4:29 PM

## 2024-07-13 NOTE — Plan of Care (Signed)
" °  Problem: Education: Goal: Ability to state activities that reduce stress will improve Outcome: Progressing   Problem: Self-Concept: Goal: Ability to identify factors that promote anxiety will improve Outcome: Progressing Goal: Level of anxiety will decrease Outcome: Progressing Goal: Ability to modify response to factors that promote anxiety will improve Outcome: Progressing   Problem: Activity: Goal: Will verbalize the importance of balancing activity with adequate rest periods Outcome: Progressing   "

## 2024-07-13 NOTE — Plan of Care (Signed)
   Problem: Education: Goal: Emotional status will improve Outcome: Progressing Goal: Mental status will improve Outcome: Progressing   Problem: Activity: Goal: Sleeping patterns will improve Outcome: Progressing

## 2024-07-13 NOTE — Progress Notes (Signed)
" °   07/13/24 2200  Psych Admission Type (Psych Patients Only)  Admission Status Involuntary  Psychosocial Assessment  Patient Complaints Anxiety  Eye Contact Intense  Facial Expression Other (Comment) (changes between angry, animated, flat, and anxious)  Affect Labile  Speech Pressured  Interaction Assertive  Motor Activity Slow  Appearance/Hygiene Unremarkable  Behavior Characteristics Cooperative;Anxious  Mood Labile;Euphoric  Thought Process  Coherency Tangential  Content Preoccupation  Delusions Grandeur  Perception WDL  Hallucination None reported or observed  Judgment Impaired  Confusion Mild  Danger to Self  Current suicidal ideation? Denies  Agreement Not to Harm Self Yes  Description of Agreement Verbal  Danger to Others  Danger to Others None reported or observed    "

## 2024-07-13 NOTE — Group Note (Signed)
 Date:  07/13/2024 Time:  11:07 AM  Group Topic/Focus:  Making Healthy Choices:   The focus of this group is to help patients identify negative/unhealthy choices they were using prior to admission and identify positive/healthier coping strategies to replace them upon discharge.    Participation Level:  Did Not Attend  Maglione,Kyanne Rials E 07/13/2024, 11:07 AM

## 2024-07-13 NOTE — Progress Notes (Addendum)
 SI/HI:denies Behavior/Mood: Pt is irritable. Talkative. Delusional and grandiose. States she attended school with ozell mace and other entertainers. Pt was redirected and reoriented to reality.  Interaction/Group: visible in the dayroom playing cards, independently. Partial participation in group.  Medication/PRNs:po med compliant/No PRNs given. Pain:denies Other:slept 4.5 hours   07/12/24 2100  Psych Admission Type (Psych Patients Only)  Admission Status Voluntary  Psychosocial Assessment  Patient Complaints Irritability  Eye Contact Intense  Facial Expression Angry  Affect Labile  Speech Pressured  Interaction Demanding  Motor Activity Slow  Appearance/Hygiene Disheveled  Behavior Characteristics Resistant to care  Mood Irritable  Thought Process  Coherency Tangential  Content Preoccupation  Delusions Grandeur  Perception WDL  Hallucination None reported or observed  Judgment Impaired  Confusion Mild  Danger to Self  Current suicidal ideation? Denies

## 2024-07-13 NOTE — Assessment & Plan Note (Signed)
Sodium a few points less than the normal range. 

## 2024-07-13 NOTE — Group Note (Signed)
 [facility] LCSW Group Therapy Note   Group Date: 07/13/2024 Start Time: 1300 End Time: 1330  Type of Therapy and Topic:  Group Therapy:  Feelings around Relapse and Recovery  Participation Level:  Active   Description of Group:    Patients in this group will discuss emotions they experience before and after a relapse. They will process how experiencing these feelings, or avoidance of experiencing them, relates to having a relapse. Facilitator will guide patients to explore emotions they have related to recovery. Patients will be encouraged to process which emotions are more powerful. They will be guided to discuss the emotional reaction significant others in their lives may have to patients relapse or recovery. Patients will be assisted in exploring ways to respond to the emotions of others without this contributing to a relapse.  Therapeutic Goals: Patient will identify two or more emotions that lead to relapse for them:  Patient will identify two emotions that result when they relapse:  Patient will identify two emotions related to recovery:  Patient will demonstrate ability to communicate their needs through discussion and/or role plays.   Summary of Patient Progress: Patient was present for the majority of the group. She participated in the activity and then left the room. Pt did not participate in the larger discussion. She appeared to lack insight into the topic and herself.   Therapeutic Modalities:   Cognitive Behavioral Therapy Solution-Focused Therapy Assertiveness Training Relapse Prevention Therapy   Nadara JONELLE Fam, LCSW

## 2024-07-14 ENCOUNTER — Other Ambulatory Visit (HOSPITAL_COMMUNITY): Payer: Self-pay

## 2024-07-14 ENCOUNTER — Telehealth (HOSPITAL_COMMUNITY): Payer: Self-pay | Admitting: Pharmacy Technician

## 2024-07-14 DIAGNOSIS — F250 Schizoaffective disorder, bipolar type: Secondary | ICD-10-CM | POA: Diagnosis not present

## 2024-07-14 DIAGNOSIS — R000 Tachycardia, unspecified: Secondary | ICD-10-CM | POA: Diagnosis not present

## 2024-07-14 DIAGNOSIS — E119 Type 2 diabetes mellitus without complications: Secondary | ICD-10-CM | POA: Diagnosis not present

## 2024-07-14 DIAGNOSIS — I9589 Other hypotension: Secondary | ICD-10-CM | POA: Diagnosis not present

## 2024-07-14 DIAGNOSIS — R600 Localized edema: Secondary | ICD-10-CM | POA: Diagnosis not present

## 2024-07-14 DIAGNOSIS — E871 Hypo-osmolality and hyponatremia: Secondary | ICD-10-CM | POA: Diagnosis not present

## 2024-07-14 DIAGNOSIS — E7800 Pure hypercholesterolemia, unspecified: Secondary | ICD-10-CM | POA: Diagnosis not present

## 2024-07-14 MED ORDER — DAPAGLIFLOZIN PROPANEDIOL 5 MG PO TABS
10.0000 mg | ORAL_TABLET | Freq: Every day | ORAL | Status: AC
  Administered 2024-07-15 – 2024-08-18 (×35): 10 mg via ORAL
  Filled 2024-07-14 (×7): qty 1
  Filled 2024-07-14: qty 2
  Filled 2024-07-14 (×6): qty 1
  Filled 2024-07-14: qty 2
  Filled 2024-07-14 (×21): qty 1

## 2024-07-14 NOTE — Telephone Encounter (Signed)
 Patient Product/process Development Scientist completed.    The patient is insured through [place]. Patient has Medicare and is not eligible for a copay card, but may be able to apply for patient assistance or Medicare RX Payment Plan (Patient Must reach out to their plan, if eligible for payment plan), if available.    Ran test claim for Farxiga 10 mg and the current 30 day co-pay is $12.65.  Ran test claim for Jardiance 10 mg and the current 30 day co-pay is $12.65.   This test claim was processed through [facility]- copay amounts may vary at other pharmacies due to pharmacy/plan contracts, or as the patient moves through the different stages of their insurance plan.     Reyes Sharps, CPHT Pharmacy Technician Patient Advocate Specialist Lead [facility] Patient Advocate Team Direct Number: [phone]  Fax: [phone]

## 2024-07-14 NOTE — Group Note (Signed)
 Date:  07/14/2024 Time:  8:32 PM  Group Topic/Focus:  Healthy Communication:   The focus of this group is to discuss communication, barriers to communication, as well as healthy ways to communicate with others.    Participation Level:  None  Participation Quality:  Resistant  Affect:  Flat  Cognitive:  Lacking  Insight: None  Engagement in Group:  Resistant  Modes of Intervention:  Limit-setting  Additional Comments:    Romero Earnie Hope 07/14/2024, 8:32 PM

## 2024-07-14 NOTE — Plan of Care (Signed)
   Problem: Education: Goal: Emotional status will improve Outcome: Progressing Goal: Mental status will improve Outcome: Progressing

## 2024-07-14 NOTE — Progress Notes (Signed)
 [facility] MD Progress Note   9:23 AM Valerie [name]  MRN:  [id number]   Valerie [name] is a 64 year old female presenting voluntary to [facility] requesting a psychiatric evaluation for worsening psychosis. Patient has history of schizophrenia. Patient denied SI, HI, psychosis and alcohol/drug usage. Patient resides at [facility]. Patient reports people are coming by my door talking loud, yelling and screaming and coming in my room with keys. Patient reports having multiple medical Afibs and blood clots due to this. Patient reports people are doing things in the dining room. When asked if she called EMS, patient stated I called EMS, I stripped searched over the phone and called the authorities. Patient is admitted to [facility] unit with Q15 min safety monitoring. Multidisciplinary team approach is offered. Medication management; group/milieu therapy is offered.   Sister Bonny Vanleeuwen : [phone] called on 07/07/24 went to generic voicemail-sister called back and provider discussed at length the current treatment plan of patient being on Clozaril -intermittently refusing, enforced with Zyprexa  IM and recent and addition of Geodon .  Due to patient's ongoing poor response to medications and ongoing delusions discussed the plan of switching Clozaril  to another antipsychotic that comes in LAI form.  Clozaril  may not be the best option for patients who are noncompliant and refusing labs.  Sister is agreeable with the plan  Subjective:  Chart reviewed, case discussed in multidisciplinary meeting, patient seen during rounds.  Shown some improvement in her mentation and she is able to engage participate in some meetings but still believes that people are poisoning her meds and not giving her more medication she reported to me that they gave her positively Geodon  and combining medications but there has been some improvement where her delusional thought process is not that intense as when  she came in she denies any current suicidal homicidal thoughts she does not have any insight  Past Psychiatric History: see h&P Family History:  Family History  Problem Relation Age of Onset   Colon cancer Neg Hx    Celiac disease Neg Hx    Inflammatory bowel disease Neg Hx    Social History:  Social History   Substance and Sexual Activity  Alcohol Use Never     Social History   Substance and Sexual Activity  Drug Use Never    Social History   Socioeconomic History   Marital status: Single    Spouse name: Not on file   Number of children: Not on file   Years of education: Not on file   Highest education level: Not on file  Occupational History   Not on file  Tobacco Use   Smoking status: Never   Smokeless tobacco: Never  Substance and Sexual Activity   Alcohol use: Never   Drug use: Never   Sexual activity: Not Currently    Birth control/protection: Pill  Other Topics Concern   Not on file  Social History Narrative   Not on file   Social Drivers of Health   Tobacco Use: Low Risk (06/19/2024)   Patient History    Smoking Tobacco Use: Never    Smokeless Tobacco Use: Never    Passive Exposure: Not on file  Financial Resource Strain: Not on file  Food Insecurity: Unknown (06/19/2024)   Epic    Worried About Programme Researcher, Broadcasting/film/video in the Last Year: Never true    Ran Out of Food in the Last Year: Patient declined  Transportation Needs: No Transportation Needs (06/19/2024)  Epic    Lack of Transportation (Medical): No    Lack of Transportation (Non-Medical): No  Physical Activity: Not on file  Stress: Not on file  Social Connections: Not on file  Depression (PHQ2-9): Low Risk (12/21/2022)   Depression (PHQ2-9)    PHQ-2 Score: 0  Alcohol Screen: Low Risk (06/19/2024)   Alcohol Screen    Last Alcohol Screening Score (AUDIT): 0  Housing: Low Risk (06/19/2024)   Epic    Unable to Pay for Housing in the Last Year: No    Number of Times Moved in the Last Year: 0     Homeless in the Last Year: No  Utilities: Not At Risk (06/19/2024)   Epic    Threatened with loss of utilities: No  Health Literacy: Not on file   Past Medical History:  Past Medical History:  Diagnosis Date   Chronic lower extremity pain (2ry area of Pain) (Right) 12/21/2022   Chronic pain syndrome 12/20/2022   DDD (degenerative disc disease), 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 Tobie: Pancolonic diverticulosis, tortuous colon, next colonoscopy 2024   TONSILLECTOMY      Current Medications: Current Facility-Administered Medications  Medication Dose Route Frequency Provider Last Rate Last Admin   acetaminophen  (TYLENOL ) tablet 650 mg  650 mg Oral Q6H PRN Onuoha, Chinwendu V, NP   650 mg at 07/11/24 1835   aspirin  EC tablet 81 mg  81 mg Oral Daily Bobbitt, Shalon E, NP   81 mg at 07/13/24 1028   bisacodyl  (DULCOLAX) EC tablet 10 mg  10 mg Oral Daily PRN Jadapalle, Sree, MD       cholecalciferol  (VITAMIN D3) 25 MCG (1000 UNIT) tablet 1,000 Units  1,000 Units Oral Weekly Jadapalle, Sree, MD   1,000 Units at 07/04/24 1201   cloZAPine  (CLOZARIL ) tablet 100 mg  100 mg Oral BID Jadapalle, Sree, MD   100 mg at 07/13/24 2207   Or   OLANZapine  (ZYPREXA ) injection 10 mg  10 mg Intramuscular BID Jadapalle, Sree, MD   10 mg at 07/05/24 2233   diclofenac  Sodium (VOLTAREN ) 1 % topical gel 4 g  4 g Topical QID Donnelly Mellow, MD   4 g at 07/12/24 2222   DULoxetine  (CYMBALTA ) DR capsule 40 mg  40 mg Oral Daily Jadapalle, Sree, MD   40 mg at 07/13/24 1028   famotidine  (PEPCID ) tablet 40 mg  40 mg Oral Daily Shrivastava, Aryendra, MD   40 mg at 07/13/24 1027   fluticasone  (FLONASE ) 50 MCG/ACT nasal spray 1 spray  1 spray Each Nare Daily Bobbitt, Shalon E, NP       furosemide  (LASIX ) tablet 40 mg  40 mg Oral 2 times per day Josette Ade, MD   40 mg  at 07/14/24 0759   gabapentin  (NEURONTIN ) capsule 300 mg  300 mg Oral TID Bobbitt, Shalon E, NP   300 mg at 07/13/24 2207   hydrOXYzine  (ATARAX ) tablet 25 mg  25 mg Oral TID PRN Bobbitt, Shalon E, NP   25 mg at 07/11/24 1805   linagliptin  (TRADJENTA ) tablet 5 mg  5 mg Oral Daily Bobbitt, Shalon E, NP   5 mg at 07/13/24 1028   magnesium  hydroxide (MILK OF MAGNESIA) suspension 30 mL  30 mL Oral Daily PRN Bobbitt, Shalon E, NP  metFORMIN  (GLUCOPHAGE ) tablet 1,000 mg  1,000 mg Oral Q supper Bobbitt, Shalon E, NP   1,000 mg at 07/13/24 1759   metoprolol succinate (TOPROL-XL) 24 hr tablet 25 mg  25 mg Oral BID Josette Ade, MD   25 mg at 07/13/24 2214   multivitamin with minerals tablet 1 tablet  1 tablet Oral Daily Bobbitt, Shalon E, NP   1 tablet at 07/13/24 1027   naproxen  (NAPROSYN ) tablet 375 mg  375 mg Oral TID WC Jadapalle, Sree, MD   375 mg at 07/14/24 0759   OLANZapine  (ZYPREXA ) injection 5 mg  5 mg Intramuscular TID PRN Bobbitt, Shalon E, NP   5 mg at 06/22/24 2259   OLANZapine  zydis (ZYPREXA ) disintegrating tablet 5 mg  5 mg Oral TID PRN Bobbitt, Shalon E, NP   5 mg at 07/11/24 1835   ondansetron  (ZOFRAN ) tablet 4 mg  4 mg Oral Q8H PRN Bobbitt, Shalon E, NP   4 mg at 07/11/24 1835   potassium chloride  SA (KLOR-CON  M) CR tablet 20 mEq  20 mEq Oral BID Bobbitt, Shalon E, NP   20 mEq at 07/13/24 2207   rosuvastatin  (CRESTOR ) tablet 5 mg  5 mg Oral Daily Bobbitt, Shalon E, NP   5 mg at 07/13/24 1028   sodium chloride  0.9 % bolus 500 mL  500 mL Intravenous Once Shrivastava, Aryendra, MD       traZODone  (DESYREL ) tablet 50 mg  50 mg Oral QHS PRN Bobbitt, Shalon E, NP       ziprasidone  (GEODON ) capsule 20 mg  20 mg Oral QHS Jadapalle, Sree, MD   20 mg at 07/13/24 2208   zolpidem  (AMBIEN ) tablet 10 mg  10 mg Oral QHS Jadapalle, Sree, MD   10 mg at 07/13/24 2207    Lab Results:  Results for orders placed or performed during the hospital encounter of 06/19/24 (from the past 48 hours)  Basic  metabolic panel     Status: Abnormal   Collection Time: 07/13/24  6:55 AM  Result Value Ref Range   Sodium 130 (L) 135 - 145 mmol/L   Potassium 3.8 3.5 - 5.1 mmol/L   Chloride 95 (L) 98 - 111 mmol/L   CO2 27 22 - 32 mmol/L   Glucose, Bld 109 (H) 70 - 99 mg/dL    Comment: Glucose reference range applies only to samples taken after fasting for at least 8 hours.   BUN 11 8 - 23 mg/dL   Creatinine, Ser 9.52 0.44 - 1.00 mg/dL   Calcium  9.0 8.9 - 10.3 mg/dL   GFR, Estimated >39 >39 mL/min    Comment: (NOTE) Calculated using the CKD-EPI Creatinine Equation (2021)    Anion gap 8 5 - 15    Comment: Performed at [facility], [address] [place], [place] [address]  CBC with Differential/Platelet     Status: Abnormal   Collection Time: 07/13/24  6:55 AM  Result Value Ref Range   WBC 6.9 4.0 - 10.5 K/uL   RBC 4.03 3.87 - 5.11 MIL/uL   Hemoglobin 11.2 (L) 12.0 - 15.0 g/dL   HCT 65.2 (L) 63.9 - 53.9 %   MCV 86.1 80.0 - 100.0 fL   MCH 27.8 26.0 - 34.0 pg   MCHC 32.3 30.0 - 36.0 g/dL   RDW 86.6 88.4 - 84.4 %   Platelets 329 150 - 400 K/uL   nRBC 0.0 0.0 - 0.2 %   Neutrophils Relative % 49 %   Neutro Abs 3.4  1.7 - 7.7 K/uL   Lymphocytes Relative 36 %   Lymphs Abs 2.5 0.7 - 4.0 K/uL   Monocytes Relative 9 %   Monocytes Absolute 0.6 0.1 - 1.0 K/uL   Eosinophils Relative 5 %   Eosinophils Absolute 0.3 0.0 - 0.5 K/uL   Basophils Relative 1 %   Basophils Absolute 0.1 0.0 - 0.1 K/uL   Immature Granulocytes 0 %   Abs Immature Granulocytes 0.02 0.00 - 0.07 K/uL    Comment: Performed at [facility], [address] [place], [place] [address]        Blood Alcohol level:  Lab Results  Component Value Date   [facility] <15 06/18/2024    Metabolic Disorder Labs: Lab Results  Component Value Date   HGBA1C 5.9 (H) 06/28/2024   MPG 122.63 06/28/2024   No results found for: PROLACTIN Lab Results  Component Value Date   CHOL 156 06/28/2024   TRIG 89 06/28/2024    HDL 70 06/28/2024   CHOLHDL 2.2 06/28/2024   VLDL 18 06/28/2024   LDLCALC 69 06/28/2024    Physical Findings: AIMS:  , ,  ,  ,    CIWA:    COWS:      Psychiatric Specialty Exam:  Presentation  General Appearance:  Casual  Eye Contact: Fleeting  Speech: Normal Rate  Speech Volume: Increased    Mood and Affect  Mood: Euphoric  Affect: Labile   Thought Process  Thought Processes: Disorganized  Orientation:Full (Time, Place and Person)  Thought Content:Illogical; Delusions; Tangential  Hallucinations: Denies  Ideas of Reference:Delusions  Suicidal Thoughts: Denies  Homicidal Thoughts: Denies   Sensorium  Memory: Immediate Fair; Remote Fair  Judgment: Impaired  Insight: Shallow   Executive Functions  Concentration: Poor  Attention Span: Poor  Recall: [employer] of Knowledge: Fair  Language: Fair   Psychomotor Activity  Psychomotor Activity: No data recorded  Musculoskeletal: Strength & Muscle Tone: within normal limits Gait & Station: normal Assets  Assets: Manufacturing Systems Engineer; Desire for Improvement    Physical Exam: Physical Exam Vitals and nursing note reviewed.    ROS Blood pressure (!) 116/48, pulse 99, temperature (!) 97.2 F (36.2 C), resp. rate 19, height 5' 5 (1.651 m), weight 81 kg, SpO2 100%. Body mass index is 29.7 kg/m.  Diagnosis: Principal Problem:   Sinus tachycardia Active Problems:   Hypercholesteremia   Schizoaffective disorder, bipolar type (HCC)   Bilateral lower extremity edema   Chest pain   Hypotension   Controlled type 2 diabetes mellitus without complication, without long-term current use of insulin (HCC)   Hyponatremia  Schizoaffective disorder bipolar type  Clinical Decision Making: Patient is currently admitted for worsening psychosis, manic symptoms, tangential grandiosity.  She needs inpatient hospitalization for medication management and close monitoring.  Patient is  unable to make decisions on her own as she is unable to appreciate and understand her mental health history, the current need for treatment.  [facility] has screened her case and for further evaluation for legal guardianship. [facility] screened in her case. Sister and mom are involved in her care.  Treatment Plan Summary:  Safety and Monitoring:             -- inoluntary admission to inpatient psychiatric unit for safety, stabilization and treatment             -- Daily contact with patient to assess and evaluate symptoms and progress in treatment             --  Patient's case to be discussed in multi-disciplinary team meeting             -- Observation Level: q15 minute checks             -- Vital signs:  q12 hours             -- Precautions: suicide, elopement, and assault   2. Psychiatric Diagnoses and Treatment:  Discontinue Depakote  as patient is noncompliant ContGeodon 20 mg nightly as patient is agreeable to take Geodon  Clozaril  100 mg twice daily enforced with zyprexa  10mg  BID- Cymbalta  40 mg daily   -- The risks/benefits/side-effects/alternatives to this medication were discussed in detail with the patient and time was given for questions. The patient consents to medication trial.                -- Metabolic profile and EKG monitoring obtained while on an atypical antipsychotic (BMI: Lipid Panel: HbgA1c: QTc:)              -- Encouraged patient to participate in unit milieu and in scheduled group therapies                            3. Medical Issues Being Addressed:   Chest pain Sinus tachycardia HTN (hypertension) Bilateral lower extremity edema Hypercholesteremia  12/30:  Troponin negative x 2. Echocardiogram done with pending results.  Small improvement in lower extremity edema so continuing p.o. Lasix .  Will need BMP every other day.  4. Discharge Planning:   -- Social work and case management to assist with discharge planning and identification of hospital follow-up needs prior  to discharge  -- Estimated LOS: 3-4 days  Millie JONELLE Manners, MD 07/14/2024, 9:23 AM

## 2024-07-14 NOTE — Group Note (Signed)
 Physical/Occupational Therapy Group Note  Group Topic: Neurographic Art  Group Date: 07/14/2024 Start Time: 1305 End Time: 1355 Facilitators: Clive Warren CROME, OT   Group Description: Group participated with Neurographic art activity, using watercolor paints to facilitate creative expression and meditation/relaxation for each individual.  Incorporated bimanual coordination, mental focus, emotional processing, task/command following and relaxation techniques as appropriate.  Patients engaged socially with therapist and other group participants throughout session. Allowed to ask questions as appropriate, and encouraged to identify ways they could use/share their creations with themselves and others.  Therapeutic Goal(s):  Demonstrate ability to independently manipulate utensils required to participate with and complete activity. Demonstrate ability to cognitively focus on task and follow commands necessary for completion. Demonstrate use of art as an outlet for emotional processing and expression. Identify and demonstrate importance of relaxation, neural calming and meditation for improved participation with life groups.  Individual Participation: Pt did not attend.  Participation Level: Did not attend   Participation Quality:   Behavior:   Speech/Thought Process:   Affect/Mood:   Insight:   Judgement:   Modes of Intervention:   Patient Response to Interventions:    Plan: Continue to engage patient in PT/OT groups 1 - 2x/week.  Lindley Stachnik R., MPH, MS, OTR/L ascom [phone] 07/14/2024, 3:54 PM

## 2024-07-14 NOTE — Progress Notes (Signed)
(  Sleep Hours) - 7  (Any PRNs that were needed, meds refused, or side effects to meds)- voltaren  4g topical refused, no prns, no side effects to meds  (Any disturbances and when (visitation, over night)- n/a  (Concerns raised by the patient)- n/a  (SI/HI/AVH)- denies

## 2024-07-14 NOTE — Group Note (Signed)
 Recreation Therapy Group Note   Group Topic:Emotion Expression  Group Date: 07/14/2024 Start Time: 1400 End Time: 1430 Facilitators: Celestia Jeoffrey BRAVO, LRT, CTRS Location: Courtyard  Group Description: Music. Patients are encouraged to name their favorite song(s) for LRT to play song through speaker for group to hear, while in the courtyard getting fresh air and sunlight. Patients educated on the definition of leisure and the importance of having different leisure interests outside of the hospital. Group discussed how leisure activities can often be used as pharmacologist and that listening to music and being outside are examples.    Goal Area(s) Addressed:  Patient will identify a current leisure interest.  Patient will practice making a positive decision. Patient will have the opportunity to try a new leisure activity.   Affect/Mood: N/A   Participation Level: Did not attend    Clinical Observations/Individualized Feedback: Patient did not attend.  Plan: Continue to engage patient in RT group sessions 2-3x/week.   Jeoffrey BRAVO Celestia, LRT, CTRS 07/14/2024 4:57 PM

## 2024-07-14 NOTE — Progress Notes (Signed)
 " Progress Note   Patient: Valerie [name] FMW:[id number] DOB: [birth year] DOA: 06/19/2024     25 DOS: the patient was seen and examined on 07/14/2024   Brief hospital course: Valerie [name] is a 64 y.o. female with past medical history of schizophrenia and bipolar disorder who was admitted at geropsych facility due to manic episode.  She was complaining of some intermittent chest pain, mostly involving upper retrosternal area, intermittent, no relationship with activity and no radiation.  No associated factors.  Sometimes pain increased with moving around her arms, worsening bilateral lower extremity edema, denies any shortness of breath orthopnea or PND.   Psych was also concerned about her fluctuating blood pressure going from borderline soft to little high. Psych was making some changes to her home medications.   12/30: Vitals and labs stable.  Troponin negative x 2. Echocardiogram done with pending results.  Small improvement in lower extremity edema so continuing p.o. Lasix .  Will need BMP every other day. 12/31.  Held Lasix  and lisinopril  secondary to low blood pressure.  Switched propranolol  over to Toprol-XL twice daily. 07/13/2024.  Blood pressure improved.  Will increase Lasix  to 40 mg orally twice daily for now.  Continue Toprol XL.  Assessment and Plan: * Bilateral lower extremity edema Continue Lasix  40 mg p.o. twice daily.  Continue Toprol-XL.  Unable to do compression stockings on the psychiatric floor.  Will switch Tradjenta  over to Farxiga tomorrow.  Will check proBNP tomorrow with labs.  Sinus tachycardia Heart rate improved with changing propranolol  over to Toprol-XL twice daily.  Hypotension Resolved.  Chest pain Cardiac enzymes negative.  Echocardiogram showed EF of 65%.  Likely GERD.  Hypercholesteremia Continue home Crestor   Hyponatremia Sodium a few points less than the normal range.  Controlled type 2 diabetes mellitus without complication, without long-term  current use of insulin (HCC) Patient on linagliptin  and metformin   Schizoaffective disorder, bipolar type (HCC) Patient on Geodon , trazodone , Cymbalta , clozapine , gabapentin         Subjective: Patient feels okay.  Urinated well yesterday.  Appetite OT.  On the psychiatry floor for 25 days for worsening psychosis.  Physical Exam: Vitals:   07/12/24 1925 07/12/24 2200 07/13/24 1029 07/13/24 1923  BP: 136/76 136/76 133/61 (!) 116/48  Pulse: 92 92 75 99  Resp: 18   19  Temp: (!) 97.2 F (36.2 C)   (!) 97.2 F (36.2 C)  TempSrc:      SpO2: 100%  100% 100%  Weight:      Height:       Physical Exam HENT:     Head: Normocephalic.  Eyes:     General: Lids are normal.     Conjunctiva/sclera: Conjunctivae normal.  Cardiovascular:     Rate and Rhythm: Normal rate and regular rhythm.     Heart sounds: Normal heart sounds, S1 normal and S2 normal.  Pulmonary:     Breath sounds: Examination of the right-lower field reveals decreased breath sounds. Examination of the left-lower field reveals decreased breath sounds. Decreased breath sounds present. No wheezing, rhonchi or rales.  Abdominal:     Palpations: Abdomen is soft.     Tenderness: There is no abdominal tenderness.  Musculoskeletal:     Right lower leg: Swelling present.     Left lower leg: Swelling present.  Skin:    General: Skin is warm.     Findings: No rash.  Neurological:     Mental Status: She is alert.     Data Reviewed: No new  data  Disposition: As per psychiatry team  Planned Discharge Destination: As per psychiatry team    Time spent: 28 minutes  Author: Charlie Patterson, MD 07/14/2024 2:53 PM  For on call review [email or web address].  "

## 2024-07-14 NOTE — Group Note (Signed)
 Date:  07/14/2024 Time:  12:16 PM  Group Topic/Focus:  Managing Feelings:   The focus of this group is to identify what feelings patients have difficulty handling and develop a plan to handle them in a healthier way upon discharge.    Participation Level:  Did Not Attend   Larrie [name] BRAVO 07/14/2024, 12:16 PM

## 2024-07-14 NOTE — Group Note (Signed)
 Date:  07/14/2024 Time:  3:41 PM  Group Topic/Focus:  Coping With Mental Health Crisis:   The purpose of this group is to help patients identify strategies for coping with mental health crisis.  Group discusses possible causes of crisis and ways to manage them effectively.    Participation Level:  Did Not Attend    [name] 07/14/2024, 3:41 PM

## 2024-07-15 DIAGNOSIS — E119 Type 2 diabetes mellitus without complications: Secondary | ICD-10-CM | POA: Diagnosis not present

## 2024-07-15 DIAGNOSIS — F250 Schizoaffective disorder, bipolar type: Secondary | ICD-10-CM | POA: Diagnosis not present

## 2024-07-15 DIAGNOSIS — R000 Tachycardia, unspecified: Secondary | ICD-10-CM | POA: Diagnosis not present

## 2024-07-15 DIAGNOSIS — I9589 Other hypotension: Secondary | ICD-10-CM | POA: Diagnosis not present

## 2024-07-15 DIAGNOSIS — R0789 Other chest pain: Secondary | ICD-10-CM | POA: Diagnosis not present

## 2024-07-15 DIAGNOSIS — E871 Hypo-osmolality and hyponatremia: Secondary | ICD-10-CM | POA: Diagnosis not present

## 2024-07-15 DIAGNOSIS — E7800 Pure hypercholesterolemia, unspecified: Secondary | ICD-10-CM | POA: Diagnosis not present

## 2024-07-15 DIAGNOSIS — R600 Localized edema: Secondary | ICD-10-CM | POA: Diagnosis not present

## 2024-07-15 LAB — BASIC METABOLIC PANEL WITH GFR
Anion gap: 10 (ref 5–15)
BUN: 15 mg/dL (ref 8–23)
CO2: 28 mmol/L (ref 22–32)
Calcium: 9.2 mg/dL (ref 8.9–10.3)
Chloride: 100 mmol/L (ref 98–111)
Creatinine, Ser: 0.66 mg/dL (ref 0.44–1.00)
GFR, Estimated: >60 mL/min
Glucose, Bld: 129 mg/dL — ABNORMAL HIGH (ref 70–99)
Potassium: 4 mmol/L (ref 3.5–5.1)
Sodium: 139 mmol/L (ref 135–145)

## 2024-07-15 LAB — MAGNESIUM: Magnesium: 2.2 mg/dL (ref 1.7–2.4)

## 2024-07-15 LAB — PRO BRAIN NATRIURETIC PEPTIDE: Pro Brain Natriuretic Peptide: 178 pg/mL

## 2024-07-15 MED ORDER — DICLOFENAC SODIUM 1 % EX GEL: 4.0000 g | Freq: Four times a day (QID) | CUTANEOUS | Status: AC | PRN

## 2024-07-15 MED ORDER — FLUTICASONE PROPIONATE 50 MCG/ACT NA SUSP: 1.0000 | Freq: Every day | NASAL | Status: AC | PRN

## 2024-07-15 NOTE — BH IP Treatment Plan (Addendum)
 Interdisciplinary Treatment and Diagnostic Plan Update  07/15/2024 Time of Session: 1030am Staley Budzinski MRN: [id number]  Principal Diagnosis: Bilateral lower extremity edema  Secondary Diagnoses: Principal Problem:   Bilateral lower extremity edema Active Problems:   Hypercholesteremia   Schizoaffective disorder, bipolar type (HCC)   Chest pain   Sinus tachycardia   Hypotension   Controlled type 2 diabetes mellitus without complication, without long-term current use of insulin (HCC)   Hyponatremia   Current Medications:  Current Facility-Administered Medications  Medication Dose Route Frequency Provider Last Rate Last Admin   acetaminophen  (TYLENOL ) tablet 650 mg  650 mg Oral Q6H PRN Onuoha, Chinwendu V, NP   650 mg at 07/11/24 1835   aspirin  EC tablet 81 mg  81 mg Oral Daily Bobbitt, Shalon E, NP   81 mg at 07/15/24 0948   cholecalciferol  (VITAMIN D3) 25 MCG (1000 UNIT) tablet 1,000 Units  1,000 Units Oral Weekly Jadapalle, Sree, MD   1,000 Units at 07/04/24 1201   cloZAPine  (CLOZARIL ) tablet 100 mg  100 mg Oral BID Jadapalle, Sree, MD   100 mg at 07/15/24 9052   Or   OLANZapine  (ZYPREXA ) injection 10 mg  10 mg Intramuscular BID Jadapalle, Sree, MD   10 mg at 07/05/24 2233   dapagliflozin  propanediol (FARXIGA ) tablet 10 mg  10 mg Oral Daily Josette Ade, MD   10 mg at 07/15/24 9052   diclofenac  Sodium (VOLTAREN ) 1 % topical gel 4 g  4 g Topical QID Jadapalle, Sree, MD   4 g at 07/12/24 2222   DULoxetine  (CYMBALTA ) DR capsule 40 mg  40 mg Oral Daily Jadapalle, Sree, MD   40 mg at 07/15/24 9052   famotidine  (PEPCID ) tablet 40 mg  40 mg Oral Daily Shrivastava, Aryendra, MD   40 mg at 07/15/24 0947   fluticasone  (FLONASE ) 50 MCG/ACT nasal spray 1 spray  1 spray Each Nare Daily Bobbitt, Shalon E, NP       furosemide  (LASIX ) tablet 40 mg  40 mg Oral 2 times per day Josette Ade, MD   40 mg at 07/15/24 0801   gabapentin  (NEURONTIN ) capsule 300 mg  300 mg Oral TID Bobbitt,  Shalon E, NP   300 mg at 07/15/24 0948   hydrOXYzine  (ATARAX ) tablet 25 mg  25 mg Oral TID PRN Bobbitt, Shalon E, NP   25 mg at 07/11/24 1805   magnesium  hydroxide (MILK OF MAGNESIA) suspension 30 mL  30 mL Oral Daily PRN Bobbitt, Shalon E, NP       metoprolol  succinate (TOPROL -XL) 24 hr tablet 25 mg  25 mg Oral BID Josette Ade, MD   25 mg at 07/15/24 0947   multivitamin with minerals tablet 1 tablet  1 tablet Oral Daily Bobbitt, Shalon E, NP   1 tablet at 07/15/24 0948   naproxen  (NAPROSYN ) tablet 375 mg  375 mg Oral TID WC Jadapalle, Sree, MD   375 mg at 07/15/24 0801   OLANZapine  (ZYPREXA ) injection 5 mg  5 mg Intramuscular TID PRN Bobbitt, Shalon E, NP   5 mg at 06/22/24 2259   OLANZapine  zydis (ZYPREXA ) disintegrating tablet 5 mg  5 mg Oral TID PRN Bobbitt, Shalon E, NP   5 mg at 07/11/24 1835   ondansetron  (ZOFRAN ) tablet 4 mg  4 mg Oral Q8H PRN Bobbitt, Shalon E, NP   4 mg at 07/11/24 1835   potassium chloride  SA (KLOR-CON  M) CR tablet 20 mEq  20 mEq Oral BID Bobbitt, Shalon E, NP   20 mEq  at 07/15/24 0948   rosuvastatin  (CRESTOR ) tablet 5 mg  5 mg Oral Daily Bobbitt, Shalon E, NP   5 mg at 07/15/24 9052   traZODone  (DESYREL ) tablet 50 mg  50 mg Oral QHS PRN Bobbitt, Shalon E, NP       ziprasidone  (GEODON ) capsule 20 mg  20 mg Oral QHS Jadapalle, Sree, MD   20 mg at 07/14/24 2100   zolpidem  (AMBIEN ) tablet 10 mg  10 mg Oral QHS Jadapalle, Sree, MD   10 mg at 07/14/24 2100   PTA Medications: Medications Prior to Admission  Medication Sig Dispense Refill Last Dose/Taking   acetaminophen  (TYLENOL ) 650 MG CR tablet Take 650 mg by mouth every 8 (eight) hours as needed for pain.      benztropine (COGENTIN) 0.5 MG tablet Take by mouth.      bisacodyl  5 MG EC tablet As directed for procedure 8 tablet 0    Cholecalciferol  (VITAMIN D ) 125 MCG (5000 UT) CAPS Take 1,000 Units by mouth once a week.      cloZAPine  (CLOZARIL ) 100 MG tablet Take 200 mg by mouth 2 (two) times daily.      DULoxetine   (CYMBALTA ) 20 MG capsule Take 20 mg by mouth daily.      fluticasone  (FLONASE ) 50 MCG/ACT nasal spray Place 1 spray into both nostrils daily.      gabapentin  (NEURONTIN ) 300 MG capsule Take 300 mg by mouth 3 (three) times daily.      haloperidol  (HALDOL ) 0.5 MG tablet Take 0.5 mg by mouth 2 (two) times daily.      hydrochlorothiazide  (HYDRODIURIL ) 25 MG tablet Take 25 mg by mouth daily.      lisinopril  (ZESTRIL ) 30 MG tablet Take 30 mg by mouth daily.      loperamide (IMODIUM) 2 MG capsule Take 2 mg by mouth as needed for diarrhea or loose stools. Take 1 capsule by mouth as needed with each loose stool/diarrhea nte 8 doses in 24 hr      Multiple Vitamin (DAILY VITE PO) Take 1 tablet by mouth daily.      naproxen  (NAPROSYN ) 500 MG tablet Take 500 mg by mouth every 12 (twelve) hours as needed.      omeprazole (PRILOSEC) 20 MG capsule Take 20 mg by mouth daily.      ondansetron  (ZOFRAN ) 4 MG tablet Take 4 mg by mouth every 8 (eight) hours as needed for nausea or vomiting.      potassium chloride  SA (KLOR-CON  M) 20 MEQ tablet Take 20 mEq by mouth 2 (two) times daily.      propranolol  (INDERAL ) 40 MG tablet Take 40 mg by mouth 2 (two) times daily.      rosuvastatin  (CRESTOR ) 5 MG tablet Take 5 mg by mouth daily.      sodium phosphate  (FLEET) ENEM As directed for procedure 133 mL 0    triamcinolone ointment (KENALOG) 0.1 % Apply topically.       Patient Stressors: Medication change or noncompliance    Patient Strengths: Ability for insight   Treatment Modalities: Medication Management, Group therapy, Case management,  1 to 1 session with clinician, Psychoeducation, Recreational therapy.   Physician Treatment Plan for Primary Diagnosis: Bilateral lower extremity edema Long Term Goal(s): Improvement in symptoms so as ready for discharge   Short Term Goals: Ability to identify changes in lifestyle to reduce recurrence of condition will improve Ability to verbalize feelings will improve Ability  to disclose and discuss suicidal ideas Ability to demonstrate self-control will  improve Ability to identify and develop effective coping behaviors will improve  Medication Management: Evaluate patient's response, side effects, and tolerance of medication regimen.  Therapeutic Interventions: 1 to 1 sessions, Unit Group sessions and Medication administration.  Evaluation of Outcomes: not progressing  Physician Treatment Plan for Secondary Diagnosis: Principal Problem:   Bilateral lower extremity edema Active Problems:   Hypercholesteremia   Schizoaffective disorder, bipolar type (HCC)   Chest pain   Sinus tachycardia   Hypotension   Controlled type 2 diabetes mellitus without complication, without long-term current use of insulin (HCC)   Hyponatremia  Long Term Goal(s): Improvement in symptoms so as ready for discharge   Short Term Goals: Ability to identify changes in lifestyle to reduce recurrence of condition will improve Ability to verbalize feelings will improve Ability to disclose and discuss suicidal ideas Ability to demonstrate self-control will improve Ability to identify and develop effective coping behaviors will improve     Medication Management: Evaluate patient's response, side effects, and tolerance of medication regimen.  Therapeutic Interventions: 1 to 1 sessions, Unit Group sessions and Medication administration.  Evaluation of Outcomes: not progressing   RN Treatment Plan for Primary Diagnosis: Bilateral lower extremity edema Long Term Goal(s): Knowledge of disease and therapeutic regimen to maintain health will improve  Short Term Goals: Ability to remain free from injury will improve, Ability to verbalize frustration and anger appropriately will improve, Ability to demonstrate self-control, Ability to participate in decision making will improve, Ability to verbalize feelings will improve, Ability to disclose and discuss suicidal ideas, Ability to identify and  develop effective coping behaviors will improve, and Compliance with prescribed medications will improve  Medication Management: RN will administer medications as ordered by provider, will assess and evaluate patient's response and provide education to patient for prescribed medication. RN will report any adverse and/or side effects to prescribing provider.  Therapeutic Interventions: 1 on 1 counseling sessions, Psychoeducation, Medication administration, Evaluate responses to treatment, Monitor vital signs and CBGs as ordered, Perform/monitor CIWA, COWS, AIMS and Fall Risk screenings as ordered, Perform wound care treatments as ordered.  Evaluation of Outcomes: Progressing   LCSW Treatment Plan for Primary Diagnosis: Bilateral lower extremity edema Long Term Goal(s): Safe transition to appropriate next level of care at discharge, Engage patient in therapeutic group addressing interpersonal concerns.  Short Term Goals: Engage patient in aftercare planning with referrals and resources, Increase social support, Increase ability to appropriately verbalize feelings, Increase emotional regulation, Facilitate acceptance of mental health diagnosis and concerns, Facilitate patient progression through stages of change regarding substance use diagnoses and concerns, Identify triggers associated with mental health/substance abuse issues, and Increase skills for wellness and recovery  Therapeutic Interventions: Assess for all discharge needs, 1 to 1 time with Social worker, Explore available resources and support systems, Assess for adequacy in community support network, Educate family and significant other(s) on suicide prevention, Complete Psychosocial Assessment, Interpersonal group therapy.  Evaluation of Outcomes: not progressing   Progress in Treatment: Attending groups: Yes.and no Participating in groups: No. Taking medication as prescribed: Yes. Toleration medication: Yes. Family/Significant  other contact made: Yes, individual(s) contacted:  Yes, pt's sister and mother  Patient understands diagnosis: No. Discussing patient identified problems/goals with staff: Yes. Medical problems stabilized or resolved: Yes. Denies suicidal/homicidal ideation: Yes. Issues/concerns per patient self-inventory: Yes. Other: none  New problem(s) identified: No, Describe:  none  New Short Term/Long Term Goal(s):elimination of symptoms of psychosis, medication management for mood stabilization; elimination of SI thoughts; development of comprehensive mental  wellness plan. 06/29/24 Update: No changes at this time. Update 07/04/24: No changes at this time Update 07/10/24: No changes at this time Update 07/10/24: No changes at this time 07/15/24 no changes  Patient Goals:    To continue to get well 06/29/24 Update: No changes at this time. Update 07/04/24: No changes at this time Update 07/10/24: No changes at this time Update 07/10/24: No changes at this time 07/15/24 no changes  Discharge Plan or Barriers: CSW will assist with appropriate discharge planning 06/29/24 Update:  CSW has made APS report to [facility] following safety concerns. CSW to continue to coordinate with DSS worker to continue to assess. Update 07/04/24: No changes at this time Update 07/10/24: No changes at this time Update 07/10/24: DSS reports no changes at this time, DSS unsure if pt's family has filed for the guardianship. Caseworker Adams Louder reports she will check in with CSW after New Years 07/15/2024 no changes    Reason for Continuation of Hospitalization: Mania Medication stabilization  Estimated Length of Stay: TBD 07/15/24  Last 3 [place] Suicide Severity Risk Score: Flowsheet Row Admission (Current) from 06/19/2024 in [facility] ED from 06/18/2024 in [facility] at [facility] from 05/24/2022 in [facility] Emergency Department at [facility]  C-SSRS  RISK CATEGORY No Risk No Risk No Risk    Last PHQ 2/9 Scores:    12/21/2022    9:00 AM  Depression screen PHQ 2/9  Decreased Interest 0  Down, Depressed, Hopeless 0  PHQ - 2 Score 0    Scribe for Treatment Team: Pamila Nine, KEN 07/15/2024 11:00 AM

## 2024-07-15 NOTE — Group Note (Signed)
 Date:  07/15/2024 Time:  10:36 AM  Group Topic/Focus:  Coping With Mental Health Crisis:   The purpose of this group is to help patients identify strategies for coping with mental health crisis.  Group discusses possible causes of crisis and ways to manage them effectively.    Participation Level:  Did Not Attend    Valerie [name] Bias 07/15/2024, 10:36 AM

## 2024-07-15 NOTE — Group Note (Signed)
 LCSW Group Therapy Note  Group Date: 07/15/2024 Start Time: 1032 End Time: 1110   Type of Therapy and Topic:  Group Therapy - Healthy vs Unhealthy Coping Skills  Participation Level:  Did Not Attend   Description of Group The focus of this group was to determine what unhealthy coping techniques typically are used by group members and what healthy coping techniques would be helpful in coping with various problems. Patients were guided in becoming aware of the differences between healthy and unhealthy coping techniques. Patients were asked to identify 2-3 healthy coping skills they would like to learn to use more effectively.  Therapeutic Goals Patients learned that coping is what human beings do all day long to deal with various situations in their lives Patients defined and discussed healthy vs unhealthy coping techniques Patients identified their preferred coping techniques and identified whether these were healthy or unhealthy Patients determined 2-3 healthy coping skills they would like to become more familiar with and use more often. Patients provided support and ideas to each other   Summary of Patient Progress:  Patient did not attend group.   Therapeutic Modalities Cognitive Behavioral Therapy Motivational Interviewing  Dorann Davidson W Tykeshia Tourangeau, LCSWA 07/15/2024  12:16 PM

## 2024-07-15 NOTE — Plan of Care (Signed)
  Problem: Health Behavior/Discharge Planning: Goal: Compliance with treatment plan for underlying cause of condition will improve Outcome: Progressing   Problem: Health Behavior/Discharge Planning: Goal: Identification of resources available to assist in meeting health care needs will improve Outcome: Not Progressing

## 2024-07-15 NOTE — Progress Notes (Signed)
" °   07/15/24 1300  Psych Admission Type (Psych Patients Only)  Admission Status Involuntary  Psychosocial Assessment  Patient Complaints None  Eye Contact Brief  Facial Expression Animated  Affect Labile  Speech Pressured  Interaction Assertive  Motor Activity Slow  Appearance/Hygiene Unremarkable  Behavior Characteristics Appropriate to situation  Mood Labile  Thought Process  Coherency Loose associations  Content Preoccupation  Delusions Religious;Grandeur  Perception WDL  Hallucination None reported or observed  Judgment Impaired  Confusion Mild  Danger to Self  Current suicidal ideation? Denies  Danger to Others  Danger to Others None reported or observed    "

## 2024-07-15 NOTE — Progress Notes (Signed)
 " Progress Note   Patient: Valerie [name] FMW:[id number] DOB: 01-03-1961 DOA: 06/19/2024     26 DOS: the patient was seen and examined on 07/15/2024   Brief hospital course: Valerie [name] is a 64 y.o. female with past medical history of schizophrenia and bipolar disorder who was admitted at geropsych facility due to manic episode.  She was complaining of some intermittent chest pain, mostly involving upper retrosternal area, intermittent, no relationship with activity and no radiation.  No associated factors.  Sometimes pain increased with moving around her arms, worsening bilateral lower extremity edema, denies any shortness of breath orthopnea or PND.   Psych was also concerned about her fluctuating blood pressure going from borderline soft to little high. Psych was making some changes to her home medications.   12/30: Vitals and labs stable.  Troponin negative x 2. Echocardiogram done with pending results.  Small improvement in lower extremity edema so continuing p.o. Lasix .  Will need BMP every other day. 12/31.  Held Lasix  and lisinopril  secondary to low blood pressure.  Switched propranolol  over to Toprol -XL twice daily. 07/13/2024.  Blood pressure improved.  Will increase Lasix  to 40 mg orally twice daily for now.  Continue Toprol  XL. 1/2.  Continue Lasix  40 mg twice a day. 1/3.  Change diabetic medications over to Farxiga .  proBNP negative.  Assessment and Plan: * Bilateral lower extremity edema Continue Lasix  40 mg p.o. twice daily.  Continue Toprol -XL.  Unable to do compression stockings on the psychiatric floor.  Added Farxiga .  proBNP negative.  Sinus tachycardia Heart rate improved with changing propranolol  over to Toprol -XL twice daily.  Hypotension Resolved.  Chest pain Cardiac enzymes negative.  Echocardiogram showed EF of 65%.  Likely GERD.  Hypercholesteremia Continue home Crestor   Hyponatremia Sodium now in the normal range  Controlled type 2 diabetes mellitus  without complication, without long-term current use of insulin (HCC) Change diabetes medication over to Farxiga  and get rid of metformin  and Tradjenta .  Last hemoglobin A1c 5.9  Schizoaffective disorder, bipolar type (HCC) Patient on Geodon , trazodone , Cymbalta , clozapine , gabapentin         Subjective: Patient feels fine.  Offers no complaints.  States she is urinating well.  Still has swelling of the lower extremities.  Physical Exam: Vitals:   07/13/24 1029 07/13/24 1923 07/14/24 1957 07/15/24 0738  BP: 133/61 (!) 116/48 115/60 127/76  Pulse: 75 99 85 99  Resp:  19 18 18   Temp:  (!) 97.2 F (36.2 C) (!) 97.2 F (36.2 C) 98.7 F (37.1 C)  TempSrc:   Temporal   SpO2: 100% 100% 100% 100%  Weight:      Height:       Physical Exam HENT:     Head: Normocephalic.  Eyes:     General: Lids are normal.     Conjunctiva/sclera: Conjunctivae normal.  Cardiovascular:     Rate and Rhythm: Normal rate and regular rhythm.     Heart sounds: Normal heart sounds, S1 normal and S2 normal.  Pulmonary:     Breath sounds: Examination of the right-lower field reveals decreased breath sounds. Examination of the left-lower field reveals decreased breath sounds. Decreased breath sounds present. No wheezing, rhonchi or rales.  Abdominal:     Palpations: Abdomen is soft.     Tenderness: There is no abdominal tenderness.  Musculoskeletal:     Right lower leg: Swelling present.     Left lower leg: Swelling present.  Skin:    General: Skin is warm.  Findings: No rash.  Neurological:     Mental Status: She is alert.     Data Reviewed: Sodium 139, creatinine 0.66, magnesium  2.2, proBNP 178  Family Communication: As per psychiatry team  Disposition: As per psychiatry team  Planned Discharge Destination: To be determined as per psychiatry team    Time spent: 27 minutes Case discussed with nursing staff  Author: Charlie Patterson, MD 07/15/2024 2:31 PM  For on call review  [email or web address].  "

## 2024-07-15 NOTE — Progress Notes (Signed)
(  Sleep Hours) - 7.50 (Any PRNs that were needed, meds refused, or side effects to meds)- None required/requested (Any disturbances and when (visitation, over night)- None reported/observed (Concerns raised by the patient)- None reported/observed (SI/HI/AVH)-.Denies

## 2024-07-15 NOTE — Progress Notes (Signed)
 SI/HI/AVH: denies all  Behavior/Mood:appropriate/labile     Interaction/Group attendance:assertive/ 0/3 groups     Medication/PRNs: compliant/no PRN's   Pain: denies  Other: n/a

## 2024-07-15 NOTE — Plan of Care (Signed)
 Patient alert and oriented x 2. Denies SI, HI, AVH and pain. Meds administered per MAR. No PRN's requested or needed. Support and encouragement provided.  Routine safety checks conducted every 15 minutes.  Patient informed to notify staff with problems or concerns. No adverse drug reactions noted. Patient verbally contracts for safety at this time. Patient interacts well with other today, w/intermittent clashes. Patient remains safe at this time.  Problem: Education: Goal: Mental status will improve Outcome: Progressing   Problem: Education: Goal: Verbalization of understanding the information provided will improve Outcome: Progressing   Problem: Coping: Goal: Ability to verbalize frustrations and anger appropriately will improve Outcome: Progressing Goal: Ability to demonstrate self-control will improve Outcome: Progressing   Problem: Health Behavior/Discharge Planning: Goal: Identification of resources available to assist in meeting health care needs will improve Outcome: Progressing Goal: Compliance with treatment plan for underlying cause of condition will improve Outcome: Progressing   Problem: Safety: Goal: Periods of time without injury will increase Outcome: Progressing

## 2024-07-16 DIAGNOSIS — E871 Hypo-osmolality and hyponatremia: Secondary | ICD-10-CM | POA: Diagnosis not present

## 2024-07-16 DIAGNOSIS — R000 Tachycardia, unspecified: Secondary | ICD-10-CM | POA: Diagnosis not present

## 2024-07-16 DIAGNOSIS — F250 Schizoaffective disorder, bipolar type: Secondary | ICD-10-CM | POA: Diagnosis not present

## 2024-07-16 DIAGNOSIS — R0789 Other chest pain: Secondary | ICD-10-CM | POA: Diagnosis not present

## 2024-07-16 DIAGNOSIS — R600 Localized edema: Secondary | ICD-10-CM | POA: Diagnosis not present

## 2024-07-16 DIAGNOSIS — I9589 Other hypotension: Secondary | ICD-10-CM | POA: Diagnosis not present

## 2024-07-16 DIAGNOSIS — E119 Type 2 diabetes mellitus without complications: Secondary | ICD-10-CM | POA: Diagnosis not present

## 2024-07-16 DIAGNOSIS — E7800 Pure hypercholesterolemia, unspecified: Secondary | ICD-10-CM | POA: Diagnosis not present

## 2024-07-16 NOTE — Group Note (Signed)
 [facility] LCSW Group Therapy Note   Group Date: 07/16/2024 Start Time: 1030 End Time: 1115   Type of Therapy/Topic:  Group Therapy:  Emotion Regulation  Participation Level:  Did Not Attend   Mood: not able to assess.  Pt did not attend group.  Description of Group:    The purpose of this group is to assist patients in learning to regulate negative emotions and experience positive emotions. Patients will be guided to discuss ways in which they have been vulnerable to their negative emotions. These vulnerabilities will be juxtaposed with experiences of positive emotions or situations, and patients challenged to use positive emotions to combat negative ones. Special emphasis will be placed on coping with negative emotions in conflict situations, and patients will process healthy conflict resolution skills.  Therapeutic Goals: Patient will identify two positive emotions or experiences to reflect on in order to balance out negative emotions:  Patient will label two or more emotions that they find the most difficult to experience:  Patient will be able to demonstrate positive conflict resolution skills through discussion or role plays:   Summary of Patient Progress: Pt did not attend or participate in group.  Therapeutic Modalities:   Cognitive Behavioral Therapy Feelings Identification Dialectical Behavioral Therapy   Rexene LELON Mae, LCSWA

## 2024-07-16 NOTE — Progress Notes (Signed)
 [facility] MD Progress Note   2:38 PM Valerie [name]  MRN:  [id number]   Valerie [name] is a 64 year old female presenting voluntary to [facility] requesting a psychiatric evaluation for worsening psychosis. Patient has history of schizophrenia. Patient denied SI, HI, psychosis and alcohol/drug usage. Patient resides at [facility]. Patient reports people are coming by my door talking loud, yelling and screaming and coming in my room with keys. Patient reports having multiple medical Afibs and blood clots due to this. Patient reports people are doing things in the dining room. When asked if she called EMS, patient stated I called EMS, I stripped searched over the phone and called the authorities. Patient is admitted to [facility] unit with Q15 min safety monitoring. Multidisciplinary team approach is offered. Medication management; group/milieu therapy is offered.   Sister Dejuana Weist : [phone] called on 07/07/24 went to generic voicemail-sister called back and provider discussed at length the current treatment plan of patient being on Clozaril -intermittently refusing, enforced with Zyprexa  IM and recent and addition of Geodon .  Due to patient's ongoing poor response to medications and ongoing delusions discussed the plan of switching Clozaril  to another antipsychotic that comes in LAI form.  Clozaril  may not be the best option for patients who are noncompliant and refusing labs.  Sister is agreeable with the plan  Subjective:  Chart reviewed, case discussed in multidisciplinary meeting, patient seen during rounds.  Shown some improvement in her mentation and she is able to engage participate in some meetings but still believes that people are poisoning her meds and not giving her more medication she reported to me that they gave her positively Geodon  and combining medications but there has been some improvement where her delusional thought process is not that intense as when  she came in she denies any current suicidal homicidal thoughts she does not have any insight  Past Psychiatric History: see h&P Family History:  Family History  Problem Relation Age of Onset   Colon cancer Neg Hx    Celiac disease Neg Hx    Inflammatory bowel disease Neg Hx    Social History:  Social History   Substance and Sexual Activity  Alcohol Use Never     Social History   Substance and Sexual Activity  Drug Use Never    Social History   Socioeconomic History   Marital status: Single    Spouse name: Not on file   Number of children: Not on file   Years of education: Not on file   Highest education level: Not on file  Occupational History   Not on file  Tobacco Use   Smoking status: Never   Smokeless tobacco: Never  Substance and Sexual Activity   Alcohol use: Never   Drug use: Never   Sexual activity: Not Currently    Birth control/protection: Pill  Other Topics Concern   Not on file  Social History Narrative   Not on file   Social Drivers of Health   Tobacco Use: Low Risk (06/19/2024)   Patient History    Smoking Tobacco Use: Never    Smokeless Tobacco Use: Never    Passive Exposure: Not on file  Financial Resource Strain: Not on file  Food Insecurity: Unknown (06/19/2024)   Epic    Worried About Programme Researcher, Broadcasting/film/video in the Last Year: Never true    Ran Out of Food in the Last Year: Patient declined  Transportation Needs: No Transportation Needs (06/19/2024)  Epic    Lack of Transportation (Medical): No    Lack of Transportation (Non-Medical): No  Physical Activity: Not on file  Stress: Not on file  Social Connections: Not on file  Depression (PHQ2-9): Low Risk (12/21/2022)   Depression (PHQ2-9)    PHQ-2 Score: 0  Alcohol Screen: Low Risk (06/19/2024)   Alcohol Screen    Last Alcohol Screening Score (AUDIT): 0  Housing: Low Risk (06/19/2024)   Epic    Unable to Pay for Housing in the Last Year: No    Number of Times Moved in the Last Year: 0     Homeless in the Last Year: No  Utilities: Not At Risk (06/19/2024)   Epic    Threatened with loss of utilities: No  Health Literacy: Not on file   Past Medical History:  Past Medical History:  Diagnosis Date   Chronic lower extremity pain (2ry area of Pain) (Right) 12/21/2022   Chronic pain syndrome 12/20/2022   DDD (degenerative disc disease), 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 Tobie: Pancolonic diverticulosis, tortuous colon, next colonoscopy 2024   TONSILLECTOMY      Current Medications: Current Facility-Administered Medications  Medication Dose Route Frequency Provider Last Rate Last Admin   acetaminophen  (TYLENOL ) tablet 650 mg  650 mg Oral Q6H PRN Onuoha, Chinwendu V, NP   650 mg at 07/11/24 1835   aspirin  EC tablet 81 mg  81 mg Oral Daily Bobbitt, Shalon E, NP   81 mg at 07/16/24 0940   cholecalciferol  (VITAMIN D3) 25 MCG (1000 UNIT) tablet 1,000 Units  1,000 Units Oral Weekly Jadapalle, Sree, MD   1,000 Units at 07/04/24 1201   cloZAPine  (CLOZARIL ) tablet 100 mg  100 mg Oral BID Jadapalle, Sree, MD   100 mg at 07/16/24 9058   Or   OLANZapine  (ZYPREXA ) injection 10 mg  10 mg Intramuscular BID Jadapalle, Sree, MD   10 mg at 07/05/24 2233   dapagliflozin  propanediol (FARXIGA ) tablet 10 mg  10 mg Oral Daily Josette Ade, MD   10 mg at 07/16/24 0940   diclofenac  Sodium (VOLTAREN ) 1 % topical gel 4 g  4 g Topical QID PRN Ansley Stanwood R, MD       DULoxetine  (CYMBALTA ) DR capsule 40 mg  40 mg Oral Daily Jadapalle, Sree, MD   40 mg at 07/16/24 0941   famotidine  (PEPCID ) tablet 40 mg  40 mg Oral Daily Shrivastava, Aryendra, MD   40 mg at 07/16/24 0940   fluticasone  (FLONASE ) 50 MCG/ACT nasal spray 1 spray  1 spray Each Nare Daily PRN Conswella Bruney R, MD       furosemide  (LASIX ) tablet 40 mg  40 mg Oral 2 times per day Josette Ade, MD   40 mg at 07/16/24 0759   gabapentin  (NEURONTIN ) capsule 300 mg  300 mg Oral TID Bobbitt, Shalon E, NP   300 mg at 07/16/24 0940   hydrOXYzine  (ATARAX ) tablet 25 mg  25 mg Oral TID PRN Bobbitt, Shalon E, NP   25 mg at 07/11/24 1805   magnesium  hydroxide (MILK OF MAGNESIA) suspension 30 mL  30 mL Oral Daily PRN Bobbitt, Shalon E, NP       metoprolol  succinate (TOPROL -XL) 24 hr tablet 25 mg  25 mg Oral BID Josette Ade, MD   25 mg at  07/16/24 0940   multivitamin with minerals tablet 1 tablet  1 tablet Oral Daily Bobbitt, Shalon E, NP   1 tablet at 07/16/24 0940   naproxen  (NAPROSYN ) tablet 375 mg  375 mg Oral TID WC Jadapalle, Sree, MD   375 mg at 07/16/24 1204   OLANZapine  (ZYPREXA ) injection 5 mg  5 mg Intramuscular TID PRN Bobbitt, Shalon E, NP   5 mg at 06/22/24 2259   OLANZapine  zydis (ZYPREXA ) disintegrating tablet 5 mg  5 mg Oral TID PRN Bobbitt, Shalon E, NP   5 mg at 07/11/24 1835   ondansetron  (ZOFRAN ) tablet 4 mg  4 mg Oral Q8H PRN Bobbitt, Shalon E, NP   4 mg at 07/11/24 1835   potassium chloride  SA (KLOR-CON  M) CR tablet 20 mEq  20 mEq Oral BID Bobbitt, Shalon E, NP   20 mEq at 07/16/24 0940   rosuvastatin  (CRESTOR ) tablet 5 mg  5 mg Oral Daily Bobbitt, Shalon E, NP   5 mg at 07/16/24 0940   traZODone  (DESYREL ) tablet 50 mg  50 mg Oral QHS PRN Bobbitt, Shalon E, NP       ziprasidone  (GEODON ) capsule 20 mg  20 mg Oral QHS Jadapalle, Sree, MD   20 mg at 07/15/24 2235   zolpidem  (AMBIEN ) tablet 10 mg  10 mg Oral QHS Jadapalle, Sree, MD   10 mg at 07/15/24 2234    Lab Results:  Results for orders placed or performed during the hospital encounter of 06/19/24 (from the past 48 hours)  Basic metabolic panel     Status: Abnormal   Collection Time: 07/15/24  6:57 AM  Result Value Ref Range   Sodium 139 135 - 145 mmol/L   Potassium 4.0 3.5 - 5.1 mmol/L    Comment: HEMOLYSIS AT THIS LEVEL MAY AFFECT RESULT   Chloride 100 98 - 111 mmol/L   CO2 28 22 - 32 mmol/L   Glucose,  Bld 129 (H) 70 - 99 mg/dL    Comment: Glucose reference range applies only to samples taken after fasting for at least 8 hours.   BUN 15 8 - 23 mg/dL   Creatinine, Ser 9.33 0.44 - 1.00 mg/dL   Calcium  9.2 8.9 - 10.3 mg/dL   GFR, Estimated >39 >39 mL/min    Comment: (NOTE) Calculated using the CKD-EPI Creatinine Equation (2021)    Anion gap 10 5 - 15    Comment: Performed at [facility], [address] [place], [place] [address]  Magnesium      Status: None   Collection Time: 07/15/24  6:57 AM  Result Value Ref Range   Magnesium  2.2 1.7 - 2.4 mg/dL    Comment: Performed at [facility], [address] [place], [place] [address]  Pro Brain natriuretic peptide     Status: None   Collection Time: 07/15/24  6:57 AM  Result Value Ref Range   Pro Brain Natriuretic Peptide 178.0 <300.0 pg/mL    Comment: (NOTE) Age Group        Cut-Points    Interpretation  < 50 years     450 pg/mL       NT-proBNP > 450 pg/mL indicates                                ADHF is likely              50 to 75 years  900 pg/mL  NT-proBNP > 900 pg/mL indicates          ADHF is likely  > 75 years      1800 pg/mL     NT-proBNP > 1800 pg/mL indicates          ADHF is likely                           All ages    Results between       Indeterminate. Further clinical             300 and the cut-   information is needed to determine            point for age group   if ADHF is present.                                                             Elecsys proBNP II/ Elecsys proBNP II STAT           Cut-Point                       Interpretation  300 pg/mL                    NT-proBNP <300pg/mL indicates                             ADHF is not likely  Performed at [facility], [address] [place], [place] [address]         Blood Alcohol level:  Lab Results  Component Value Date   [facility] <15 06/18/2024    Metabolic Disorder Labs: Lab Results  Component Value Date    HGBA1C 5.9 (H) 06/28/2024   MPG 122.63 06/28/2024   No results found for: PROLACTIN Lab Results  Component Value Date   CHOL 156 06/28/2024   TRIG 89 06/28/2024   HDL 70 06/28/2024   CHOLHDL 2.2 06/28/2024   VLDL 18 06/28/2024   LDLCALC 69 06/28/2024    Physical Findings: AIMS:  , ,  ,  ,    CIWA:    COWS:      Psychiatric Specialty Exam:  Presentation  General Appearance:  Casual  Eye Contact: Fleeting  Speech: Normal Rate  Speech Volume: Increased    Mood and Affect  Mood: Euphoric  Affect: Labile   Thought Process  Thought Processes: Disorganized  Orientation:Full (Time, Place and Person)  Thought Content:Illogical; Delusions; Tangential  Hallucinations: Denies  Ideas of Reference:Delusions  Suicidal Thoughts: Denies  Homicidal Thoughts: Denies   Sensorium  Memory: Immediate Fair; Remote Fair  Judgment: Impaired  Insight: Shallow   Executive Functions  Concentration: Poor  Attention Span: Poor  Recall: [employer] of Knowledge: Fair  Language: Fair   Psychomotor Activity  Psychomotor Activity: No data recorded  Musculoskeletal: Strength & Muscle Tone: within normal limits Gait & Station: normal Assets  Assets: Manufacturing Systems Engineer; Desire for Improvement    Physical Exam: Physical Exam Vitals and nursing note reviewed.    ROS Blood pressure (!) 142/90, pulse 94, temperature 97.6 F (36.4 C), resp. rate 15, height 5' 5 (1.651 m), weight 81 kg, SpO2 100%. Body mass  index is 29.7 kg/m.  Diagnosis: Principal Problem:   Bilateral lower extremity edema Active Problems:   Hypercholesteremia   Schizoaffective disorder, bipolar type (HCC)   Chest pain   Sinus tachycardia   Hypotension   Controlled type 2 diabetes mellitus without complication, without long-term current use of insulin (HCC)   Hyponatremia  Schizoaffective disorder bipolar type  Clinical Decision Making: Patient is currently  admitted for worsening psychosis, manic symptoms, tangential grandiosity.  She needs inpatient hospitalization for medication management and close monitoring.  Patient is unable to make decisions on her own as she is unable to appreciate and understand her mental health history, the current need for treatment.  [facility] has screened her case and for further evaluation for legal guardianship. [facility] screened in her case. Sister and mom are involved in her care.  Treatment Plan Summary:  Safety and Monitoring:             -- inoluntary admission to inpatient psychiatric unit for safety, stabilization and treatment             -- Daily contact with patient to assess and evaluate symptoms and progress in treatment             -- Patient's case to be discussed in multi-disciplinary team meeting             -- Observation Level: q15 minute checks             -- Vital signs:  q12 hours             -- Precautions: suicide, elopement, and assault   2. Psychiatric Diagnoses and Treatment:  Discontinue Depakote  as patient is noncompliant ContGeodon 20 mg nightly as patient is agreeable to take Geodon  Clozaril  100 mg twice daily enforced with zyprexa  10mg  BID- Cymbalta  40 mg daily   -- The risks/benefits/side-effects/alternatives to this medication were discussed in detail with the patient and time was given for questions. The patient consents to medication trial.                -- Metabolic profile and EKG monitoring obtained while on an atypical antipsychotic (BMI: Lipid Panel: HbgA1c: QTc:)              -- Encouraged patient to participate in unit milieu and in scheduled group therapies                            3. Medical Issues Being Addressed:   Chest pain Sinus tachycardia HTN (hypertension) Bilateral lower extremity edema Hypercholesteremia  12/30:  Troponin negative x 2. Echocardiogram done with pending results.  Small improvement in lower extremity edema so continuing p.o. Lasix .  Will need  BMP every other day.  4. Discharge Planning:   -- Social work and case management to assist with discharge planning and identification of hospital follow-up needs prior to discharge  -- Estimated LOS: 3-4 days  Millie JONELLE Manners, MD 07/16/2024, 2:38 PM

## 2024-07-16 NOTE — Progress Notes (Signed)
 [facility] MD Progress Note   6:01 PM Valerie [name]  MRN:  [id number]   Valerie [name] is a 64 year old female presenting voluntary to [facility] requesting a psychiatric evaluation for worsening psychosis. Patient has history of schizophrenia. Patient denied SI, HI, psychosis and alcohol/drug usage. Patient resides at [facility]. Patient reports people are coming by my door talking loud, yelling and screaming and coming in my room with keys. Patient reports having multiple medical Afibs and blood clots due to this. Patient reports people are doing things in the dining room. When asked if she called EMS, patient stated I called EMS, I stripped searched over the phone and called the authorities. Patient is admitted to [facility] unit with Q15 min safety monitoring. Multidisciplinary team approach is offered. Medication management; group/milieu therapy is offered.   Sister Emaly Boschert : [phone] called on 07/07/24 went to generic voicemail-sister called back and provider discussed at length the current treatment plan of patient being on Clozaril -intermittently refusing, enforced with Zyprexa  IM and recent and addition of Geodon .  Due to patient's ongoing poor response to medications and ongoing delusions discussed the plan of switching Clozaril  to another antipsychotic that comes in LAI form.  Clozaril  may not be the best option for patients who are noncompliant and refusing labs.  Sister is agreeable with the plan  Subjective:  Chart reviewed, case discussed in multidisciplinary meeting, patient seen during rounds.  Shown some improvement in her mentation and she is able to engage participate in some meetings but still believes that people are poisoning her meds and not giving her more medication she reported to me that they gave her positively Geodon  and combining medications but there has been some improvement where her delusional thought process is not that intense as when  she came in she denies any current suicidal homicidal thoughts she does not have any insight  Past Psychiatric History: see h&P Family History:  Family History  Problem Relation Age of Onset   Colon cancer Neg Hx    Celiac disease Neg Hx    Inflammatory bowel disease Neg Hx    Social History:  Social History   Substance and Sexual Activity  Alcohol Use Never     Social History   Substance and Sexual Activity  Drug Use Never    Social History   Socioeconomic History   Marital status: Single    Spouse name: Not on file   Number of children: Not on file   Years of education: Not on file   Highest education level: Not on file  Occupational History   Not on file  Tobacco Use   Smoking status: Never   Smokeless tobacco: Never  Substance and Sexual Activity   Alcohol use: Never   Drug use: Never   Sexual activity: Not Currently    Birth control/protection: Pill  Other Topics Concern   Not on file  Social History Narrative   Not on file   Social Drivers of Health   Tobacco Use: Low Risk (06/19/2024)   Patient History    Smoking Tobacco Use: Never    Smokeless Tobacco Use: Never    Passive Exposure: Not on file  Financial Resource Strain: Not on file  Food Insecurity: Unknown (06/19/2024)   Epic    Worried About Programme Researcher, Broadcasting/film/video in the Last Year: Never true    Ran Out of Food in the Last Year: Patient declined  Transportation Needs: No Transportation Needs (06/19/2024)  Epic    Lack of Transportation (Medical): No    Lack of Transportation (Non-Medical): No  Physical Activity: Not on file  Stress: Not on file  Social Connections: Not on file  Depression (PHQ2-9): Low Risk (12/21/2022)   Depression (PHQ2-9)    PHQ-2 Score: 0  Alcohol Screen: Low Risk (06/19/2024)   Alcohol Screen    Last Alcohol Screening Score (AUDIT): 0  Housing: Low Risk (06/19/2024)   Epic    Unable to Pay for Housing in the Last Year: No    Number of Times Moved in the Last Year: 0     Homeless in the Last Year: No  Utilities: Not At Risk (06/19/2024)   Epic    Threatened with loss of utilities: No  Health Literacy: Not on file   Past Medical History:  Past Medical History:  Diagnosis Date   Chronic lower extremity pain (2ry area of Pain) (Right) 12/21/2022   Chronic pain syndrome 12/20/2022   DDD (degenerative disc disease), 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 Tobie: Pancolonic diverticulosis, tortuous colon, next colonoscopy 2024   TONSILLECTOMY      Current Medications: Current Facility-Administered Medications  Medication Dose Route Frequency Provider Last Rate Last Admin   acetaminophen  (TYLENOL ) tablet 650 mg  650 mg Oral Q6H PRN Onuoha, Chinwendu V, NP   650 mg at 07/11/24 1835   aspirin  EC tablet 81 mg  81 mg Oral Daily Bobbitt, Shalon E, NP   81 mg at 07/16/24 0940   cholecalciferol  (VITAMIN D3) 25 MCG (1000 UNIT) tablet 1,000 Units  1,000 Units Oral Weekly Jadapalle, Sree, MD   1,000 Units at 07/04/24 1201   cloZAPine  (CLOZARIL ) tablet 100 mg  100 mg Oral BID Jadapalle, Sree, MD   100 mg at 07/16/24 9058   Or   OLANZapine  (ZYPREXA ) injection 10 mg  10 mg Intramuscular BID Jadapalle, Sree, MD   10 mg at 07/05/24 2233   dapagliflozin  propanediol (FARXIGA ) tablet 10 mg  10 mg Oral Daily Josette Ade, MD   10 mg at 07/16/24 0940   diclofenac  Sodium (VOLTAREN ) 1 % topical gel 4 g  4 g Topical QID PRN Donelda Mailhot R, MD       DULoxetine  (CYMBALTA ) DR capsule 40 mg  40 mg Oral Daily Jadapalle, Sree, MD   40 mg at 07/16/24 0941   famotidine  (PEPCID ) tablet 40 mg  40 mg Oral Daily Shrivastava, Aryendra, MD   40 mg at 07/16/24 0940   fluticasone  (FLONASE ) 50 MCG/ACT nasal spray 1 spray  1 spray Each Nare Daily PRN Talicia Sui R, MD       furosemide  (LASIX ) tablet 40 mg  40 mg Oral 2 times per day Josette Ade, MD   40 mg at 07/16/24 1614   gabapentin  (NEURONTIN ) capsule 300 mg  300 mg Oral TID Bobbitt, Shalon E, NP   300 mg at 07/16/24 1614   hydrOXYzine  (ATARAX ) tablet 25 mg  25 mg Oral TID PRN Bobbitt, Shalon E, NP   25 mg at 07/11/24 1805   magnesium  hydroxide (MILK OF MAGNESIA) suspension 30 mL  30 mL Oral Daily PRN Bobbitt, Shalon E, NP       metoprolol  succinate (TOPROL -XL) 24 hr tablet 25 mg  25 mg Oral BID Josette Ade, MD   25 mg at  07/16/24 0940   multivitamin with minerals tablet 1 tablet  1 tablet Oral Daily Bobbitt, Shalon E, NP   1 tablet at 07/16/24 0940   naproxen  (NAPROSYN ) tablet 375 mg  375 mg Oral TID WC Jadapalle, Sree, MD   375 mg at 07/16/24 1615   OLANZapine  (ZYPREXA ) injection 5 mg  5 mg Intramuscular TID PRN Bobbitt, Shalon E, NP   5 mg at 06/22/24 2259   OLANZapine  zydis (ZYPREXA ) disintegrating tablet 5 mg  5 mg Oral TID PRN Bobbitt, Shalon E, NP   5 mg at 07/11/24 1835   ondansetron  (ZOFRAN ) tablet 4 mg  4 mg Oral Q8H PRN Bobbitt, Shalon E, NP   4 mg at 07/11/24 1835   potassium chloride  SA (KLOR-CON  M) CR tablet 20 mEq  20 mEq Oral BID Bobbitt, Shalon E, NP   20 mEq at 07/16/24 0940   rosuvastatin  (CRESTOR ) tablet 5 mg  5 mg Oral Daily Bobbitt, Shalon E, NP   5 mg at 07/16/24 0940   traZODone  (DESYREL ) tablet 50 mg  50 mg Oral QHS PRN Bobbitt, Shalon E, NP       ziprasidone  (GEODON ) capsule 20 mg  20 mg Oral QHS Jadapalle, Sree, MD   20 mg at 07/15/24 2235   zolpidem  (AMBIEN ) tablet 10 mg  10 mg Oral QHS Jadapalle, Sree, MD   10 mg at 07/15/24 2234    Lab Results:  Results for orders placed or performed during the hospital encounter of 06/19/24 (from the past 48 hours)  Basic metabolic panel     Status: Abnormal   Collection Time: 07/15/24  6:57 AM  Result Value Ref Range   Sodium 139 135 - 145 mmol/L   Potassium 4.0 3.5 - 5.1 mmol/L    Comment: HEMOLYSIS AT THIS LEVEL MAY AFFECT RESULT   Chloride 100 98 - 111 mmol/L   CO2 28 22 - 32 mmol/L   Glucose,  Bld 129 (H) 70 - 99 mg/dL    Comment: Glucose reference range applies only to samples taken after fasting for at least 8 hours.   BUN 15 8 - 23 mg/dL   Creatinine, Ser 9.33 0.44 - 1.00 mg/dL   Calcium  9.2 8.9 - 10.3 mg/dL   GFR, Estimated >39 >39 mL/min    Comment: (NOTE) Calculated using the CKD-EPI Creatinine Equation (2021)    Anion gap 10 5 - 15    Comment: Performed at [facility], [address] [place], [place] [address]  Magnesium      Status: None   Collection Time: 07/15/24  6:57 AM  Result Value Ref Range   Magnesium  2.2 1.7 - 2.4 mg/dL    Comment: Performed at [facility], [address] [place], [place] [address]  Pro Brain natriuretic peptide     Status: None   Collection Time: 07/15/24  6:57 AM  Result Value Ref Range   Pro Brain Natriuretic Peptide 178.0 <300.0 pg/mL    Comment: (NOTE) Age Group        Cut-Points    Interpretation  < 50 years     450 pg/mL       NT-proBNP > 450 pg/mL indicates                                ADHF is likely              50 to 75 years  900 pg/mL  NT-proBNP > 900 pg/mL indicates          ADHF is likely  > 75 years      1800 pg/mL     NT-proBNP > 1800 pg/mL indicates          ADHF is likely                           All ages    Results between       Indeterminate. Further clinical             300 and the cut-   information is needed to determine            point for age group   if ADHF is present.                                                             Elecsys proBNP II/ Elecsys proBNP II STAT           Cut-Point                       Interpretation  300 pg/mL                    NT-proBNP <300pg/mL indicates                             ADHF is not likely  Performed at [facility], [address] [place], [place] [address]         Blood Alcohol level:  Lab Results  Component Value Date   [facility] <15 06/18/2024    Metabolic Disorder Labs: Lab Results  Component Value Date    HGBA1C 5.9 (H) 06/28/2024   MPG 122.63 06/28/2024   No results found for: PROLACTIN Lab Results  Component Value Date   CHOL 156 06/28/2024   TRIG 89 06/28/2024   HDL 70 06/28/2024   CHOLHDL 2.2 06/28/2024   VLDL 18 06/28/2024   LDLCALC 69 06/28/2024    Physical Findings: AIMS:  , ,  ,  ,    CIWA:    COWS:      Psychiatric Specialty Exam:  Presentation  General Appearance:  Casual  Eye Contact: Fleeting  Speech: Normal Rate  Speech Volume: Increased    Mood and Affect  Mood: Euphoric  Affect: Labile   Thought Process  Thought Processes: Disorganized  Orientation:Full (Time, Place and Person)  Thought Content:Illogical; Delusions; Tangential  Hallucinations: Denies  Ideas of Reference:Delusions  Suicidal Thoughts: Denies  Homicidal Thoughts: Denies   Sensorium  Memory: Immediate Fair; Remote Fair  Judgment: Impaired  Insight: Shallow   Executive Functions  Concentration: Poor  Attention Span: Poor  Recall: [employer] of Knowledge: Fair  Language: Fair   Psychomotor Activity  Psychomotor Activity: No data recorded  Musculoskeletal: Strength & Muscle Tone: within normal limits Gait & Station: normal Assets  Assets: Manufacturing Systems Engineer; Desire for Improvement    Physical Exam: Physical Exam Vitals and nursing note reviewed.    ROS Blood pressure (!) 142/90, pulse 94, temperature 97.6 F (36.4 C), resp. rate 15, height 5' 5 (1.651 m), weight 81 kg, SpO2 100%. Body mass  index is 29.7 kg/m.  Diagnosis: Principal Problem:   Bilateral lower extremity edema Active Problems:   Hypercholesteremia   Schizoaffective disorder, bipolar type (HCC)   Chest pain   Sinus tachycardia   Hypotension   Controlled type 2 diabetes mellitus without complication, without long-term current use of insulin (HCC)   Hyponatremia  Schizoaffective disorder bipolar type  Clinical Decision Making: Patient is currently  admitted for worsening psychosis, manic symptoms, tangential grandiosity.  She needs inpatient hospitalization for medication management and close monitoring.  Patient is unable to make decisions on her own as she is unable to appreciate and understand her mental health history, the current need for treatment.  [facility] has screened her case and for further evaluation for legal guardianship. [facility] screened in her case. Sister and mom are involved in her care.  Treatment Plan Summary:  Safety and Monitoring:             -- inoluntary admission to inpatient psychiatric unit for safety, stabilization and treatment             -- Daily contact with patient to assess and evaluate symptoms and progress in treatment             -- Patient's case to be discussed in multi-disciplinary team meeting             -- Observation Level: q15 minute checks             -- Vital signs:  q12 hours             -- Precautions: suicide, elopement, and assault   2. Psychiatric Diagnoses and Treatment:  Discontinue Depakote  as patient is noncompliant ContGeodon 20 mg nightly as patient is agreeable to take Geodon  Clozaril  100 mg twice daily enforced with zyprexa  10mg  BID- Cymbalta  40 mg daily   -- The risks/benefits/side-effects/alternatives to this medication were discussed in detail with the patient and time was given for questions. The patient consents to medication trial.                -- Metabolic profile and EKG monitoring obtained while on an atypical antipsychotic (BMI: Lipid Panel: HbgA1c: QTc:)              -- Encouraged patient to participate in unit milieu and in scheduled group therapies                            3. Medical Issues Being Addressed:   Chest pain Sinus tachycardia HTN (hypertension) Bilateral lower extremity edema Hypercholesteremia  12/30:  Troponin negative x 2. Echocardiogram done with pending results.  Small improvement in lower extremity edema so continuing p.o. Lasix .  Will need  BMP every other day.  4. Discharge Planning:   -- Social work and case management to assist with discharge planning and identification of hospital follow-up needs prior to discharge  -- Estimated LOS: 3-4 days  Millie JONELLE Manners, MD 07/16/2024, 6:01 PM

## 2024-07-16 NOTE — Progress Notes (Signed)
 Behavior:   Cooperative.  Loud and disruptive in milieu (yelling out and singing).     Psych assessment: Denies SI/HI and AVH.  Interaction / Group attendance:  Present in milieu.  Did not attend groups. Intrusive with peers.   Medication/ PRNs: Compliant.    Pain: Denies.  15 min checks in place for safety.

## 2024-07-16 NOTE — Progress Notes (Signed)
 " Progress Note   Patient: Valerie [name] FMW:[id number] DOB: January 31, 1961 DOA: 06/19/2024     27 DOS: the patient was seen and examined on 07/16/2024   Brief hospital course: Malayja Freund is a 64 y.o. female with past medical history of schizophrenia and bipolar disorder who was admitted at geropsych facility due to manic episode.  She was complaining of some intermittent chest pain, mostly involving upper retrosternal area, intermittent, no relationship with activity and no radiation.  No associated factors.  Sometimes pain increased with moving around her arms, worsening bilateral lower extremity edema, denies any shortness of breath orthopnea or PND.   Psych was also concerned about her fluctuating blood pressure going from borderline soft to little high. Psych was making some changes to her home medications.   12/30: Vitals and labs stable.  Troponin negative x 2. Echocardiogram done with pending results.  Small improvement in lower extremity edema so continuing p.o. Lasix .  Will need BMP every other day. 12/31.  Held Lasix  and lisinopril  secondary to low blood pressure.  Switched propranolol  over to Toprol -XL twice daily. 07/13/2024.  Blood pressure improved.  Will increase Lasix  to 40 mg orally twice daily for now.  Continue Toprol  XL. 1/2.  Continue Lasix  40 mg twice a day. 1/3.  Change diabetic medications over to Farxiga .  proBNP negative. 1/4.  Patient upset about some of the residents.  Assessment and Plan: * Bilateral lower extremity edema Continue Lasix  40 mg p.o. twice daily.  Continue Toprol -XL.  Unable to do compression stockings on the psychiatric floor.  Added Farxiga .  proBNP negative.  Check BMP tomorrow.  Sinus tachycardia Heart rate improved with changing propranolol  over to Toprol -XL twice daily.  Hypotension Resolved.  Chest pain Cardiac enzymes negative.  Echocardiogram showed EF of 65%.  Likely GERD.  Patient on famotidine   Hypercholesteremia Continue home  Crestor   Hyponatremia Sodium normalized  Controlled type 2 diabetes mellitus without complication, without long-term current use of insulin (HCC) Change diabetes medication over to Farxiga  and get rid of metformin  and Tradjenta .  Last hemoglobin A1c 5.9  Schizoaffective disorder, bipolar type (HCC) Patient on Geodon , trazodone , Cymbalta , clozapine , gabapentin .  Spoke with nursing staff and team about what she mentioned to me today.        Subjective: Patient mention to me she can't kill anyone or be killed.  I tried to specify what this was.  Tenderness and she is upset about some of the residents down on the psychiatric floor.  No homicidal ideation.  Swelling getting a little bit better.  Physical Exam: Vitals:   07/15/24 0738 07/15/24 1939 07/15/24 2234 07/16/24 0724  BP: 127/76 (!) 121/58 (!) 121/58 (!) 142/90  Pulse: 99 83 83 94  Resp: 18 17  15   Temp: 98.7 F (37.1 C) (!) 97.5 F (36.4 C)  97.6 F (36.4 C)  TempSrc:      SpO2: 100% 100%  100%  Weight:      Height:       Physical Exam HENT:     Head: Normocephalic.  Eyes:     General: Lids are normal.     Conjunctiva/sclera: Conjunctivae normal.  Cardiovascular:     Rate and Rhythm: Normal rate and regular rhythm.     Heart sounds: Normal heart sounds, S1 normal and S2 normal.  Pulmonary:     Breath sounds: Examination of the right-lower field reveals decreased breath sounds. Examination of the left-lower field reveals decreased breath sounds. Decreased breath sounds present. No wheezing, rhonchi  or rales.  Abdominal:     Palpations: Abdomen is soft.     Tenderness: There is no abdominal tenderness.  Musculoskeletal:     Right lower leg: Swelling present.     Left lower leg: Swelling present.  Skin:    General: Skin is warm.     Findings: No rash.  Neurological:     Mental Status: She is alert.     Data Reviewed: No new data today  Family Communication: As per psychiatric team  Disposition: As per  psychiatric team  Planned Discharge Destination: To be determined    Time spent: 27 minutes  Author: Charlie Patterson, MD 07/16/2024 12:32 PM  For on call review [email or web address].  "

## 2024-07-16 NOTE — Group Note (Signed)
 Date:  07/16/2024 Time:  9:00 PM  Group Topic/Focus:  Wrap-Up Group:   The focus of this group is to help patients review their daily goal of treatment and discuss progress on daily workbooks.    Participation Level:  Active  Participation Quality:  Appropriate  Affect:  Blunted  Cognitive:  Alert  Insight: Appropriate  Engagement in Group:  Lacking  Modes of Intervention:  Discussion  Additional Comments:    Valerie [name] 07/16/2024, 9:00 PM

## 2024-07-16 NOTE — Plan of Care (Signed)
" °  Problem: Activity: Goal: Interest or engagement in activities will improve Outcome: Progressing Goal: Sleeping patterns will improve Outcome: Progressing   Problem: Education: Goal: Verbalization of understanding the information provided will improve Outcome: Not Progressing   Problem: Coping: Goal: Ability to demonstrate self-control will improve Outcome: Not Progressing   "

## 2024-07-16 NOTE — Group Note (Signed)
 Date:  07/16/2024 Time:  2:17 PM  Group Topic/Focus:  Goals Group:   The focus of this group is to help patients establish daily goals to achieve during treatment and discuss how the patient can incorporate goal setting into their daily lives to aide in recovery.    Participation Level:  Did Not Attend   Larrie [name] BRAVO 07/16/2024, 2:17 PM

## 2024-07-16 NOTE — Plan of Care (Signed)
" °  Problem: Education: Goal: Ability to state activities that reduce stress will improve Outcome: Progressing   Problem: Self-Concept: Goal: Ability to identify factors that promote anxiety will improve Outcome: Progressing Goal: Level of anxiety will decrease Outcome: Progressing Goal: Ability to modify response to factors that promote anxiety will improve Outcome: Progressing   Problem: Activity: Goal: Will verbalize the importance of balancing activity with adequate rest periods Outcome: Progressing   "

## 2024-07-16 NOTE — Group Note (Signed)
 Date:  07/16/2024 Time:  3:54 PM  Group Topic/Focus:  Wellness Toolbox:   The focus of this group is to discuss various aspects of wellness, balancing those aspects and exploring ways to increase the ability to experience wellness.  Patients will create a wellness toolbox for use upon discharge.    Participation Level:  Did Not Attend   Larrie [name] BRAVO 07/16/2024, 3:54 PM

## 2024-07-16 NOTE — Progress Notes (Signed)
 SI/HI: denies Behavior/Mood: Pt remains irritable and easily agitated. Labile mood. Loud and pressured speech. No behavior issues noted.  Interaction/Group: visible in dayroom playing cards, independently. Partial participating in group.  Medications/PRNs: po med compliant. No PRNs given. Pain: denies Other: BS qAM. Slept 7 hours   07/15/24 2100  Psych Admission Type (Psych Patients Only)  Admission Status Involuntary  Psychosocial Assessment  Patient Complaints None  Eye Contact Brief  Facial Expression Animated  Affect Labile  Speech Pressured;Loud  Interaction Assertive  Motor Activity Slow  Appearance/Hygiene Unremarkable;Disheveled  Behavior Characteristics Appropriate to situation  Mood Labile  Thought Process  Coherency Loose associations  Content Preoccupation  Delusions Grandeur  Perception WDL  Hallucination None reported or observed  Judgment Impaired  Confusion Mild  Danger to Self  Current suicidal ideation? Denies

## 2024-07-17 DIAGNOSIS — E7800 Pure hypercholesterolemia, unspecified: Secondary | ICD-10-CM | POA: Diagnosis not present

## 2024-07-17 DIAGNOSIS — F250 Schizoaffective disorder, bipolar type: Secondary | ICD-10-CM | POA: Diagnosis not present

## 2024-07-17 DIAGNOSIS — I9589 Other hypotension: Secondary | ICD-10-CM | POA: Diagnosis not present

## 2024-07-17 DIAGNOSIS — R600 Localized edema: Secondary | ICD-10-CM | POA: Diagnosis not present

## 2024-07-17 DIAGNOSIS — R000 Tachycardia, unspecified: Secondary | ICD-10-CM | POA: Diagnosis not present

## 2024-07-17 DIAGNOSIS — E119 Type 2 diabetes mellitus without complications: Secondary | ICD-10-CM | POA: Diagnosis not present

## 2024-07-17 DIAGNOSIS — R0789 Other chest pain: Secondary | ICD-10-CM | POA: Diagnosis not present

## 2024-07-17 LAB — GLUCOSE, CAPILLARY: Glucose-Capillary: 112 mg/dL — ABNORMAL HIGH (ref 70–99)

## 2024-07-17 LAB — BASIC METABOLIC PANEL WITH GFR
Anion gap: 8 (ref 5–15)
BUN: 14 mg/dL (ref 8–23)
CO2: 32 mmol/L (ref 22–32)
Calcium: 9.6 mg/dL (ref 8.9–10.3)
Chloride: 99 mmol/L (ref 98–111)
Creatinine, Ser: 0.72 mg/dL (ref 0.44–1.00)
GFR, Estimated: >60 mL/min
Glucose, Bld: 116 mg/dL — ABNORMAL HIGH (ref 70–99)
Potassium: 4.1 mmol/L (ref 3.5–5.1)
Sodium: 139 mmol/L (ref 135–145)

## 2024-07-17 LAB — URIC ACID: Uric Acid, Serum: 3.6 mg/dL (ref 2.5–7.1)

## 2024-07-17 NOTE — Group Note (Signed)
 Physical/Occupational Therapy Group Note  Group Topic: Functional, Dynamic Balance   Group Date: 07/17/2024 Start Time: 1300 End Time: 1330 Facilitators: Thedore Pickel, Alm Hamilton, PT   Group Description: Group discussed impact of balance on safety and independence with functional tasks.  Identified and discussed any self-perceived balance deficits to personalize information.  Discussed and reviewed strategies to address/improve balance deficits: use of assist devices, activity pacing/energy conservation, environment/home safety modifications, focusing attention/minimizing distraction.  Reviewed and participated with standing LE therex designed to target dynamic balance reactions and LE strength/stability; provided handouts with HEP to be utilized outside of group time as appropriate.  Allowed time for questions and further discussion on any balance or mobility concerns/needs.  Therapeutic Goal(s):  Identify and discuss any individual balance deficits and functional implications. Identify and discuss any environmental/home safety modifications that can optimize balance and safety for mobility within the home. Demonstrate understanding and performance of standing therex designed to target dynamic balance deficits.  Individual Participation: Pt actively participated with the discussion and physical activity components of the session but left around 1:15 pm.  For the first half of the session prior to excusing herself pt was very pleasant and engaged.    Participation Level: Active and Engaged   Participation Quality: Minimal Cues   Behavior: Appropriate   Speech/Thought Process: Organized   Affect/Mood: Happy   Insight: Moderate   Judgement: Moderate   Modes of Intervention: Activity, Discussion, and Education  Patient Response to Interventions:  Attentive and Engaged   Plan: Continue to engage patient in PT/OT groups 1 - 2x/week.  CHARM Hamilton Bertin PT, DPT 07/17/2024, 2:00 PM

## 2024-07-17 NOTE — Group Note (Signed)
 Recreation Therapy Group Note   Group Topic:Stress Management  Group Date: 07/17/2024 Start Time: 1415 End Time: 1440 Facilitators: Celestia Jeoffrey BRAVO, LRT, CTRS Location: Dayroom  Group Description: Meditation. LRT asks patients their current level of stress/anxiety from 1-10, with 10 being the highest. LRT educated on the benefits of meditation and relaxation techniques, and how it can apply to everyday life post-discharge. LRT and pt's followed along to an audio script of a guided meditation video. LRT asked pt their level of stress and anxiety once the prompt was finished. LRT facilitated post-activity processing to gain feedback on session.   Goal Area(s) Addressed:  Patient will practice using relaxation technique. Patient will identify a new coping skill.  Patient will follow multistep directions to reduce anxiety and stress.   Affect/Mood: N/A   Participation Level: Did not attend    Clinical Observations/Individualized Feedback: Patient did not attend.  Plan: Continue to engage patient in RT group sessions 2-3x/week.   Jeoffrey BRAVO Celestia, LRT, CTRS 07/17/2024 4:53 PM

## 2024-07-17 NOTE — Plan of Care (Signed)
   Problem: Coping: Goal: Ability to verbalize frustrations and anger appropriately will improve Outcome: Progressing

## 2024-07-17 NOTE — Plan of Care (Signed)
   Problem: Education: Goal: Knowledge of [facility] Education information/materials will improve Outcome: Progressing Goal: Verbalization of understanding the information provided will improve Outcome: Progressing

## 2024-07-17 NOTE — Progress Notes (Signed)
 [facility] MD Progress Note   5:15 PM Valerie [name]  MRN:  [id number]   Valerie [name] is a 64 year old female presenting voluntary to [facility] requesting a psychiatric evaluation for worsening psychosis. Patient has history of schizophrenia. Patient denied SI, HI, psychosis and alcohol/drug usage. Patient resides at [facility]. Patient reports people are coming by my door talking loud, yelling and screaming and coming in my room with keys. Patient reports having multiple medical Afibs and blood clots due to this. Patient reports people are doing things in the dining room. When asked if she called EMS, patient stated I called EMS, I stripped searched over the phone and called the authorities. Patient is admitted to [facility] unit with Q15 min safety monitoring. Multidisciplinary team approach is offered. Medication management; group/milieu therapy is offered.   Sister Celinda Dethlefs : [phone] called on 07/07/24 went to generic voicemail-sister called back and provider discussed at length the current treatment plan of patient being on Clozaril -intermittently refusing, enforced with Zyprexa  IM and recent and addition of Geodon .  Due to patient's ongoing poor response to medications and ongoing delusions discussed the plan of switching Clozaril  to another antipsychotic that comes in LAI form.  Clozaril  may not be the best option for patients who are noncompliant and refusing labs.  Sister is agreeable with the plan  Subjective:  Chart reviewed, case discussed in multidisciplinary meeting, patient seen during rounds.   Patient is noted to be talking to other peers in the day area.  She came to her room to talk to the provider.  She offers no complaints.  She remains delusional and talks about wanting a new life outside.  She is unable to identify or acknowledge her family members including her sister and mother.  Patient is taking her medications with no reported side effect.   Discussed in the treatment team about discussing possible legal guardianship through the family or through APS. Past Psychiatric History: see h&P Family History:  Family History  Problem Relation Age of Onset   Colon cancer Neg Hx    Celiac disease Neg Hx    Inflammatory bowel disease Neg Hx    Social History:  Social History   Substance and Sexual Activity  Alcohol Use Never     Social History   Substance and Sexual Activity  Drug Use Never    Social History   Socioeconomic History   Marital status: Single    Spouse name: Not on file   Number of children: Not on file   Years of education: Not on file   Highest education level: Not on file  Occupational History   Not on file  Tobacco Use   Smoking status: Never   Smokeless tobacco: Never  Substance and Sexual Activity   Alcohol use: Never   Drug use: Never   Sexual activity: Not Currently    Birth control/protection: Pill  Other Topics Concern   Not on file  Social History Narrative   Not on file   Social Drivers of Health   Tobacco Use: Low Risk (06/19/2024)   Patient History    Smoking Tobacco Use: Never    Smokeless Tobacco Use: Never    Passive Exposure: Not on file  Financial Resource Strain: Not on file  Food Insecurity: Unknown (06/19/2024)   Epic    Worried About Programme Researcher, Broadcasting/film/video in the Last Year: Never true    [employer] of Food in the Last Year: Patient declined  Transportation Needs: No Transportation Needs (06/19/2024)   Epic    Lack of Transportation (Medical): No    Lack of Transportation (Non-Medical): No  Physical Activity: Not on file  Stress: Not on file  Social Connections: Not on file  Depression (PHQ2-9): Low Risk (12/21/2022)   Depression (PHQ2-9)    PHQ-2 Score: 0  Alcohol Screen: Low Risk (06/19/2024)   Alcohol Screen    Last Alcohol Screening Score (AUDIT): 0  Housing: Low Risk (06/19/2024)   Epic    Unable to Pay for Housing in the Last Year: No    Number of Times Moved in the  Last Year: 0    Homeless in the Last Year: No  Utilities: Not At Risk (06/19/2024)   Epic    Threatened with loss of utilities: No  Health Literacy: Not on file   Past Medical History:  Past Medical History:  Diagnosis Date   Chronic lower extremity pain (2ry area of Pain) (Right) 12/21/2022   Chronic pain syndrome 12/20/2022   DDD (degenerative disc disease), 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 Tobie: Pancolonic diverticulosis, tortuous colon, next colonoscopy 2024   TONSILLECTOMY      Current Medications: Current Facility-Administered Medications  Medication Dose Route Frequency Provider Last Rate Last Admin   acetaminophen  (TYLENOL ) tablet 650 mg  650 mg Oral Q6H PRN Onuoha, Chinwendu V, NP   650 mg at 07/11/24 1835   aspirin  EC tablet 81 mg  81 mg Oral Daily Bobbitt, Shalon E, NP   81 mg at 07/17/24 0941   cholecalciferol  (VITAMIN D3) 25 MCG (1000 UNIT) tablet 1,000 Units  1,000 Units Oral Weekly Saori Umholtz, MD   1,000 Units at 07/04/24 1201   cloZAPine  (CLOZARIL ) tablet 100 mg  100 mg Oral BID Amalia Edgecombe, MD   100 mg at 07/17/24 9058   Or   OLANZapine  (ZYPREXA ) injection 10 mg  10 mg Intramuscular BID Jayme Cham, MD   10 mg at 07/05/24 2233   dapagliflozin  propanediol (FARXIGA ) tablet 10 mg  10 mg Oral Daily Josette Ade, MD   10 mg at 07/17/24 0941   diclofenac  Sodium (VOLTAREN ) 1 % topical gel 4 g  4 g Topical QID PRN Madaram, Kondal R, MD       DULoxetine  (CYMBALTA ) DR capsule 40 mg  40 mg Oral Daily Shemeika Starzyk, MD   40 mg at 07/17/24 0941   famotidine  (PEPCID ) tablet 40 mg  40 mg Oral Daily Shrivastava, Aryendra, MD   40 mg at 07/17/24 0941   fluticasone  (FLONASE ) 50 MCG/ACT nasal spray 1 spray  1 spray Each Nare Daily PRN Madaram, Kondal R, MD       furosemide  (LASIX ) tablet 40 mg  40 mg Oral 2 times  per day Josette Ade, MD   40 mg at 07/17/24 1702   gabapentin  (NEURONTIN ) capsule 300 mg  300 mg Oral TID Bobbitt, Shalon E, NP   300 mg at 07/17/24 1702   hydrOXYzine  (ATARAX ) tablet 25 mg  25 mg Oral TID PRN Bobbitt, Shalon E, NP   25 mg at 07/11/24 1805   magnesium  hydroxide (MILK OF MAGNESIA) suspension 30 mL  30 mL Oral Daily PRN Bobbitt, Shalon E, NP       metoprolol  succinate (TOPROL -XL) 24 hr tablet 25 mg  25 mg Oral BID  Josette Ade, MD   25 mg at 07/17/24 0941   multivitamin with minerals tablet 1 tablet  1 tablet Oral Daily Bobbitt, Shalon E, NP   1 tablet at 07/17/24 0941   naproxen  (NAPROSYN ) tablet 375 mg  375 mg Oral TID WC Winnifred Dufford, MD   375 mg at 07/17/24 1702   OLANZapine  (ZYPREXA ) injection 5 mg  5 mg Intramuscular TID PRN Bobbitt, Shalon E, NP   5 mg at 06/22/24 2259   OLANZapine  zydis (ZYPREXA ) disintegrating tablet 5 mg  5 mg Oral TID PRN Bobbitt, Shalon E, NP   5 mg at 07/11/24 1835   ondansetron  (ZOFRAN ) tablet 4 mg  4 mg Oral Q8H PRN Bobbitt, Shalon E, NP   4 mg at 07/11/24 1835   potassium chloride  SA (KLOR-CON  M) CR tablet 20 mEq  20 mEq Oral BID Bobbitt, Shalon E, NP   20 mEq at 07/17/24 0941   rosuvastatin  (CRESTOR ) tablet 5 mg  5 mg Oral Daily Bobbitt, Shalon E, NP   5 mg at 07/17/24 9057   traZODone  (DESYREL ) tablet 50 mg  50 mg Oral QHS PRN Bobbitt, Shalon E, NP       ziprasidone  (GEODON ) capsule 20 mg  20 mg Oral QHS Anwen Cannedy, MD   20 mg at 07/16/24 2200   zolpidem  (AMBIEN ) tablet 10 mg  10 mg Oral QHS Sun Wilensky, MD   10 mg at 07/16/24 2127    Lab Results:  Results for orders placed or performed during the hospital encounter of 06/19/24 (from the past 48 hours)  Glucose, capillary     Status: Abnormal   Collection Time: 07/17/24  6:20 AM  Result Value Ref Range   Glucose-Capillary 112 (H) 70 - 99 mg/dL    Comment: Glucose reference range applies only to samples taken after fasting for at least 8 hours.  Basic metabolic panel      Status: Abnormal   Collection Time: 07/17/24  9:30 AM  Result Value Ref Range   Sodium 139 135 - 145 mmol/L   Potassium 4.1 3.5 - 5.1 mmol/L   Chloride 99 98 - 111 mmol/L   CO2 32 22 - 32 mmol/L   Glucose, Bld 116 (H) 70 - 99 mg/dL    Comment: Glucose reference range applies only to samples taken after fasting for at least 8 hours.   BUN 14 8 - 23 mg/dL   Creatinine, Ser 9.27 0.44 - 1.00 mg/dL   Calcium  9.6 8.9 - 10.3 mg/dL   GFR, Estimated >39 >39 mL/min    Comment: (NOTE) Calculated using the CKD-EPI Creatinine Equation (2021)    Anion gap 8 5 - 15    Comment: Performed at [facility], [address] [place], [place] [address]  Uric acid     Status: None   Collection Time: 07/17/24  9:30 AM  Result Value Ref Range   Uric Acid, Serum 3.6 2.5 - 7.1 mg/dL    Comment: Performed at [facility], [address] [place], [place] [address]        Blood Alcohol level:  Lab Results  Component Value Date   [facility] <15 06/18/2024    Metabolic Disorder Labs: Lab Results  Component Value Date   HGBA1C 5.9 (H) 06/28/2024   MPG 122.63 06/28/2024   No results found for: PROLACTIN Lab Results  Component Value Date   CHOL 156 06/28/2024   TRIG 89 06/28/2024   HDL 70 06/28/2024   CHOLHDL 2.2 06/28/2024  VLDL 18 06/28/2024   LDLCALC 69 06/28/2024    Physical Findings: AIMS:  , ,  ,  ,    CIWA:    COWS:      Psychiatric Specialty Exam:  Presentation  General Appearance:  Casual  Eye Contact: Fleeting  Speech: Normal Rate  Speech Volume: Increased    Mood and Affect  Mood: Euphoric  Affect: Labile   Thought Process  Thought Processes: Disorganized  Orientation:Full (Time, Place and Person)  Thought Content:Illogical; Delusions; Tangential  Hallucinations: Denies  Ideas of Reference:Delusions  Suicidal Thoughts: Denies  Homicidal Thoughts: Denies   Sensorium  Memory: Immediate Fair; Remote  Fair  Judgment: Impaired  Insight: Shallow   Executive Functions  Concentration: Poor  Attention Span: Poor  Recall: [employer] of Knowledge: Fair  Language: Fair   Psychomotor Activity  Psychomotor Activity: No data recorded  Musculoskeletal: Strength & Muscle Tone: within normal limits Gait & Station: normal Assets  Assets: Manufacturing Systems Engineer; Desire for Improvement    Physical Exam: Physical Exam Vitals and nursing note reviewed.    ROS Blood pressure 123/61, pulse 90, temperature 97.8 F (36.6 C), resp. rate 17, height 5' 5 (1.651 m), weight 81 kg, SpO2 100%. Body mass index is 29.7 kg/m.  Diagnosis: Principal Problem:   Bilateral lower extremity edema Active Problems:   Hypercholesteremia   Schizoaffective disorder, bipolar type (HCC)   Chest pain   Sinus tachycardia   Hypotension   Controlled type 2 diabetes mellitus without complication, without long-term current use of insulin (HCC)   Hyponatremia  Schizoaffective disorder bipolar type  Clinical Decision Making: Patient is currently admitted for worsening psychosis, manic symptoms, tangential grandiosity.  She needs inpatient hospitalization for medication management and close monitoring.  Patient is unable to make decisions on her own as she is unable to appreciate and understand her mental health history, the current need for treatment.  [facility] has screened her case and for further evaluation for legal guardianship. APS screened in her case. Sister and mom are involved in her care.  Treatment Plan Summary:  Safety and Monitoring:             -- inoluntary admission to inpatient psychiatric unit for safety, stabilization and treatment             -- Daily contact with patient to assess and evaluate symptoms and progress in treatment             -- Patient's case to be discussed in multi-disciplinary team meeting             -- Observation Level: q15 minute checks             -- Vital  signs:  q12 hours             -- Precautions: suicide, elopement, and assault   2. Psychiatric Diagnoses and Treatment:  Discontinue Depakote  as patient is noncompliant ContGeodon 20 mg nightly as patient is agreeable to take Geodon  Clozaril  100 mg twice daily enforced with zyprexa  10mg  BID- Cymbalta  40 mg daily   -- The risks/benefits/side-effects/alternatives to this medication were discussed in detail with the patient and time was given for questions. The patient consents to medication trial.                -- Metabolic profile and EKG monitoring obtained while on an atypical antipsychotic (BMI: Lipid Panel: HbgA1c: QTc:)              -- Encouraged  patient to participate in unit milieu and in scheduled group therapies                            3. Medical Issues Being Addressed:   Chest pain Sinus tachycardia HTN (hypertension) Bilateral lower extremity edema Hypercholesteremia  12/30:  Troponin negative x 2. Echocardiogram done with pending results.  Small improvement in lower extremity edema so continuing p.o. Lasix .  Will need BMP every other day.  4. Discharge Planning:   -- Social work and case management to assist with discharge planning and identification of hospital follow-up needs prior to discharge  -- Estimated LOS: 3-4 days  Azaria Stegman, MD 07/17/2024, 5:15 PM

## 2024-07-17 NOTE — Group Note (Signed)
 Date:  07/17/2024 Time:  9:01 PM  Group Topic/Focus:  Wrap-Up Group:   The focus of this group is to help patients review their daily goal of treatment and discuss progress on daily workbooks.    Participation Level:  Active  Participation Quality:  Appropriate  Affect:  Appropriate  Cognitive:  Alert  Insight: Appropriate  Engagement in Group:  Engaged  Modes of Intervention:  Discussion  Additional Comments:    Tommas CHRISTELLA Bunker 07/17/2024, 9:01 PM

## 2024-07-17 NOTE — Progress Notes (Signed)
 " Progress Note   Patient: Valerie [name] FMW:[id number] DOB: 1961/01/04 DOA: 06/19/2024     28 DOS: the patient was seen and examined on 07/17/2024   Brief hospital course: Terre Hanneman is a 64 y.o. female with past medical history of schizophrenia and bipolar disorder who was admitted at geropsych facility due to manic episode.  She was complaining of some intermittent chest pain, mostly involving upper retrosternal area, intermittent, no relationship with activity and no radiation.  No associated factors.  Sometimes pain increased with moving around her arms, worsening bilateral lower extremity edema, denies any shortness of breath orthopnea or PND.   Psych was also concerned about her fluctuating blood pressure going from borderline soft to little high. Psych was making some changes to her home medications.   12/30: Vitals and labs stable.  Troponin negative x 2. Echocardiogram done with pending results.  Small improvement in lower extremity edema so continuing p.o. Lasix .  Will need BMP every other day. 12/31.  Held Lasix  and lisinopril  secondary to low blood pressure.  Switched propranolol  over to Toprol -XL twice daily. 07/13/2024.  Blood pressure improved.  Will increase Lasix  to 40 mg orally twice daily for now.  Continue Toprol  XL. 1/2.  Continue Lasix  40 mg twice a day. 1/3.  Change diabetic medications over to Farxiga .  proBNP negative. 1/4.  Patient upset about some of the residents. 1/5.  Continue Lasix  twice a day.  Assessment and Plan: * Bilateral lower extremity edema Continue Lasix  40 mg p.o. twice daily.  Continue Toprol -XL.  Unable to do compression stockings on the psychiatric floor.  Continue Farxiga .  proBNP negative.  Check BMP every other day.  Hopefully can go down to once a day of the Lasix  next day or so.  Sinus tachycardia Heart rate improved with changing propranolol  over to Toprol -XL twice daily.  Hypotension Resolved.  Chest pain Cardiac enzymes negative.   Echocardiogram showed EF of 65%.  Likely GERD.  Patient on famotidine   Hypercholesteremia Continue home Crestor   Hyponatremia Sodium a few points less than the normal range.  Controlled type 2 diabetes mellitus without complication, without long-term current use of insulin (HCC) Change diabetes medication over to Farxiga  and get rid of metformin  and Tradjenta .  Last hemoglobin A1c 5.9  Schizoaffective disorder, bipolar type (HCC) Patient on Geodon , trazodone , clozapine , gabapentin .        Subjective: Patient feels okay.  Offers no complaints.  Following for lower extremity edema.  Admitted to the psychiatry unit 28 days ago.  Physical Exam: Vitals:   07/16/24 1911 07/16/24 2132 07/16/24 2132 07/17/24 0724  BP: (!) 122/46 118/70 116/70 123/61  Pulse: 89  100 90  Resp: 14  14 17   Temp: (!) 97.5 F (36.4 C)  (!) 97.3 F (36.3 C) 97.8 F (36.6 C)  TempSrc:      SpO2: 99%  100% 100%  Weight:      Height:       Physical Exam HENT:     Head: Normocephalic.  Eyes:     General: Lids are normal.     Conjunctiva/sclera: Conjunctivae normal.  Cardiovascular:     Rate and Rhythm: Normal rate and regular rhythm.     Heart sounds: Normal heart sounds, S1 normal and S2 normal.  Pulmonary:     Breath sounds: No decreased breath sounds, wheezing, rhonchi or rales.  Musculoskeletal:     Right lower leg: Swelling present.     Left lower leg: Swelling present.  Skin:  General: Skin is warm.     Findings: No rash.  Neurological:     Mental Status: She is alert.     Data Reviewed:   Family Communication: as per psych team  Disposition: As per psych team  Planned Discharge Destination: as per psych team    Time spent: 25 minutes  Author: Charlie Patterson, MD 07/17/2024 12:16 PM  For on call review [email or web address]  "

## 2024-07-17 NOTE — Group Note (Unsigned)
 Date:  07/17/2024 Time:  11:59 AM  Group Topic/Focus:  Goals Group:   The focus of this group is to help patients establish daily goals to achieve during treatment and discuss how the patient can incorporate goal setting into their daily lives to aide in recovery.     Participation Level:  {BHH PARTICIPATION OZCZO:77735}  Participation Quality:  {BHH PARTICIPATION QUALITY:22265}  Affect:  {BHH AFFECT:22266}  Cognitive:  {BHH COGNITIVE:22267}  Insight: {BHH Insight2:20797}  Engagement in Group:  {BHH ENGAGEMENT IN HMNLE:77731}  Modes of Intervention:  {BHH MODES OF INTERVENTION:22269}  Additional Comments:  ***  Maglione,Yanet Balliet E 07/17/2024, 11:59 AM

## 2024-07-18 LAB — GLUCOSE, CAPILLARY: Glucose-Capillary: 110 mg/dL — ABNORMAL HIGH (ref 70–99)

## 2024-07-18 NOTE — Progress Notes (Signed)
 " Progress Note   Patient: Valerie [name] FMW:[id number] DOB: 03-14-1961 DOA: 06/19/2024     29 DOS: the patient was seen and examined on 07/18/2024   Brief hospital course: Valerie [name] is a 64 y.o. female with past medical history of schizophrenia and bipolar disorder who was admitted at geropsych facility due to manic episode.  She was complaining of some intermittent chest pain, mostly involving upper retrosternal area, intermittent, no relationship with activity and no radiation.  No associated factors.  Sometimes pain increased with moving around her arms, worsening bilateral lower extremity edema, denies any shortness of breath orthopnea or PND.   Psych was also concerned about her fluctuating blood pressure going from borderline soft to little high. Psych was making some changes to her home medications.   12/30: Vitals and labs stable.  Troponin negative x 2. Echocardiogram done with pending results.  Small improvement in lower extremity edema so continuing p.o. Lasix .  Will need BMP every other day. 12/31.  Held Lasix  and lisinopril  secondary to low blood pressure.  Switched propranolol  over to Toprol -XL twice daily. 07/13/2024.  Blood pressure improved.  Will increase Lasix  to 40 mg orally twice daily for now.  Continue Toprol  XL. 1/2.  Continue Lasix  40 mg twice a day. 1/3.  Change diabetic medications over to Farxiga .  proBNP negative. 1/4.  Patient upset about some of the residents. 1/5.  Continue Lasix  twice a day. 1/6.  Patient wants to continue Lasix  twice a day.  I offered to decrease it down to once a day dosing.  Assessment and Plan: * Bilateral lower extremity edema Continue Lasix  40 mg p.o. twice daily at patient request.  Continue Toprol -XL.  Unable to do compression stockings on the psychiatric floor.  Sonogram left lower extremity negative than earlier in the hospital course.  Continue Farxiga .  proBNP negative.  Check BMP tomorrow.  Sinus tachycardia Heart rate  improved with changing propranolol  over to Toprol -XL twice daily.  Hypotension Resolved.  Chest pain Cardiac enzymes negative.  Echocardiogram showed EF of 65%.  Likely GERD.  Patient on famotidine   Hypercholesteremia Continue home Crestor   Hyponatremia Sodium a few points less than the normal range.  Controlled type 2 diabetes mellitus without complication, without long-term current use of insulin (HCC) Change diabetes medication over to Farxiga  and get rid of metformin  and Tradjenta .  Last hemoglobin A1c 5.9  Schizoaffective disorder, bipolar type (HCC) Patient on Geodon , trazodone , clozapine , gabapentin .        Subjective: Patient feels okay.  Offers no complaints.  She wants to keep the Lasix  going twice a day.  Physical Exam: Vitals:   07/17/24 1815 07/17/24 1912 07/18/24 0737 07/18/24 0943  BP: (!) 160/75 114/67 138/74   Pulse: 70 96 (!) 53 67  Resp: 17 14 17    Temp: 97.7 F (36.5 C) (!) 97.3 F (36.3 C) 97.9 F (36.6 C)   TempSrc:      SpO2: 95% 100% 100%   Weight:      Height:       Physical Exam HENT:     Head: Normocephalic.  Eyes:     General: Lids are normal.     Conjunctiva/sclera: Conjunctivae normal.  Cardiovascular:     Rate and Rhythm: Normal rate and regular rhythm.     Heart sounds: Normal heart sounds, S1 normal and S2 normal.  Pulmonary:     Breath sounds: No decreased breath sounds, wheezing, rhonchi or rales.  Musculoskeletal:     Right lower leg: Swelling  present.     Left lower leg: Swelling present.  Skin:    General: Skin is warm.     Findings: No rash.  Neurological:     Mental Status: She is alert.     Data Reviewed: Creatinine 0.72, electrolytes normal range, uric acid 3.6  Family Communication: As per psychiatry team  Disposition: As per psychiatry team  Planned Discharge Destination: As per psychiatry team    Time spent: 28 minutes  Author: Charlie Patterson, MD 07/18/2024 1:57 PM  For on call review  [email or web address]  "

## 2024-07-18 NOTE — Group Note (Signed)
 Date:  07/18/2024 Time:  9:09 PM  Group Topic/Focus:  Wrap-Up Group:   The focus of this group is to help patients review their daily goal of treatment and discuss progress on daily workbooks.    Participation Level:  Active  Participation Quality:  Attentive  Affect:  Appropriate  Cognitive:  Alert  Insight: Appropriate  Engagement in Group:  Engaged  Modes of Intervention:  Discussion  Additional Comments:    Cecilio GORMAN Getting 07/18/2024, 9:09 PM

## 2024-07-18 NOTE — Group Note (Signed)
 Recreation Therapy Group Note   Group Topic:Animal Assisted Therapy   Group Date: 07/18/2024 Start Time: 1020 End Time: 1045 Facilitators: Celestia Jeoffrey BRAVO, LRT, CTRS Location: Dayroom  Group Description: AAA. Animal-Assisted Activity provides opportunities for motivational, educational, therapeutic and/or recreational benefits to enhance quality of life. [name] and [name] visited the unit to interact with patients.   Goal Areas Addressed:  Reduced anxiety and stress Improved mood Increased social interaction Enhanced communication skills Reduced loneliness and isolation Improved emotional regulation   Affect/Mood: Blunted and Flat   Participation Level: Moderate    Clinical Observations/Individualized Feedback: Valerie [name] was present in the dayroom during group. Pt appears irritable and attention seeking by clapping her hands loudly at the dog.   Plan: Continue to engage patient in RT group sessions 2-3x/week.   Jeoffrey BRAVO Celestia, LRT, CTRS 07/18/2024 12:01 PM

## 2024-07-18 NOTE — Progress Notes (Signed)
 [facility] MD Progress Note   8:16 PM Valerie [name]  MRN:  [id number]   Valerie [name] is a 64 year old female presenting voluntary to [facility] requesting a psychiatric evaluation for worsening psychosis. Patient has history of schizophrenia. Patient denied SI, HI, psychosis and alcohol/drug usage. Patient resides at [facility]. Patient reports people are coming by my door talking loud, yelling and screaming and coming in my room with keys. Patient reports having multiple medical Afibs and blood clots due to this. Patient reports people are doing things in the dining room. When asked if she called EMS, patient stated I called EMS, I stripped searched over the phone and called the authorities. Patient is admitted to [facility] unit with Q15 min safety monitoring. Multidisciplinary team approach is offered. Medication management; group/milieu therapy is offered.   Sister Shirel Mallis : [phone]- called on 07/07/24 went to generic voicemail-sister called back and provider discussed at length the current treatment plan of patient being on Clozaril -intermittently refusing, enforced with Zyprexa  IM and recent and addition of Geodon .  Due to patient's ongoing poor response to medications and ongoing delusions discussed the plan of switching Clozaril  to another antipsychotic that comes in LAI form.  Clozaril  may not be the best option for patients who are noncompliant and refusing labs.  Sister is agreeable with the plan  Subjective:  Chart reviewed, case discussed in multidisciplinary meeting, patient seen during rounds.   Patient is noted to be sitting in the day area.  She is calm and cooperative, getting along with other peers and staff members.  She is not displaying intrusive behaviors.  Patient displays chronic delusions about her living situation but currently is able to acknowledge all her family members including her mom and sisters and brother.  Patient did acknowledge that  the team is working with her family members regarding her transition back into the community.  She is taking her medications with no reported side effects.  She denies SI/HI/plan and denies hallucinations.  Social work team is working with family at the continued to express concerns about patient's ability to support herself in the community.  Family was encouraged to seek legal guardianship given patient's mental status of chronic delusions and unable to care for self  Past Psychiatric History: see h&P Family History:  Family History  Problem Relation Age of Onset   Colon cancer Neg Hx    Celiac disease Neg Hx    Inflammatory bowel disease Neg Hx    Social History:  Social History   Substance and Sexual Activity  Alcohol Use Never     Social History   Substance and Sexual Activity  Drug Use Never    Social History   Socioeconomic History   Marital status: Single    Spouse name: Not on file   Number of children: Not on file   Years of education: Not on file   Highest education level: Not on file  Occupational History   Not on file  Tobacco Use   Smoking status: Never   Smokeless tobacco: Never  Substance and Sexual Activity   Alcohol use: Never   Drug use: Never   Sexual activity: Not Currently    Birth control/protection: Pill  Other Topics Concern   Not on file  Social History Narrative   Not on file   Social Drivers of Health   Tobacco Use: Low Risk (06/19/2024)   Patient History    Smoking Tobacco Use: Never    Smokeless  Tobacco Use: Never    Passive Exposure: Not on file  Financial Resource Strain: Not on file  Food Insecurity: Unknown (06/19/2024)   Epic    Worried About Programme Researcher, Broadcasting/film/video in the Last Year: Never true    Ran Out of Food in the Last Year: Patient declined  Transportation Needs: No Transportation Needs (06/19/2024)   Epic    Lack of Transportation (Medical): No    Lack of Transportation (Non-Medical): No  Physical Activity: Not on file   Stress: Not on file  Social Connections: Not on file  Depression (PHQ2-9): Low Risk (12/21/2022)   Depression (PHQ2-9)    PHQ-2 Score: 0  Alcohol Screen: Low Risk (06/19/2024)   Alcohol Screen    Last Alcohol Screening Score (AUDIT): 0  Housing: Low Risk (06/19/2024)   Epic    Unable to Pay for Housing in the Last Year: No    Number of Times Moved in the Last Year: 0    Homeless in the Last Year: No  Utilities: Not At Risk (06/19/2024)   Epic    Threatened with loss of utilities: No  Health Literacy: Not on file   Past Medical History:  Past Medical History:  Diagnosis Date   Chronic lower extremity pain (2ry area of Pain) (Right) 12/21/2022   Chronic pain syndrome 12/20/2022   DDD (degenerative disc disease), 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 Tobie: Pancolonic diverticulosis, tortuous colon, next colonoscopy 2024   TONSILLECTOMY      Current Medications: Current Facility-Administered Medications  Medication Dose Route Frequency Provider Last Rate Last Admin   acetaminophen  (TYLENOL ) tablet 650 mg  650 mg Oral Q6H PRN Onuoha, Chinwendu V, NP   650 mg at 07/11/24 1835   aspirin  EC tablet 81 mg  81 mg Oral Daily Bobbitt, Shalon E, NP   81 mg at 07/18/24 0943   cholecalciferol  (VITAMIN D3) 25 MCG (1000 UNIT) tablet 1,000 Units  1,000 Units Oral Weekly Edy Belt, MD   1,000 Units at 07/18/24 1232   cloZAPine  (CLOZARIL ) tablet 100 mg  100 mg Oral BID Alphons Burgert, MD   100 mg at 07/18/24 9055   Or   OLANZapine  (ZYPREXA ) injection 10 mg  10 mg Intramuscular BID Willmer Fellers, MD   10 mg at 07/05/24 2233   dapagliflozin  propanediol (FARXIGA ) tablet 10 mg  10 mg Oral Daily Josette Ade, MD   10 mg at 07/18/24 9056   diclofenac  Sodium (VOLTAREN ) 1 % topical gel 4 g  4 g Topical QID PRN Madaram, Kondal R, MD        DULoxetine  (CYMBALTA ) DR capsule 40 mg  40 mg Oral Daily Eron Staat, MD   40 mg at 07/18/24 9056   famotidine  (PEPCID ) tablet 40 mg  40 mg Oral Daily Shrivastava, Aryendra, MD   40 mg at 07/18/24 0944   fluticasone  (FLONASE ) 50 MCG/ACT nasal spray 1 spray  1 spray Each Nare Daily PRN Madaram, Kondal R, MD       furosemide  (LASIX ) tablet 40 mg  40 mg Oral 2 times per day Josette Ade, MD   40 mg at 07/18/24 1636   gabapentin  (NEURONTIN ) capsule 300 mg  300 mg Oral TID Bobbitt, Shalon E, NP   300 mg at 07/18/24 1636   hydrOXYzine  (ATARAX ) tablet 25 mg  25 mg Oral TID PRN Bobbitt, Shalon E, NP   25 mg at 07/11/24 1805   magnesium  hydroxide (MILK OF MAGNESIA) suspension 30 mL  30 mL Oral Daily PRN Bobbitt, Shalon E, NP       metoprolol  succinate (TOPROL -XL) 24 hr tablet 25 mg  25 mg Oral BID Josette Ade, MD   25 mg at 07/18/24 0944   multivitamin with minerals tablet 1 tablet  1 tablet Oral Daily Bobbitt, Shalon E, NP   1 tablet at 07/18/24 0943   naproxen  (NAPROSYN ) tablet 375 mg  375 mg Oral TID WC Leah Thornberry, MD   375 mg at 07/18/24 1636   OLANZapine  (ZYPREXA ) injection 5 mg  5 mg Intramuscular TID PRN Bobbitt, Shalon E, NP   5 mg at 06/22/24 2259   OLANZapine  zydis (ZYPREXA ) disintegrating tablet 5 mg  5 mg Oral TID PRN Bobbitt, Shalon E, NP   5 mg at 07/11/24 1835   ondansetron  (ZOFRAN ) tablet 4 mg  4 mg Oral Q8H PRN Bobbitt, Shalon E, NP   4 mg at 07/11/24 1835   potassium chloride  SA (KLOR-CON  M) CR tablet 20 mEq  20 mEq Oral BID Bobbitt, Shalon E, NP   20 mEq at 07/18/24 0944   rosuvastatin  (CRESTOR ) tablet 5 mg  5 mg Oral Daily Bobbitt, Shalon E, NP   5 mg at 07/18/24 0944   traZODone  (DESYREL ) tablet 50 mg  50 mg Oral QHS PRN Bobbitt, Shalon E, NP       ziprasidone  (GEODON ) capsule 20 mg  20 mg Oral QHS Joeanthony Seeling, MD   20 mg at 07/17/24 2107   zolpidem  (AMBIEN ) tablet 10 mg  10 mg Oral QHS Ken Bonn, MD   10 mg at 07/17/24 2107    Lab Results:  Results for  orders placed or performed during the hospital encounter of 06/19/24 (from the past 48 hours)  Glucose, capillary     Status: Abnormal   Collection Time: 07/17/24  6:20 AM  Result Value Ref Range   Glucose-Capillary 112 (H) 70 - 99 mg/dL    Comment: Glucose reference range applies only to samples taken after fasting for at least 8 hours.  Basic metabolic panel     Status: Abnormal   Collection Time: 07/17/24  9:30 AM  Result Value Ref Range   Sodium 139 135 - 145 mmol/L   Potassium 4.1 3.5 - 5.1 mmol/L   Chloride 99 98 - 111 mmol/L   CO2 32 22 - 32 mmol/L   Glucose, Bld 116 (H) 70 - 99 mg/dL    Comment: Glucose reference range applies only to samples taken after fasting for at least 8 hours.   BUN 14 8 - 23 mg/dL   Creatinine, Ser 9.27 0.44 - 1.00 mg/dL   Calcium  9.6 8.9 - 10.3 mg/dL   GFR, Estimated >39 >39 mL/min    Comment: (NOTE) Calculated using the CKD-EPI Creatinine Equation (2021)    Anion gap 8 5 - 15    Comment: Performed at [facility], [address] [place], [place] [address]  Uric acid     Status: None   Collection Time: 07/17/24  9:30 AM  Result Value Ref Range   Uric Acid, Serum 3.6 2.5 - 7.1 mg/dL    Comment: Performed at [facility], [address] [place], [place] [address]  Glucose, capillary     Status: Abnormal   Collection Time: 07/18/24  7:48 AM  Result Value Ref Range  Glucose-Capillary 110 (H) 70 - 99 mg/dL    Comment: Glucose reference range applies only to samples taken after fasting for at least 8 hours.        Blood Alcohol level:  Lab Results  Component Value Date   [place] <15 06/18/2024    Metabolic Disorder Labs: Lab Results  Component Value Date   HGBA1C 5.9 (H) 06/28/2024   MPG 122.63 06/28/2024   No results found for: PROLACTIN Lab Results  Component Value Date   CHOL 156 06/28/2024   TRIG 89 06/28/2024   HDL 70 06/28/2024   CHOLHDL 2.2 06/28/2024   VLDL 18 06/28/2024   LDLCALC 69 06/28/2024     Physical Findings: AIMS:  , ,  ,  ,    CIWA:    COWS:      Psychiatric Specialty Exam:  Presentation  General Appearance:  Casual  Eye Contact: Fleeting  Speech: Normal Rate  Speech Volume: Increased    Mood and Affect  Mood: Euphoric  Affect: stable  Thought Process  Thought Processes: Improving illogical  Orientation:Full (Time, Place and Person)  Thought Content:Illogical; Delusions; Tangential  Hallucinations: Denies  Ideas of Reference:Delusions  Suicidal Thoughts: Denies  Homicidal Thoughts: Denies   Sensorium  Memory: Immediate Fair; Remote Fair  Judgment: Impaired  Insight: Shallow   Executive Functions  Concentration: Poor  Attention Span: Poor  Recall: [employer] of Knowledge: Fair  Language: Fair   Psychomotor Activity  Psychomotor Activity: No data recorded  Musculoskeletal: Strength & Muscle Tone: within normal limits Gait & Station: normal Assets  Assets: Manufacturing Systems Engineer; Desire for Improvement    Physical Exam: Physical Exam Vitals and nursing note reviewed.    ROS Blood pressure 102/60, pulse (!) 104, temperature 97.6 F (36.4 C), resp. rate 14, height 5' 5 (1.651 m), weight 81 kg, SpO2 100%. Body mass index is 29.7 kg/m.  Diagnosis: Principal Problem:   Bilateral lower extremity edema Active Problems:   Hypercholesteremia   Schizoaffective disorder, bipolar type (HCC)   Chest pain   Sinus tachycardia   Hypotension   Controlled type 2 diabetes mellitus without complication, without long-term current use of insulin (HCC)   Hyponatremia  Schizoaffective disorder bipolar type  Clinical Decision Making: Patient is currently admitted for worsening psychosis, manic symptoms, tangential grandiosity.  She needs inpatient hospitalization for medication management and close monitoring.  Patient is unable to make decisions on her own as she is unable to appreciate and understand her mental  health history, the current need for treatment.  [facility] has screened her case and for further evaluation for legal guardianship. [facility] screened in her case. Sister and mom are involved in her care.  Treatment Plan Summary:  Safety and Monitoring:             -- inoluntary admission to inpatient psychiatric unit for safety, stabilization and treatment             -- Daily contact with patient to assess and evaluate symptoms and progress in treatment             -- Patient's case to be discussed in multi-disciplinary team meeting             -- Observation Level: q15 minute checks             -- Vital signs:  q12 hours             -- Precautions: suicide, elopement, and assault   2. Psychiatric Diagnoses and  Treatment:  Discontinue Depakote  as patient is noncompliant ContGeodon 20 mg nightly as patient is agreeable to take Geodon  Clozaril  100 mg twice daily enforced with zyprexa  10mg  BID- Cymbalta  40 mg daily   -- The risks/benefits/side-effects/alternatives to this medication were discussed in detail with the patient and time was given for questions. The patient consents to medication trial.                -- Metabolic profile and EKG monitoring obtained while on an atypical antipsychotic (BMI: Lipid Panel: HbgA1c: QTc:)              -- Encouraged patient to participate in unit milieu and in scheduled group therapies                            3. Medical Issues Being Addressed:   Chest pain Sinus tachycardia HTN (hypertension) Bilateral lower extremity edema Hypercholesteremia  12/30:  Troponin negative x 2. Echocardiogram done with pending results.  Small improvement in lower extremity edema so continuing p.o. Lasix .  Will need BMP every other day.  4. Discharge Planning:   -- Social work and case management to assist with discharge planning and identification of hospital follow-up needs prior to discharge  -- Estimated LOS: 3-4 days  Remberto Lienhard, MD 07/18/2024, 8:16 PM

## 2024-07-18 NOTE — Progress Notes (Signed)
" °   07/17/24 2100  Psych Admission Type (Psych Patients Only)  Admission Status Involuntary  Psychosocial Assessment  Patient Complaints Irritability  Eye Contact Brief;Darting  Facial Expression Animated  Affect Labile  Speech Pressured;Loud;Tangential  Interaction Assertive  Motor Activity Slow  Appearance/Hygiene Unremarkable  Behavior Characteristics Agressive verbally;Irritable  Mood Labile;Angry;Preoccupied  Thought Process  Coherency Disorganized  Content Preoccupation  Delusions Grandeur;Religious  Perception WDL  Hallucination None reported or observed  Judgment Impaired  Confusion Mild  Danger to Self  Current suicidal ideation? Denies  Danger to Others  Danger to Others None reported or observed   Mood/Behavior:  Pt labile. Pt initially pleasant and cooperative. Pt smiling. Pt repeatedly saying I love everyone. Discussed goals with pt and pt wanted to keep be kind as one of her goals. Pt told clinical research associate I love you. Noted being polite saying thank you and I trust you to staff. Pt stated the old Baila is back. Pt also making religious references.   Psych assessment: Denies SI/HI and AVH.     Interaction / Group attendance:  Present in the milieu. Interacting with peers and staff.  Attended group.Cheerful during group singing and participating.   Medication/ PRNs:  When reviewing medications with pt stated no when Clozaril  called out as a medication to take. Discussed POC with pt and pt then stated okay I will take it. Compliant with scheduled medications. No prns required. Pt later up to speak to clinical research associate. Pt argumentative stating I am not suppose to take two antipsychotics. Pt then blaming writer stating you change my medications and I am going to build a lawsuit against you. Discussed POC with pt and pt refusing education. Pt loud and yelling at writer and noted to walk away while field seismologist stupid bitch. Pt redirected to her room.   Pain: Denies   15  min checks in place for safety. "

## 2024-07-18 NOTE — Progress Notes (Signed)
" °   07/18/24 1400  Psych Admission Type (Psych Patients Only)  Admission Status Involuntary  Psychosocial Assessment  Patient Complaints Crying spells  Eye Contact Brief  Facial Expression Animated  Affect Labile  Speech Tangential  Interaction Assertive  Motor Activity Slow  Appearance/Hygiene Unremarkable  Behavior Characteristics Fidgety  Mood Labile  Thought Process  Coherency Disorganized  Content Preoccupation  Delusions Grandeur;Religious  Perception WDL  Hallucination None reported or observed  Judgment Impaired  Confusion Mild  Danger to Self  Current suicidal ideation? Denies  Danger to Others  Danger to Others None reported or observed    "

## 2024-07-18 NOTE — Plan of Care (Signed)
°  Problem: Self-Concept: Goal: Ability to modify response to factors that promote anxiety will improve Outcome: Progressing   Problem: Activity: Goal: Will verbalize the importance of balancing activity with adequate rest periods Outcome: Progressing

## 2024-07-18 NOTE — Plan of Care (Signed)
" °  Problem: Education: Goal: Knowledge of [facility] Education information/materials will improve 07/18/2024 0641 by Geneva Steward CROME, RN Outcome: Progressing 07/18/2024 0449 by Geneva Steward CROME, RN Outcome: Progressing Goal: Emotional status will improve 07/18/2024 0641 by Geneva Steward CROME, RN Outcome: Progressing 07/18/2024 0449 by Geneva Steward CROME, RN Outcome: Progressing Goal: Mental status will improve 07/18/2024 0641 by Geneva Steward CROME, RN Outcome: Progressing 07/18/2024 0449 by Geneva Steward CROME, RN Outcome: Progressing Goal: Verbalization of understanding the information provided will improve 07/18/2024 0641 by Geneva Steward CROME, RN Outcome: Progressing 07/18/2024 0449 by Geneva Steward CROME, RN Outcome: Progressing   Problem: Activity: Goal: Interest or engagement in activities will improve 07/18/2024 0641 by Geneva Steward CROME, RN Outcome: Progressing 07/18/2024 0449 by Geneva Steward CROME, RN Outcome: Progressing Goal: Sleeping patterns will improve 07/18/2024 0641 by Geneva Steward CROME, RN Outcome: Progressing 07/18/2024 0449 by Geneva Steward CROME, RN Outcome: Progressing   Problem: Coping: Goal: Ability to verbalize frustrations and anger appropriately will improve 07/18/2024 0641 by Geneva Steward CROME, RN Outcome: Progressing 07/18/2024 0449 by Geneva Steward CROME, RN Outcome: Progressing Goal: Ability to demonstrate self-control will improve 07/18/2024 0641 by Geneva Steward CROME, RN Outcome: Progressing 07/18/2024 0449 by Geneva Steward CROME, RN Outcome: Progressing   Problem: Health Behavior/Discharge Planning: Goal: Identification of resources available to assist in meeting health care needs will improve 07/18/2024 0641 by Geneva Steward CROME, RN Outcome: Progressing 07/18/2024 0449 by Geneva Steward CROME, RN Outcome: Progressing Goal: Compliance with treatment plan for underlying cause of condition will improve 07/18/2024 0641 by Geneva Steward CROME, RN Outcome: Progressing 07/18/2024 0449 by Geneva Steward CROME, RN Outcome:  Progressing   Problem: Physical Regulation: Goal: Ability to maintain clinical measurements within normal limits will improve 07/18/2024 0641 by Geneva Steward CROME, RN Outcome: Progressing 07/18/2024 0449 by Geneva Steward CROME, RN Outcome: Progressing   Problem: Safety: Goal: Periods of time without injury will increase 07/18/2024 0641 by Geneva Steward CROME, RN Outcome: Progressing 07/18/2024 0449 by Geneva Steward CROME, RN Outcome: Progressing   Problem: Education: Goal: Ability to state activities that reduce stress will improve 07/18/2024 0641 by Geneva Steward CROME, RN Outcome: Progressing 07/18/2024 0449 by Geneva Steward CROME, RN Outcome: Progressing   Problem: Self-Concept: Goal: Ability to identify factors that promote anxiety will improve 07/18/2024 0641 by Geneva Steward CROME, RN Outcome: Progressing 07/18/2024 0449 by Geneva Steward CROME, RN Outcome: Progressing Goal: Level of anxiety will decrease 07/18/2024 0641 by Geneva Steward CROME, RN Outcome: Progressing 07/18/2024 0449 by Geneva Steward CROME, RN Outcome: Progressing Goal: Ability to modify response to factors that promote anxiety will improve 07/18/2024 0641 by Geneva Steward CROME, RN Outcome: Progressing 07/18/2024 0449 by Geneva Steward CROME, RN Outcome: Progressing   Problem: Activity: Goal: Will verbalize the importance of balancing activity with adequate rest periods 07/18/2024 0641 by Geneva Steward CROME, RN Outcome: Progressing 07/18/2024 0449 by Geneva Steward CROME, RN Outcome: Progressing   "

## 2024-07-18 NOTE — Progress Notes (Signed)
" °   07/18/24 2100  Psych Admission Type (Psych Patients Only)  Admission Status Involuntary  Psychosocial Assessment  Patient Complaints Anger;Irritability  Eye Contact Brief;Darting  Facial Expression Angry;Animated  Affect Labile;Preoccupied  Speech Argumentative;Pressured;Loud  Interaction Demanding  Motor Activity Slow  Appearance/Hygiene Unremarkable  Behavior Characteristics Impulsive;Irritable;Resistant to care  Mood Labile;Angry;Preoccupied  Thought Process  Coherency Disorganized  Content Preoccupation  Delusions Grandeur;Religious  Perception WDL  Hallucination None reported or observed  Judgment Impaired  Confusion Mild  Danger to Self  Current suicidal ideation? Denies  Danger to Others  Danger to Others None reported or observed   Mood/Behavior:  Pt labile. Pt noted smiling interacting then becoming irritable with staff. Pt noted to call MHT a bitch. Pt refusing to receive medications from writer care nurse and demanding medicatine be given by the other nurse. Pt noted to stand up then walk to her room. Pt compliant with medicine with other nurse and pt noted to come up to the nurses station and say hey everyone how are yall doing? During safety check MHT visualized the pt assignment board removed from pts wall. On attempt to locate pt assignment board pt yelling at staff repeating get out I am okay please get out. Pt informed that we have to have the pt assignment board and pt responded it is in the trash. Assignment board removed from pt room.    Psych assessment: Denies SI/HI and AVH.     Interaction / Group attendance:  Present in the milieu. Pt cursing and making inappropriate comments intermittently. Interacting with peers and staff.  Attended group.   Medication/ PRNs: Compliant with scheduled medications.   Pain: Denies   15 min checks in place for safety. "

## 2024-07-18 NOTE — Plan of Care (Signed)
" °  Problem: Education: Goal: Knowledge of [facility] Education information/materials will improve Outcome: Progressing Goal: Emotional status will improve Outcome: Progressing Goal: Mental status will improve Outcome: Progressing Goal: Verbalization of understanding the information provided will improve Outcome: Progressing   Problem: Activity: Goal: Interest or engagement in activities will improve Outcome: Progressing Goal: Sleeping patterns will improve Outcome: Progressing   Problem: Coping: Goal: Ability to verbalize frustrations and anger appropriately will improve Outcome: Progressing Goal: Ability to demonstrate self-control will improve Outcome: Progressing   Problem: Health Behavior/Discharge Planning: Goal: Identification of resources available to assist in meeting health care needs will improve Outcome: Progressing Goal: Compliance with treatment plan for underlying cause of condition will improve Outcome: Progressing   Problem: Physical Regulation: Goal: Ability to maintain clinical measurements within normal limits will improve Outcome: Progressing   Problem: Safety: Goal: Periods of time without injury will increase Outcome: Progressing   Problem: Education: Goal: Ability to state activities that reduce stress will improve Outcome: Progressing   Problem: Self-Concept: Goal: Ability to identify factors that promote anxiety will improve Outcome: Progressing Goal: Level of anxiety will decrease Outcome: Progressing Goal: Ability to modify response to factors that promote anxiety will improve Outcome: Progressing   Problem: Activity: Goal: Will verbalize the importance of balancing activity with adequate rest periods Outcome: Progressing   "

## 2024-07-18 NOTE — Plan of Care (Signed)
" °  Problem: Education: Goal: Knowledge of [facility] Education information/materials will improve 07/18/2024 2037 by Geneva Steward CROME, RN Outcome: Progressing 07/18/2024 0641 by Geneva Steward CROME, RN Outcome: Progressing Goal: Emotional status will improve 07/18/2024 2037 by Geneva Steward CROME, RN Outcome: Progressing 07/18/2024 0641 by Geneva Steward CROME, RN Outcome: Progressing Goal: Mental status will improve 07/18/2024 2037 by Geneva Steward CROME, RN Outcome: Progressing 07/18/2024 0641 by Geneva Steward CROME, RN Outcome: Progressing Goal: Verbalization of understanding the information provided will improve 07/18/2024 2037 by Geneva Steward CROME, RN Outcome: Progressing 07/18/2024 0641 by Geneva Steward CROME, RN Outcome: Progressing   Problem: Activity: Goal: Interest or engagement in activities will improve 07/18/2024 2037 by Geneva Steward CROME, RN Outcome: Progressing 07/18/2024 0641 by Geneva Steward CROME, RN Outcome: Progressing Goal: Sleeping patterns will improve 07/18/2024 2037 by Geneva Steward CROME, RN Outcome: Progressing 07/18/2024 0641 by Geneva Steward CROME, RN Outcome: Progressing   Problem: Coping: Goal: Ability to verbalize frustrations and anger appropriately will improve 07/18/2024 2037 by Geneva Steward CROME, RN Outcome: Progressing 07/18/2024 0641 by Geneva Steward CROME, RN Outcome: Progressing Goal: Ability to demonstrate self-control will improve 07/18/2024 2037 by Geneva Steward CROME, RN Outcome: Progressing 07/18/2024 0641 by Geneva Steward CROME, RN Outcome: Progressing   Problem: Health Behavior/Discharge Planning: Goal: Identification of resources available to assist in meeting health care needs will improve 07/18/2024 2037 by Geneva Steward CROME, RN Outcome: Progressing 07/18/2024 0641 by Geneva Steward CROME, RN Outcome: Progressing Goal: Compliance with treatment plan for underlying cause of condition will improve 07/18/2024 2037 by Geneva Steward CROME, RN Outcome: Progressing 07/18/2024 0641 by Geneva Steward CROME, RN Outcome:  Progressing   Problem: Physical Regulation: Goal: Ability to maintain clinical measurements within normal limits will improve 07/18/2024 2037 by Geneva Steward CROME, RN Outcome: Progressing 07/18/2024 0641 by Geneva Steward CROME, RN Outcome: Progressing   Problem: Safety: Goal: Periods of time without injury will increase 07/18/2024 2037 by Geneva Steward CROME, RN Outcome: Progressing 07/18/2024 0641 by Geneva Steward CROME, RN Outcome: Progressing   Problem: Education: Goal: Ability to state activities that reduce stress will improve 07/18/2024 2037 by Geneva Steward CROME, RN Outcome: Progressing 07/18/2024 0641 by Geneva Steward CROME, RN Outcome: Progressing   Problem: Self-Concept: Goal: Ability to identify factors that promote anxiety will improve 07/18/2024 2037 by Geneva Steward CROME, RN Outcome: Progressing 07/18/2024 0641 by Geneva Steward CROME, RN Outcome: Progressing Goal: Level of anxiety will decrease 07/18/2024 2037 by Geneva Steward CROME, RN Outcome: Progressing 07/18/2024 0641 by Geneva Steward CROME, RN Outcome: Progressing Goal: Ability to modify response to factors that promote anxiety will improve 07/18/2024 2037 by Geneva Steward CROME, RN Outcome: Progressing 07/18/2024 0641 by Geneva Steward CROME, RN Outcome: Progressing   Problem: Activity: Goal: Will verbalize the importance of balancing activity with adequate rest periods 07/18/2024 2037 by Geneva Steward CROME, RN Outcome: Progressing 07/18/2024 0641 by Geneva Steward CROME, RN Outcome: Progressing   "

## 2024-07-18 NOTE — Group Note (Signed)
 LCSW Group Therapy Note  Group Date: 07/18/2024 Start Time: 1330 End Time: 1400   Type of Therapy and Topic:  Group Therapy - Healthy vs Unhealthy Coping Skills  Participation Level:  Active   Description of Group The focus of this group was to determine what unhealthy coping techniques typically are used by group members and what healthy coping techniques would be helpful in coping with various problems. Patients were guided in becoming aware of the differences between healthy and unhealthy coping techniques. Patients were asked to identify 2-3 healthy coping skills they would like to learn to use more effectively.  Therapeutic Goals Patients learned that coping is what human beings do all day long to deal with various situations in their lives Patients defined and discussed healthy vs unhealthy coping techniques Patients identified their preferred coping techniques and identified whether these were healthy or unhealthy Patients determined 2-3 healthy coping skills they would like to become more familiar with and use more often. Patients provided support and ideas to each other   Summary of Patient Progress:  During group, Doniesha expressed multiple coping skills that she has used, both negative and positive. Patient proved open to input from peers and feedback from CSW. Patient demonstrated fair insight into the subject matter, was respectful of peers, and participated throughout the entire session.   Therapeutic Modalities Cognitive Behavioral Therapy Motivational Interviewing  Lum JONETTA Croft, [place] 07/18/2024  2:28 PM

## 2024-07-18 NOTE — Group Note (Signed)
 Date:  07/18/2024 Time:  4:02 PM  Group Topic/Focus:  Making Healthy Choices:   The focus of this group is to help patients identify negative/unhealthy choices they were using prior to admission and identify positive/healthier coping strategies to replace them upon discharge.    Participation Level:  Did Not Attend   Valerie [name] 07/18/2024, 4:02 PM

## 2024-07-19 LAB — BASIC METABOLIC PANEL WITH GFR
Anion gap: 11 (ref 5–15)
BUN: 18 mg/dL (ref 8–23)
CO2: 26 mmol/L (ref 22–32)
Calcium: 9.3 mg/dL (ref 8.9–10.3)
Chloride: 101 mmol/L (ref 98–111)
Creatinine, Ser: 0.68 mg/dL (ref 0.44–1.00)
GFR, Estimated: >60 mL/min
Glucose, Bld: 128 mg/dL — ABNORMAL HIGH (ref 70–99)
Potassium: 4.5 mmol/L (ref 3.5–5.1)
Sodium: 138 mmol/L (ref 135–145)

## 2024-07-19 LAB — GLUCOSE, CAPILLARY: Glucose-Capillary: 123 mg/dL — ABNORMAL HIGH (ref 70–99)

## 2024-07-19 NOTE — Progress Notes (Signed)
 " PROGRESS NOTE    Valerie [name]  FMW:[id number] DOB: June 29, 1961 DOA: 06/19/2024 PCP: System, Provider Not In  Subjective: No acute events overnight. Seen and examined in inpatient psych unit. No new complaints. Reports feeling well and wondering when she'll be discharged. Reports ongoing bilateral LE edema that has not changed and does not want to wear compression stockings still. Denies nausea, vomiting, constipation.   Hospital Course: Valerie [name] is a 64 y.o. female with past medical history of schizophrenia and bipolar disorder who was admitted at geropsych facility due to manic episode.  She was complaining of some intermittent chest pain, mostly involving upper retrosternal area, intermittent, no relationship with activity and no radiation.  No associated factors.  Sometimes pain increased with moving around her arms, worsening bilateral lower extremity edema, denies any shortness of breath orthopnea or PND.   Psych was also concerned about her fluctuating blood pressure going from borderline soft to little high. Psych was making some changes to her home medications.   12/30: Vitals and labs stable.  Troponin negative x 2. Echocardiogram done with pending results.  Small improvement in lower extremity edema so continuing p.o. Lasix .  Will need BMP every other day. 12/31.  Held Lasix  and lisinopril  secondary to low blood pressure.  Switched propranolol  over to Toprol -XL twice daily. 07/13/2024.  Blood pressure improved.  Will increase Lasix  to 40 mg orally twice daily for now.  Continue Toprol  XL. 1/2.  Continue Lasix  40 mg twice a day. 1/3.  Change diabetic medications over to Farxiga .  proBNP negative. 1/4.  Patient upset about some of the residents. 1/5.  Continue Lasix  twice a day. 1/6.  Patient wants to continue Lasix  twice a day.  I offered to decrease it down to once a day dosing.   Assessment and Plan: Bilateral lower extremity edema Sonogram left lower extremity negative  earlier in the hospital course.  proBNP negative. Continue Lasix  40 mg p.o. twice daily at patient request.  Continue Toprol -XL.  Patient does not want to wear compression stockings on the psychiatric floor.   Continue Farxiga .   Sinus tachycardia Heart rate improved with changing propranolol  over to Toprol -XL twice daily.  Hypotension Resolved.  Schizophrenia (HCC) - Being managed by primary team.  Chest pain Cardiac enzymes negative.  Echocardiogram showed EF of 65%.  Likely GERD.  Patient on famotidine   Hypercholesteremia Continue home Crestor   Hyponatremia Sodium a few points less than the normal range.  Controlled type 2 diabetes mellitus without complication, without long-term current use of insulin (HCC) Change diabetes medication over to Farxiga  and get rid of metformin  and Tradjenta .  Last hemoglobin A1c 5.9  Schizoaffective disorder, bipolar type (HCC) Patient on Geodon , trazodone , clozapine , gabapentin .    Code Status: Full Code  Disposition Plan: TBD by primary team   Objective: Vitals:   07/18/24 0737 07/18/24 0943 07/18/24 1928 07/19/24 0720  BP: 138/74  102/60 (!) 110/57  Pulse: (!) 53 67 (!) 104 91  Resp: 17  14 18   Temp: 97.9 F (36.6 C)  97.6 F (36.4 C) (!) 97.2 F (36.2 C)  TempSrc:      SpO2: 100%  100% 100%  Weight:      Height:       No intake or output data in the 24 hours ending 07/19/24 1540 Filed Weights   06/19/24 0725  Weight: 81 kg    Examination:  Physical Exam Vitals and nursing note reviewed.  Constitutional:      General: She is not  in acute distress.    Appearance: She is not ill-appearing.  HENT:     Head: Normocephalic and atraumatic.  Cardiovascular:     Rate and Rhythm: Normal rate and regular rhythm.     Pulses: Normal pulses.     Heart sounds: Normal heart sounds.  Pulmonary:     Effort: Pulmonary effort is normal.     Breath sounds: Normal breath sounds.  Abdominal:     General: Bowel sounds are normal.      Palpations: Abdomen is soft.  Musculoskeletal:     Right lower leg: Edema present.     Left lower leg: Edema present.  Neurological:     Mental Status: She is alert.     Data Reviewed: I have personally reviewed following labs and imaging studies  CBC: Recent Labs  Lab 07/13/24 0655  WBC 6.9  NEUTROABS 3.4  HGB 11.2*  HCT 34.7*  MCV 86.1  PLT 329   Basic Metabolic Panel: Recent Labs  Lab 07/13/24 0655 07/15/24 0657 07/17/24 0930 07/19/24 0611  NA 130* 139 139 138  K 3.8 4.0 4.1 4.5  CL 95* 100 99 101  CO2 27 28 32 26  GLUCOSE 109* 129* 116* 128*  BUN 11 15 14 18   CREATININE 0.47 0.66 0.72 0.68  CALCIUM  9.0 9.2 9.6 9.3  MG  --  2.2  --   --    GFR: Estimated Creatinine Clearance: 77.6 mL/min (by C-G formula based on SCr of 0.68 mg/dL). Liver Function Tests: No results for input(s): AST, ALT, ALKPHOS, BILITOT, PROT, ALBUMIN in the last 168 hours. No results for input(s): LIPASE, AMYLASE in the last 168 hours. No results for input(s): AMMONIA in the last 168 hours. Coagulation Profile: No results for input(s): INR, PROTIME in the last 168 hours. Cardiac Enzymes: No results for input(s): CKTOTAL, CKMB, CKMBINDEX, TROPONINI in the last 168 hours. ProBNP, BNP (last 5 results) Recent Labs    07/15/24 0657  PROBNP 178.0   HbA1C: No results for input(s): HGBA1C in the last 72 hours. CBG: Recent Labs  Lab 07/17/24 0620 07/18/24 0748 07/19/24 0556  GLUCAP 112* 110* 123*   Lipid Profile: No results for input(s): CHOL, HDL, LDLCALC, TRIG, CHOLHDL, LDLDIRECT in the last 72 hours. Thyroid Function Tests: No results for input(s): TSH, T4TOTAL, FREET4, T3FREE, THYROIDAB in the last 72 hours. Anemia Panel: No results for input(s): VITAMINB12, FOLATE, FERRITIN, TIBC, IRON, RETICCTPCT in the last 72 hours. Sepsis Labs: No results for input(s): PROCALCITON, LATICACIDVEN in the last 168  hours.  No results found for this or any previous visit (from the past 240 hours).   Radiology Studies: No results found.  Scheduled Meds:  aspirin  EC  81 mg Oral Daily   cholecalciferol   1,000 Units Oral Weekly   cloZAPine   100 mg Oral BID   Or   OLANZapine   10 mg Intramuscular BID   dapagliflozin  propanediol  10 mg Oral Daily   DULoxetine   40 mg Oral Daily   famotidine   40 mg Oral Daily   furosemide   40 mg Oral 2 times per day   gabapentin   300 mg Oral TID   metoprolol  succinate  25 mg Oral BID   multivitamin with minerals  1 tablet Oral Daily   naproxen   375 mg Oral TID WC   potassium chloride  SA  20 mEq Oral BID   rosuvastatin   5 mg Oral Daily   ziprasidone   20 mg Oral QHS   zolpidem   10 mg Oral  QHS   Continuous Infusions:   LOS: 30 days   Norval Bar, MD  [facility]  07/19/2024, 3:40 PM   "

## 2024-07-19 NOTE — Progress Notes (Addendum)
 Patient is no longer on Metformin  and Tradjenta . She is on Farxiga . Maintains Lasix  twice daily although provider offered to decrease to daily.    07/19/24 1000  Psych Admission Type (Psych Patients Only)  Admission Status Involuntary  Psychosocial Assessment  Patient Complaints Suspiciousness  Eye Contact Brief  Facial Expression Animated  Affect Labile  Speech Tangential  Interaction Attention-seeking;Intrusive  Motor Activity Slow  Appearance/Hygiene Unremarkable  Behavior Characteristics Fidgety;Anxious  Mood Labile;Suspicious  Thought Process  Coherency Disorganized  Content Preoccupation  Delusions Grandeur;Religious  Perception WDL  Hallucination None reported or observed  Judgment Impaired  Confusion Mild  Danger to Self  Current suicidal ideation? Denies  Danger to Others  Danger to Others None reported or observed

## 2024-07-19 NOTE — Progress Notes (Signed)
 [facility] MD Progress Note   12:38 PM Valerie [name]  MRN:  [id number]   Valerie [name] is a 64 year old female presenting voluntary to [facility] requesting a psychiatric evaluation for worsening psychosis. Patient has history of schizophrenia. Patient denied SI, HI, psychosis and alcohol/drug usage. Patient resides at [facility] Patient reports people are coming by my door talking loud, yelling and screaming and coming in my room with keys. Patient reports having multiple medical Afibs and blood clots due to this. Patient reports people are doing things in the dining room. When asked if she called EMS, patient stated I called EMS, I stripped searched over the phone and called the authorities. Patient is admitted to [facility] with Q15 min safety monitoring. Multidisciplinary team approach is offered. Medication management; group/milieu therapy is offered.   Sister Skylah Delauter : [phone]- called on 07/07/24 went to generic voicemail-sister called back and provider discussed at length the current treatment plan of patient being on Clozaril -intermittently refusing, enforced with Zyprexa  IM and recent and addition of Geodon .  Due to patient's ongoing poor response to medications and ongoing delusions discussed the plan of switching Clozaril  to another antipsychotic that comes in LAI form.  Clozaril  may not be the best option for patients who are noncompliant and refusing labs.  Sister is agreeable with the plan  Subjective:  Chart reviewed, case discussed in multidisciplinary meeting, patient seen during rounds.   Patient is noted to be sitting in the dayroom area.  She was anxious about her discharge planning.  Patient was updated by the team that the independent living she is coming from is not accepting any new patients including her back to the residence.  Patient is informed that her family is working on a new placement for her.  Patient reports tolerating medications  fairly well with no reported side effects.  Patient and provider discussed about increasing the dose of Geodon  to 40 mg to optimize her response and she agreed to the plan.  Past Psychiatric History: see h&P Family History:  Family History  Problem Relation Age of Onset   Colon cancer Neg Hx    Celiac disease Neg Hx    Inflammatory bowel disease Neg Hx    Social History:  Social History   Substance and Sexual Activity  Alcohol Use Never     Social History   Substance and Sexual Activity  Drug Use Never    Social History   Socioeconomic History   Marital status: Single    Spouse name: Not on file   Number of children: Not on file   Years of education: Not on file   Highest education level: Not on file  Occupational History   Not on file  Tobacco Use   Smoking status: Never   Smokeless tobacco: Never  Substance and Sexual Activity   Alcohol use: Never   Drug use: Never   Sexual activity: Not Currently    Birth control/protection: Pill  Other Topics Concern   Not on file  Social History Narrative   Not on file   Social Drivers of Health   Tobacco Use: Low Risk (06/19/2024)   Patient History    Smoking Tobacco Use: Never    Smokeless Tobacco Use: Never    Passive Exposure: Not on file  Financial Resource Strain: Not on file  Food Insecurity: Unknown (06/19/2024)   Epic    Worried About Programme Researcher, Broadcasting/film/video in the Last Year: Never true  Ran Out of Food in the Last Year: Patient declined  Transportation Needs: No Transportation Needs (06/19/2024)   Epic    Lack of Transportation (Medical): No    Lack of Transportation (Non-Medical): No  Physical Activity: Not on file  Stress: Not on file  Social Connections: Not on file  Depression (PHQ2-9): Low Risk (12/21/2022)   Depression (PHQ2-9)    PHQ-2 Score: 0  Alcohol Screen: Low Risk (06/19/2024)   Alcohol Screen    Last Alcohol Screening Score (AUDIT): 0  Housing: Low Risk (06/19/2024)   Epic    Unable to Pay  for Housing in the Last Year: No    Number of Times Moved in the Last Year: 0    Homeless in the Last Year: No  Utilities: Not At Risk (06/19/2024)   Epic    Threatened with loss of utilities: No  Health Literacy: Not on file   Past Medical History:  Past Medical History:  Diagnosis Date   Chronic lower extremity pain (2ry area of Pain) (Right) 12/21/2022   Chronic pain syndrome 12/20/2022   DDD (degenerative disc disease), 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 Tobie: Pancolonic diverticulosis, tortuous colon, next colonoscopy 2024   TONSILLECTOMY      Current Medications: Current Facility-Administered Medications  Medication Dose Route Frequency Provider Last Rate Last Admin   acetaminophen  (TYLENOL ) tablet 650 mg  650 mg Oral Q6H PRN Onuoha, Chinwendu V, NP   650 mg at 07/11/24 1835   aspirin  EC tablet 81 mg  81 mg Oral Daily Bobbitt, Shalon E, NP   81 mg at 07/19/24 0853   cholecalciferol  (VITAMIN D3) 25 MCG (1000 UNIT) tablet 1,000 Units  1,000 Units Oral Weekly Dmoni Fortson, MD   1,000 Units at 07/18/24 1232   cloZAPine  (CLOZARIL ) tablet 100 mg  100 mg Oral BID Charm Stenner, MD   100 mg at 07/19/24 9146   Or   OLANZapine  (ZYPREXA ) injection 10 mg  10 mg Intramuscular BID Aubreyanna Dorrough, MD   10 mg at 07/05/24 2233   dapagliflozin  propanediol (FARXIGA ) tablet 10 mg  10 mg Oral Daily Josette Ade, MD   10 mg at 07/19/24 9146   diclofenac  Sodium (VOLTAREN ) 1 % topical gel 4 g  4 g Topical QID PRN Madaram, Kondal R, MD       DULoxetine  (CYMBALTA ) DR capsule 40 mg  40 mg Oral Daily Orlanda Lemmerman, MD   40 mg at 07/19/24 9147   famotidine  (PEPCID ) tablet 40 mg  40 mg Oral Daily Shrivastava, Aryendra, MD   40 mg at 07/19/24 9147   fluticasone  (FLONASE ) 50 MCG/ACT nasal spray 1 spray  1 spray Each Nare Daily PRN Madaram,  Kondal R, MD       furosemide  (LASIX ) tablet 40 mg  40 mg Oral 2 times per day Josette Ade, MD   40 mg at 07/19/24 0853   gabapentin  (NEURONTIN ) capsule 300 mg  300 mg Oral TID Bobbitt, Shalon E, NP   300 mg at 07/19/24 9147   hydrOXYzine  (ATARAX ) tablet 25 mg  25 mg Oral TID PRN Bobbitt, Shalon E, NP   25 mg at 07/11/24 1805   magnesium  hydroxide (MILK OF MAGNESIA) suspension 30 mL  30 mL Oral Daily PRN Bobbitt, Shalon E, NP       metoprolol  succinate (  TOPROL -XL) 24 hr tablet 25 mg  25 mg Oral BID Josette Ade, MD   25 mg at 07/19/24 9147   multivitamin with minerals tablet 1 tablet  1 tablet Oral Daily Bobbitt, Shalon E, NP   1 tablet at 07/19/24 0852   naproxen  (NAPROSYN ) tablet 375 mg  375 mg Oral TID WC Kimberlyn Quiocho, MD   375 mg at 07/19/24 9147   OLANZapine  (ZYPREXA ) injection 5 mg  5 mg Intramuscular TID PRN Bobbitt, Shalon E, NP   5 mg at 06/22/24 2259   OLANZapine  zydis (ZYPREXA ) disintegrating tablet 5 mg  5 mg Oral TID PRN Bobbitt, Shalon E, NP   5 mg at 07/11/24 1835   ondansetron  (ZOFRAN ) tablet 4 mg  4 mg Oral Q8H PRN Bobbitt, Shalon E, NP   4 mg at 07/11/24 1835   potassium chloride  SA (KLOR-CON  M) CR tablet 20 mEq  20 mEq Oral BID Bobbitt, Shalon E, NP   20 mEq at 07/19/24 9147   rosuvastatin  (CRESTOR ) tablet 5 mg  5 mg Oral Daily Bobbitt, Shalon E, NP   5 mg at 07/19/24 9146   traZODone  (DESYREL ) tablet 50 mg  50 mg Oral QHS PRN Bobbitt, Shalon E, NP       ziprasidone  (GEODON ) capsule 20 mg  20 mg Oral QHS Signe Tackitt, MD   20 mg at 07/18/24 2127   zolpidem  (AMBIEN ) tablet 10 mg  10 mg Oral QHS Desteni Piscopo, MD   10 mg at 07/18/24 2126    Lab Results:  Results for orders placed or performed during the hospital encounter of 06/19/24 (from the past 48 hours)  Glucose, capillary     Status: Abnormal   Collection Time: 07/18/24  7:48 AM  Result Value Ref Range   Glucose-Capillary 110 (H) 70 - 99 mg/dL    Comment: Glucose reference range applies only to samples  taken after fasting for at least 8 hours.  Glucose, capillary     Status: Abnormal   Collection Time: 07/19/24  5:56 AM  Result Value Ref Range   Glucose-Capillary 123 (H) 70 - 99 mg/dL    Comment: Glucose reference range applies only to samples taken after fasting for at least 8 hours.  Basic metabolic panel     Status: Abnormal   Collection Time: 07/19/24  6:11 AM  Result Value Ref Range   Sodium 138 135 - 145 mmol/L   Potassium 4.5 3.5 - 5.1 mmol/L    Comment: HEMOLYSIS AT THIS LEVEL MAY AFFECT RESULT   Chloride 101 98 - 111 mmol/L   CO2 26 22 - 32 mmol/L   Glucose, Bld 128 (H) 70 - 99 mg/dL    Comment: Glucose reference range applies only to samples taken after fasting for at least 8 hours.   BUN 18 8 - 23 mg/dL   Creatinine, Ser 9.31 0.44 - 1.00 mg/dL   Calcium  9.3 8.9 - 10.3 mg/dL   GFR, Estimated >39 >39 mL/min    Comment: (NOTE) Calculated using the CKD-EPI Creatinine Equation (2021)    Anion gap 11 5 - 15    Comment: Performed at [facility], [address] [place], [place] [address]        Blood Alcohol level:  Lab Results  Component Value Date   [facility] <15 06/18/2024    Metabolic Disorder Labs: Lab Results  Component Value Date   HGBA1C 5.9 (H) 06/28/2024   MPG 122.63 06/28/2024   No results found for: PROLACTIN Lab Results  Component Value Date   CHOL 156 06/28/2024   TRIG 89 06/28/2024   HDL 70 06/28/2024   CHOLHDL 2.2 06/28/2024   VLDL 18 06/28/2024   LDLCALC 69 06/28/2024    Physical Findings: AIMS:  , ,  ,  ,    CIWA:    COWS:      Psychiatric Specialty Exam:  Presentation  General Appearance:  Casual  Eye Contact: Fleeting  Speech: Normal Rate  Speech Volume: Increased    Mood and Affect  Mood: Euphoric  Affect: stable  Thought Process  Thought Processes: Improving illogical  Orientation:Full (Time, Place and Person)  Thought Content:Illogical; Delusions; Tangential  Hallucinations: Denies  Ideas  of Reference:Delusions  Suicidal Thoughts: Denies  Homicidal Thoughts: Denies   Sensorium  Memory: Immediate Fair; Remote Fair  Judgment: Impaired  Insight: Shallow   Executive Functions  Concentration: Poor  Attention Span: Poor  Recall: [employer] of Knowledge: Fair  Language: Fair   Psychomotor Activity  Psychomotor Activity: No data recorded  Musculoskeletal: Strength & Muscle Tone: within normal limits Gait & Station: normal Assets  Assets: Manufacturing Systems Engineer; Desire for Improvement    Physical Exam: Physical Exam Vitals and nursing note reviewed.    ROS Blood pressure (!) 110/57, pulse 91, temperature (!) 97.2 F (36.2 C), resp. rate 18, height 5' 5 (1.651 m), weight 81 kg, SpO2 100%. Body mass index is 29.7 kg/m.  Diagnosis: Principal Problem:   Bilateral lower extremity edema Active Problems:   Hypercholesteremia   Schizoaffective disorder, bipolar type (HCC)   Chest pain   Sinus tachycardia   Hypotension   Controlled type 2 diabetes mellitus without complication, without long-term current use of insulin (HCC)   Hyponatremia  Schizoaffective disorder bipolar type  Clinical Decision Making: Patient is currently admitted for worsening psychosis, manic symptoms, tangential grandiosity.  She needs inpatient hospitalization for medication management and close monitoring.  Patient is unable to make decisions on her own as she is unable to appreciate and understand her mental health history, the current need for treatment.  [facility] has screened her case and for further evaluation for legal guardianship. [facility] screened in her case. Sister and mom are involved in her care.  Treatment Plan Summary:  Safety and Monitoring:             -- inoluntary admission to inpatient psychiatric unit for safety, stabilization and treatment             -- Daily contact with patient to assess and evaluate symptoms and progress in treatment             --  Patient's case to be discussed in multi-disciplinary team meeting             -- Observation Level: q15 minute checks             -- Vital signs:  q12 hours             -- Precautions: suicide, elopement, and assault   2. Psychiatric Diagnoses and Treatment:  Discontinue Depakote  as patient is noncompliant ContGeodon 20 mg nightly as patient is agreeable to take Geodon  Clozaril  100 mg twice daily enforced with zyprexa  10mg  BID- Cymbalta  40 mg daily   -- The risks/benefits/side-effects/alternatives to this medication were discussed in detail with the patient and time was given for questions. The patient consents to medication trial.                -- Metabolic profile and EKG  monitoring obtained while on an atypical antipsychotic (BMI: Lipid Panel: HbgA1c: QTc:)              -- Encouraged patient to participate in unit milieu and in scheduled group therapies                            3. Medical Issues Being Addressed:   Chest pain Sinus tachycardia HTN (hypertension) Bilateral lower extremity edema Hypercholesteremia  12/30:  Troponin negative x 2. Echocardiogram done with pending results.  Small improvement in lower extremity edema so continuing p.o. Lasix .  Will need BMP every other day.  4. Discharge Planning:   -- Social work and case management to assist with discharge planning and identification of hospital follow-up needs prior to discharge  -- Estimated LOS: 3-4 days  Katherene Dinino, MD 07/19/2024, 12:38 PM

## 2024-07-19 NOTE — Plan of Care (Signed)
   Problem: Coping: Goal: Ability to verbalize frustrations and anger appropriately will improve Outcome: Progressing Goal: Ability to demonstrate self-control will improve Outcome: Progressing

## 2024-07-19 NOTE — Group Note (Signed)
 Date:  07/19/2024 Time:  9:57 AM  Group Topic/Focus:  Coping With Mental Health Crisis:   The purpose of this group is to help patients identify strategies for coping with mental health crisis.  Group discusses possible causes of crisis and ways to manage them effectively.  Two motivational and supportive meditation videos were provided to promote relaxation and rejuvenation of the mind, body, and spirit. Patients were guided to reflect on their experiences from 2025 and encouraged to make intentional, healthy decisions for entering 2026 on a positive and balanced path. Time was allotted for guided breathing exercises, allowing patients to focus on deep inhalation and exhalation while engaging in personal reflection.  Participation Level:  Active  Participation Quality:  Appropriate  Affect:  Appropriate  Cognitive:  Appropriate  Insight: Appropriate  Engagement in Group:  Engaged  Modes of Intervention:  Activity and Discussion  Additional Comments:    Valerie [name] 07/19/2024, 9:57 AM

## 2024-07-19 NOTE — Group Note (Signed)
 Date:  07/19/2024 Time:  5:01 PM  Group Topic/Focus:  Coping With Mental Health Crisis:   The purpose of this group is to help patients identify strategies for coping with mental health crisis.  Group discusses possible causes of crisis and ways to manage them effectively. Healthy Communication:   The focus of this group is to discuss communication, barriers to communication, as well as healthy ways to communicate with others. Making Healthy Choices:   The focus of this group is to help patients identify negative/unhealthy choices they were using prior to admission and identify positive/healthier coping strategies to replace them upon discharge.  A group was facilitated focusing on fun, critical-thinking activities. Patients participated in games such as Family Feud and What Would You Do? scenarios. The group encouraged patients to identify positive aspects of situations while engaging in problem-solving and decision-making skills. Patients were given opportunities to share ideas, collaborate, and practice healthy coping strategies in a supportive environment.  Participation Level:  Minimal  Participation Quality:  Appropriate  Affect:  Appropriate  Cognitive:  Appropriate  Insight: Lacking and Limited  Engagement in Group:  Lacking and Limited  Modes of Intervention:  Activity and Discussion  Additional Comments:    Gurveer Colucci L Kirra Verga 07/19/2024, 5:01 PM

## 2024-07-19 NOTE — [facility] Counselor (Signed)
 Pt was visited by [facility] DSS).   Sonja reports DSS plans to close out the case as pt's family has filed for the guardianship and is awaiting a court date.   Adams reports that DSS is unable to assist pt's family in placing the pt.  CSW let Adams know that the hospital also does not assist with placement outside of providing an FL2 to the family and some information about placement options.   CSW assisted Sonja off of the unit without incident.   Lum Croft, MSW, [place] 07/19/2024 2:11 PM

## 2024-07-20 LAB — CBC WITH DIFFERENTIAL/PLATELET
Abs Immature Granulocytes: 0.02 K/uL (ref 0.00–0.07)
Basophils Absolute: 0.1 K/uL (ref 0.0–0.1)
Basophils Relative: 1 %
Eosinophils Absolute: 0.3 K/uL (ref 0.0–0.5)
Eosinophils Relative: 4 %
HCT: 36.4 % (ref 36.0–46.0)
Hemoglobin: 11.2 g/dL — ABNORMAL LOW (ref 12.0–15.0)
Immature Granulocytes: 0 %
Lymphocytes Relative: 40 %
Lymphs Abs: 2.8 K/uL (ref 0.7–4.0)
MCH: 27.3 pg (ref 26.0–34.0)
MCHC: 30.8 g/dL (ref 30.0–36.0)
MCV: 88.6 fL (ref 80.0–100.0)
Monocytes Absolute: 0.8 K/uL (ref 0.1–1.0)
Monocytes Relative: 11 %
Neutro Abs: 3.2 K/uL (ref 1.7–7.7)
Neutrophils Relative %: 44 %
Platelets: 375 K/uL (ref 150–400)
RBC: 4.11 MIL/uL (ref 3.87–5.11)
RDW: 13.6 % (ref 11.5–15.5)
WBC: 7.2 K/uL (ref 4.0–10.5)
nRBC: 0 % (ref 0.0–0.2)

## 2024-07-20 LAB — GLUCOSE, CAPILLARY: Glucose-Capillary: 136 mg/dL — ABNORMAL HIGH (ref 70–99)

## 2024-07-20 NOTE — Group Note (Signed)
 LCSW Group Therapy Note  Group Date: 07/20/2024 Start Time: 1325 End Time: 1340   Type of Therapy and Topic:  Group Therapy - How To Cope with Nervousness about Discharge   Participation Level:  Minimal   Description of Group This process group involved identification of patients' feelings about discharge. Some of them are scheduled to be discharged soon, while others are new admissions, but each of them was asked to share thoughts and feelings surrounding discharge from the hospital. One common theme was that they are excited at the prospect of going home, while another was that many of them are apprehensive about sharing why they were hospitalized. Patients were given the opportunity to discuss these feelings with their peers in preparation for discharge.  Therapeutic Goals  Patient will identify their overall feelings about pending discharge. Patient will think about how they might proactively address issues that they believe will once again arise once they get home (i.e. with parents). Patients will participate in discussion about having hope for change.   Summary of Patient Progress:  Valerie [name] was very active throughout the session. Valerie [name] demonstrated poor insight into the subject matter, and did NOT prove to be open to input from peers and feedback from CSW. Valerie [name] was NOT respectful to peers and had to be escorted out of group due to singing throughout the group and being asked to leave multiple times.    Therapeutic Modalities Cognitive Behavioral Therapy   Valerie [name], LCSWA 07/20/2024  2:22 PM

## 2024-07-20 NOTE — Progress Notes (Signed)
" °   07/19/24 2100  Psych Admission Type (Psych Patients Only)  Admission Status Involuntary  Psychosocial Assessment  Patient Complaints Suspiciousness  Eye Contact Brief  Facial Expression Animated  Affect Labile  Speech Tangential  Interaction Assertive  Motor Activity Slow  Appearance/Hygiene Unremarkable  Behavior Characteristics Cooperative  Mood Labile;Suspicious  Thought Process  Coherency Disorganized  Content Preoccupation  Delusions Grandeur  Perception WDL  Hallucination None reported or observed  Judgment Impaired  Confusion Mild  Danger to Self  Current suicidal ideation? Denies   Mood/Behavior:  Pleasant and cooperative. Pt singing and appropriate being polite.    Psych assessment: Denies SI/HI and AVH.     Interaction / Group attendance:  Present in the milieu. Interacting with peers and staff.  Attended group.   Medication/ PRNs: Compliant with scheduled medications.    Pain: Denies   15 min checks in place for safety. "

## 2024-07-20 NOTE — Group Note (Signed)
 Date:  07/20/2024 Time:  8:53 PM  Group Topic/Focus:  Wrap-Up Group:   The focus of this group is to help patients review their daily goal of treatment and discuss progress on daily workbooks.    Participation Level:  Active  Participation Quality:  Appropriate  Affect:  Appropriate  Cognitive:  Appropriate  Insight: Appropriate  Engagement in Group:  Engaged  Modes of Intervention:  Discussion  Additional Comments:    Valerie [name] 07/20/2024, 8:53 PM

## 2024-07-20 NOTE — Group Note (Signed)
 Date:  07/20/2024 Time:  10:54 AM  Group Topic/Focus:  Rediscovering Joy:   The focus of this group is to explore various ways to relieve stress in a positive manner.    Participation Level:  Active  Participation Quality:  Appropriate  Affect:  Appropriate  Cognitive:  Appropriate  Insight: Appropriate  Engagement in Group:  Engaged  Modes of Intervention:  Activity  Additional Comments:    Camellia HERO Naydene Kamrowski 07/20/2024, 10:54 AM

## 2024-07-20 NOTE — Plan of Care (Signed)

## 2024-07-20 NOTE — Plan of Care (Signed)
" °  Problem: Education: Goal: Knowledge of [place] [facility] Education information/materials will improve 07/20/2024 0521 by Geneva Steward CROME, RN Outcome: Progressing 07/20/2024 0521 by Geneva Steward CROME, RN Outcome: Progressing Goal: Emotional status will improve 07/20/2024 0521 by Geneva Steward CROME, RN Outcome: Progressing 07/20/2024 0521 by Geneva Steward CROME, RN Outcome: Progressing Goal: Mental status will improve 07/20/2024 0521 by Geneva Steward CROME, RN Outcome: Progressing 07/20/2024 0521 by Geneva Steward CROME, RN Outcome: Progressing Goal: Verbalization of understanding the information provided will improve 07/20/2024 0521 by Geneva Steward CROME, RN Outcome: Progressing 07/20/2024 0521 by Geneva Steward CROME, RN Outcome: Progressing   Problem: Activity: Goal: Interest or engagement in activities will improve 07/20/2024 0521 by Geneva Steward CROME, RN Outcome: Progressing 07/20/2024 0521 by Geneva Steward CROME, RN Outcome: Progressing Goal: Sleeping patterns will improve 07/20/2024 0521 by Geneva Steward CROME, RN Outcome: Progressing 07/20/2024 0521 by Geneva Steward CROME, RN Outcome: Progressing   Problem: Coping: Goal: Ability to verbalize frustrations and anger appropriately will improve 07/20/2024 0521 by Geneva Steward CROME, RN Outcome: Progressing 07/20/2024 0521 by Geneva Steward CROME, RN Outcome: Progressing Goal: Ability to demonstrate self-control will improve 07/20/2024 0521 by Geneva Steward CROME, RN Outcome: Progressing 07/20/2024 0521 by Geneva Steward CROME, RN Outcome: Progressing   Problem: Health Behavior/Discharge Planning: Goal: Identification of resources available to assist in meeting health care needs will improve 07/20/2024 0521 by Geneva Steward CROME, RN Outcome: Progressing 07/20/2024 0521 by Geneva Steward CROME, RN Outcome: Progressing Goal: Compliance with treatment plan for underlying cause of condition will improve 07/20/2024 0521 by Geneva Steward CROME, RN Outcome: Progressing 07/20/2024 0521 by Geneva Steward CROME, RN Outcome:  Progressing   Problem: Physical Regulation: Goal: Ability to maintain clinical measurements within normal limits will improve 07/20/2024 0521 by Geneva Steward CROME, RN Outcome: Progressing 07/20/2024 0521 by Geneva Steward CROME, RN Outcome: Progressing   Problem: Safety: Goal: Periods of time without injury will increase 07/20/2024 0521 by Geneva Steward CROME, RN Outcome: Progressing 07/20/2024 0521 by Geneva Steward CROME, RN Outcome: Progressing   Problem: Education: Goal: Ability to state activities that reduce stress will improve 07/20/2024 0521 by Geneva Steward CROME, RN Outcome: Progressing 07/20/2024 0521 by Geneva Steward CROME, RN Outcome: Progressing   Problem: Self-Concept: Goal: Ability to identify factors that promote anxiety will improve 07/20/2024 0521 by Geneva Steward CROME, RN Outcome: Progressing 07/20/2024 0521 by Geneva Steward CROME, RN Outcome: Progressing Goal: Level of anxiety will decrease 07/20/2024 0521 by Geneva Steward CROME, RN Outcome: Progressing 07/20/2024 0521 by Geneva Steward CROME, RN Outcome: Progressing Goal: Ability to modify response to factors that promote anxiety will improve 07/20/2024 0521 by Geneva Steward CROME, RN Outcome: Progressing 07/20/2024 0521 by Geneva Steward CROME, RN Outcome: Progressing   Problem: Activity: Goal: Will verbalize the importance of balancing activity with adequate rest periods 07/20/2024 0521 by Geneva Steward CROME, RN Outcome: Progressing 07/20/2024 0521 by Geneva Steward CROME, RN Outcome: Progressing   "

## 2024-07-20 NOTE — Progress Notes (Signed)
 " PROGRESS NOTE    Valerie [name]  FMW:[id number] DOB: Dec 27, 1960 DOA: 06/19/2024 PCP: System, Provider Not In  Subjective: No acute events overnight. Seen and examined at bedside. No new complaints. Denies nausea, vomiting, constipation.   Hospital Course: Valerie [name] is a 64 y.o. female with past medical history of schizophrenia and bipolar disorder who was admitted at [facility] facility due to manic episode.  She was complaining of some intermittent chest pain, mostly involving upper retrosternal area, intermittent, no relationship with activity and no radiation.  No associated factors.  Sometimes pain increased with moving around her arms, worsening bilateral lower extremity edema, denies any shortness of breath orthopnea or PND.   Psych was also concerned about her fluctuating blood pressure going from borderline soft to little high. Psych was making some changes to her home medications.   12/30: Vitals and labs stable.  Troponin negative x 2. Echocardiogram done with pending results.  Small improvement in lower extremity edema so continuing p.o. Lasix .  Will need BMP every other day. 12/31.  Held Lasix  and lisinopril  secondary to low blood pressure.  Switched propranolol  over to Toprol -XL twice daily. 07/13/2024.  Blood pressure improved.  Will increase Lasix  to 40 mg orally twice daily for now.  Continue Toprol  XL. 1/2.  Continue Lasix  40 mg twice a day. 1/3.  Change diabetic medications over to Farxiga .  proBNP negative. 1/4.  Patient upset about some of the residents. 1/5.  Continue Lasix  twice a day. 1/6.  Patient wants to continue Lasix  twice a day.  I offered to decrease it down to once a day dosing.   Assessment and Plan:  Bilateral lower extremity edema Sonogram left lower extremity negative earlier in the hospital course.  proBNP negative. Continue Lasix  40 mg p.o. twice daily at patient request.  Continue Toprol -XL.  Patient does not want to wear compression stockings  on the psychiatric floor.    Sinus tachycardia Heart rate improved with changing propranolol  over to Toprol -XL twice daily.   Hypotension Resolved.   Schizophrenia (HCC) - Being managed by primary team.   Chest pain Cardiac enzymes negative.  Echocardiogram showed EF of 65%.  Likely GERD.  Patient on famotidine    Hypercholesteremia Continue home Crestor    Hyponatremia Sodium a few points less than the normal range.   Controlled type 2 diabetes mellitus without complication, without long-term current use of insulin (HCC) Change diabetes medication over to Farxiga  and get rid of metformin  and Tradjenta .  Last hemoglobin A1c 5.9   Schizoaffective disorder, bipolar type (HCC) Patient on Geodon , trazodone , clozapine , gabapentin .    Code Status: Full Code  Disposition Plan: As per primary team   Objective: Vitals:   07/19/24 0720 07/19/24 1927 07/20/24 0713 07/20/24 1346  BP: (!) 110/57 110/61 (!) 123/59 113/67  Pulse: 91 68 80 87  Resp: 18 14 16 16   Temp: (!) 97.2 F (36.2 C) (!) 97.5 F (36.4 C) 97.6 F (36.4 C) 97.6 F (36.4 C)  TempSrc:      SpO2: 100% 100% 100% 100%  Weight:      Height:       No intake or output data in the 24 hours ending 07/20/24 1352 Filed Weights   06/19/24 0725  Weight: 81 kg    Examination:  Physical Exam Vitals and nursing note reviewed.  Constitutional:      General: She is not in acute distress.    Appearance: She is not ill-appearing.  Cardiovascular:     Rate and Rhythm: Normal  rate and regular rhythm.     Pulses: Normal pulses.     Heart sounds: Normal heart sounds.  Pulmonary:     Effort: Pulmonary effort is normal.     Breath sounds: Normal breath sounds.  Abdominal:     General: Bowel sounds are normal.     Palpations: Abdomen is soft.  Musculoskeletal:     Right lower leg: Edema present.     Left lower leg: Edema present.  Neurological:     Mental Status: She is alert.     Data Reviewed: I have personally  reviewed following labs and imaging studies  CBC: Recent Labs  Lab 07/20/24 0634  WBC 7.2  NEUTROABS 3.2  HGB 11.2*  HCT 36.4  MCV 88.6  PLT 375   Basic Metabolic Panel: Recent Labs  Lab 07/15/24 0657 07/17/24 0930 07/19/24 0611  NA 139 139 138  K 4.0 4.1 4.5  CL 100 99 101  CO2 28 32 26  GLUCOSE 129* 116* 128*  BUN 15 14 18   CREATININE 0.66 0.72 0.68  CALCIUM  9.2 9.6 9.3  MG 2.2  --   --    GFR: Estimated Creatinine Clearance: 77.6 mL/min (by C-G formula based on SCr of 0.68 mg/dL). Liver Function Tests: No results for input(s): AST, ALT, ALKPHOS, BILITOT, PROT, ALBUMIN in the last 168 hours. No results for input(s): LIPASE, AMYLASE in the last 168 hours. No results for input(s): AMMONIA in the last 168 hours. Coagulation Profile: No results for input(s): INR, PROTIME in the last 168 hours. Cardiac Enzymes: No results for input(s): CKTOTAL, CKMB, CKMBINDEX, TROPONINI in the last 168 hours. ProBNP, BNP (last 5 results) Recent Labs    07/15/24 0657  PROBNP 178.0   HbA1C: No results for input(s): HGBA1C in the last 72 hours. CBG: Recent Labs  Lab 07/17/24 0620 07/18/24 0748 07/19/24 0556  GLUCAP 112* 110* 123*   Lipid Profile: No results for input(s): CHOL, HDL, LDLCALC, TRIG, CHOLHDL, LDLDIRECT in the last 72 hours. Thyroid Function Tests: No results for input(s): TSH, T4TOTAL, FREET4, T3FREE, THYROIDAB in the last 72 hours. Anemia Panel: No results for input(s): VITAMINB12, FOLATE, FERRITIN, TIBC, IRON, RETICCTPCT in the last 72 hours. Sepsis Labs: No results for input(s): PROCALCITON, LATICACIDVEN in the last 168 hours.  No results found for this or any previous visit (from the past 240 hours).   Radiology Studies: No results found.  Scheduled Meds:  aspirin  EC  81 mg Oral Daily   cholecalciferol   1,000 Units Oral Weekly   cloZAPine   100 mg Oral BID   Or   OLANZapine   10 mg  Intramuscular BID   dapagliflozin  propanediol  10 mg Oral Daily   DULoxetine   40 mg Oral Daily   famotidine   40 mg Oral Daily   furosemide   40 mg Oral 2 times per day   gabapentin   300 mg Oral TID   metoprolol  succinate  25 mg Oral BID   multivitamin with minerals  1 tablet Oral Daily   naproxen   375 mg Oral TID WC   potassium chloride  SA  20 mEq Oral BID   rosuvastatin   5 mg Oral Daily   ziprasidone   20 mg Oral QHS   zolpidem   10 mg Oral QHS   Continuous Infusions:   LOS: 31 days   Norval Bar, MD  [facility]  07/20/2024, 1:52 PM   "

## 2024-07-20 NOTE — Group Note (Signed)
 Recreation Therapy Group Note   Group Topic:Leisure Education  Group Date: 07/20/2024 Start Time: 1415 End Time: 1500 Facilitators: Celestia Jeoffrey BRAVO, LRT, CTRS Location: Courtyard  Group Description: Outdoor Recreation. Patients had the option to play corn hole, ring toss, UNO, or listening to music while outside in the courtyard getting fresh air and sunlight.  LRT and patients discussed things that they enjoy doing in their free time outside of the hospital. LRT encouraged patients to drink water after being active and getting their heart rate up.   Goal Area(s) Addressed: Patient will identify leisure interests.  Patient will practice healthy decision making. Patient will engage in recreation activity.   Affect/Mood: N/A   Participation Level: Did not attend    Clinical Observations/Individualized Feedback: Patient did not attend.  Plan: Continue to engage patient in RT group sessions 2-3x/week.   Jeoffrey BRAVO Celestia, LRT, CTRS 07/20/2024 4:41 PM

## 2024-07-20 NOTE — BH IP Treatment Plan (Signed)
 Interdisciplinary Treatment and Diagnostic Plan Update  07/20/2024 Time of Session: 3:00 PM  Valerie [name] MRN: [id number]  Principal Diagnosis: Bilateral lower extremity edema  Secondary Diagnoses: Principal Problem:   Bilateral lower extremity edema Active Problems:   Hypercholesteremia   Schizoaffective disorder, bipolar type (HCC)   Chest pain   Sinus tachycardia   Hypotension   Controlled type 2 diabetes mellitus without complication, without long-term current use of insulin (HCC)   Hyponatremia   Current Medications:  Current Facility-Administered Medications  Medication Dose Route Frequency Provider Last Rate Last Admin   acetaminophen  (TYLENOL ) tablet 650 mg  650 mg Oral Q6H PRN Onuoha, Chinwendu V, NP   650 mg at 07/11/24 1835   aspirin  EC tablet 81 mg  81 mg Oral Daily Bobbitt, Shalon E, NP   81 mg at 07/20/24 0931   cholecalciferol  (VITAMIN D3) 25 MCG (1000 UNIT) tablet 1,000 Units  1,000 Units Oral Weekly Jadapalle, Sree, MD   1,000 Units at 07/18/24 1232   cloZAPine  (CLOZARIL ) tablet 100 mg  100 mg Oral BID Jadapalle, Sree, MD   100 mg at 07/20/24 9067   Or   OLANZapine  (ZYPREXA ) injection 10 mg  10 mg Intramuscular BID Jadapalle, Sree, MD   10 mg at 07/05/24 2233   dapagliflozin  propanediol (FARXIGA ) tablet 10 mg  10 mg Oral Daily Josette Ade, MD   10 mg at 07/20/24 9065   diclofenac  Sodium (VOLTAREN ) 1 % topical gel 4 g  4 g Topical QID PRN Madaram, Kondal R, MD       DULoxetine  (CYMBALTA ) DR capsule 40 mg  40 mg Oral Daily Jadapalle, Sree, MD   40 mg at 07/20/24 0932   famotidine  (PEPCID ) tablet 40 mg  40 mg Oral Daily Shrivastava, Aryendra, MD   40 mg at 07/20/24 0931   fluticasone  (FLONASE ) 50 MCG/ACT nasal spray 1 spray  1 spray Each Nare Daily PRN Madaram, Kondal R, MD       furosemide  (LASIX ) tablet 40 mg  40 mg Oral 2 times per day Josette Ade, MD   40 mg at 07/20/24 0857   gabapentin  (NEURONTIN ) capsule 300 mg  300 mg Oral TID Bobbitt, Shalon E, NP    300 mg at 07/20/24 0931   hydrOXYzine  (ATARAX ) tablet 25 mg  25 mg Oral TID PRN Bobbitt, Shalon E, NP   25 mg at 07/11/24 1805   magnesium  hydroxide (MILK OF MAGNESIA) suspension 30 mL  30 mL Oral Daily PRN Bobbitt, Shalon E, NP       metoprolol  succinate (TOPROL -XL) 24 hr tablet 25 mg  25 mg Oral BID Josette Ade, MD   25 mg at 07/20/24 0931   multivitamin with minerals tablet 1 tablet  1 tablet Oral Daily Bobbitt, Shalon E, NP   1 tablet at 07/20/24 0932   naproxen  (NAPROSYN ) tablet 375 mg  375 mg Oral TID WC Jadapalle, Sree, MD   375 mg at 07/20/24 1257   OLANZapine  (ZYPREXA ) injection 5 mg  5 mg Intramuscular TID PRN Bobbitt, Shalon E, NP   5 mg at 06/22/24 2259   OLANZapine  zydis (ZYPREXA ) disintegrating tablet 5 mg  5 mg Oral TID PRN Bobbitt, Shalon E, NP   5 mg at 07/11/24 1835   ondansetron  (ZOFRAN ) tablet 4 mg  4 mg Oral Q8H PRN Bobbitt, Shalon E, NP   4 mg at 07/11/24 1835   potassium chloride  SA (KLOR-CON  M) CR tablet 20 mEq  20 mEq Oral BID Bobbitt, Shalon E, NP  20 mEq at 07/20/24 0930   rosuvastatin  (CRESTOR ) tablet 5 mg  5 mg Oral Daily Bobbitt, Shalon E, NP   5 mg at 07/20/24 0931   traZODone  (DESYREL ) tablet 50 mg  50 mg Oral QHS PRN Bobbitt, Shalon E, NP       ziprasidone  (GEODON ) capsule 20 mg  20 mg Oral QHS Jadapalle, Sree, MD   20 mg at 07/19/24 2122   zolpidem  (AMBIEN ) tablet 10 mg  10 mg Oral QHS Jadapalle, Sree, MD   10 mg at 07/19/24 2122   PTA Medications: Medications Prior to Admission  Medication Sig Dispense Refill Last Dose/Taking   acetaminophen  (TYLENOL ) 650 MG CR tablet Take 650 mg by mouth every 8 (eight) hours as needed for pain.      benztropine (COGENTIN) 0.5 MG tablet Take by mouth.      bisacodyl  5 MG EC tablet As directed for procedure 8 tablet 0    Cholecalciferol  (VITAMIN D ) 125 MCG (5000 UT) CAPS Take 1,000 Units by mouth once a week.      cloZAPine  (CLOZARIL ) 100 MG tablet Take 200 mg by mouth 2 (two) times daily.      DULoxetine  (CYMBALTA )  20 MG capsule Take 20 mg by mouth daily.      fluticasone  (FLONASE ) 50 MCG/ACT nasal spray Place 1 spray into both nostrils daily.      gabapentin  (NEURONTIN ) 300 MG capsule Take 300 mg by mouth 3 (three) times daily.      haloperidol  (HALDOL ) 0.5 MG tablet Take 0.5 mg by mouth 2 (two) times daily.      hydrochlorothiazide  (HYDRODIURIL ) 25 MG tablet Take 25 mg by mouth daily.      lisinopril  (ZESTRIL ) 30 MG tablet Take 30 mg by mouth daily.      loperamide (IMODIUM) 2 MG capsule Take 2 mg by mouth as needed for diarrhea or loose stools. Take 1 capsule by mouth as needed with each loose stool/diarrhea nte 8 doses in 24 hr      Multiple Vitamin (DAILY VITE PO) Take 1 tablet by mouth daily.      naproxen  (NAPROSYN ) 500 MG tablet Take 500 mg by mouth every 12 (twelve) hours as needed.      omeprazole (PRILOSEC) 20 MG capsule Take 20 mg by mouth daily.      ondansetron  (ZOFRAN ) 4 MG tablet Take 4 mg by mouth every 8 (eight) hours as needed for nausea or vomiting.      potassium chloride  SA (KLOR-CON  M) 20 MEQ tablet Take 20 mEq by mouth 2 (two) times daily.      propranolol  (INDERAL ) 40 MG tablet Take 40 mg by mouth 2 (two) times daily.      rosuvastatin  (CRESTOR ) 5 MG tablet Take 5 mg by mouth daily.      sodium phosphate  (FLEET) ENEM As directed for procedure 133 mL 0    triamcinolone ointment (KENALOG) 0.1 % Apply topically.       Patient Stressors: Medication change or noncompliance    Patient Strengths: Ability for insight   Treatment Modalities: Medication Management, Group therapy, Case management,  1 to 1 session with clinician, Psychoeducation, Recreational therapy.   Physician Treatment Plan for Primary Diagnosis: Bilateral lower extremity edema Long Term Goal(s): Improvement in symptoms so as ready for discharge   Short Term Goals: Ability to identify changes in lifestyle to reduce recurrence of condition will improve Ability to verbalize feelings will improve Ability to disclose  and discuss suicidal ideas Ability to demonstrate  self-control will improve Ability to identify and develop effective coping behaviors will improve  Medication Management: Evaluate patient's response, side effects, and tolerance of medication regimen.  Therapeutic Interventions: 1 to 1 sessions, Unit Group sessions and Medication administration.  Evaluation of Outcomes: Not Progressing  Physician Treatment Plan for Secondary Diagnosis: Principal Problem:   Bilateral lower extremity edema Active Problems:   Hypercholesteremia   Schizoaffective disorder, bipolar type (HCC)   Chest pain   Sinus tachycardia   Hypotension   Controlled type 2 diabetes mellitus without complication, without long-term current use of insulin (HCC)   Hyponatremia  Long Term Goal(s): Improvement in symptoms so as ready for discharge   Short Term Goals: Ability to identify changes in lifestyle to reduce recurrence of condition will improve Ability to verbalize feelings will improve Ability to disclose and discuss suicidal ideas Ability to demonstrate self-control will improve Ability to identify and develop effective coping behaviors will improve     Medication Management: Evaluate patient's response, side effects, and tolerance of medication regimen.  Therapeutic Interventions: 1 to 1 sessions, Unit Group sessions and Medication administration.  Evaluation of Outcomes: Not Progressing   RN Treatment Plan for Primary Diagnosis: Bilateral lower extremity edema Long Term Goal(s): Knowledge of disease and therapeutic regimen to maintain health will improve  Short Term Goals: Ability to remain free from injury will improve, Ability to verbalize frustration and anger appropriately will improve, Ability to demonstrate self-control, Ability to participate in decision making will improve, Ability to verbalize feelings will improve, Ability to disclose and discuss suicidal ideas, Ability to identify and develop  effective coping behaviors will improve, and Compliance with prescribed medications will improve  Medication Management: RN will administer medications as ordered by provider, will assess and evaluate patient's response and provide education to patient for prescribed medication. RN will report any adverse and/or side effects to prescribing provider.  Therapeutic Interventions: 1 on 1 counseling sessions, Psychoeducation, Medication administration, Evaluate responses to treatment, Monitor vital signs and CBGs as ordered, Perform/monitor CIWA, COWS, AIMS and Fall Risk screenings as ordered, Perform wound care treatments as ordered.  Evaluation of Outcomes: Not Progressing   LCSW Treatment Plan for Primary Diagnosis: Bilateral lower extremity edema Long Term Goal(s): Safe transition to appropriate next level of care at discharge, Engage patient in therapeutic group addressing interpersonal concerns.  Short Term Goals: Engage patient in aftercare planning with referrals and resources, Increase social support, Increase ability to appropriately verbalize feelings, Increase emotional regulation, Facilitate acceptance of mental health diagnosis and concerns, Facilitate patient progression through stages of change regarding substance use diagnoses and concerns, Identify triggers associated with mental health/substance abuse issues, and Increase skills for wellness and recovery  Therapeutic Interventions: Assess for all discharge needs, 1 to 1 time with Social worker, Explore available resources and support systems, Assess for adequacy in community support network, Educate family and significant other(s) on suicide prevention, Complete Psychosocial Assessment, Interpersonal group therapy.  Evaluation of Outcomes: Not Progressing   Progress in Treatment: Attending groups: Yes. and No. Participating in groups: Yes. and No. Taking medication as prescribed: Yes. Toleration medication:  Yes. Family/Significant other contact made: Yes, individual(s) contacted:  Yes, pt's sister and mother  Patient understands diagnosis: No. Discussing patient identified problems/goals with staff: Yes. Medical problems stabilized or resolved: Yes. Denies suicidal/homicidal ideation: Yes. Issues/concerns per patient self-inventory: Yes. Other: none   New problem(s) identified: No, Describe:  none   New Short Term/Long Term Goal(s):elimination of symptoms of psychosis, medication management for mood stabilization;  elimination of SI thoughts; development of comprehensive mental wellness plan. 06/29/24 Update: No changes at this time. Update 07/04/24: No changes at this time Update 07/10/24: No changes at this time Update 07/10/24: No changes at this time 07/15/24 no changes Update 07/20/24: No changes at this time 07/15/24 no changes   Patient Goals:    To continue to get well 06/29/24 Update: No changes at this time. Update 07/04/24: No changes at this time Update 07/10/24: No changes at this time Update 07/10/24: No changes at this time 07/15/24 no changes Update 07/20/24: No changes at this time 07/15/24 no changes   Discharge Plan or Barriers: CSW will assist with appropriate discharge planning 06/29/24 Update:  CSW has made APS report to [facility] APs following safety concerns. CSW to continue to coordinate with DSS worker to continue to assess. Update 07/04/24: No changes at this time Update 07/10/24: No changes at this time Update 07/10/24: DSS reports no changes at this time, DSS unsure if pt's family has filed for the guardianship. Caseworker Adams Louder reports she will check in with CSW after New Years 07/15/2024 no changes Update 07/20/24: Pt's family filed for guardianship, court date scheduled for 08/25/23   Reason for Continuation of Hospitalization: Mania Medication stabilization   Estimated Length of Stay: TBD 07/15/24 Update 07/20/24: TBD  Last 3 [place] Suicide Severity Risk  Score: Flowsheet Row Admission (Current) from [facility]/19/2024 in [facility] from 06/18/2024 in [facility] Emergency Department at [facility] ED from 05/24/2022 in [facility] Emergency Department at [facility]  C-SSRS RISK CATEGORY No Risk No Risk No Risk    Last PHQ 2/9 Scores:    12/21/2022    9:00 AM  Depression screen PHQ 2/9  Decreased Interest 0  Down, Depressed, Hopeless 0  PHQ - 2 Score 0    Scribe for Treatment Team: Lum JONETTA Raynaldo ISRAEL 07/20/2024 3:53 PM

## 2024-07-20 NOTE — Plan of Care (Addendum)
 Pt's mood remains labile with loud, pressured and abusive speech with intermittent cursing and loud singing. Disruptive in scheduled groups, requiring multiple verbal redirections but ineffective as behavior continued to escalate Kill all them fucking bitches. PRN Vistaril  25 mg given at 1712 with desired effect when reassessed. Tolerates medications, meals and fluids well. Safety checks maintained at Q 15 minutes intervals without falls Emotional support, encouragement and reassurance offered.   Problem: Coping: Goal: Ability to verbalize frustrations and anger appropriately will improve Outcome: Not Progressing Goal: Ability to demonstrate self-control will improve Outcome: Not Progressing

## 2024-07-20 NOTE — Progress Notes (Signed)
 [facility] MD Progress Note   12:29 PM Valerie [name]  MRN:  [id number]   Valerie [name] is a 64 year old female presenting voluntary to [facility] requesting a psychiatric evaluation for worsening psychosis. Patient has history of schizophrenia. Patient denied SI, HI, psychosis and alcohol/drug usage. Patient resides at [facility]. Patient reports people are coming by my door talking loud, yelling and screaming and coming in my room with keys. Patient reports having multiple medical Afibs and blood clots due to this. Patient reports people are doing things in the dining room. When asked if she called EMS, patient stated I called EMS, I stripped searched over the phone and called the authorities. Patient is admitted to [facility] unit with Q15 min safety monitoring. Multidisciplinary team approach is offered. Medication management; group/milieu therapy is offered.   [name] : [phone]- called on 07/07/24 went to generic voicemail-sister called back and provider discussed at length the current treatment plan of patient being on Clozaril -intermittently refusing, enforced with Zyprexa  IM and recent and addition of Geodon .  Due to patient's ongoing poor response to medications and ongoing delusions discussed the plan of switching Clozaril  to another antipsychotic that comes in LAI form.  Clozaril  may not be the best option for patients who are noncompliant and refusing labs.  Sister is agreeable with the plan  Subjective:  Chart reviewed, case discussed in multidisciplinary meeting, patient seen during rounds.   Patient is noted to be very hyperactive today with poor boundaries.  She insisted to have the provider and made inappropriate statements.  Patient had to be educated about personal boundaries.  Patient is also noted to be doing random screening sounds for no apparent reason.  She had to be redirected as she was in the personal space of other patients.  She  reportedly took her medications today.  She denies SI/HI and denies hallucinations.  Past Psychiatric History: see h&P Family History:  Family History  Problem Relation Age of Onset   Colon cancer Neg Hx    Celiac disease Neg Hx    Inflammatory bowel disease Neg Hx    Social History:  Social History   Substance and Sexual Activity  Alcohol Use Never     Social History   Substance and Sexual Activity  Drug Use Never    Social History   Socioeconomic History   Marital status: Single    Spouse name: Not on file   Number of children: Not on file   Years of education: Not on file   Highest education level: Not on file  Occupational History   Not on file  Tobacco Use   Smoking status: Never   Smokeless tobacco: Never  Substance and Sexual Activity   Alcohol use: Never   Drug use: Never   Sexual activity: Not Currently    Birth control/protection: Pill  Other Topics Concern   Not on file  Social History Narrative   Not on file   Social Drivers of Health   Tobacco Use: Low Risk (06/19/2024)   Patient History    Smoking Tobacco Use: Never    Smokeless Tobacco Use: Never    Passive Exposure: Not on file  Financial Resource Strain: Not on file  Food Insecurity: Unknown (06/19/2024)   Epic    Worried About Programme Researcher, Broadcasting/film/video in the Last Year: Never true    Ran Out of Food in the Last Year: Patient declined  Transportation Needs: No Transportation Needs (06/19/2024)  Epic    Lack of Transportation (Medical): No    Lack of Transportation (Non-Medical): No  Physical Activity: Not on file  Stress: Not on file  Social Connections: Not on file  Depression (PHQ2-9): Low Risk (12/21/2022)   Depression (PHQ2-9)    PHQ-2 Score: 0  Alcohol Screen: Low Risk (06/19/2024)   Alcohol Screen    Last Alcohol Screening Score (AUDIT): 0  Housing: Low Risk (06/19/2024)   Epic    Unable to Pay for Housing in the Last Year: No    Number of Times Moved in the Last Year: 0    Homeless  in the Last Year: No  Utilities: Not At Risk (06/19/2024)   Epic    Threatened with loss of utilities: No  Health Literacy: Not on file   Past Medical History:  Past Medical History:  Diagnosis Date   Chronic lower extremity pain (2ry area of Pain) (Right) 12/21/2022   Chronic pain syndrome 12/20/2022   DDD (degenerative disc disease), 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 Tobie: Pancolonic diverticulosis, tortuous colon, next colonoscopy 2024   TONSILLECTOMY      Current Medications: Current Facility-Administered Medications  Medication Dose Route Frequency Provider Last Rate Last Admin   acetaminophen  (TYLENOL ) tablet 650 mg  650 mg Oral Q6H PRN Onuoha, Chinwendu V, NP   650 mg at 07/11/24 1835   aspirin  EC tablet 81 mg  81 mg Oral Daily Bobbitt, Shalon E, NP   81 mg at 07/20/24 0931   cholecalciferol  (VITAMIN D3) 25 MCG (1000 UNIT) tablet 1,000 Units  1,000 Units Oral Weekly Ignatz Deis, MD   1,000 Units at 07/18/24 1232   cloZAPine  (CLOZARIL ) tablet 100 mg  100 mg Oral BID Ali Mohl, MD   100 mg at 07/20/24 9067   Or   OLANZapine  (ZYPREXA ) injection 10 mg  10 mg Intramuscular BID Zarria Towell, MD   10 mg at 07/05/24 2233   dapagliflozin  propanediol (FARXIGA ) tablet 10 mg  10 mg Oral Daily Josette Ade, MD   10 mg at 07/20/24 9065   diclofenac  Sodium (VOLTAREN ) 1 % topical gel 4 g  4 g Topical QID PRN Madaram, Kondal R, MD       DULoxetine  (CYMBALTA ) DR capsule 40 mg  40 mg Oral Daily Gwyneth Fernandez, MD   40 mg at 07/20/24 0932   famotidine  (PEPCID ) tablet 40 mg  40 mg Oral Daily Shrivastava, Aryendra, MD   40 mg at 07/20/24 0931   fluticasone  (FLONASE ) 50 MCG/ACT nasal spray 1 spray  1 spray Each Nare Daily PRN Madaram, Kondal R, MD       furosemide  (LASIX ) tablet 40 mg  40 mg Oral 2 times per day Josette Ade,  MD   40 mg at 07/20/24 0857   gabapentin  (NEURONTIN ) capsule 300 mg  300 mg Oral TID Bobbitt, Shalon E, NP   300 mg at 07/20/24 0931   hydrOXYzine  (ATARAX ) tablet 25 mg  25 mg Oral TID PRN Bobbitt, Shalon E, NP   25 mg at 07/11/24 1805   magnesium  hydroxide (MILK OF MAGNESIA) suspension 30 mL  30 mL Oral Daily PRN Bobbitt, Shalon E, NP       metoprolol  succinate (TOPROL -XL) 24 hr tablet 25 mg  25 mg Oral BID Josette Ade, MD   25 mg at  07/20/24 0931   multivitamin with minerals tablet 1 tablet  1 tablet Oral Daily Bobbitt, Shalon E, NP   1 tablet at 07/20/24 0932   naproxen  (NAPROSYN ) tablet 375 mg  375 mg Oral TID WC Bianka Liberati, MD   375 mg at 07/20/24 0857   OLANZapine  (ZYPREXA ) injection 5 mg  5 mg Intramuscular TID PRN Bobbitt, Shalon E, NP   5 mg at 06/22/24 2259   OLANZapine  zydis (ZYPREXA ) disintegrating tablet 5 mg  5 mg Oral TID PRN Bobbitt, Shalon E, NP   5 mg at 07/11/24 1835   ondansetron  (ZOFRAN ) tablet 4 mg  4 mg Oral Q8H PRN Bobbitt, Shalon E, NP   4 mg at 07/11/24 1835   potassium chloride  SA (KLOR-CON  M) CR tablet 20 mEq  20 mEq Oral BID Bobbitt, Shalon E, NP   20 mEq at 07/20/24 0930   rosuvastatin  (CRESTOR ) tablet 5 mg  5 mg Oral Daily Bobbitt, Shalon E, NP   5 mg at 07/20/24 0931   traZODone  (DESYREL ) tablet 50 mg  50 mg Oral QHS PRN Bobbitt, Shalon E, NP       ziprasidone  (GEODON ) capsule 20 mg  20 mg Oral QHS Mell Mellott, MD   20 mg at 07/19/24 2122   zolpidem  (AMBIEN ) tablet 10 mg  10 mg Oral QHS Jaima Janney, MD   10 mg at 07/19/24 2122    Lab Results:  Results for orders placed or performed during the hospital encounter of 06/19/24 (from the past 48 hours)  Glucose, capillary     Status: Abnormal   Collection Time: 07/19/24  5:56 AM  Result Value Ref Range   Glucose-Capillary 123 (H) 70 - 99 mg/dL    Comment: Glucose reference range applies only to samples taken after fasting for at least 8 hours.  Basic metabolic panel     Status: Abnormal    Collection Time: 07/19/24  6:11 AM  Result Value Ref Range   Sodium 138 135 - 145 mmol/L   Potassium 4.5 3.5 - 5.1 mmol/L    Comment: HEMOLYSIS AT THIS LEVEL MAY AFFECT RESULT   Chloride 101 98 - 111 mmol/L   CO2 26 22 - 32 mmol/L   Glucose, Bld 128 (H) 70 - 99 mg/dL    Comment: Glucose reference range applies only to samples taken after fasting for at least 8 hours.   BUN 18 8 - 23 mg/dL   Creatinine, Ser 9.31 0.44 - 1.00 mg/dL   Calcium  9.3 8.9 - 10.3 mg/dL   GFR, Estimated >39 >39 mL/min    Comment: (NOTE) Calculated using the CKD-EPI Creatinine Equation (2021)    Anion gap 11 5 - 15    Comment: Performed at [facility], [address] [place], [place] [address]  CBC with Differential/Platelet     Status: Abnormal   Collection Time: 07/20/24  6:34 AM  Result Value Ref Range   WBC 7.2 4.0 - 10.5 K/uL   RBC 4.11 3.87 - 5.11 MIL/uL   Hemoglobin 11.2 (L) 12.0 - 15.0 g/dL   HCT 63.5 63.9 - 53.9 %   MCV 88.6 80.0 - 100.0 fL   MCH 27.3 26.0 - 34.0 pg   MCHC 30.8 30.0 - 36.0 g/dL   RDW 86.3 88.4 - 84.4 %   Platelets 375 150 - 400 K/uL   nRBC 0.0 0.0 - 0.2 %   Neutrophils Relative % 44 %   Neutro Abs 3.2 1.7 - 7.7 K/uL   Lymphocytes Relative  40 %   Lymphs Abs 2.8 0.7 - 4.0 K/uL   Monocytes Relative 11 %   Monocytes Absolute 0.8 0.1 - 1.0 K/uL   Eosinophils Relative 4 %   Eosinophils Absolute 0.3 0.0 - 0.5 K/uL   Basophils Relative 1 %   Basophils Absolute 0.1 0.0 - 0.1 K/uL   Immature Granulocytes 0 %   Abs Immature Granulocytes 0.02 0.00 - 0.07 K/uL    Comment: Performed at [facility], [address] [place], [place] [address]        Blood Alcohol level:  Lab Results  Component Value Date   [facility] <15 06/18/2024    Metabolic Disorder Labs: Lab Results  Component Value Date   HGBA1C 5.9 (H) 06/28/2024   MPG 122.63 06/28/2024   No results found for: PROLACTIN Lab Results  Component Value Date   CHOL 156 06/28/2024   TRIG 89 06/28/2024    HDL 70 06/28/2024   CHOLHDL 2.2 06/28/2024   VLDL 18 06/28/2024   LDLCALC 69 06/28/2024    Physical Findings: AIMS:  , ,  ,  ,    CIWA:    COWS:      Psychiatric Specialty Exam:  Presentation  General Appearance:  Casual  Eye Contact: Fleeting  Speech: Normal Rate  Speech Volume: Increased    Mood and Affect  Mood: Euphoric  Affect: stable  Thought Process  Thought Processes: Improving illogical  Orientation:Full (Time, Place and Person)  Thought Content:Illogical; Delusions; Tangential  Hallucinations: Denies  Ideas of Reference:Delusions  Suicidal Thoughts: Denies  Homicidal Thoughts: Denies   Sensorium  Memory: Immediate Fair; Remote Fair  Judgment: Impaired  Insight: Shallow   Executive Functions  Concentration: Poor  Attention Span: Poor  Recall: [employer] of Knowledge: Fair  Language: Fair   Psychomotor Activity  Psychomotor Activity: No data recorded  Musculoskeletal: Strength & Muscle Tone: within normal limits Gait & Station: normal Assets  Assets: Manufacturing Systems Engineer; Desire for Improvement    Physical Exam: Physical Exam Vitals and nursing note reviewed.    ROS Blood pressure (!) 123/59, pulse 80, temperature 97.6 F (36.4 C), resp. rate 16, height 5' 5 (1.651 m), weight 81 kg, SpO2 100%. Body mass index is 29.7 kg/m.  Diagnosis: Principal Problem:   Bilateral lower extremity edema Active Problems:   Hypercholesteremia   Schizoaffective disorder, bipolar type (HCC)   Chest pain   Sinus tachycardia   Hypotension   Controlled type 2 diabetes mellitus without complication, without long-term current use of insulin (HCC)   Hyponatremia  Schizoaffective disorder bipolar type  Clinical Decision Making: Patient is currently admitted for worsening psychosis, manic symptoms, tangential grandiosity.  She needs inpatient hospitalization for medication management and close monitoring.  Patient is  unable to make decisions on her own as she is unable to appreciate and understand her mental health history, the current need for treatment.  [facility] has screened her case and for further evaluation for legal guardianship. [facility] screened in her case. Sister and mom are involved in her care.  Treatment Plan Summary:  Safety and Monitoring:             -- inoluntary admission to inpatient psychiatric unit for safety, stabilization and treatment             -- Daily contact with patient to assess and evaluate symptoms and progress in treatment             -- Patient's case to be discussed in multi-disciplinary  team meeting             -- Observation Level: q15 minute checks             -- Vital signs:  q12 hours             -- Precautions: suicide, elopement, and assault   2. Psychiatric Diagnoses and Treatment:  Discontinue Depakote  as patient is noncompliant Geodon  40 mg Clozaril  100 mg twice daily enforced with zyprexa  10mg  BID- Cymbalta  40 mg daily   -- The risks/benefits/side-effects/alternatives to this medication were discussed in detail with the patient and time was given for questions. The patient consents to medication trial.                -- Metabolic profile and EKG monitoring obtained while on an atypical antipsychotic (BMI: Lipid Panel: HbgA1c: QTc:)              -- Encouraged patient to participate in unit milieu and in scheduled group therapies                            3. Medical Issues Being Addressed:   Chest pain Sinus tachycardia HTN (hypertension) Bilateral lower extremity edema Hypercholesteremia  12/30:  Troponin negative x 2. Echocardiogram done with pending results.  Small improvement in lower extremity edema so continuing p.o. Lasix .  Will need BMP every other day.  4. Discharge Planning:   -- Social work and case management to assist with discharge planning and identification of hospital follow-up needs prior to discharge  -- Estimated LOS: 3-4 days  Elin Seats, MD 07/20/2024, 12:29 PM

## 2024-07-20 NOTE — [facility] Group Notes (Signed)
 Spirituality Group   Description: Participant directed exploration of values, beliefs and meaning   **Focus on how we generate hope and sit with uncertainty   Following a brief framework of chaplains role and ground rules of group behavior, participants are invited to share concerns or questions that engage spiritual life. Emphasis placed on common themes and shared experiences and ways to make meaning and clarify living into ones values.   Theory/Process/Goal: Utilize the theoretical framework of group therapy established by Celena Kite, Relational Cultural Theory and Rogerian approaches to facilitate relational empathy and use of the here and now to foster reflection, self-awareness, and sharing.   Observations: Ms Arseneau was an active particpant in group, primarily she wanted to sing. This chaplain asked for her to limit or postpone this impulse and she was able to cooperate.  Manuella Blackson L. Delores HERO.Div

## 2024-07-20 NOTE — Group Note (Signed)
 Date:  07/20/2024 Time:  3:48 PM  Group Topic/Focus:  Movement Therapy, Getting fit with Tesha Archambeau.    Participation Level:  Did Not Attend    Valerie [name] Bias 07/20/2024, 3:48 PM

## 2024-07-21 NOTE — Plan of Care (Signed)
" °  Problem: Education: Goal: Knowledge of [employer] General Education information/materials will improve Outcome: Not Progressing Goal: Emotional status will improve Outcome: Not Progressing Goal: Mental status will improve Outcome: Not Progressing Goal: Verbalization of understanding the information provided will improve Outcome: Not Progressing   Problem: Activity: Goal: Interest or engagement in activities will improve Outcome: Not Progressing Goal: Sleeping patterns will improve Outcome: Not Progressing   Problem: Coping: Goal: Ability to verbalize frustrations and anger appropriately will improve Outcome: Not Progressing Goal: Ability to demonstrate self-control will improve Outcome: Not Progressing   Problem: Safety: Goal: Periods of time without injury will increase Outcome: Not Progressing   Problem: Education: Goal: Ability to state activities that reduce stress will improve Outcome: Not Progressing   "

## 2024-07-21 NOTE — Group Note (Signed)
 Physical/Occupational Therapy Group Note  Group Topic: UE Therex   Group Date: 07/21/2024 Start Time: 1305 End Time: 1335 Facilitators: Clive Warren CROME, OT   Group Description: Group instructed in series of upper extremities exercises, aimed to promote strength, flexibility, range of motion and functional endurance.  Patients provided cuing for proper mechanics and proper pace of exercise; exercises adjusted as necessary for individualized patient needs.  Patient also engaged in cognitive components throughout session, working to integrate attention to task, command following, turn-taking and appropriate social interaction throughout session.  Allowed to ask questions as appropriate, and encouraged to identify specific exercises that they could complete independently outside of group sessions.  Therapeutic Goal(s):  Demonstrate appropriate performance of upper extremity exercises to promote strength, flexibility, range of motion and functional endurance Identify 2-3 specific upper extremity exercises to complete as home exercise program outside of group session  Individual Participation: Pt leaves day room at start of session, returns and sits at the far table briefly before pulling up a chair midway into the room. Pt declined copy of the exercise handout and a theraband, agreeable to engage in some seated exercises without. Was able to return demo fair technique for a few UE ex. Promptly exits room again and then saw in the hall outside the nurses station singing. Pt appeared engaged but very little carryover noted.  Participation Level: Minimal   Participation Quality: Moderate Cues   Behavior: Alert, Appropriate, and Calm   Speech/Thought Process: Distracted   Affect/Mood: Appropriate, Explosive, and Happy   Insight: Impaired   Judgement: Poor   Modes of Intervention: Activity, Clarification, Discussion, Education, Exploration, Problem-solving, Rapport Building, Socialization, and  Support  Patient Response to Interventions:  Avoidant and Disengaged   Plan: Continue to engage patient in PT/OT groups 1 - 2x/week.  This entire patient session was guided, instructed, and supervised by this clinician.  The note, response to treatment, and overall treatment plan has been reviewed and this clinician agrees with the information provided.  Choya Tornow L. Kathyjo Briere, MPH, MS, OTR/L 07/21/2024, 3:13 PM [phone]

## 2024-07-21 NOTE — Group Note (Signed)
 Date:  07/21/2024 Time:  10:40 AM  Group Topic/Focus:  Coping With Mental Health Crisis:   The purpose of this group is to help patients identify strategies for coping with mental health crisis.  Group discusses possible causes of crisis and ways to manage them effectively.    Participation Level:  Did Not Attend   [name] 07/21/2024, 10:40 AM

## 2024-07-21 NOTE — Progress Notes (Signed)
 [facility] MD Progress Note   1:42 PM Valerie [name]  MRN:  [id number]   Valerie [name] is a 64 year old female presenting voluntary to [facility] requesting a psychiatric evaluation for worsening psychosis. Patient has history of schizophrenia. Patient denied SI, HI, psychosis and alcohol/drug usage. Patient resides at [facility]. Patient reports people are coming by my door talking loud, yelling and screaming and coming in my room with keys. Patient reports having multiple medical Afibs and blood clots due to this. Patient reports people are doing things in the dining room. When asked if she called EMS, patient stated I called EMS, I stripped searched over the phone and called the authorities. Patient is admitted to [facility] unit with Q15 min safety monitoring. Multidisciplinary team approach is offered. Medication management; group/milieu therapy is offered.   Sister Gioia Ranes : [phone]- called on 07/07/24 went to generic voicemail-sister called back and provider discussed at length the current treatment plan of patient being on Clozaril -intermittently refusing, enforced with Zyprexa  IM and recent and addition of Geodon .  Due to patient's ongoing poor response to medications and ongoing delusions discussed the plan of switching Clozaril  to another antipsychotic that comes in LAI form.  Clozaril  may not be the best option for patients who are noncompliant and refusing labs.  Sister is agreeable with the plan  Subjective:  Chart reviewed, case discussed in multidisciplinary meeting, patient seen during rounds.   Today patient is noted to be calm sitting in the day area.  She offers no complaints.  She reports fair appetite and sleep.  She acknowledged taking Geodon  40 mg and Clozaril .  She denies SI/HI/plan and denies racing thoughts.  She remains discharge focused and request the provider that she is ready to go home.  Patient is aware that the group home she came from is  not accepting her back and that her family is looking for other places.  Past Psychiatric History: see h&P Family History:  Family History  Problem Relation Age of Onset   Colon cancer Neg Hx    Celiac disease Neg Hx    Inflammatory bowel disease Neg Hx    Social History:  Social History   Substance and Sexual Activity  Alcohol Use Never     Social History   Substance and Sexual Activity  Drug Use Never    Social History   Socioeconomic History   Marital status: Single    Spouse name: Not on file   Number of children: Not on file   Years of education: Not on file   Highest education level: Not on file  Occupational History   Not on file  Tobacco Use   Smoking status: Never   Smokeless tobacco: Never  Substance and Sexual Activity   Alcohol use: Never   Drug use: Never   Sexual activity: Not Currently    Birth control/protection: Pill  Other Topics Concern   Not on file  Social History Narrative   Not on file   Social Drivers of Health   Tobacco Use: Low Risk (06/19/2024)   Patient History    Smoking Tobacco Use: Never    Smokeless Tobacco Use: Never    Passive Exposure: Not on file  Financial Resource Strain: Not on file  Food Insecurity: Unknown (06/19/2024)   Epic    Worried About Programme Researcher, Broadcasting/film/video in the Last Year: Never true    [employer] of Food in the Last Year: Patient declined  Transportation Needs:  No Transportation Needs (06/19/2024)   Epic    Lack of Transportation (Medical): No    Lack of Transportation (Non-Medical): No  Physical Activity: Not on file  Stress: Not on file  Social Connections: Not on file  Depression (PHQ2-9): Low Risk (12/21/2022)   Depression (PHQ2-9)    PHQ-2 Score: 0  Alcohol Screen: Low Risk (06/19/2024)   Alcohol Screen    Last Alcohol Screening Score (AUDIT): 0  Housing: Low Risk (06/19/2024)   Epic    Unable to Pay for Housing in the Last Year: No    Number of Times Moved in the Last Year: 0    Homeless in the Last  Year: No  Utilities: Not At Risk (06/19/2024)   Epic    Threatened with loss of utilities: No  Health Literacy: Not on file   Past Medical History:  Past Medical History:  Diagnosis Date   Chronic lower extremity pain (2ry area of Pain) (Right) 12/21/2022   Chronic pain syndrome 12/20/2022   DDD (degenerative disc disease), 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 Tobie: Pancolonic diverticulosis, tortuous colon, next colonoscopy 2024   TONSILLECTOMY      Current Medications: Current Facility-Administered Medications  Medication Dose Route Frequency Provider Last Rate Last Admin   acetaminophen  (TYLENOL ) tablet 650 mg  650 mg Oral Q6H PRN Onuoha, Chinwendu V, NP   650 mg at 07/11/24 1835   aspirin  EC tablet 81 mg  81 mg Oral Daily Bobbitt, Shalon E, NP   81 mg at 07/21/24 1019   cholecalciferol  (VITAMIN D3) 25 MCG (1000 UNIT) tablet 1,000 Units  1,000 Units Oral Weekly Kynsley Whitehouse, MD   1,000 Units at 07/18/24 1232   cloZAPine  (CLOZARIL ) tablet 100 mg  100 mg Oral BID Mckenzie Toruno, MD   100 mg at 07/21/24 1016   Or   OLANZapine  (ZYPREXA ) injection 10 mg  10 mg Intramuscular BID Searcy Miyoshi, MD   10 mg at 07/05/24 2233   dapagliflozin  propanediol (FARXIGA ) tablet 10 mg  10 mg Oral Daily Josette Ade, MD   10 mg at 07/21/24 1016   diclofenac  Sodium (VOLTAREN ) 1 % topical gel 4 g  4 g Topical QID PRN Madaram, Kondal R, MD       DULoxetine  (CYMBALTA ) DR capsule 40 mg  40 mg Oral Daily Mileena Rothenberger, MD   40 mg at 07/21/24 1016   famotidine  (PEPCID ) tablet 40 mg  40 mg Oral Daily Shrivastava, Aryendra, MD   40 mg at 07/21/24 1020   fluticasone  (FLONASE ) 50 MCG/ACT nasal spray 1 spray  1 spray Each Nare Daily PRN Madaram, Kondal R, MD       furosemide  (LASIX ) tablet 40 mg  40 mg Oral 2 times per day Josette Ade, MD   40 mg  at 07/21/24 0805   gabapentin  (NEURONTIN ) capsule 300 mg  300 mg Oral TID Bobbitt, Shalon E, NP   300 mg at 07/21/24 1020   hydrOXYzine  (ATARAX ) tablet 25 mg  25 mg Oral TID PRN Bobbitt, Shalon E, NP   25 mg at 07/20/24 1712   magnesium  hydroxide (MILK OF MAGNESIA) suspension 30 mL  30 mL Oral Daily PRN Bobbitt, Shalon E, NP       metoprolol  succinate (TOPROL -XL) 24 hr tablet 25 mg  25 mg Oral BID Wieting, Richard,  MD   25 mg at 07/21/24 1018   multivitamin with minerals tablet 1 tablet  1 tablet Oral Daily Bobbitt, Shalon E, NP   1 tablet at 07/21/24 1020   naproxen  (NAPROSYN ) tablet 375 mg  375 mg Oral TID WC Joaquim Tolen, MD   375 mg at 07/21/24 1215   OLANZapine  (ZYPREXA ) injection 5 mg  5 mg Intramuscular TID PRN Bobbitt, Shalon E, NP   5 mg at 06/22/24 2259   OLANZapine  zydis (ZYPREXA ) disintegrating tablet 5 mg  5 mg Oral TID PRN Bobbitt, Shalon E, NP   5 mg at 07/11/24 1835   ondansetron  (ZOFRAN ) tablet 4 mg  4 mg Oral Q8H PRN Bobbitt, Shalon E, NP   4 mg at 07/11/24 1835   potassium chloride  SA (KLOR-CON  M) CR tablet 20 mEq  20 mEq Oral BID Bobbitt, Shalon E, NP   20 mEq at 07/21/24 1019   rosuvastatin  (CRESTOR ) tablet 5 mg  5 mg Oral Daily Bobbitt, Shalon E, NP   5 mg at 07/21/24 1018   traZODone  (DESYREL ) tablet 50 mg  50 mg Oral QHS PRN Bobbitt, Shalon E, NP   50 mg at 07/20/24 2127   ziprasidone  (GEODON ) capsule 20 mg  20 mg Oral QHS Chayne Baumgart, MD   20 mg at 07/20/24 2127   zolpidem  (AMBIEN ) tablet 10 mg  10 mg Oral QHS Reign Dziuba, MD   10 mg at 07/20/24 2126    Lab Results:  Results for orders placed or performed during the hospital encounter of 06/19/24 (from the past 48 hours)  CBC with Differential/Platelet     Status: Abnormal   Collection Time: 07/20/24  6:34 AM  Result Value Ref Range   WBC 7.2 4.0 - 10.5 K/uL   RBC 4.11 3.87 - 5.11 MIL/uL   Hemoglobin 11.2 (L) 12.0 - 15.0 g/dL   HCT 63.5 63.9 - 53.9 %   MCV 88.6 80.0 - 100.0 fL   MCH 27.3 26.0 - 34.0 pg    MCHC 30.8 30.0 - 36.0 g/dL   RDW 86.3 88.4 - 84.4 %   Platelets 375 150 - 400 K/uL   nRBC 0.0 0.0 - 0.2 %   Neutrophils Relative % 44 %   Neutro Abs 3.2 1.7 - 7.7 K/uL   Lymphocytes Relative 40 %   Lymphs Abs 2.8 0.7 - 4.0 K/uL   Monocytes Relative 11 %   Monocytes Absolute 0.8 0.1 - 1.0 K/uL   Eosinophils Relative 4 %   Eosinophils Absolute 0.3 0.0 - 0.5 K/uL   Basophils Relative 1 %   Basophils Absolute 0.1 0.0 - 0.1 K/uL   Immature Granulocytes 0 %   Abs Immature Granulocytes 0.02 0.00 - 0.07 K/uL    Comment: Performed at [facility], [address] [place], [place] [address]  Glucose, capillary     Status: Abnormal   Collection Time: 07/20/24  7:48 AM  Result Value Ref Range   Glucose-Capillary 136 (H) 70 - 99 mg/dL    Comment: Glucose reference range applies only to samples taken after fasting for at least 8 hours.        Blood Alcohol level:  Lab Results  Component Value Date   [facility] <15 06/18/2024    Metabolic Disorder Labs: Lab Results  Component Value Date   HGBA1C 5.9 (H) 06/28/2024   MPG 122.63 06/28/2024   No results found for: PROLACTIN Lab Results  Component Value Date   CHOL 156 06/28/2024   TRIG 89  06/28/2024   HDL 70 06/28/2024   CHOLHDL 2.2 06/28/2024   VLDL 18 06/28/2024   LDLCALC 69 06/28/2024    Physical Findings: AIMS:  , ,  ,  ,    CIWA:    COWS:      Psychiatric Specialty Exam:  Presentation  General Appearance:  Casual  Eye Contact: Fleeting  Speech: Normal Rate  Speech Volume: Increased    Mood and Affect  Mood: Euphoric  Affect: stable  Thought Process  Thought Processes: Improving illogical  Orientation:Full (Time, Place and Person)  Thought Content:Illogical; Delusions; Tangential  Hallucinations: Denies  Ideas of Reference:Delusions  Suicidal Thoughts: Denies  Homicidal Thoughts: Denies   Sensorium  Memory: Immediate Fair; Remote  Fair  Judgment: Impaired  Insight: Shallow   Executive Functions  Concentration: Poor  Attention Span: Poor  Recall: [employer] of Knowledge: Fair  Language: Fair   Psychomotor Activity  Psychomotor Activity: No data recorded  Musculoskeletal: Strength & Muscle Tone: within normal limits Gait & Station: normal Assets  Assets: Manufacturing Systems Engineer; Desire for Improvement    Physical Exam: Physical Exam Vitals and nursing note reviewed.    ROS Blood pressure (!) 154/86, pulse 93, temperature (!) 97.2 F (36.2 C), resp. rate 16, height 5' 5 (1.651 m), weight 81 kg, SpO2 100%. Body mass index is 29.7 kg/m.  Diagnosis: Principal Problem:   Bilateral lower extremity edema Active Problems:   Hypercholesteremia   Schizoaffective disorder, bipolar type (HCC)   Chest pain   Sinus tachycardia   Hypotension   Controlled type 2 diabetes mellitus without complication, without long-term current use of insulin (HCC)   Hyponatremia  Schizoaffective disorder bipolar type  Clinical Decision Making: Patient is currently admitted for worsening psychosis, manic symptoms, tangential grandiosity.  She needs inpatient hospitalization for medication management and close monitoring.  Patient is unable to make decisions on her own as she is unable to appreciate and understand her mental health history, the current need for treatment.  [facility] has screened her case and for further evaluation for legal guardianship. [facility] screened in her case. Sister and mom are involved in her care.  Treatment Plan Summary:  Safety and Monitoring:             -- inoluntary admission to inpatient psychiatric unit for safety, stabilization and treatment             -- Daily contact with patient to assess and evaluate symptoms and progress in treatment             -- Patient's case to be discussed in multi-disciplinary team meeting             -- Observation Level: q15 minute checks             --  Vital signs:  q12 hours             -- Precautions: suicide, elopement, and assault   2. Psychiatric Diagnoses and Treatment:  Discontinue Depakote  as patient is noncompliant ContGeodon 20 mg nightly as patient is agreeable to take Geodon  Clozaril  100 mg twice daily enforced with zyprexa  10mg  BID- Cymbalta  40 mg daily   -- The risks/benefits/side-effects/alternatives to this medication were discussed in detail with the patient and time was given for questions. The patient consents to medication trial.                -- Metabolic profile and EKG monitoring obtained while on an atypical antipsychotic (BMI: Lipid Panel: HbgA1c: QTc:)              --  Encouraged patient to participate in unit milieu and in scheduled group therapies                            3. Medical Issues Being Addressed:   Chest pain Sinus tachycardia HTN (hypertension) Bilateral lower extremity edema Hypercholesteremia  12/30:  Troponin negative x 2. Echocardiogram done with pending results.  Small improvement in lower extremity edema so continuing p.o. Lasix .  Will need BMP every other day.  4. Discharge Planning:   -- Social work and case management to assist with discharge planning and identification of hospital follow-up needs prior to discharge  -- Estimated LOS: 3-4 days  Freeman Borba, MD 07/21/2024, 1:42 PM

## 2024-07-21 NOTE — Progress Notes (Signed)
 " PROGRESS NOTE    Valerie [name]  FMW:[id number] DOB: 06-24-1961 DOA: 06/19/2024 PCP: System, Provider Not In  Subjective: No new subjective & objective note has been filed under this hospital service since the last note was generated. No acute events overnight. Seen and examined at bedside. R   Hospital Course: Valerie [name] is a 64 y.o. female with past medical history of schizophrenia and bipolar disorder who was admitted at geropsych facility due to manic episode.  She was complaining of some intermittent chest pain, mostly involving upper retrosternal area, intermittent, no relationship with activity and no radiation.  No associated factors.  Sometimes pain increased with moving around her arms, worsening bilateral lower extremity edema, denies any shortness of breath orthopnea or PND.   Psych was also concerned about her fluctuating blood pressure going from borderline soft to little high. Psych was making some changes to her home medications.   12/30: Vitals and labs stable.  Troponin negative x 2. Echocardiogram done with pending results.  Small improvement in lower extremity edema so continuing p.o. Lasix .  Will need BMP every other day. 12/31.  Held Lasix  and lisinopril  secondary to low blood pressure.  Switched propranolol  over to Toprol -XL twice daily. 07/13/2024.  Blood pressure improved.  Will increase Lasix  to 40 mg orally twice daily for now.  Continue Toprol  XL. 1/2.  Continue Lasix  40 mg twice a day. 1/3.  Change diabetic medications over to Farxiga .  proBNP negative. 1/4.  Patient upset about some of the residents. 1/5.  Continue Lasix  twice a day. 1/6.  Patient wants to continue Lasix  twice a day.  I offered to decrease it down to once a day dosing.   Assessment and Plan:  Bilateral lower extremity edema Sonogram left lower extremity negative earlier in the hospital course.  proBNP negative. Continue Lasix  40 mg p.o. twice daily at patient request.  Continue  Toprol -XL.  Patient does not want to wear compression stockings on the psychiatric floor.  Repeat BMP on 1/9   Sinus tachycardia Heart rate improved with changing propranolol  over to Toprol -XL twice daily.   Hypotension Resolved.   Schizophrenia (HCC) - Being managed by primary team.   Chest pain Cardiac enzymes negative.  Echocardiogram showed EF of 65%.  Likely GERD.  Patient on famotidine    Hypercholesteremia Continue home Crestor    Hyponatremia Sodium a few points less than the normal range.   Controlled type 2 diabetes mellitus without complication, without long-term current use of insulin (HCC) Change diabetes medication over to Farxiga  and get rid of metformin  and Tradjenta .  Last hemoglobin A1c 5.9   Schizoaffective disorder, bipolar type (HCC) Patient on Geodon , trazodone , clozapine , gabapentin .     Code Status: Full Code  Disposition Plan: as per primary team   Objective: Vitals:   07/20/24 0713 07/20/24 1346 07/20/24 1907 07/21/24 0718  BP: (!) 123/59 113/67 105/86 (!) 154/86  Pulse: 80 87 64 93  Resp: 16 16 18 16   Temp: 97.6 F (36.4 C) 97.6 F (36.4 C) (!) 97.3 F (36.3 C) (!) 97.2 F (36.2 C)  TempSrc:      SpO2: 100% 100% 100% 100%  Weight:      Height:       No intake or output data in the 24 hours ending 07/21/24 1241 Filed Weights   06/19/24 0725  Weight: 81 kg    Examination:  Physical Exam Vitals and nursing note reviewed.  Constitutional:      General: She is not in acute distress.  Appearance: She is ill-appearing.  HENT:     Head: Normocephalic and atraumatic.  Cardiovascular:     Rate and Rhythm: Normal rate and regular rhythm.     Pulses: Normal pulses.  Pulmonary:     Effort: Pulmonary effort is normal.     Breath sounds: Normal breath sounds.  Abdominal:     General: Bowel sounds are normal.     Palpations: Abdomen is soft.  Neurological:     Mental Status: She is alert.     Data Reviewed: I have personally  reviewed following labs and imaging studies  CBC: Recent Labs  Lab 07/20/24 0634  WBC 7.2  NEUTROABS 3.2  HGB 11.2*  HCT 36.4  MCV 88.6  PLT 375   Basic Metabolic Panel: Recent Labs  Lab 07/15/24 0657 07/17/24 0930 07/19/24 0611  NA 139 139 138  K 4.0 4.1 4.5  CL 100 99 101  CO2 28 32 26  GLUCOSE 129* 116* 128*  BUN 15 14 18   CREATININE 0.66 0.72 0.68  CALCIUM  9.2 9.6 9.3  MG 2.2  --   --    GFR: Estimated Creatinine Clearance: 77.6 mL/min (by C-G formula based on SCr of 0.68 mg/dL). Liver Function Tests: No results for input(s): AST, ALT, ALKPHOS, BILITOT, PROT, ALBUMIN in the last 168 hours. No results for input(s): LIPASE, AMYLASE in the last 168 hours. No results for input(s): AMMONIA in the last 168 hours. Coagulation Profile: No results for input(s): INR, PROTIME in the last 168 hours. Cardiac Enzymes: No results for input(s): CKTOTAL, CKMB, CKMBINDEX, TROPONINI in the last 168 hours. ProBNP, BNP (last 5 results) Recent Labs    07/15/24 0657  PROBNP 178.0   HbA1C: No results for input(s): HGBA1C in the last 72 hours. CBG: Recent Labs  Lab 07/17/24 0620 07/18/24 0748 07/19/24 0556 07/20/24 0748  GLUCAP 112* 110* 123* 136*   Lipid Profile: No results for input(s): CHOL, HDL, LDLCALC, TRIG, CHOLHDL, LDLDIRECT in the last 72 hours. Thyroid Function Tests: No results for input(s): TSH, T4TOTAL, FREET4, T3FREE, THYROIDAB in the last 72 hours. Anemia Panel: No results for input(s): VITAMINB12, FOLATE, FERRITIN, TIBC, IRON, RETICCTPCT in the last 72 hours. Sepsis Labs: No results for input(s): PROCALCITON, LATICACIDVEN in the last 168 hours.  No results found for this or any previous visit (from the past 240 hours).   Radiology Studies: No results found.  Scheduled Meds:  aspirin  EC  81 mg Oral Daily   cholecalciferol   1,000 Units Oral Weekly   cloZAPine   100 mg Oral BID   Or    OLANZapine   10 mg Intramuscular BID   dapagliflozin  propanediol  10 mg Oral Daily   DULoxetine   40 mg Oral Daily   famotidine   40 mg Oral Daily   furosemide   40 mg Oral 2 times per day   gabapentin   300 mg Oral TID   metoprolol  succinate  25 mg Oral BID   multivitamin with minerals  1 tablet Oral Daily   naproxen   375 mg Oral TID WC   potassium chloride  SA  20 mEq Oral BID   rosuvastatin   5 mg Oral Daily   ziprasidone   20 mg Oral QHS   zolpidem   10 mg Oral QHS   Continuous Infusions:   LOS: 32 days   Norval Bar, MD  [facility]  07/21/2024, 12:41 PM   "

## 2024-07-21 NOTE — Group Note (Signed)
 Date:  07/21/2024 Time:  8:42 PM  Group Topic/Focus:  Identifying Needs:   The focus of this group is to help patients identify their personal needs that have been historically problematic and identify healthy behaviors to address their needs.    Participation Level:  Active  Participation Quality:  Appropriate  Affect:  Appropriate  Cognitive:  Appropriate  Insight: Good  Engagement in Group:  Engaged  Modes of Intervention:  Discussion  Additional Comments:    Romero Earnie Hope 07/21/2024, 8:42 PM

## 2024-07-21 NOTE — Progress Notes (Signed)
 Patient accepted medications without issue. Denies SI/HI/AVH. Observed several outburst requiring redirection. Patient present in the beginning but left group before they were over. Ongoing monitoring continues.   07/21/24 1100  Psych Admission Type (Psych Patients Only)  Admission Status Involuntary  Psychosocial Assessment  Patient Complaints Irritability  Eye Contact Fair  Facial Expression Animated  Affect Labile  Speech Argumentative  Interaction Demanding;Dominating  Motor Activity Slow  Appearance/Hygiene Unremarkable  Behavior Characteristics Irritable  Mood Suspicious  Thought Process  Coherency Disorganized  Content Preoccupation  Delusions Grandeur  Perception WDL  Hallucination None reported or observed  Judgment Impaired  Confusion Mild  Danger to Self  Current suicidal ideation? Denies  Danger to Others  Danger to Others None reported or observed

## 2024-07-22 LAB — BASIC METABOLIC PANEL WITH GFR
Anion gap: 10 (ref 5–15)
BUN: 16 mg/dL (ref 8–23)
CO2: 28 mmol/L (ref 22–32)
Calcium: 9.4 mg/dL (ref 8.9–10.3)
Chloride: 103 mmol/L (ref 98–111)
Creatinine, Ser: 0.68 mg/dL (ref 0.44–1.00)
GFR, Estimated: >60 mL/min
Glucose, Bld: 120 mg/dL — ABNORMAL HIGH (ref 70–99)
Potassium: 4.1 mmol/L (ref 3.5–5.1)
Sodium: 141 mmol/L (ref 135–145)

## 2024-07-22 NOTE — Group Note (Signed)
 Date:  07/22/2024 Time:  4:04 PM  Group Topic/Focus:  Managing Feelings:   The focus of this group is to identify what feelings patients have difficulty handling and develop a plan to handle them in a healthier way upon discharge.    Participation Level:  Did Not Attend   Valerie [name] 07/22/2024, 4:04 PM

## 2024-07-22 NOTE — Progress Notes (Addendum)
 Estimated Sleeping Duration (Last 24 Hours): 5.75-6.75 hours    07/21/24 2033  Psych Admission Type (Psych Patients Only)  Admission Status Involuntary  Psychosocial Assessment  Patient Complaints Irritability  Eye Contact Fair  Facial Expression Animated  Affect Labile  Speech Argumentative  Interaction Demanding;Dominating  Motor Activity Slow  Appearance/Hygiene Unremarkable  Behavior Characteristics Impulsive;Irritable  Mood Suspicious  Thought Process  Coherency Disorganized  Content Preoccupation  Delusions Grandeur  Perception WDL  Hallucination None reported or observed  Judgment Impaired  Confusion Mild  Danger to Self  Current suicidal ideation? Denies  Agreement Not to Harm Self Yes  Description of Agreement verbal  Danger to Others  Danger to Others None reported or observed

## 2024-07-22 NOTE — Progress Notes (Signed)
 [facility] MD Progress Note   1:15 PM Valerie [name]  MRN:  [id number]   Valerie [name] is a 64 year old female presenting voluntary to [facility] requesting a psychiatric evaluation for worsening psychosis. Patient has history of schizophrenia. Patient denied SI, HI, psychosis and alcohol/drug usage. Patient resides at [facility]. Patient reports people are coming by my door talking loud, yelling and screaming and coming in my room with keys. Patient reports having multiple medical Afibs and blood clots due to this. Patient reports people are doing things in the dining room. When asked if she called EMS, patient stated I called EMS, I stripped searched over the phone and called the authorities. Patient is admitted to [facility] unit with Q15 min safety monitoring. Multidisciplinary team approach is offered. Medication management; group/milieu therapy is offered.   Sister Gail Vendetti : [phone]  Subjective:  Chart reviewed, case discussed in multidisciplinary meeting, patient seen during rounds.   Patient is noted to be sitting in the day area.  She remains disinhibited and again tries to talk this provider.  Provider redirected her that she is not appropriate to be hurting other patients or staff members.  She calls the provider as her sister.  She reports taking her medications and denies having any side effects.  She denies SI/HI/plan and denies hallucinations.  She continues to remain discharge focused but is unable to discuss the details of her discharge planning. Past Psychiatric History: see h&P Family History:  Family History  Problem Relation Age of Onset   Colon cancer Neg Hx    Celiac disease Neg Hx    Inflammatory bowel disease Neg Hx    Social History:  Social History   Substance and Sexual Activity  Alcohol Use Never     Social History   Substance and Sexual Activity  Drug Use Never    Social History   Socioeconomic History   Marital status:  Single    Spouse name: Not on file   Number of children: Not on file   Years of education: Not on file   Highest education level: Not on file  Occupational History   Not on file  Tobacco Use   Smoking status: Never   Smokeless tobacco: Never  Substance and Sexual Activity   Alcohol use: Never   Drug use: Never   Sexual activity: Not Currently    Birth control/protection: Pill  Other Topics Concern   Not on file  Social History Narrative   Not on file   Social Drivers of Health   Tobacco Use: Low Risk (06/19/2024)   Patient History    Smoking Tobacco Use: Never    Smokeless Tobacco Use: Never    Passive Exposure: Not on file  Financial Resource Strain: Not on file  Food Insecurity: Unknown (06/19/2024)   Epic    Worried About Programme Researcher, Broadcasting/film/video in the Last Year: Never true    [employer] of Food in the Last Year: Patient declined  Transportation Needs: No Transportation Needs (06/19/2024)   Epic    Lack of Transportation (Medical): No    Lack of Transportation (Non-Medical): No  Physical Activity: Not on file  Stress: Not on file  Social Connections: Not on file  Depression (PHQ2-9): Low Risk (12/21/2022)   Depression (PHQ2-9)    PHQ-2 Score: 0  Alcohol Screen: Low Risk (06/19/2024)   Alcohol Screen    Last Alcohol Screening Score (AUDIT): 0  Housing: Low Risk (06/19/2024)   Epic  Unable to Pay for Housing in the Last Year: No    Number of Times Moved in the Last Year: 0    Homeless in the Last Year: No  Utilities: Not At Risk (06/19/2024)   Epic    Threatened with loss of utilities: No  Health Literacy: Not on file   Past Medical History:  Past Medical History:  Diagnosis Date   Chronic lower extremity pain (2ry area of Pain) (Right) 12/21/2022   Chronic pain syndrome 12/20/2022   DDD (degenerative disc disease), lumbosacral 12/21/2022   HTN (hypertension)    Hypercholesteremia    Prediabetes    Schizophrenia (HCC)     Past Surgical History:  Procedure  Laterality Date   COLONOSCOPY  2014   Dr. Tobie: Pancolonic diverticulosis, tortuous colon, next colonoscopy 2024   TONSILLECTOMY      Current Medications: Current Facility-Administered Medications  Medication Dose Route Frequency Provider Last Rate Last Admin   acetaminophen  (TYLENOL ) tablet 650 mg  650 mg Oral Q6H PRN Onuoha, Chinwendu V, NP   650 mg at 07/11/24 1835   aspirin  EC tablet 81 mg  81 mg Oral Daily Bobbitt, Shalon E, NP   81 mg at 07/22/24 0906   cholecalciferol  (VITAMIN D3) 25 MCG (1000 UNIT) tablet 1,000 Units  1,000 Units Oral Weekly Kelvon Giannini, MD   1,000 Units at 07/18/24 1232   cloZAPine  (CLOZARIL ) tablet 100 mg  100 mg Oral BID Tahtiana Rozier, MD   100 mg at 07/22/24 9095   Or   OLANZapine  (ZYPREXA ) injection 10 mg  10 mg Intramuscular BID Julion Gatt, MD   10 mg at 07/05/24 2233   dapagliflozin  propanediol (FARXIGA ) tablet 10 mg  10 mg Oral Daily Josette Ade, MD   10 mg at 07/22/24 9093   diclofenac  Sodium (VOLTAREN ) 1 % topical gel 4 g  4 g Topical QID PRN Madaram, Kondal R, MD       DULoxetine  (CYMBALTA ) DR capsule 40 mg  40 mg Oral Daily Dashan Chizmar, MD   40 mg at 07/22/24 9094   famotidine  (PEPCID ) tablet 40 mg  40 mg Oral Daily Shrivastava, Aryendra, MD   40 mg at 07/22/24 9095   fluticasone  (FLONASE ) 50 MCG/ACT nasal spray 1 spray  1 spray Each Nare Daily PRN Madaram, Kondal R, MD       furosemide  (LASIX ) tablet 40 mg  40 mg Oral 2 times per day Josette Ade, MD   40 mg at 07/22/24 0802   gabapentin  (NEURONTIN ) capsule 300 mg  300 mg Oral TID Bobbitt, Shalon E, NP   300 mg at 07/22/24 9093   hydrOXYzine  (ATARAX ) tablet 25 mg  25 mg Oral TID PRN Bobbitt, Shalon E, NP   25 mg at 07/20/24 1712   magnesium  hydroxide (MILK OF MAGNESIA) suspension 30 mL  30 mL Oral Daily PRN Bobbitt, Shalon E, NP       metoprolol  succinate (TOPROL -XL) 24 hr tablet 25 mg  25 mg Oral BID Josette Ade, MD   25 mg at 07/22/24 0907   multivitamin with minerals  tablet 1 tablet  1 tablet Oral Daily Bobbitt, Shalon E, NP   1 tablet at 07/22/24 0906   naproxen  (NAPROSYN ) tablet 375 mg  375 mg Oral TID WC Shawnika Pepin, MD   375 mg at 07/22/24 1311   OLANZapine  (ZYPREXA ) injection 5 mg  5 mg Intramuscular TID PRN Bobbitt, Shalon E, NP   5 mg at 06/22/24 2259   OLANZapine  zydis (ZYPREXA ) disintegrating tablet  5 mg  5 mg Oral TID PRN Bobbitt, Shalon E, NP   5 mg at 07/11/24 1835   ondansetron  (ZOFRAN ) tablet 4 mg  4 mg Oral Q8H PRN Bobbitt, Shalon E, NP   4 mg at 07/11/24 1835   potassium chloride  SA (KLOR-CON  M) CR tablet 20 mEq  20 mEq Oral BID Bobbitt, Shalon E, NP   20 mEq at 07/22/24 0904   rosuvastatin  (CRESTOR ) tablet 5 mg  5 mg Oral Daily Bobbitt, Shalon E, NP   5 mg at 07/22/24 0906   traZODone  (DESYREL ) tablet 50 mg  50 mg Oral QHS PRN Bobbitt, Shalon E, NP   50 mg at 07/20/24 2127   ziprasidone  (GEODON ) capsule 20 mg  20 mg Oral QHS Zeev Deakins, MD   20 mg at 07/21/24 2127   zolpidem  (AMBIEN ) tablet 10 mg  10 mg Oral QHS Keren Alverio, MD   10 mg at 07/21/24 2126    Lab Results:  Results for orders placed or performed during the hospital encounter of 06/19/24 (from the past 48 hours)  Basic metabolic panel     Status: Abnormal   Collection Time: 07/22/24  9:42 AM  Result Value Ref Range   Sodium 141 135 - 145 mmol/L   Potassium 4.1 3.5 - 5.1 mmol/L   Chloride 103 98 - 111 mmol/L   CO2 28 22 - 32 mmol/L   Glucose, Bld 120 (H) 70 - 99 mg/dL    Comment: Glucose reference range applies only to samples taken after fasting for at least 8 hours.   BUN 16 8 - 23 mg/dL   Creatinine, Ser 9.31 0.44 - 1.00 mg/dL   Calcium  9.4 8.9 - 10.3 mg/dL   GFR, Estimated >39 >39 mL/min    Comment: (NOTE) Calculated using the CKD-EPI Creatinine Equation (2021)    Anion gap 10 5 - 15    Comment: Performed at [facility], [address] [place], [place] [address]        Blood Alcohol level:  Lab Results  Component Value Date   [facility] <15  06/18/2024    Metabolic Disorder Labs: Lab Results  Component Value Date   HGBA1C 5.9 (H) 06/28/2024   MPG 122.63 06/28/2024   No results found for: PROLACTIN Lab Results  Component Value Date   CHOL 156 06/28/2024   TRIG 89 06/28/2024   HDL 70 06/28/2024   CHOLHDL 2.2 06/28/2024   VLDL 18 06/28/2024   LDLCALC 69 06/28/2024    Physical Findings: AIMS:  , ,  ,  ,    CIWA:    COWS:      Psychiatric Specialty Exam:  Presentation  General Appearance:  Casual  Eye Contact: Fleeting  Speech: Normal Rate  Speech Volume: Increased    Mood and Affect  Mood: Euphoric  Affect: stable  Thought Process  Thought Processes: Improving illogical  Orientation:Full (Time, Place and Person)  Thought Content:Illogical; Delusions; Tangential  Hallucinations: Denies  Ideas of Reference:Delusions  Suicidal Thoughts: Denies  Homicidal Thoughts: Denies   Sensorium  Memory: Immediate Fair; Remote Fair  Judgment: Impaired  Insight: Shallow   Executive Functions  Concentration: Poor  Attention Span: Poor  Recall: [employer] of Knowledge: Fair  Language: Fair   Psychomotor Activity  Psychomotor Activity: No data recorded  Musculoskeletal: Strength & Muscle Tone: within normal limits Gait & Station: normal Assets  Assets: Manufacturing Systems Engineer; Desire for Improvement    Physical Exam: Physical Exam Vitals and nursing note reviewed.  ROS Blood pressure (!) 111/58, pulse 93, temperature (!) 97.3 F (36.3 C), resp. rate 18, height 5' 5 (1.651 m), weight 81 kg, SpO2 100%. Body mass index is 29.7 kg/m.  Diagnosis: Principal Problem:   Bilateral lower extremity edema Active Problems:   Hypercholesteremia   Schizoaffective disorder, bipolar type (HCC)   Chest pain   Sinus tachycardia   Hypotension   Controlled type 2 diabetes mellitus without complication, without long-term current use of insulin (HCC)    Hyponatremia  Schizoaffective disorder bipolar type  Clinical Decision Making: Patient is currently admitted for worsening psychosis, manic symptoms, tangential grandiosity.  She needs inpatient hospitalization for medication management and close monitoring.  Patient is unable to make decisions on her own as she is unable to appreciate and understand her mental health history, the current need for treatment.  [facility] has screened her case and for further evaluation for legal guardianship. [facility] screened in her case. Sister and mom are involved in her care.  Treatment Plan Summary:  Safety and Monitoring:             -- inoluntary admission to inpatient psychiatric unit for safety, stabilization and treatment             -- Daily contact with patient to assess and evaluate symptoms and progress in treatment             -- Patient's case to be discussed in multi-disciplinary team meeting             -- Observation Level: q15 minute checks             -- Vital signs:  q12 hours             -- Precautions: suicide, elopement, and assault   2. Psychiatric Diagnoses and Treatment:  Discontinue Depakote  as patient is noncompliant ContGeodon 20 mg nightly as patient is agreeable to take Geodon  Clozaril  100 mg twice daily enforced with zyprexa  10mg  BID- Cymbalta  40 mg daily   -- The risks/benefits/side-effects/alternatives to this medication were discussed in detail with the patient and time was given for questions. The patient consents to medication trial.                -- Metabolic profile and EKG monitoring obtained while on an atypical antipsychotic (BMI: Lipid Panel: HbgA1c: QTc:)              -- Encouraged patient to participate in unit milieu and in scheduled group therapies                            3. Medical Issues Being Addressed:   Chest pain Sinus tachycardia HTN (hypertension) Bilateral lower extremity edema Hypercholesteremia  12/30:  Troponin negative x 2. Echocardiogram done  with pending results.  Small improvement in lower extremity edema so continuing p.o. Lasix .  Will need BMP every other day.  4. Discharge Planning:   -- Social work and case management to assist with discharge planning and identification of hospital follow-up needs prior to discharge  -- Estimated LOS: 3-4 days  Allyn Foil, MD 07/22/2024, 1:15 PM

## 2024-07-22 NOTE — Group Note (Signed)
 Date:  07/22/2024 Time:  3:02 PM  Group Topic/Focus:  Movement Therapy    Participation Level:  Did Not Attend    Valerie [name] Bias 07/22/2024, 3:02 PM

## 2024-07-22 NOTE — Group Note (Signed)
 Date:  07/22/2024 Time:  10:11 PM  Group Topic/Focus:  Healthy Communication:   The focus of this group is to discuss communication, barriers to communication, as well as healthy ways to communicate with others.    Participation Level:  Active  Participation Quality:  Appropriate  Affect:  Appropriate  Cognitive:  Appropriate  Insight: Good  Engagement in Group:  Engaged  Modes of Intervention:  Discussion  Additional Comments:    Valerie [name] 07/22/2024, 10:11 PM

## 2024-07-22 NOTE — Plan of Care (Signed)
  Problem: Education: Goal: Knowledge of [facility] Education information/materials will improve Outcome: Not Progressing Goal: Emotional status will improve Outcome: Not Progressing Goal: Mental status will improve Outcome: Not Progressing   Problem: Activity: Goal: Interest or engagement in activities will improve Outcome: Not Progressing   Problem: Coping: Goal: Ability to verbalize frustrations and anger appropriately will improve Outcome: Not Progressing Goal: Ability to demonstrate self-control will improve Outcome: Not Progressing

## 2024-07-22 NOTE — Plan of Care (Signed)
  Problem: Activity: Goal: Sleeping patterns will improve Outcome: Progressing   

## 2024-07-22 NOTE — Group Note (Signed)
 Date:  07/22/2024 Time:  10:56 AM  Group Topic/Focus:  Coping With Mental Health Crisis:   The purpose of this group is to help patients identify strategies for coping with mental health crisis.  Group discusses possible causes of crisis and ways to manage them effectively.    Participation Level:  Active  Participation Quality:  Appropriate  Affect:  Appropriate  Cognitive:  Appropriate  Insight: Appropriate  Engagement in Group:  Engaged  Modes of Intervention:  Discussion   Arland Nutting 07/22/2024, 10:56 AM

## 2024-07-22 NOTE — Progress Notes (Signed)
 " PROGRESS NOTE    Valerie [name]  FMW:[id number] DOB: [birth year] DOA: 06/19/2024 PCP: System, Provider Not In  Subjective: No acute events overnight. Seen and examined at bedside. No new complaints. Denies nausea, vomiting, constipation.    Hospital Course: Valerie [name] is a 64 y.o. female with past medical history of schizophrenia and bipolar disorder who was admitted at ger[facility]ych facility due to manic episode.  She was complaining of some intermittent chest pain, mostly involving upper retrosternal area, intermittent, no relationship with activity and no radiation.  No associated factors.  Sometimes pain increased with moving around her arms, worsening bilateral lower extremity edema, denies any shortness of breath orthopnea or PND.   Psych was also concerned about her fluctuating blood pressure going from borderline soft to little high. Psych was making some changes to her home medications.   12/30: Vitals and labs stable.  Troponin negative x 2. Echocardiogram done with pending results.  Small improvement in lower extremity edema so continuing p.o. Lasix .  Will need BMP every other day. 12/31.  Held Lasix  and lisinopril  secondary to low blood pressure.  Switched propranolol  over to Toprol -XL twice daily. 07/13/2024.  Blood pressure improved.  Will increase Lasix  to 40 mg orally twice daily for now.  Continue Toprol  XL. 1/2.  Continue Lasix  40 mg twice a day. 1/3.  Change diabetic medications over to Farxiga .  proBNP negative. 1/4.  Patient upset about some of the residents. 1/5.  Continue Lasix  twice a day. 1/6.  Patient wants to continue Lasix  twice a day.  I offered to decrease it down to once a day dosing.   Assessment and Plan:  Bilateral lower extremity edema Sonogram left lower extremity negative earlier in the hospital course.  proBNP negative. Continue Lasix  40 mg p.o. twice daily at patient request.  Continue Toprol -XL.  Patient does not want to wear compression stockings  on the psychiatric floor.  Repeat BMP on 1/9   Sinus tachycardia Heart rate improved with changing propranolol  over to Toprol -XL twice daily.   Hypotension Resolved.   Schizophrenia (HCC) - Being managed by primary team.   Chest pain Cardiac enzymes negative.  Echocardiogram showed EF of 65%.  Likely GERD.  Patient on famotidine    Hypercholesteremia Continue home Crestor    Hyponatremia Sodium a few points less than the normal range.   Controlled type 2 diabetes mellitus without complication, without long-term current use of insulin (HCC) Change diabetes medication over to Farxiga  and get rid of metformin  and Tradjenta .  Last hemoglobin A1c 5.9   Schizoaffective disorder, bipolar type (HCC) Patient on Geodon , trazodone , clozapine , gabapentin .     Code Status: Full Code  Disposition Plan: as per primary team   Objective: Vitals:   07/21/24 0718 07/21/24 1926 07/21/24 2126 07/22/24 0741  BP: (!) 154/86 114/68 114/68 (!) 111/58  Pulse: 93 86 86 93  Resp: 16 18    Temp: (!) 97.2 F (36.2 C) (!) 96.9 F (36.1 C)  (!) 97.3 F (36.3 C)  TempSrc:      SpO2: 100% 100%  100%  Weight:      Height:       No intake or output data in the 24 hours ending 07/22/24 1400 Filed Weights   06/19/24 0725  Weight: 81 kg    Examination:  Physical Exam Vitals and nursing note reviewed.  Constitutional:      General: She is not in acute distress.    Appearance: She is not ill-appearing.  HENT:  Head: Normocephalic and atraumatic.  Cardiovascular:     Rate and Rhythm: Normal rate and regular rhythm.     Pulses: Normal pulses.     Heart sounds: Normal heart sounds.  Pulmonary:     Effort: Pulmonary effort is normal.     Breath sounds: Normal breath sounds.  Abdominal:     General: Bowel sounds are normal. There is no distension.     Palpations: Abdomen is soft.     Tenderness: There is no abdominal tenderness.  Musculoskeletal:     Right lower leg: Edema present.      Left lower leg: Edema present.  Neurological:     Mental Status: She is alert.     Data Reviewed: I have personally reviewed following labs and imaging studies  CBC: Recent Labs  Lab 07/20/24 0634  WBC 7.2  NEUTROABS 3.2  HGB 11.2*  HCT 36.4  MCV 88.6  PLT 375   Basic Metabolic Panel: Recent Labs  Lab 07/17/24 0930 07/19/24 0611 07/22/24 0942  NA 139 138 141  K 4.1 4.5 4.1  CL 99 101 103  CO2 32 26 28  GLUCOSE 116* 128* 120*  BUN 14 18 16   CREATININE 0.72 0.68 0.68  CALCIUM  9.6 9.3 9.4   GFR: Estimated Creatinine Clearance: 77.6 mL/min (by C-G formula based on SCr of 0.68 mg/dL). Liver Function Tests: No results for input(s): AST, ALT, ALKPHOS, BILITOT, PROT, ALBUMIN in the last 168 hours. No results for input(s): LIPASE, AMYLASE in the last 168 hours. No results for input(s): AMMONIA in the last 168 hours. Coagulation Profile: No results for input(s): INR, PROTIME in the last 168 hours. Cardiac Enzymes: No results for input(s): CKTOTAL, CKMB, CKMBINDEX, TROPONINI in the last 168 hours. ProBNP, BNP (last 5 results) Recent Labs    07/15/24 0657  PROBNP 178.0   HbA1C: No results for input(s): HGBA1C in the last 72 hours. CBG: Recent Labs  Lab 07/17/24 0620 07/18/24 0748 07/19/24 0556 07/20/24 0748  GLUCAP 112* 110* 123* 136*   Lipid Profile: No results for input(s): CHOL, HDL, LDLCALC, TRIG, CHOLHDL, LDLDIRECT in the last 72 hours. Thyroid Function Tests: No results for input(s): TSH, T4TOTAL, FREET4, T3FREE, THYROIDAB in the last 72 hours. Anemia Panel: No results for input(s): VITAMINB12, FOLATE, FERRITIN, TIBC, IRON, RETICCTPCT in the last 72 hours. Sepsis Labs: No results for input(s): PROCALCITON, LATICACIDVEN in the last 168 hours.  No results found for this or any previous visit (from the past 240 hours).   Radiology Studies: No results found.  Scheduled Meds:   aspirin  EC  81 mg Oral Daily   cholecalciferol   1,000 Units Oral Weekly   cloZAPine   100 mg Oral BID   Or   OLANZapine   10 mg Intramuscular BID   dapagliflozin  propanediol  10 mg Oral Daily   DULoxetine   40 mg Oral Daily   famotidine   40 mg Oral Daily   furosemide   40 mg Oral 2 times per day   gabapentin   300 mg Oral TID   metoprolol  succinate  25 mg Oral BID   multivitamin with minerals  1 tablet Oral Daily   naproxen   375 mg Oral TID WC   potassium chloride  SA  20 mEq Oral BID   rosuvastatin   5 mg Oral Daily   ziprasidone   20 mg Oral QHS   zolpidem   10 mg Oral QHS   Continuous Infusions:   LOS: 33 days   Norval Bar, MD  [facility]  07/22/2024, 2:00 PM   "

## 2024-07-23 DIAGNOSIS — F250 Schizoaffective disorder, bipolar type: Secondary | ICD-10-CM | POA: Diagnosis not present

## 2024-07-23 MED ORDER — OXCARBAZEPINE 300 MG PO TABS
300.0000 mg | ORAL_TABLET | Freq: Two times a day (BID) | ORAL | Status: AC
  Administered 2024-07-23 – 2024-08-18 (×54): 300 mg via ORAL
  Filled 2024-07-23 (×54): qty 1

## 2024-07-23 NOTE — Progress Notes (Signed)
 " PROGRESS NOTE    Valerie [name]  FMW:[id number] DOB: Nov 12, 1960 DOA: 06/19/2024 PCP: System, Provider Not In  Subjective: No acute events overnight. Seen and examined at bedside. No new complaints. Denies nausea, vomiting, constipation.    Hospital Course: Valerie [name] is a 64 y.o. female with past medical history of schizophrenia and bipolar disorder who was admitted at [facility] facility due to manic episode.  She was complaining of some intermittent chest pain, mostly involving upper retrosternal area, intermittent, no relationship with activity and no radiation.  No associated factors.  Sometimes pain increased with moving around her arms, worsening bilateral lower extremity edema, denies any shortness of breath orthopnea or PND.   Psych was also concerned about her fluctuating blood pressure going from borderline soft to little high. Psych was making some changes to her home medications.   12/30: Vitals and labs stable.  Troponin negative x 2. Echocardiogram done with pending results.  Small improvement in lower extremity edema so continuing p.o. Lasix .  Will need BMP every other day. 12/31.  Held Lasix  and lisinopril  secondary to low blood pressure.  Switched propranolol  over to Toprol -XL twice daily. 07/13/2024.  Blood pressure improved.  Will increase Lasix  to 40 mg orally twice daily for now.  Continue Toprol  XL. 1/2.  Continue Lasix  40 mg twice a day. 1/3.  Change diabetic medications over to Farxiga .  proBNP negative. 1/4.  Patient upset about some of the residents. 1/5.  Continue Lasix  twice a day. 1/6.  Patient wants to continue Lasix  twice a day.  I offered to decrease it down to once a day dosing.   Assessment and Plan:  Bilateral lower extremity edema Sonogram left lower extremity negative earlier in the hospital course.  proBNP negative. Continue Lasix  40 mg p.o. twice daily at patient request.  Continue Toprol -XL.  Patient does not want to wear compression stockings  on the psychiatric floor.  Repeat BMP as needed to assess electrolytes and renal function of oral diuretic   Sinus tachycardia Heart rate improved with changing propranolol  over to Toprol -XL twice daily.   Hypotension Resolved.   Schizophrenia (HCC) - Being managed by primary team.   Chest pain Cardiac enzymes negative.  Echocardiogram showed EF of 65%.  Likely GERD.  Patient on famotidine    Hypercholesteremia Continue home Crestor    Hyponatremia Sodium a few points less than the normal range.   Controlled type 2 diabetes mellitus without complication, without long-term current use of insulin (HCC) Change diabetes medication over to Farxiga  and get rid of metformin  and Tradjenta .  Last hemoglobin A1c 5.9   Schizoaffective disorder, bipolar type (HCC) Patient on Geodon , trazodone , clozapine , gabapentin .  We will sign off on this patient at present. Please feel free to call back if you have any further questions or concerns.    Code Status: Full Code  Disposition Plan: as per primary team   Objective: Vitals:   07/22/24 0741 07/22/24 2000 07/22/24 2109 07/23/24 0726  BP: (!) 111/58 122/62 122/62 (!) 147/84  Pulse: 93 93 93 85  Resp:  16    Temp: (!) 97.3 F (36.3 C) 97.6 F (36.4 C)  (!) 97.3 F (36.3 C)  TempSrc:      SpO2: 100% 100%  100%  Weight:      Height:       No intake or output data in the 24 hours ending 07/23/24 1453 Filed Weights   06/19/24 0725  Weight: 81 kg    Examination:  Physical Exam Vitals and  nursing note reviewed.  Constitutional:      General: She is not in acute distress.    Appearance: She is not ill-appearing.  Cardiovascular:     Rate and Rhythm: Normal rate and regular rhythm.     Pulses: Normal pulses.     Heart sounds: Normal heart sounds.  Pulmonary:     Effort: Pulmonary effort is normal.     Breath sounds: Normal breath sounds.  Abdominal:     General: Bowel sounds are normal.     Palpations: Abdomen is soft.   Neurological:     Mental Status: She is alert.     Data Reviewed: I have personally reviewed following labs and imaging studies  CBC: Recent Labs  Lab 07/20/24 0634  WBC 7.2  NEUTROABS 3.2  HGB 11.2*  HCT 36.4  MCV 88.6  PLT 375   Basic Metabolic Panel: Recent Labs  Lab 07/17/24 0930 07/19/24 0611 07/22/24 0942  NA 139 138 141  K 4.1 4.5 4.1  CL 99 101 103  CO2 32 26 28  GLUCOSE 116* 128* 120*  BUN 14 18 16   CREATININE 0.72 0.68 0.68  CALCIUM  9.6 9.3 9.4   GFR: Estimated Creatinine Clearance: 77.6 mL/min (by C-G formula based on SCr of 0.68 mg/dL). Liver Function Tests: No results for input(s): AST, ALT, ALKPHOS, BILITOT, PROT, ALBUMIN in the last 168 hours. No results for input(s): LIPASE, AMYLASE in the last 168 hours. No results for input(s): AMMONIA in the last 168 hours. Coagulation Profile: No results for input(s): INR, PROTIME in the last 168 hours. Cardiac Enzymes: No results for input(s): CKTOTAL, CKMB, CKMBINDEX, TROPONINI in the last 168 hours. ProBNP, BNP (last 5 results) Recent Labs    07/15/24 0657  PROBNP 178.0   HbA1C: No results for input(s): HGBA1C in the last 72 hours. CBG: Recent Labs  Lab 07/17/24 0620 07/18/24 0748 07/19/24 0556 07/20/24 0748  GLUCAP 112* 110* 123* 136*   Lipid Profile: No results for input(s): CHOL, HDL, LDLCALC, TRIG, CHOLHDL, LDLDIRECT in the last 72 hours. Thyroid Function Tests: No results for input(s): TSH, T4TOTAL, FREET4, T3FREE, THYROIDAB in the last 72 hours. Anemia Panel: No results for input(s): VITAMINB12, FOLATE, FERRITIN, TIBC, IRON, RETICCTPCT in the last 72 hours. Sepsis Labs: No results for input(s): PROCALCITON, LATICACIDVEN in the last 168 hours.  No results found for this or any previous visit (from the past 240 hours).   Radiology Studies: No results found.  Scheduled Meds:  aspirin  EC  81 mg Oral Daily    cholecalciferol   1,000 Units Oral Weekly   cloZAPine   100 mg Oral BID   Or   OLANZapine   10 mg Intramuscular BID   dapagliflozin  propanediol  10 mg Oral Daily   DULoxetine   40 mg Oral Daily   famotidine   40 mg Oral Daily   furosemide   40 mg Oral 2 times per day   gabapentin   300 mg Oral TID   metoprolol  succinate  25 mg Oral BID   multivitamin with minerals  1 tablet Oral Daily   naproxen   375 mg Oral TID WC   OXcarbazepine   300 mg Oral BID   potassium chloride  SA  20 mEq Oral BID   rosuvastatin   5 mg Oral Daily   ziprasidone   20 mg Oral QHS   zolpidem   10 mg Oral QHS   Continuous Infusions:   LOS: 34 days   Norval Bar, MD  [facility]  07/23/2024, 2:53 PM   "

## 2024-07-23 NOTE — Group Note (Signed)
 [facility] LCSW Group Therapy Note   Group Date: 07/23/2024 Start Time: 1100 End Time: 1130  Type of Therapy and Topic:  Group Therapy:  Feelings around Relapse and Recovery  Participation Level:  Active   Mood: pleasant, calm  Description of Group:    Patients in this group will discuss emotions they experience before and after a relapse. They will process how experiencing these feelings, or avoidance of experiencing them, relates to having a relapse. Facilitator will guide patients to explore emotions they have related to recovery. Patients will be encouraged to process which emotions are more powerful. They will be guided to discuss the emotional reaction significant others in their lives may have to patients relapse or recovery. Patients will be assisted in exploring ways to respond to the emotions of others without this contributing to a relapse.  Therapeutic Goals: Patient will identify two or more emotions that lead to relapse for them:  Patient will identify two emotions that result when they relapse:  Patient will identify two emotions related to recovery:  Patient will demonstrate ability to communicate their needs through discussion and/or role plays.   Summary of Patient Progress:  Valerie [name] was very active throughout the session.  Valerie [name] demonstrated insight into the subject matter and was open input from peers and feedback from CSW.  Valerie [name] was respectful of peers and participated throughout the entire session.   Therapeutic Modalities:   Cognitive Behavioral Therapy Solution-Focused Therapy Assertiveness Training Relapse Prevention Therapy   Rexene LELON Mae, LCSWA

## 2024-07-23 NOTE — Group Note (Signed)
 Date:  07/23/2024 Time:  11:00 AM  Group Topic/Focus:  Self Care:   The focus of this group is to help patients understand the importance of self-care in order to improve or restore emotional, physical, spiritual, interpersonal, and financial health.    Participation Level:  Active  Participation Quality:  Appropriate  Affect:  Flat  Cognitive:  Alert  Insight: Appropriate  Engagement in Group:  Improving  Modes of Intervention:  Activity  Additional Comments:    Valerie [name] C [name] 07/23/2024, 11:00 AM

## 2024-07-23 NOTE — Progress Notes (Signed)
" °   07/23/24 0800  Psych Admission Type (Psych Patients Only)  Admission Status Involuntary  Psychosocial Assessment  Patient Complaints Irritability  Eye Contact Fair  Facial Expression Animated  Affect Labile  Speech Tangential  Interaction Assertive  Motor Activity Slow  Appearance/Hygiene Unremarkable  Behavior Characteristics Impulsive  Mood Suspicious  Thought Process  Coherency Disorganized  Content Preoccupation  Delusions Grandeur  Perception WDL  Hallucination None reported or observed  Judgment Impaired  Confusion Mild  Danger to Self  Current suicidal ideation? Denies  Description of Agreement verbal  Danger to Others  Danger to Others None reported or observed    "

## 2024-07-23 NOTE — Plan of Care (Signed)
   Problem: Education: Goal: Emotional status will improve Outcome: Progressing Goal: Mental status will improve Outcome: Progressing

## 2024-07-23 NOTE — Progress Notes (Addendum)
" °   07/22/24 2259  Psych Admission Type (Psych Patients Only)  Admission Status Involuntary  Psychosocial Assessment  Patient Complaints Irritability  Eye Contact Fair  Facial Expression Animated  Affect Labile  Speech Argumentative  Interaction Demanding  Motor Activity Slow  Appearance/Hygiene Unremarkable  Behavior Characteristics Irritable;Impulsive  Mood Suspicious  Thought Process  Coherency Disorganized  Content Preoccupation  Delusions Grandeur  Perception WDL  Hallucination None reported or observed  Judgment Impaired  Confusion Mild  Danger to Self  Current suicidal ideation? Denies  Agreement Not to Harm Self Yes  Description of Agreement verbal  Danger to Others  Danger to Others None reported or observed    Patient has been up and visible on the unit. Cooperative with all aspects of care this shift, including scheduled medications. No complaints voiced. Slept throughout the night. Currently awake sitting in the day area, watching TV. Will continue to monitor.   Estimated Sleeping Duration (Last 24 Hours): 7.75-8.50 hours  "

## 2024-07-23 NOTE — Group Note (Signed)
 Date:  07/23/2024 Time:  9:31 PM  Group Topic/Focus:  Wrap-Up Group:   The focus of this group is to help patients review their daily goal of treatment and discuss progress on daily workbooks.    Participation Level:  Active  Participation Quality:  Appropriate  Affect:  Appropriate  Cognitive:  Alert  Insight: Appropriate  Engagement in Group:  Engaged  Modes of Intervention:  Discussion  Additional Comments:    Valerie [name] CHRISTELLA Bunker 07/23/2024, 9:31 PM

## 2024-07-23 NOTE — Plan of Care (Signed)
   Problem: Activity: Goal: Interest or engagement in activities will improve Outcome: Progressing Goal: Sleeping patterns will improve Outcome: Progressing

## 2024-07-23 NOTE — Group Note (Signed)
 Date:  07/23/2024 Time:  3:18 PM  Group Topic/Focus:  House rules and unit expectations    Participation Level:  Did Not Attend    [name] 07/23/2024, 3:18 PM
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
## 2024-07-24 DIAGNOSIS — F250 Schizoaffective disorder, bipolar type: Secondary | ICD-10-CM | POA: Diagnosis not present

## 2024-07-24 LAB — GLUCOSE, CAPILLARY
Glucose-Capillary: 122 mg/dL — ABNORMAL HIGH (ref 70–99)
Glucose-Capillary: 141 mg/dL — ABNORMAL HIGH (ref 70–99)
Glucose-Capillary: 116 mg/dL — ABNORMAL HIGH (ref 70–99)

## 2024-07-24 NOTE — Group Note (Signed)
 Date:  07/24/2024 Time:  11:02 PM  Group Topic/Focus:  Wrap-Up Group:   The focus of this group is to help patients review their daily goal of treatment and discuss progress on daily workbooks.  Boundaries Group  Participation Level:  Active  Participation Quality:  Appropriate  Affect:  Appropriate  Cognitive:  Appropriate  Insight: Appropriate  Engagement in Group:  Engaged  Modes of Intervention:  Support  Additional Comments:    Gwendlyn LITTIE Sharps 07/24/2024, 11:02 PM

## 2024-07-24 NOTE — Group Note (Signed)
 Recreation Therapy Group Note   Group Topic:Stress Management  Group Date: 07/24/2024 Start Time: 1415 End Time: 1440 Facilitators: Celestia Jeoffrey BRAVO, LRT, CTRS Location: Dayroom  Group Description: PMR (Progressive Muscle Relaxation). LRT educates patients on what PMR is and the benefits that come from it. Patients are asked to sit with their feet flat on the floor while sitting up and all the way back in their chair, if possible. LRT and pts follow a prompt through a speaker that requires you to tense and release different muscles in their body and focus on their breathing. During session, lights are off and soft music is being played. Pts are given a stress ball to use if needed.   Goal Area(s) Addressed:  Patients will be able to describe progressive muscle relaxation.  Patient will practice using relaxation technique. Patient will identify a new coping skill.  Patient will follow multistep directions to reduce anxiety and stress.   Affect/Mood: Appropriate   Participation Level: Moderate    Clinical Observations/Individualized Feedback: Valerie [name] was present for more than half of group. Pt left early and did not return.   Plan: Continue to engage patient in RT group sessions 2-3x/week.   Jeoffrey BRAVO Celestia, LRT, CTRS 07/24/2024 4:44 PM

## 2024-07-24 NOTE — Plan of Care (Signed)
  Problem: Activity: Goal: Interest or engagement in activities will improve Outcome: Progressing Goal: Sleeping patterns will improve Outcome: Progressing   Problem: Coping: Goal: Ability to verbalize frustrations and anger appropriately will improve Outcome: Progressing   

## 2024-07-24 NOTE — Progress Notes (Signed)
 Patient is labile taking other Patients food and eating. Denies SI/HI/A/VH and verbally contracts for safety. Visible in Milieu requires redirections. Support provided. Compliant with medications no adverse effects noted.

## 2024-07-24 NOTE — Progress Notes (Signed)
 [facility] MD Progress Note   10:54 PM Valerie [name]  MRN:  [id number]   Valerie [name] is a 64 year old female presenting voluntary to [facility] requesting a psychiatric evaluation for worsening psychosis. Patient has history of schizophrenia. Patient denied SI, HI, psychosis and alcohol/drug usage. Patient resides at [facility]. Patient reports people are coming by my door talking loud, yelling and screaming and coming in my room with keys. Patient reports having multiple medical Afibs and blood clots due to this. Patient reports people are doing things in the dining room. When asked if she called EMS, patient stated I called EMS, I stripped searched over the phone and called the authorities. Patient is admitted to [facility] unit with Q15 min safety monitoring. Multidisciplinary team approach is offered. Medication management; group/milieu therapy is offered.   Sister Pete Merten : [phone]  Subjective:  Chart reviewed, case discussed in multidisciplinary meeting, patient seen during rounds.    Patient is noted to be sitting in the day area.  She continues to have high levels of energy.  She is redirectable.  Patient denies SI/HI/plan.  Patient denies auditory/visual hallucinations.  Patient has fair appetite and sleep.  Patient has to be constantly redirected to maintain personal boundaries.  Per nursing patient is taking Trileptal  as a mood stabilizer.  Past Psychiatric History: see h&P Family History:  Family History  Problem Relation Age of Onset   Colon cancer Neg Hx    Celiac disease Neg Hx    Inflammatory bowel disease Neg Hx    Social History:  Social History   Substance and Sexual Activity  Alcohol Use Never     Social History   Substance and Sexual Activity  Drug Use Never    Social History   Socioeconomic History   Marital status: Single    Spouse name: Not on file   Number of children: Not on file   Years of education: Not on file    Highest education level: Not on file  Occupational History   Not on file  Tobacco Use   Smoking status: Never   Smokeless tobacco: Never  Substance and Sexual Activity   Alcohol use: Never   Drug use: Never   Sexual activity: Not Currently    Birth control/protection: Pill  Other Topics Concern   Not on file  Social History Narrative   Not on file   Social Drivers of Health   Tobacco Use: Low Risk (06/19/2024)   Patient History    Smoking Tobacco Use: Never    Smokeless Tobacco Use: Never    Passive Exposure: Not on file  Financial Resource Strain: Not on file  Food Insecurity: Unknown (06/19/2024)   Epic    Worried About Programme Researcher, Broadcasting/film/video in the Last Year: Never true    [employer] of Food in the Last Year: Patient declined  Transportation Needs: No Transportation Needs (06/19/2024)   Epic    Lack of Transportation (Medical): No    Lack of Transportation (Non-Medical): No  Physical Activity: Not on file  Stress: Not on file  Social Connections: Not on file  Depression (PHQ2-9): Low Risk (12/21/2022)   Depression (PHQ2-9)    PHQ-2 Score: 0  Alcohol Screen: Low Risk (06/19/2024)   Alcohol Screen    Last Alcohol Screening Score (AUDIT): 0  Housing: Low Risk (06/19/2024)   Epic    Unable to Pay for Housing in the Last Year: No    Number of Times Moved  in the Last Year: 0    Homeless in the Last Year: No  Utilities: Not At Risk (06/19/2024)   Epic    Threatened with loss of utilities: No  Health Literacy: Not on file   Past Medical History:  Past Medical History:  Diagnosis Date   Chronic lower extremity pain (2ry area of Pain) (Right) 12/21/2022   Chronic pain syndrome 12/20/2022   DDD (degenerative disc disease), 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 Tobie: Pancolonic diverticulosis, tortuous colon, next colonoscopy  2024   TONSILLECTOMY      Current Medications: Current Facility-Administered Medications  Medication Dose Route Frequency Provider Last Rate Last Admin   acetaminophen  (TYLENOL ) tablet 650 mg  650 mg Oral Q6H PRN Onuoha, Chinwendu V, NP   650 mg at 07/11/24 1835   aspirin  EC tablet 81 mg  81 mg Oral Daily Bobbitt, Shalon E, NP   81 mg at 07/24/24 9094   cholecalciferol  (VITAMIN D3) 25 MCG (1000 UNIT) tablet 1,000 Units  1,000 Units Oral Weekly Yoshi Vicencio, MD   1,000 Units at 07/18/24 1232   cloZAPine  (CLOZARIL ) tablet 100 mg  100 mg Oral BID Jakobee Brackins, MD   100 mg at 07/24/24 2118   Or   OLANZapine  (ZYPREXA ) injection 10 mg  10 mg Intramuscular BID Etosha Wetherell, MD   10 mg at 07/05/24 2233   dapagliflozin  propanediol (FARXIGA ) tablet 10 mg  10 mg Oral Daily Josette Ade, MD   10 mg at 07/24/24 0908   diclofenac  Sodium (VOLTAREN ) 1 % topical gel 4 g  4 g Topical QID PRN Madaram, Kondal R, MD       DULoxetine  (CYMBALTA ) DR capsule 40 mg  40 mg Oral Daily Dshaun Reppucci, MD   40 mg at 07/24/24 1000   famotidine  (PEPCID ) tablet 40 mg  40 mg Oral Daily Shrivastava, Aryendra, MD   40 mg at 07/24/24 9095   fluticasone  (FLONASE ) 50 MCG/ACT nasal spray 1 spray  1 spray Each Nare Daily PRN Madaram, Kondal R, MD       furosemide  (LASIX ) tablet 40 mg  40 mg Oral 2 times per day Josette Ade, MD   40 mg at 07/24/24 1600   gabapentin  (NEURONTIN ) capsule 300 mg  300 mg Oral TID Bobbitt, Shalon E, NP   300 mg at 07/24/24 2117   hydrOXYzine  (ATARAX ) tablet 25 mg  25 mg Oral TID PRN Bobbitt, Shalon E, NP   25 mg at 07/20/24 1712   magnesium  hydroxide (MILK OF MAGNESIA) suspension 30 mL  30 mL Oral Daily PRN Bobbitt, Shalon E, NP       metoprolol  succinate (TOPROL -XL) 24 hr tablet 25 mg  25 mg Oral BID Josette Ade, MD   25 mg at 07/24/24 2118   multivitamin with minerals tablet 1 tablet  1 tablet Oral Daily Bobbitt, Shalon E, NP   1 tablet at 07/24/24 0905   naproxen  (NAPROSYN )  tablet 375 mg  375 mg Oral TID WC Doren Kaspar, MD   375 mg at 07/24/24 1800   OLANZapine  (ZYPREXA ) injection 5 mg  5 mg Intramuscular TID PRN Bobbitt, Shalon E, NP   5 mg at 06/22/24 2259   OLANZapine  zydis (ZYPREXA ) disintegrating tablet 5 mg  5 mg Oral TID PRN Bobbitt, Shalon E, NP   5 mg at  07/11/24 1835   ondansetron  (ZOFRAN ) tablet 4 mg  4 mg Oral Q8H PRN Bobbitt, Shalon E, NP   4 mg at 07/11/24 1835   Oxcarbazepine  (TRILEPTAL ) tablet 300 mg  300 mg Oral BID Manali Mcelmurry, MD   300 mg at 07/24/24 2117   potassium chloride  SA (KLOR-CON  M) CR tablet 20 mEq  20 mEq Oral BID Bobbitt, Shalon E, NP   20 mEq at 07/24/24 2118   rosuvastatin  (CRESTOR ) tablet 5 mg  5 mg Oral Daily Bobbitt, Shalon E, NP   5 mg at 07/24/24 0905   traZODone  (DESYREL ) tablet 50 mg  50 mg Oral QHS PRN Bobbitt, Shalon E, NP   50 mg at 07/20/24 2127   ziprasidone  (GEODON ) capsule 20 mg  20 mg Oral QHS Sohail Capraro, MD   20 mg at 07/24/24 2118   zolpidem  (AMBIEN ) tablet 10 mg  10 mg Oral QHS Britta Louth, MD   10 mg at 07/24/24 2117    Lab Results:  Results for orders placed or performed during the hospital encounter of 06/19/24 (from the past 48 hours)  Glucose, capillary     Status: Abnormal   Collection Time: 07/23/24  7:18 AM  Result Value Ref Range   Glucose-Capillary 141 (H) 70 - 99 mg/dL    Comment: Glucose reference range applies only to samples taken after fasting for at least 8 hours.  Glucose, capillary     Status: Abnormal   Collection Time: 07/24/24  6:04 AM  Result Value Ref Range   Glucose-Capillary 116 (H) 70 - 99 mg/dL    Comment: Glucose reference range applies only to samples taken after fasting for at least 8 hours.        Blood Alcohol level:  Lab Results  Component Value Date   [facility] <15 06/18/2024    Metabolic Disorder Labs: Lab Results  Component Value Date   HGBA1C 5.9 (H) 06/28/2024   MPG 122.63 06/28/2024   No results found for: PROLACTIN Lab Results  Component  Value Date   CHOL 156 06/28/2024   TRIG 89 06/28/2024   HDL 70 06/28/2024   CHOLHDL 2.2 06/28/2024   VLDL 18 06/28/2024   LDLCALC 69 06/28/2024    Physical Findings: AIMS:  , ,  ,  ,    CIWA:    COWS:      Psychiatric Specialty Exam:  Presentation  General Appearance:  Casual  Eye Contact: Fleeting  Speech: Normal Rate  Speech Volume: Increased    Mood and Affect  Mood: Euphoric  Affect: stable  Thought Process  Thought Processes: Improving illogical  Orientation:Full (Time, Place and Person)  Thought Content:Illogical; Delusions; Tangential  Hallucinations: Denies  Ideas of Reference:Delusions  Suicidal Thoughts: Denies  Homicidal Thoughts: Denies   Sensorium  Memory: Immediate Fair; Remote Fair  Judgment: Impaired  Insight: Shallow   Executive Functions  Concentration: Poor  Attention Span: Poor  Recall: [employer] of Knowledge: Fair  Language: Fair   Psychomotor Activity  Psychomotor Activity: No data recorded  Musculoskeletal: Strength & Muscle Tone: within normal limits Gait & Station: normal Assets  Assets: Manufacturing Systems Engineer; Desire for Improvement    Physical Exam: Physical Exam Vitals and nursing note reviewed.    ROS Blood pressure 130/64, pulse 98, temperature 98.5 F (36.9 C), resp. rate 14, height 5' 5 (1.651 m), weight 81 kg, SpO2 100%. Body mass index is 29.7 kg/m.  Diagnosis: Principal Problem:   Bilateral lower extremity edema Active Problems:   Hypercholesteremia  Schizoaffective disorder, bipolar type (HCC)   Chest pain   Sinus tachycardia   Hypotension   Controlled type 2 diabetes mellitus without complication, without long-term current use of insulin (HCC)   Hyponatremia  Schizoaffective disorder bipolar type  Clinical Decision Making: Patient is currently admitted for worsening psychosis, manic symptoms, tangential grandiosity.  She needs inpatient hospitalization for  medication management and close monitoring.  Patient is unable to make decisions on her own as she is unable to appreciate and understand her mental health history, the current need for treatment.  [facility] has screened her case and for further evaluation for legal guardianship. [facility] screened in her case. Sister and mom are involved in her care.  Treatment Plan Summary:  Safety and Monitoring:             -- inoluntary admission to inpatient psychiatric unit for safety, stabilization and treatment             -- Daily contact with patient to assess and evaluate symptoms and progress in treatment             -- Patient's case to be discussed in multi-disciplinary team meeting             -- Observation Level: q15 minute checks             -- Vital signs:  q12 hours             -- Precautions: suicide, elopement, and assault   2. Psychiatric Diagnoses and Treatment:  Discontinue Depakote  as patient is noncompliant ContGeodon 20 mg nightly as patient is agreeable to take Geodon  Clozaril  100 mg twice daily enforced with zyprexa  10mg  BID- Cymbalta  40 mg daily Trileptal  150 mg twice daily is added -- The risks/benefits/side-effects/alternatives to this medication were discussed in detail with the patient and time was given for questions. The patient consents to medication trial.                -- Metabolic profile and EKG monitoring obtained while on an atypical antipsychotic (BMI: Lipid Panel: HbgA1c: QTc:)              -- Encouraged patient to participate in unit milieu and in scheduled group therapies                            3. Medical Issues Being Addressed:   Chest pain Sinus tachycardia HTN (hypertension) Bilateral lower extremity edema Hypercholesteremia  12/30:  Troponin negative x 2. Echocardiogram done with pending results.  Small improvement in lower extremity edema so continuing p.o. Lasix .  Will need BMP every other day.  4. Discharge Planning:   -- Social work and case  management to assist with discharge planning and identification of hospital follow-up needs prior to discharge  -- Estimated LOS: 3-4 days  Emira Eubanks, MD 07/24/2024, 10:54 PM

## 2024-07-24 NOTE — Plan of Care (Signed)
   Problem: Activity: Goal: Interest or engagement in activities will improve Outcome: Progressing Goal: Sleeping patterns will improve Outcome: Progressing

## 2024-07-24 NOTE — Group Note (Signed)
 Date:  07/24/2024 Time:  10:56 AM  Group Topic/Focus:  Self Care:   The focus of this group is to help patients understand the importance of self-care in order to improve or restore emotional, physical, spiritual, interpersonal, and financial health.    Participation Level:  Did Not Attend  Participation Quality:    Affect:    Cognitive:    Insight:   Engagement in Group:    Modes of Intervention:   Additional Comments:    Hosie Sharman 07/24/2024, 10:56 AM

## 2024-07-24 NOTE — Progress Notes (Signed)
" °   07/24/24 0113  Psych Admission Type (Psych Patients Only)  Admission Status Involuntary  Psychosocial Assessment  Patient Complaints Irritability  Eye Contact Fair  Facial Expression Animated  Affect Labile  Speech Tangential  Interaction Assertive  Motor Activity Slow  Appearance/Hygiene Unremarkable  Behavior Characteristics Cooperative  Mood Suspicious  Thought Process  Coherency Disorganized  Content Preoccupation  Delusions Grandeur  Perception WDL  Hallucination None reported or observed  Judgment Impaired  Confusion Mild  Danger to Self  Current suicidal ideation? Denies  Agreement Not to Harm Self Yes  Description of Agreement verbal  Danger to Others  Danger to Others None reported or observed    Estimated Sleeping Duration (Last 24 Hours): 6.50-7.50 hours  "

## 2024-07-25 DIAGNOSIS — F250 Schizoaffective disorder, bipolar type: Secondary | ICD-10-CM | POA: Diagnosis not present

## 2024-07-25 LAB — GLUCOSE, CAPILLARY
Glucose-Capillary: 113 mg/dL — ABNORMAL HIGH (ref 70–99)
Glucose-Capillary: 158 mg/dL — ABNORMAL HIGH (ref 70–99)

## 2024-07-25 NOTE — Progress Notes (Signed)
" °   07/25/24 2300  Psych Admission Type (Psych Patients Only)  Admission Status Involuntary  Psychosocial Assessment  Patient Complaints None  Eye Contact Fair  Facial Expression Animated  Affect Labile  Speech Tangential  Interaction Assertive  Motor Activity Fidgety  Appearance/Hygiene In scrubs  Behavior Characteristics Impulsive  Mood Labile  Thought Process  Coherency Disorganized  Content Preoccupation  Delusions Grandeur  Perception Derealization  Hallucination None reported or observed  Judgment Impaired  Confusion Mild  Danger to Self  Current suicidal ideation? Denies  Agreement Not to Harm Self Yes  Description of Agreement Verbal  Danger to Others  Danger to Others None reported or observed    "

## 2024-07-25 NOTE — Plan of Care (Signed)
   Problem: Education: Goal: Knowledge of [place] [facility] Education information/materials will improve Outcome: Progressing Goal: Emotional status will improve Outcome: Progressing Goal: Mental status will improve Outcome: Progressing Goal: Verbalization of understanding the information provided will improve Outcome: Progressing

## 2024-07-25 NOTE — Progress Notes (Signed)
 Patient pacing up and down the hallway screaming, give me my meds before the white men come and get me, I am ready for my meds right now Valerie [name]. This RN just walked in and took report. Night meds to be given.

## 2024-07-25 NOTE — Group Note (Signed)
 Date:  07/25/2024 Time:  9:53 PM  Group Topic/Focus:  Wrap-Up Group:   The focus of this group is to help patients review their daily goal of treatment and discuss progress on daily workbooks.    Participation Level:  Active  Participation Quality:  Appropriate  Affect:  Appropriate  Cognitive:  Alert  Insight: Appropriate  Engagement in Group:  Engaged  Modes of Intervention:  Discussion  Additional Comments:    Valerie [name] 07/25/2024, 9:53 PM

## 2024-07-25 NOTE — BH IP Treatment Plan (Addendum)
 Interdisciplinary Treatment and Diagnostic Plan Update  07/25/2024 Time of Session: 3:00  Valerie [name] MRN: [id number]  Principal Diagnosis: Bilateral lower extremity edema  Secondary Diagnoses: Principal Problem:   Bilateral lower extremity edema Active Problems:   Hypercholesteremia   Schizoaffective disorder, bipolar type (HCC)   Chest pain   Sinus tachycardia   Hypotension   Controlled type 2 diabetes mellitus without complication, without long-term current use of insulin (HCC)   Hyponatremia   Current Medications:  Current Facility-Administered Medications  Medication Dose Route Frequency Provider Last Rate Last Admin   acetaminophen  (TYLENOL ) tablet 650 mg  650 mg Oral Q6H PRN Onuoha, Chinwendu V, NP   650 mg at 07/11/24 1835   aspirin  EC tablet 81 mg  81 mg Oral Daily Bobbitt, Shalon E, NP   81 mg at 07/25/24 0954   cholecalciferol  (VITAMIN D3) 25 MCG (1000 UNIT) tablet 1,000 Units  1,000 Units Oral Weekly Jadapalle, Sree, MD   1,000 Units at 07/18/24 1232   cloZAPine  (CLOZARIL ) tablet 100 mg  100 mg Oral BID Jadapalle, Sree, MD   100 mg at 07/25/24 9043   Or   OLANZapine  (ZYPREXA ) injection 10 mg  10 mg Intramuscular BID Jadapalle, Sree, MD   10 mg at 07/05/24 2233   dapagliflozin  propanediol (FARXIGA ) tablet 10 mg  10 mg Oral Daily Josette Ade, MD   10 mg at 07/25/24 9043   diclofenac  Sodium (VOLTAREN ) 1 % topical gel 4 g  4 g Topical QID PRN Madaram, Kondal R, MD       DULoxetine  (CYMBALTA ) DR capsule 40 mg  40 mg Oral Daily Jadapalle, Sree, MD   40 mg at 07/25/24 9044   famotidine  (PEPCID ) tablet 40 mg  40 mg Oral Daily Shrivastava, Aryendra, MD   40 mg at 07/25/24 0955   fluticasone  (FLONASE ) 50 MCG/ACT nasal spray 1 spray  1 spray Each Nare Daily PRN Madaram, Kondal R, MD       furosemide  (LASIX ) tablet 40 mg  40 mg Oral 2 times per day Josette Ade, MD   40 mg at 07/25/24 0955   gabapentin  (NEURONTIN ) capsule 300 mg  300 mg Oral TID Bobbitt, Shalon E, NP    300 mg at 07/25/24 0955   hydrOXYzine  (ATARAX ) tablet 25 mg  25 mg Oral TID PRN Bobbitt, Shalon E, NP   25 mg at 07/20/24 1712   magnesium  hydroxide (MILK OF MAGNESIA) suspension 30 mL  30 mL Oral Daily PRN Bobbitt, Shalon E, NP       metoprolol  succinate (TOPROL -XL) 24 hr tablet 25 mg  25 mg Oral BID Josette Ade, MD   25 mg at 07/25/24 0956   multivitamin with minerals tablet 1 tablet  1 tablet Oral Daily Bobbitt, Shalon E, NP   1 tablet at 07/25/24 0954   naproxen  (NAPROSYN ) tablet 375 mg  375 mg Oral TID WC Jadapalle, Sree, MD   375 mg at 07/25/24 0954   OLANZapine  (ZYPREXA ) injection 5 mg  5 mg Intramuscular TID PRN Bobbitt, Shalon E, NP   5 mg at 06/22/24 2259   OLANZapine  zydis (ZYPREXA ) disintegrating tablet 5 mg  5 mg Oral TID PRN Bobbitt, Shalon E, NP   5 mg at 07/11/24 1835   ondansetron  (ZOFRAN ) tablet 4 mg  4 mg Oral Q8H PRN Bobbitt, Shalon E, NP   4 mg at 07/11/24 1835   Oxcarbazepine  (TRILEPTAL ) tablet 300 mg  300 mg Oral BID Jadapalle, Sree, MD   300 mg at 07/25/24 [phone]  potassium chloride  SA (KLOR-CON  M) CR tablet 20 mEq  20 mEq Oral BID Bobbitt, Shalon E, NP   20 mEq at 07/25/24 0955   rosuvastatin  (CRESTOR ) tablet 5 mg  5 mg Oral Daily Bobbitt, Shalon E, NP   5 mg at 07/25/24 9046   traZODone  (DESYREL ) tablet 50 mg  50 mg Oral QHS PRN Bobbitt, Shalon E, NP   50 mg at 07/20/24 2127   ziprasidone  (GEODON ) capsule 20 mg  20 mg Oral QHS Jadapalle, Sree, MD   20 mg at 07/24/24 2118   zolpidem  (AMBIEN ) tablet 10 mg  10 mg Oral QHS Jadapalle, Sree, MD   10 mg at 07/24/24 2117   PTA Medications: Medications Prior to Admission  Medication Sig Dispense Refill Last Dose/Taking   acetaminophen  (TYLENOL ) 650 MG CR tablet Take 650 mg by mouth every 8 (eight) hours as needed for pain.      benztropine (COGENTIN) 0.5 MG tablet Take by mouth.      bisacodyl  5 MG EC tablet As directed for procedure 8 tablet 0    Cholecalciferol  (VITAMIN D ) 125 MCG (5000 UT) CAPS Take 1,000 Units by mouth  once a week.      cloZAPine  (CLOZARIL ) 100 MG tablet Take 200 mg by mouth 2 (two) times daily.      DULoxetine  (CYMBALTA ) 20 MG capsule Take 20 mg by mouth daily.      fluticasone  (FLONASE ) 50 MCG/ACT nasal spray Place 1 spray into both nostrils daily.      gabapentin  (NEURONTIN ) 300 MG capsule Take 300 mg by mouth 3 (three) times daily.      haloperidol  (HALDOL ) 0.5 MG tablet Take 0.5 mg by mouth 2 (two) times daily.      hydrochlorothiazide  (HYDRODIURIL ) 25 MG tablet Take 25 mg by mouth daily.      lisinopril  (ZESTRIL ) 30 MG tablet Take 30 mg by mouth daily.      loperamide (IMODIUM) 2 MG capsule Take 2 mg by mouth as needed for diarrhea or loose stools. Take 1 capsule by mouth as needed with each loose stool/diarrhea nte 8 doses in 24 hr      Multiple Vitamin (DAILY VITE PO) Take 1 tablet by mouth daily.      naproxen  (NAPROSYN ) 500 MG tablet Take 500 mg by mouth every 12 (twelve) hours as needed.      omeprazole (PRILOSEC) 20 MG capsule Take 20 mg by mouth daily.      ondansetron  (ZOFRAN ) 4 MG tablet Take 4 mg by mouth every 8 (eight) hours as needed for nausea or vomiting.      potassium chloride  SA (KLOR-CON  M) 20 MEQ tablet Take 20 mEq by mouth 2 (two) times daily.      propranolol  (INDERAL ) 40 MG tablet Take 40 mg by mouth 2 (two) times daily.      rosuvastatin  (CRESTOR ) 5 MG tablet Take 5 mg by mouth daily.      sodium phosphate  (FLEET) ENEM As directed for procedure 133 mL 0    triamcinolone ointment (KENALOG) 0.1 % Apply topically.       Patient Stressors: Medication change or noncompliance    Patient Strengths: Ability for insight   Treatment Modalities: Medication Management, Group therapy, Case management,  1 to 1 session with clinician, Psychoeducation, Recreational therapy.   Physician Treatment Plan for Primary Diagnosis: Bilateral lower extremity edema Long Term Goal(s): Improvement in symptoms so as ready for discharge   Short Term Goals: Ability to identify changes  in lifestyle  to reduce recurrence of condition will improve Ability to verbalize feelings will improve Ability to disclose and discuss suicidal ideas Ability to demonstrate self-control will improve Ability to identify and develop effective coping behaviors will improve  Medication Management: Evaluate patient's response, side effects, and tolerance of medication regimen.  Therapeutic Interventions: 1 to 1 sessions, Unit Group sessions and Medication administration.  Evaluation of Outcomes: Progressing  Physician Treatment Plan for Secondary Diagnosis: Principal Problem:   Bilateral lower extremity edema Active Problems:   Hypercholesteremia   Schizoaffective disorder, bipolar type (HCC)   Chest pain   Sinus tachycardia   Hypotension   Controlled type 2 diabetes mellitus without complication, without long-term current use of insulin (HCC)   Hyponatremia  Long Term Goal(s): Improvement in symptoms so as ready for discharge   Short Term Goals: Ability to identify changes in lifestyle to reduce recurrence of condition will improve Ability to verbalize feelings will improve Ability to disclose and discuss suicidal ideas Ability to demonstrate self-control will improve Ability to identify and develop effective coping behaviors will improve     Medication Management: Evaluate patient's response, side effects, and tolerance of medication regimen.  Therapeutic Interventions: 1 to 1 sessions, Unit Group sessions and Medication administration.  Evaluation of Outcomes: Progressing   RN Treatment Plan for Primary Diagnosis: Bilateral lower extremity edema Long Term Goal(s): Knowledge of disease and therapeutic regimen to maintain health will improve  Short Term Goals: Ability to remain free from injury will improve, Ability to verbalize frustration and anger appropriately will improve, Ability to demonstrate self-control, Ability to participate in decision making will improve, Ability to  verbalize feelings will improve, Ability to disclose and discuss suicidal ideas, Ability to identify and develop effective coping behaviors will improve, and Compliance with prescribed medications will improve  Medication Management: RN will administer medications as ordered by provider, will assess and evaluate patient's response and provide education to patient for prescribed medication. RN will report any adverse and/or side effects to prescribing provider.  Therapeutic Interventions: 1 on 1 counseling sessions, Psychoeducation, Medication administration, Evaluate responses to treatment, Monitor vital signs and CBGs as ordered, Perform/monitor CIWA, COWS, AIMS and Fall Risk screenings as ordered, Perform wound care treatments as ordered.  Evaluation of Outcomes: Progressing   LCSW Treatment Plan for Primary Diagnosis: Bilateral lower extremity edema Long Term Goal(s): Safe transition to appropriate next level of care at discharge, Engage patient in therapeutic group addressing interpersonal concerns.  Short Term Goals: Engage patient in aftercare planning with referrals and resources, Increase social support, Increase ability to appropriately verbalize feelings, Increase emotional regulation, Facilitate acceptance of mental health diagnosis and concerns, Facilitate patient progression through stages of change regarding substance use diagnoses and concerns, Identify triggers associated with mental health/substance abuse issues, and Increase skills for wellness and recovery  Therapeutic Interventions: Assess for all discharge needs, 1 to 1 time with Social worker, Explore available resources and support systems, Assess for adequacy in community support network, Educate family and significant other(s) on suicide prevention, Complete Psychosocial Assessment, Interpersonal group therapy.  Evaluation of Outcomes: Progressing   Progress in Treatment: Attending groups: Yes. and No. Participating in  groups: Yes. and No. Taking medication as prescribed: Yes. Toleration medication: Yes. Family/Significant other contact made: Yes, individual(s) contacted:  Yes, pt's sister and mother  Patient understands diagnosis: No. Discussing patient identified problems/goals with staff: Yes. Medical problems stabilized or resolved: Yes. Denies suicidal/homicidal ideation: Yes. Issues/concerns per patient self-inventory: Yes. Other: none   New problem(s) identified: No,  Describe:  none   New Short Term/Long Term Goal(s):elimination of symptoms of psychosis, medication management for mood stabilization; elimination of SI thoughts; development of comprehensive mental wellness plan. 06/29/24 Update: No changes at this time. Update 07/04/24: No changes at this time Update 07/10/24: No changes at this time Update 07/10/24: No changes at this time 07/15/24 no changes Update 07/20/24: No changes at this time 07/15/24 no changes Update 07/25/24: No changes at this time   Patient Goals:    To continue to get well 06/29/24 Update: No changes at this time. Update 07/04/24: No changes at this time Update 07/10/24: No changes at this time Update 07/10/24: No changes at this time 07/15/24 no changes Update 07/20/24: No changes at this time 07/15/24 no changes Update 07/25/24: No changes at this time   Discharge Plan or Barriers: CSW will assist with appropriate discharge planning 06/29/24 Update:  CSW has made APS report to [facility] APs following safety concerns. CSW to continue to coordinate with DSS worker to continue to assess. Update 07/04/24: No changes at this time Update 07/10/24: No changes at this time Update 07/10/24: DSS reports no changes at this time, DSS unsure if pt's family has filed for the guardianship. Caseworker Adams Louder reports she will check in with CSW after New Years 07/15/2024 no changes Update 07/20/24: Pt's family filed for guardianship, court date scheduled for 08/25/23 Update 07/25/24: No  changes at this time   Reason for Continuation of Hospitalization: Mania Medication stabilization    Estimated Length of Stay: TBD 07/15/24 Update 07/20/24: TBD Update 07/25/24: TBD  Last 3 [place] Suicide Severity Risk Score: Flowsheet Row Admission (Current) from 06/19/2024 in [facility] from 06/18/2024 in [facility] at [facility] ED from 05/24/2022 in [facility] Emergency Department at [facility]  C-SSRS RISK CATEGORY No Risk No Risk No Risk    Last PHQ 2/9 Scores:    12/21/2022    9:00 AM  Depression screen PHQ 2/9  Decreased Interest 0  Down, Depressed, Hopeless 0  PHQ - 2 Score 0    Scribe for Treatment Team: Valerie [name], ISRA[place] 07/25/2024 11:33 AM

## 2024-07-25 NOTE — Group Note (Signed)
 [facility] LCSW Group Therapy Note   Group Date: 07/25/2024 Start Time: 1330 End Time: 1400  Type of Therapy and Topic:  Group Therapy:  Feelings around Relapse and Recovery  Participation Level:  Active   Mood:  Description of Group:    Patients in this group will discuss emotions they experience before and after a relapse. They will process how experiencing these feelings, or avoidance of experiencing them, relates to having a relapse. Facilitator will guide patients to explore emotions they have related to recovery. Patients will be encouraged to process which emotions are more powerful. They will be guided to discuss the emotional reaction significant others in their lives may have to patients relapse or recovery. Patients will be assisted in exploring ways to respond to the emotions of others without this contributing to a relapse.  Therapeutic Goals: Patient will identify two or more emotions that lead to relapse for them:  Patient will identify two emotions that result when they relapse:  Patient will identify two emotions related to recovery:  Patient will demonstrate ability to communicate their needs through discussion and/or role plays.   Summary of Patient Progress:  Pt was active and appropriate throughout group   Therapeutic Modalities:   Cognitive Behavioral Therapy Solution-Focused Therapy Assertiveness Training Relapse Prevention Therapy   Lum JONETTA Croft, LCSWA

## 2024-07-25 NOTE — Group Note (Signed)
 Date:  07/25/2024 Time:  11:06 AM  Group Topic/Focus:  Orientation:   The focus of this group is to educate the patient on the purpose and policies of crisis stabilization and provide a format to answer questions about their admission.  The group details unit policies and expectations of patients while admitted. Wellness Toolbox:   The focus of this group is to discuss various aspects of wellness, balancing those aspects and exploring ways to increase the ability to experience wellness.  Patients will create a wellness toolbox for use upon discharge.    Participation Level:  Minimal  Participation Quality:  Appropriate and Attentive  Affect:  Appropriate  Cognitive:  Alert  Insight: Appropriate  Engagement in Group:  Engaged  Modes of Intervention:  Activity and Orientation  Additional Comments:     Maglione,Terrilee Dudzik E 07/25/2024, 11:06 AM

## 2024-07-25 NOTE — Group Note (Signed)
 Recreation Therapy Group Note   Group Topic:Leisure Education  Group Date: 07/25/2024 Start Time: 1415 End Time: 1500 Facilitators: Celestia Jeoffrey BRAVO, LRT, CTRS Location: Dayroom  Group Description: General Recreation. Patients were given the opportunity to play cards, journal, or color mandalas during group. Pt identified and conversated about things they enjoy doing in their free time and how they can continue to do that outside of the hospital.  Goal Area(s) Addressed: Patient will practice making a positive decision. Patient will have the opportunity to try a new leisure activity. Patient will communicate with peers and LRT.   Affect/Mood: Full range   Participation Level: Minimal    Clinical Observations/Individualized Feedback: Valerie [name] was I and out of group. Pt would randomly blurt out: I have bipolar! I am the devil!. Pt is noted to be a trigger and distraction to peers.   Plan: Continue to engage patient in RT group sessions 2-3x/week.   Jeoffrey BRAVO Celestia, LRT, CTRS 07/25/2024 5:01 PM

## 2024-07-25 NOTE — Progress Notes (Signed)
 [facility]: Lambert Rhonda W MD Progress Note   3:50 PM Valerie [name]  MRN:  [id number]   Valerie [name] is a 64 year old female presenting voluntary to [facility] requesting a psychiatric evaluation for worsening psychosis. Patient has history of schizophrenia. Patient denied SI, HI, psychosis and alcohol/drug usage. Patient resides at [facility]. Patient reports people are coming by my door talking loud, yelling and screaming and coming in my room with keys. Patient reports having multiple medical Afibs and blood clots due to this. Patient reports people are doing things in the dining room. When asked if she called EMS, patient stated I called EMS, I stripped searched over the phone and called the authorities. Patient is admitted to [facility] unit with Q15 min safety monitoring. Multidisciplinary team approach is offered. Medication management; group/milieu therapy is offered.   [name] : [phone]  Subjective:  Chart reviewed, case discussed in multidisciplinary meeting, patient seen during rounds.   Patient remains very hyperactive, poor personal boundaries, intrusive, sexually inappropriate with female staff members and other patients.  Other patients where complaining that patient is stealing their food, staring at them inappropriately, sitting close to them touching them.  Some female patients report fearing their safety due to patient's intrusive behaviors.  Patient also is noted to be intermittently making screaming sounds for no apparent reason or trigger noted.  With the provider she calls the provider as her family and tries to touch her.  Provider redirected her multiple times.  Patient denies SI/HI/plan and denies hallucinations.  She does request for discharge but lacks insight into her mental health problems and the need for appropriate safe discharge planning.  Treatment team will reach out to patient's sister who applied for legal guardianship.   Past Psychiatric  History: see h&P Family History:  Family History  Problem Relation Age of Onset   Colon cancer Neg Hx    Celiac disease Neg Hx    Inflammatory bowel disease Neg Hx    Social History:  Social History   Substance and Sexual Activity  Alcohol Use Never     Social History   Substance and Sexual Activity  Drug Use Never    Social History   Socioeconomic History   Marital status: Single    Spouse name: Not on file   Number of children: Not on file   Years of education: Not on file   Highest education level: Not on file  Occupational History   Not on file  Tobacco Use   Smoking status: Never   Smokeless tobacco: Never  Substance and Sexual Activity   Alcohol use: Never   Drug use: Never   Sexual activity: Not Currently    Birth control/protection: Pill  Other Topics Concern   Not on file  Social History Narrative   Not on file   Social Drivers of Health   Tobacco Use: Low Risk (06/19/2024)   Patient History    Smoking Tobacco Use: Never    Smokeless Tobacco Use: Never    Passive Exposure: Not on file  Financial Resource Strain: Not on file  Food Insecurity: Unknown (06/19/2024)   Epic    Worried About Programme Researcher, Broadcasting/film/video in the Last Year: Never true    [employer] of Food in the Last Year: Patient declined  Transportation Needs: No Transportation Needs (06/19/2024)   Epic    Lack of Transportation (Medical): No    Lack of Transportation (Non-Medical): No  Physical Activity: Not on file  Stress: Not on file  Social Connections: Not on file  Depression (PHQ2-9): Low Risk (12/21/2022)   Depression (PHQ2-9)    PHQ-2 Score: 0  Alcohol Screen: Low Risk (06/19/2024)   Alcohol Screen    Last Alcohol Screening Score (AUDIT): 0  Housing: Low Risk (06/19/2024)   Epic    Unable to Pay for Housing in the Last Year: No    Number of Times Moved in the Last Year: 0    Homeless in the Last Year: No  Utilities: Not At Risk (06/19/2024)   Epic    Threatened with loss of  utilities: No  Health Literacy: Not on file   Past Medical History:  Past Medical History:  Diagnosis Date   Chronic lower extremity pain (2ry area of Pain) (Right) 12/21/2022   Chronic pain syndrome 12/20/2022   DDD (degenerative disc disease), 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 Tobie: Pancolonic diverticulosis, tortuous colon, next colonoscopy 2024   TONSILLECTOMY      Current Medications: Current Facility-Administered Medications  Medication Dose Route Frequency Provider Last Rate Last Admin   acetaminophen  (TYLENOL ) tablet 650 mg  650 mg Oral Q6H PRN Onuoha, Chinwendu V, NP   650 mg at 07/11/24 1835   aspirin  EC tablet 81 mg  81 mg Oral Daily Bobbitt, Shalon E, NP   81 mg at 07/25/24 0954   cholecalciferol  (VITAMIN D3) 25 MCG (1000 UNIT) tablet 1,000 Units  1,000 Units Oral Weekly Kristofer Schaffert, MD   1,000 Units at 07/25/24 1230   cloZAPine  (CLOZARIL ) tablet 100 mg  100 mg Oral BID Maat Kafer, MD   100 mg at 07/25/24 9043   Or   OLANZapine  (ZYPREXA ) injection 10 mg  10 mg Intramuscular BID Iliyah Bui, MD   10 mg at 07/05/24 2233   dapagliflozin  propanediol (FARXIGA ) tablet 10 mg  10 mg Oral Daily Josette Ade, MD   10 mg at 07/25/24 9043   diclofenac  Sodium (VOLTAREN ) 1 % topical gel 4 g  4 g Topical QID PRN Madaram, Kondal R, MD       DULoxetine  (CYMBALTA ) DR capsule 40 mg  40 mg Oral Daily Imanni Burdine, MD   40 mg at 07/25/24 9044   famotidine  (PEPCID ) tablet 40 mg  40 mg Oral Daily Shrivastava, Aryendra, MD   40 mg at 07/25/24 0955   fluticasone  (FLONASE ) 50 MCG/ACT nasal spray 1 spray  1 spray Each Nare Daily PRN Madaram, Kondal R, MD       furosemide  (LASIX ) tablet 40 mg  40 mg Oral 2 times per day Josette Ade, MD   40 mg at 07/25/24 1438   gabapentin  (NEURONTIN ) capsule 300 mg  300 mg Oral TID  Bobbitt, Shalon E, NP   300 mg at 07/25/24 0955   hydrOXYzine  (ATARAX ) tablet 25 mg  25 mg Oral TID PRN Bobbitt, Shalon E, NP   25 mg at 07/20/24 1712   magnesium  hydroxide (MILK OF MAGNESIA) suspension 30 mL  30 mL Oral Daily PRN Bobbitt, Shalon E, NP       metoprolol  succinate (TOPROL -XL) 24 hr tablet 25 mg  25 mg Oral BID Josette Ade, MD   25 mg at 07/25/24 0956   multivitamin with minerals tablet 1 tablet  1 tablet Oral Daily Bobbitt, Shalon E, NP   1 tablet at  07/25/24 0954   naproxen  (NAPROSYN ) tablet 375 mg  375 mg Oral TID WC Mackinze Criado, MD   375 mg at 07/25/24 1230   OLANZapine  (ZYPREXA ) injection 5 mg  5 mg Intramuscular TID PRN Bobbitt, Shalon E, NP   5 mg at 06/22/24 2259   OLANZapine  zydis (ZYPREXA ) disintegrating tablet 5 mg  5 mg Oral TID PRN Bobbitt, Shalon E, NP   5 mg at 07/11/24 1835   ondansetron  (ZOFRAN ) tablet 4 mg  4 mg Oral Q8H PRN Bobbitt, Shalon E, NP   4 mg at 07/11/24 1835   Oxcarbazepine  (TRILEPTAL ) tablet 300 mg  300 mg Oral BID Gionna Polak, MD   300 mg at 07/25/24 0954   potassium chloride  SA (KLOR-CON  M) CR tablet 20 mEq  20 mEq Oral BID Bobbitt, Shalon E, NP   20 mEq at 07/25/24 0955   rosuvastatin  (CRESTOR ) tablet 5 mg  5 mg Oral Daily Bobbitt, Shalon E, NP   5 mg at 07/25/24 9046   traZODone  (DESYREL ) tablet 50 mg  50 mg Oral QHS PRN Bobbitt, Shalon E, NP   50 mg at 07/20/24 2127   ziprasidone  (GEODON ) capsule 20 mg  20 mg Oral QHS Kathya Wilz, MD   20 mg at 07/24/24 2118   zolpidem  (AMBIEN ) tablet 10 mg  10 mg Oral QHS Markeya Mincy, MD   10 mg at 07/24/24 2117    Lab Results:  Results for orders placed or performed during the hospital encounter of 06/19/24 (from the past 48 hours)  Glucose, capillary     Status: Abnormal   Collection Time: 07/24/24  6:04 AM  Result Value Ref Range   Glucose-Capillary 116 (H) 70 - 99 mg/dL    Comment: Glucose reference range applies only to samples taken after fasting for at least 8 hours.  Glucose,  capillary     Status: Abnormal   Collection Time: 07/25/24  6:36 AM  Result Value Ref Range   Glucose-Capillary 113 (H) 70 - 99 mg/dL    Comment: Glucose reference range applies only to samples taken after fasting for at least 8 hours.  Glucose, capillary     Status: Abnormal   Collection Time: 07/25/24  7:28 AM  Result Value Ref Range   Glucose-Capillary 158 (H) 70 - 99 mg/dL    Comment: Glucose reference range applies only to samples taken after fasting for at least 8 hours.        Blood Alcohol level:  Lab Results  Component Value Date   [facility] <15 06/18/2024    Metabolic Disorder Labs: Lab Results  Component Value Date   HGBA1C 5.9 (H) 06/28/2024   MPG 122.63 06/28/2024   No results found for: PROLACTIN Lab Results  Component Value Date   CHOL 156 06/28/2024   TRIG 89 06/28/2024   HDL 70 06/28/2024   CHOLHDL 2.2 06/28/2024   VLDL 18 06/28/2024   LDLCALC 69 06/28/2024    Physical Findings: AIMS:  , ,  ,  ,    CIWA:    COWS:      Psychiatric Specialty Exam:  Presentation  General Appearance:  Casual  Eye Contact: Fleeting  Speech: Normal Rate  Speech Volume: Increased    Mood and Affect  Mood: Euphoric  Affect: stable  Thought Process  Thought Processes: Improving illogical  Orientation:Full (Time, Place and Person)  Thought Content:Illogical; Delusions; Tangential  Hallucinations: Denies  Ideas of Reference:Delusions  Suicidal Thoughts: Denies  Homicidal Thoughts: Denies   Sensorium  Memory:  Immediate Fair; Remote Fair  Judgment: Impaired  Insight: Shallow   Executive Functions  Concentration: Poor  Attention Span: Poor  Recall: [employer] of Knowledge: Fair  Language: Fair   Psychomotor Activity  Psychomotor Activity: No data recorded  Musculoskeletal: Strength & Muscle Tone: within normal limits Gait & Station: normal Assets  Assets: Manufacturing Systems Engineer; Desire for  Improvement    Physical Exam: Physical Exam Vitals and nursing note reviewed.    ROS Blood pressure 111/66, pulse (!) 50, temperature 98.1 F (36.7 C), resp. rate 18, height 5' 5 (1.651 m), weight 81 kg, SpO2 98%. Body mass index is 29.7 kg/m.  Diagnosis: Principal Problem:   Bilateral lower extremity edema Active Problems:   Hypercholesteremia   Schizoaffective disorder, bipolar type (HCC)   Chest pain   Sinus tachycardia   Hypotension   Controlled type 2 diabetes mellitus without complication, without long-term current use of insulin (HCC)   Hyponatremia  Schizoaffective disorder bipolar type  Clinical Decision Making: Patient is currently admitted for worsening psychosis, manic symptoms, tangential grandiosity.  She needs inpatient hospitalization for medication management and close monitoring.  Patient is unable to make decisions on her own as she is unable to appreciate and understand her mental health history, the current need for treatment.  [facility] has screened her case and for further evaluation for legal guardianship. [facility] screened in her case. Sister and mom are involved in her care.  Treatment Plan Summary:  Safety and Monitoring:             -- inoluntary admission to inpatient psychiatric unit for safety, stabilization and treatment             -- Daily contact with patient to assess and evaluate symptoms and progress in treatment             -- Patient's case to be discussed in multi-disciplinary team meeting             -- Observation Level: q15 minute checks             -- Vital signs:  q12 hours             -- Precautions: suicide, elopement, and assault   2. Psychiatric Diagnoses and Treatment:  Discontinue Depakote  as patient is noncompliant ContGeodon 20 mg nightly as patient is agreeable to take Geodon  Clozaril  100 mg twice daily enforced with zyprexa  10mg  BID- Cymbalta  40 mg daily Trileptal  150 mg twice daily is added -- The  risks/benefits/side-effects/alternatives to this medication were discussed in detail with the patient and time was given for questions. The patient consents to medication trial.                -- Metabolic profile and EKG monitoring obtained while on an atypical antipsychotic (BMI: Lipid Panel: HbgA1c: QTc:)              -- Encouraged patient to participate in unit milieu and in scheduled group therapies                            3. Medical Issues Being Addressed:   Chest pain Sinus tachycardia HTN (hypertension) Bilateral lower extremity edema Hypercholesteremia  12/30:  Troponin negative x 2. Echocardiogram done with pending results.  Small improvement in lower extremity edema so continuing p.o. Lasix .  Will need BMP every other day.  4. Discharge Planning:   -- Social work and case management to assist with  discharge planning and identification of hospital follow-up needs prior to discharge  -- Estimated LOS: 3-4 days  Valerie Zehring, MD 07/25/2024, 3:50 PM

## 2024-07-26 DIAGNOSIS — F250 Schizoaffective disorder, bipolar type: Secondary | ICD-10-CM | POA: Diagnosis not present

## 2024-07-26 LAB — GLUCOSE, CAPILLARY: Glucose-Capillary: 172 mg/dL — ABNORMAL HIGH (ref 70–99)

## 2024-07-26 MED ORDER — CLOZAPINE 25 MG PO TABS
125.0000 mg | ORAL_TABLET | Freq: Two times a day (BID) | ORAL | Status: DC
  Administered 2024-07-26 – 2024-07-28 (×5): 125 mg via ORAL
  Filled 2024-07-26 (×5): qty 1

## 2024-07-26 MED ORDER — OLANZAPINE 10 MG IM SOLR: 10.0000 mg | Freq: Two times a day (BID) | INTRAMUSCULAR | Status: DC

## 2024-07-26 MED ORDER — ARIPIPRAZOLE 5 MG PO TABS
10.0000 mg | ORAL_TABLET | Freq: Every day | ORAL | Status: DC
  Administered 2024-07-26 – 2024-07-28 (×3): 10 mg via ORAL
  Filled 2024-07-26 (×3): qty 2

## 2024-07-26 NOTE — Group Note (Signed)
 Date:  07/26/2024 Time:  10:26 AM  Group Topic/Focus:  Orientation:   The focus of this group is to educate the patient on the purpose and policies of crisis stabilization and provide a format to answer questions about their admission.  The group details unit policies and expectations of patients while admitted.  Effective geriatric exercises focus on aerobic activity, strength, balance, and flexibility, including brisk walking, swimming, chair yoga, tai chi, using resistance bands, and simple bodyweight moves (like chair squats, wall push-ups) to boost heart health, prevent falls, maintain independence, and improve overall well-being, aiming for 150 mins of moderate aerobic activity and 2+ days of strength training weekly.   Participation Level:  Active  Participation Quality:  Appropriate  Affect:  Appropriate  Cognitive:  Appropriate  Insight: Appropriate  Engagement in Group:  Engaged  Modes of Intervention:  Activity and Discussion  Additional Comments:  N/A  [name] 07/26/2024, 10:26 AM

## 2024-07-26 NOTE — [facility] Group Notes (Signed)
 Spirituality Group   Description: Participant directed exploration of values, beliefs and meaning   **Focus on Gratitude: Invite reflection on sources of gratitude (external/internal); goal to invite internal gratitude to foster 1) reconnection with life-giving activities 2) self-compassion.   Following a brief framework of chaplains role and ground rules of group behavior, participants are invited to share concerns or questions that engage spiritual life. Emphasis placed on common themes and shared experiences and ways to make meaning and clarify living into ones values.   Theory/Process/Goal: Utilize the theoretical framework of group therapy established by Celena Kite, Relational Cultural Theory and Rogerian approaches to facilitate relational empathy and use of the here and now to foster reflection, self-awareness, and sharing.   Observations: Valerie [name] was engaged in the group (0.5) but frequently want to sing or participate in ways that disrupted others. This chaplain gently asserted boundaries and appreciation for observing limits on behavior when requested.  Kindall Swaby L. Delores HERO.Div

## 2024-07-26 NOTE — Plan of Care (Signed)
   Problem: Coping: Goal: Ability to verbalize frustrations and anger appropriately will improve Outcome: Progressing Goal: Ability to demonstrate self-control will improve Outcome: Progressing

## 2024-07-26 NOTE — Group Note (Signed)
 Date:  07/26/2024 Time:  9:48 PM  Group Topic/Focus:  Wrap-Up Group:   The focus of this group is to help patients review their daily goal of treatment and discuss progress on daily workbooks.    Participation Level:  Active  Participation Quality:  Appropriate  Affect:  Appropriate  Cognitive:  Alert  Insight: Appropriate  Engagement in Group:  Engaged  Modes of Intervention:  Discussion  Additional Comments:    Valerie [name] 07/26/2024, 9:48 PM

## 2024-07-26 NOTE — Group Note (Signed)
 Date:  07/26/2024 Time:  4:10 PM  Group Topic/Focus:  Goals Group:   The focus of this group is to help patients establish daily goals to achieve during treatment and discuss how the patient can incorporate goal setting into their daily lives to aide in recovery.    Participation Level:  Active  Participation Quality:  Appropriate  Affect:  Appropriate  Cognitive:  Appropriate  Insight: Appropriate  Engagement in Group:  Engaged  Modes of Intervention:  Discussion   Valerie [name] 07/26/2024, 4:10 PM

## 2024-07-26 NOTE — Plan of Care (Signed)
   Problem: Education: Goal: Emotional status will improve Outcome: Not Progressing Goal: Mental status will improve Outcome: Not Progressing   Problem: Activity: Goal: Interest or engagement in activities will improve Outcome: Progressing

## 2024-07-26 NOTE — Progress Notes (Signed)
 [facility] MD Progress Note   8:52 PM Valerie [name]  MRN:  [id number]   Valerie [name] is a 64 year old female presenting voluntary to [facility] requesting a psychiatric evaluation for worsening psychosis. Patient has history of schizophrenia. Patient denied SI, HI, psychosis and alcohol/drug usage. Patient resides at [facility]. Patient reports people are coming by my door talking loud, yelling and screaming and coming in my room with keys. Patient reports having multiple medical Afibs and blood clots due to this. Patient reports people are doing things in the dining room. When asked if she called EMS, patient stated I called EMS, I stripped searched over the phone and called the authorities. Patient is admitted to [facility] unit with Q15 min safety monitoring. Multidisciplinary team approach is offered. Medication management; group/milieu therapy is offered.   Sister Jay Kempe : [phone]  Subjective:  Chart reviewed, case discussed in multidisciplinary meeting, patient seen during rounds.   Patient remains tangential, delusional with very poor personal boundaries, sexually disinhibited approaching female staff and patient's.  Patient remains on involuntary commitment.  Patient med adjustments will be done to address the behaviors.  Geodon  will be discontinued and Abilify  will be added.  If patient refuses and forced medication will be implemented after being evaluated by second physician.  Patient denies SI/HI/plan.  She randomly comes out of her room and yells out for the provider.  Patient had to be redirected multiple times. Past Psychiatric History: see h&P Family History:  Family History  Problem Relation Age of Onset   Colon cancer Neg Hx    Celiac disease Neg Hx    Inflammatory bowel disease Neg Hx    Social History:  Social History   Substance and Sexual Activity  Alcohol Use Never     Social History   Substance and Sexual Activity  Drug Use  Never    Social History   Socioeconomic History   Marital status: Single    Spouse name: Not on file   Number of children: Not on file   Years of education: Not on file   Highest education level: Not on file  Occupational History   Not on file  Tobacco Use   Smoking status: Never   Smokeless tobacco: Never  Substance and Sexual Activity   Alcohol use: Never   Drug use: Never   Sexual activity: Not Currently    Birth control/protection: Pill  Other Topics Concern   Not on file  Social History Narrative   Not on file   Social Drivers of Health   Tobacco Use: Low Risk (06/19/2024)   Patient History    Smoking Tobacco Use: Never    Smokeless Tobacco Use: Never    Passive Exposure: Not on file  Financial Resource Strain: Not on file  Food Insecurity: Unknown (06/19/2024)   Epic    Worried About Programme Researcher, Broadcasting/film/video in the Last Year: Never true    [employer] of Food in the Last Year: Patient declined  Transportation Needs: No Transportation Needs (06/19/2024)   Epic    Lack of Transportation (Medical): No    Lack of Transportation (Non-Medical): No  Physical Activity: Not on file  Stress: Not on file  Social Connections: Not on file  Depression (PHQ2-9): Low Risk (12/21/2022)   Depression (PHQ2-9)    PHQ-2 Score: 0  Alcohol Screen: Low Risk (06/19/2024)   Alcohol Screen    Last Alcohol Screening Score (AUDIT): 0  Housing: Low Risk (06/19/2024)  Epic    Unable to Pay for Housing in the Last Year: No    Number of Times Moved in the Last Year: 0    Homeless in the Last Year: No  Utilities: Not At Risk (06/19/2024)   Epic    Threatened with loss of utilities: No  Health Literacy: Not on file   Past Medical History:  Past Medical History:  Diagnosis Date   Chronic lower extremity pain (2ry area of Pain) (Right) 12/21/2022   Chronic pain syndrome 12/20/2022   DDD (degenerative disc disease), lumbosacral 12/21/2022   HTN (hypertension)    Hypercholesteremia    Prediabetes     Schizophrenia (HCC)     Past Surgical History:  Procedure Laterality Date   COLONOSCOPY  2014   Dr. Tobie: Pancolonic diverticulosis, tortuous colon, next colonoscopy 2024   TONSILLECTOMY      Current Medications: Current Facility-Administered Medications  Medication Dose Route Frequency Provider Last Rate Last Admin   acetaminophen  (TYLENOL ) tablet 650 mg  650 mg Oral Q6H PRN Onuoha, Chinwendu V, NP   650 mg at 07/11/24 1835   ARIPiprazole  (ABILIFY ) tablet 10 mg  10 mg Oral Daily Eilee Schader, MD       aspirin  EC tablet 81 mg  81 mg Oral Daily Bobbitt, Shalon E, NP   81 mg at 07/26/24 0825   cholecalciferol  (VITAMIN D3) 25 MCG (1000 UNIT) tablet 1,000 Units  1,000 Units Oral Weekly Dylin Breeden, MD   1,000 Units at 07/25/24 1230   cloZAPine  (CLOZARIL ) tablet 125 mg  125 mg Oral BID Mordechai Matuszak, MD       Or   OLANZapine  (ZYPREXA ) injection 10 mg  10 mg Intramuscular BID Rylee Nuzum, MD       dapagliflozin  propanediol (FARXIGA ) tablet 10 mg  10 mg Oral Daily Josette Ade, MD   10 mg at 07/26/24 0825   diclofenac  Sodium (VOLTAREN ) 1 % topical gel 4 g  4 g Topical QID PRN Madaram, Kondal R, MD       famotidine  (PEPCID ) tablet 40 mg  40 mg Oral Daily Shrivastava, Aryendra, MD   40 mg at 07/26/24 0825   fluticasone  (FLONASE ) 50 MCG/ACT nasal spray 1 spray  1 spray Each Nare Daily PRN Madaram, Kondal R, MD       furosemide  (LASIX ) tablet 40 mg  40 mg Oral 2 times per day Josette Ade, MD   40 mg at 07/26/24 1629   gabapentin  (NEURONTIN ) capsule 300 mg  300 mg Oral TID Bobbitt, Shalon E, NP   300 mg at 07/26/24 1629   hydrOXYzine  (ATARAX ) tablet 25 mg  25 mg Oral TID PRN Bobbitt, Shalon E, NP   25 mg at 07/20/24 1712   magnesium  hydroxide (MILK OF MAGNESIA) suspension 30 mL  30 mL Oral Daily PRN Bobbitt, Shalon E, NP       metoprolol  succinate (TOPROL -XL) 24 hr tablet 25 mg  25 mg Oral BID Josette Ade, MD   25 mg at 07/26/24 9167   multivitamin with minerals tablet  1 tablet  1 tablet Oral Daily Bobbitt, Shalon E, NP   1 tablet at 07/26/24 0825   naproxen  (NAPROSYN ) tablet 375 mg  375 mg Oral TID WC Iva Posten, MD   375 mg at 07/26/24 1629   OLANZapine  (ZYPREXA ) injection 5 mg  5 mg Intramuscular TID PRN Bobbitt, Shalon E, NP   5 mg at 06/22/24 2259   OLANZapine  zydis (ZYPREXA ) disintegrating tablet 5 mg  5 mg Oral  TID PRN Bobbitt, Shalon E, NP   5 mg at 07/26/24 0825   ondansetron  (ZOFRAN ) tablet 4 mg  4 mg Oral Q8H PRN Bobbitt, Shalon E, NP   4 mg at 07/11/24 1835   Oxcarbazepine  (TRILEPTAL ) tablet 300 mg  300 mg Oral BID Jeshurun Oaxaca, MD   300 mg at 07/26/24 9175   potassium chloride  SA (KLOR-CON  M) CR tablet 20 mEq  20 mEq Oral BID Bobbitt, Shalon E, NP   20 mEq at 07/26/24 9175   rosuvastatin  (CRESTOR ) tablet 5 mg  5 mg Oral Daily Bobbitt, Shalon E, NP   5 mg at 07/26/24 0825   traZODone  (DESYREL ) tablet 50 mg  50 mg Oral QHS PRN Bobbitt, Shalon E, NP   50 mg at 07/25/24 1930   zolpidem  (AMBIEN ) tablet 10 mg  10 mg Oral QHS Asher Torpey, MD   10 mg at 07/25/24 1930    Lab Results:  Results for orders placed or performed during the hospital encounter of 06/19/24 (from the past 48 hours)  Glucose, capillary     Status: Abnormal   Collection Time: 07/25/24  6:36 AM  Result Value Ref Range   Glucose-Capillary 113 (H) 70 - 99 mg/dL    Comment: Glucose reference range applies only to samples taken after fasting for at least 8 hours.  Glucose, capillary     Status: Abnormal   Collection Time: 07/25/24  7:28 AM  Result Value Ref Range   Glucose-Capillary 158 (H) 70 - 99 mg/dL    Comment: Glucose reference range applies only to samples taken after fasting for at least 8 hours.  Glucose, capillary     Status: Abnormal   Collection Time: 07/26/24  6:16 AM  Result Value Ref Range   Glucose-Capillary 172 (H) 70 - 99 mg/dL    Comment: Glucose reference range applies only to samples taken after fasting for at least 8 hours.        Blood  Alcohol level:  Lab Results  Component Value Date   [facility] <15 06/18/2024    Metabolic Disorder Labs: Lab Results  Component Value Date   HGBA1C 5.9 (H) 06/28/2024   MPG 122.63 06/28/2024   No results found for: PROLACTIN Lab Results  Component Value Date   CHOL 156 06/28/2024   TRIG 89 06/28/2024   HDL 70 06/28/2024   CHOLHDL 2.2 06/28/2024   VLDL 18 06/28/2024   LDLCALC 69 06/28/2024    Physical Findings: AIMS:  , ,  ,  ,    CIWA:    COWS:      Psychiatric Specialty Exam:  Presentation  General Appearance:  Casual  Eye Contact: Fleeting  Speech: Normal Rate  Speech Volume: Increased    Mood and Affect  Mood: Euphoric  Affect: stable  Thought Process  Thought Processes: Improving illogical  Orientation:Full (Time, Place and Person)  Thought Content:Illogical; Delusions; Tangential  Hallucinations: Denies  Ideas of Reference:Delusions  Suicidal Thoughts: Denies  Homicidal Thoughts: Denies   Sensorium  Memory: Immediate Fair; Remote Fair  Judgment: Impaired  Insight: Shallow   Executive Functions  Concentration: Poor  Attention Span: Poor  Recall: [employer] of Knowledge: Fair  Language: Fair   Psychomotor Activity  Psychomotor Activity: No data recorded  Musculoskeletal: Strength & Muscle Tone: within normal limits Gait & Station: normal Assets  Assets: Manufacturing Systems Engineer; Desire for Improvement    Physical Exam: Physical Exam Vitals and nursing note reviewed.    ROS Blood pressure 127/72, pulse 93,  temperature 97.9 F (36.6 C), temperature source Oral, resp. rate 18, height 5' 5 (1.651 m), weight 81 kg, SpO2 100%. Body mass index is 29.7 kg/m.  Diagnosis: Principal Problem:   Bilateral lower extremity edema Active Problems:   Hypercholesteremia   Schizoaffective disorder, bipolar type (HCC)   Chest pain   Sinus tachycardia   Hypotension   Controlled type 2 diabetes mellitus without  complication, without long-term current use of insulin (HCC)   Hyponatremia  Schizoaffective disorder bipolar type  Clinical Decision Making: Patient is currently admitted for worsening psychosis, manic symptoms, tangential grandiosity.  She needs inpatient hospitalization for medication management and close monitoring.  Patient is unable to make decisions on her own as she is unable to appreciate and understand her mental health history, the current need for treatment.  [facility] has screened her case and for further evaluation for legal guardianship. [facility] screened in her case. Sister and mom are involved in her care.  Treatment Plan Summary:  Safety and Monitoring:             -- inoluntary admission to inpatient psychiatric unit for safety, stabilization and treatment             -- Daily contact with patient to assess and evaluate symptoms and progress in treatment             -- Patient's case to be discussed in multi-disciplinary team meeting             -- Observation Level: q15 minute checks             -- Vital signs:  q12 hours             -- Precautions: suicide, elopement, and assault   2. Psychiatric Diagnoses and Treatment:  Discontinue Depakote  as patient is noncompliant Discontinue Geodon  20 mg nightly as poor response Increased Clozaril  125 mg twice daily enforced with zyprexa  10mg  BID- Cymbalta  40 mg daily-discontinue to minimize manic Trilepta 300 mg twice daily Abilify  10 mg is added -- The risks/benefits/side-effects/alternatives to this medication were discussed in detail with the patient and time was given for questions. The patient consents to medication trial.                -- Metabolic profile and EKG monitoring obtained while on an atypical antipsychotic (BMI: Lipid Panel: HbgA1c: QTc:)              -- Encouraged patient to participate in unit milieu and in scheduled group therapies                            3. Medical Issues Being Addressed:   Chest pain Sinus  tachycardia HTN (hypertension) Bilateral lower extremity edema Hypercholesteremia  12/30:  Troponin negative x 2. Echocardiogram done with pending results.  Small improvement in lower extremity edema so continuing p.o. Lasix .  Will need BMP every other day.  4. Discharge Planning:   -- Social work and case management to assist with discharge planning and identification of hospital follow-up needs prior to discharge  -- Estimated LOS: 3-4 days  Allyn Foil, MD 07/26/2024, 8:52 PM

## 2024-07-27 DIAGNOSIS — F250 Schizoaffective disorder, bipolar type: Secondary | ICD-10-CM | POA: Diagnosis not present

## 2024-07-27 LAB — CBC WITH DIFFERENTIAL/PLATELET
Abs Immature Granulocytes: 0.03 K/uL (ref 0.00–0.07)
Basophils Absolute: 0.1 K/uL (ref 0.0–0.1)
Basophils Relative: 1 %
Eosinophils Absolute: 0.4 K/uL (ref 0.0–0.5)
Eosinophils Relative: 4 %
HCT: 36.9 % (ref 36.0–46.0)
Hemoglobin: 11.5 g/dL — ABNORMAL LOW (ref 12.0–15.0)
Immature Granulocytes: 0 %
Lymphocytes Relative: 44 %
Lymphs Abs: 3.5 K/uL (ref 0.7–4.0)
MCH: 27.4 pg (ref 26.0–34.0)
MCHC: 31.2 g/dL (ref 30.0–36.0)
MCV: 87.9 fL (ref 80.0–100.0)
Monocytes Absolute: 0.8 K/uL (ref 0.1–1.0)
Monocytes Relative: 9 %
Neutro Abs: 3.4 K/uL (ref 1.7–7.7)
Neutrophils Relative %: 42 %
Platelets: 378 K/uL (ref 150–400)
RBC: 4.2 MIL/uL (ref 3.87–5.11)
RDW: 13.6 % (ref 11.5–15.5)
WBC: 8.1 K/uL (ref 4.0–10.5)
nRBC: 0 % (ref 0.0–0.2)

## 2024-07-27 LAB — GLUCOSE, CAPILLARY: Glucose-Capillary: 173 mg/dL — ABNORMAL HIGH (ref 70–99)

## 2024-07-27 NOTE — Plan of Care (Signed)
   Problem: Activity: Goal: Interest or engagement in activities will improve Outcome: Progressing Goal: Sleeping patterns will improve Outcome: Progressing

## 2024-07-27 NOTE — Progress Notes (Signed)
" °   07/26/24 2300  Psych Admission Type (Psych Patients Only)  Admission Status Involuntary  Psychosocial Assessment  Patient Complaints None  Eye Contact Fair  Facial Expression Animated  Affect Labile  Speech Tangential  Interaction Assertive  Motor Activity Fidgety  Appearance/Hygiene In scrubs  Behavior Characteristics Impulsive  Mood Labile  Thought Process  Coherency Disorganized  Content Preoccupation  Delusions Grandeur  Perception Derealization  Hallucination None reported or observed  Judgment Impaired  Confusion Mild  Danger to Self  Current suicidal ideation? Denies  Agreement Not to Harm Self Yes  Description of Agreement Verbal  Danger to Others  Danger to Others None reported or observed    "

## 2024-07-27 NOTE — Progress Notes (Signed)
" °   07/27/24 1200  Psych Admission Type (Psych Patients Only)  Admission Status Involuntary  Psychosocial Assessment  Patient Complaints None  Eye Contact Fair  Facial Expression Animated  Affect Labile  Speech Tangential  Interaction Assertive  Motor Activity Fidgety  Appearance/Hygiene In scrubs  Behavior Characteristics Impulsive  Mood Labile  Thought Process  Coherency Disorganized  Content Preoccupation  Delusions Grandeur  Perception Derealization  Hallucination None reported or observed  Judgment Impaired  Confusion Mild  Danger to Self  Current suicidal ideation? Denies  Danger to Others  Danger to Others None reported or observed    "

## 2024-07-27 NOTE — Group Note (Signed)
 Date:  07/27/2024 Time:  10:26 AM  Group Topic/Focus:  Orientation:   The focus of this group is to educate the patient on the purpose and policies of crisis stabilization and provide a format to answer questions about their admission.  The group details unit policies and expectations of patients while admitted.  Exercise for geriatric patients offers vast benefits, significantly improving physical function, mental health, and independence by reducing fall risk, managing chronic diseases (heart disease, diabetes, cancer), enhancing cognitive function, boosting mood, improving sleep, strengthening bones, and supporting longer, healthier lives. Regular activity helps combat age-related decline, preventing frailty and enabling older adults to live more independently and with better overall quality of life.   Participation Level:  Active  Participation Quality:  Inattentive  Affect:  Appropriate  Cognitive: Appropriate  Insight: Limited  Engagement in Group:  Limited  Modes of Intervention:  Activity, Discussion, and Education  Additional Comments:  N/A  [name] 07/27/2024, 10:26 AM

## 2024-07-27 NOTE — Group Note (Signed)
 Recreation Therapy Group Note   Group Topic:General Recreation  Group Date: 07/27/2024 Start Time: 1400 End Time: 1450 Facilitators: Celestia Jeoffrey BRAVO, LRT, CTRS Location: Dayroom  Group Description: Bingo. Patients played multiple rounds of bingo. LRT and pts discussed the definition of leisure, things they do in their free time outside of the hospital, and how bingo is also a leisure activity. Pts received a coloring book, word search book, or journal as a prize.    Goal Area(s) Addressed:  Patient will identify a current leisure interest.  Patient will learn the definition of leisure. Patient will have the opportunity to try a new leisure activity. Patient will communicate with peers and LRT.   Affect/Mood: Full range   Participation Level: Minimal    Clinical Observations/Individualized Feedback: Valerie [name] was in and out of group.   Plan: Continue to engage patient in RT group sessions 2-3x/week.   Jeoffrey BRAVO Celestia, LRT, CTRS 07/27/2024 4:49 PM

## 2024-07-27 NOTE — Progress Notes (Signed)
 [facility] MD Progress Note   1:42 PM Valerie [name]  MRN:  [id number]   Valerie [name] is a 64 year old female presenting voluntary to [facility] requesting a psychiatric evaluation for worsening psychosis. Patient has history of schizophrenia. Patient denied SI, HI, psychosis and alcohol/drug usage. Patient resides at [facility]. Patient reports people are coming by my door talking loud, yelling and screaming and coming in my room with keys. Patient reports having multiple medical Afibs and blood clots due to this. Patient reports people are doing things in the dining room. When asked if she called EMS, patient stated I called EMS, I stripped searched over the phone and called the authorities. Patient is admitted to [facility] unit with Q15 min safety monitoring. Multidisciplinary team approach is offered. Medication management; group/milieu therapy is offered.   Sister Paloma Grange : [phone]  Subjective:  Chart reviewed, case discussed in multidisciplinary meeting, patient seen during rounds.   Patient is noted to be walking in the hallway at times screaming loudly out of nowhere with no triggers.  She was also noted to be touching other patients and received as needed medications Zyprexa .  She will greet the provider multiple times.  She offers no complaints.  She denies SI/HI/plan and denies hallucinations.  She remains disinhibited with poor personal boundaries and intrusive.  She reports she is waiting for her family to come take her home. Past Psychiatric History: see h&P Family History:  Family History  Problem Relation Age of Onset   Colon cancer Neg Hx    Celiac disease Neg Hx    Inflammatory bowel disease Neg Hx    Social History:  Social History   Substance and Sexual Activity  Alcohol Use Never     Social History   Substance and Sexual Activity  Drug Use Never    Social History   Socioeconomic History   Marital status: Single    Spouse name:  Not on file   Number of children: Not on file   Years of education: Not on file   Highest education level: Not on file  Occupational History   Not on file  Tobacco Use   Smoking status: Never   Smokeless tobacco: Never  Substance and Sexual Activity   Alcohol use: Never   Drug use: Never   Sexual activity: Not Currently    Birth control/protection: Pill  Other Topics Concern   Not on file  Social History Narrative   Not on file   Social Drivers of Health   Tobacco Use: Low Risk (06/19/2024)   Patient History    Smoking Tobacco Use: Never    Smokeless Tobacco Use: Never    Passive Exposure: Not on file  Financial Resource Strain: Not on file  Food Insecurity: Unknown (06/19/2024)   Epic    Worried About Programme Researcher, Broadcasting/film/video in the Last Year: Never true    [employer] of Food in the Last Year: Patient declined  Transportation Needs: No Transportation Needs (06/19/2024)   Epic    Lack of Transportation (Medical): No    Lack of Transportation (Non-Medical): No  Physical Activity: Not on file  Stress: Not on file  Social Connections: Not on file  Depression (PHQ2-9): Low Risk (12/21/2022)   Depression (PHQ2-9)    PHQ-2 Score: 0  Alcohol Screen: Low Risk (06/19/2024)   Alcohol Screen    Last Alcohol Screening Score (AUDIT): 0  Housing: Low Risk (06/19/2024)   Epic    Unable  to Pay for Housing in the Last Year: No    Number of Times Moved in the Last Year: 0    Homeless in the Last Year: No  Utilities: Not At Risk (06/19/2024)   Epic    Threatened with loss of utilities: No  Health Literacy: Not on file   Past Medical History:  Past Medical History:  Diagnosis Date   Chronic lower extremity pain (2ry area of Pain) (Right) 12/21/2022   Chronic pain syndrome 12/20/2022   DDD (degenerative disc disease), lumbosacral 12/21/2022   HTN (hypertension)    Hypercholesteremia    Prediabetes    Schizophrenia (HCC)     Past Surgical History:  Procedure Laterality Date    COLONOSCOPY  2014   Dr. Tobie: Pancolonic diverticulosis, tortuous colon, next colonoscopy 2024   TONSILLECTOMY      Current Medications: Current Facility-Administered Medications  Medication Dose Route Frequency Provider Last Rate Last Admin   acetaminophen  (TYLENOL ) tablet 650 mg  650 mg Oral Q6H PRN Onuoha, Chinwendu V, NP   650 mg at 07/11/24 1835   ARIPiprazole  (ABILIFY ) tablet 10 mg  10 mg Oral Daily Haneen Bernales, MD   10 mg at 07/27/24 1001   aspirin  EC tablet 81 mg  81 mg Oral Daily Bobbitt, Shalon E, NP   81 mg at 07/27/24 1001   cholecalciferol  (VITAMIN D3) 25 MCG (1000 UNIT) tablet 1,000 Units  1,000 Units Oral Weekly Mindee Robledo, MD   1,000 Units at 07/25/24 1230   cloZAPine  (CLOZARIL ) tablet 125 mg  125 mg Oral BID Mende Biswell, MD   125 mg at 07/27/24 1001   Or   OLANZapine  (ZYPREXA ) injection 10 mg  10 mg Intramuscular BID Tayonna Bacha, MD       dapagliflozin  propanediol (FARXIGA ) tablet 10 mg  10 mg Oral Daily Josette Ade, MD   10 mg at 07/27/24 1001   diclofenac  Sodium (VOLTAREN ) 1 % topical gel 4 g  4 g Topical QID PRN Madaram, Kondal R, MD       famotidine  (PEPCID ) tablet 40 mg  40 mg Oral Daily Shrivastava, Aryendra, MD   40 mg at 07/27/24 1001   fluticasone  (FLONASE ) 50 MCG/ACT nasal spray 1 spray  1 spray Each Nare Daily PRN Madaram, Kondal R, MD       furosemide  (LASIX ) tablet 40 mg  40 mg Oral 2 times per day Josette Ade, MD   40 mg at 07/27/24 1001   gabapentin  (NEURONTIN ) capsule 300 mg  300 mg Oral TID Bobbitt, Shalon E, NP   300 mg at 07/27/24 1001   hydrOXYzine  (ATARAX ) tablet 25 mg  25 mg Oral TID PRN Bobbitt, Shalon E, NP   25 mg at 07/26/24 2142   magnesium  hydroxide (MILK OF MAGNESIA) suspension 30 mL  30 mL Oral Daily PRN Bobbitt, Shalon E, NP       metoprolol  succinate (TOPROL -XL) 24 hr tablet 25 mg  25 mg Oral BID Josette Ade, MD   25 mg at 07/27/24 1001   multivitamin with minerals tablet 1 tablet  1 tablet Oral Daily Bobbitt,  Shalon E, NP   1 tablet at 07/27/24 1000   naproxen  (NAPROSYN ) tablet 375 mg  375 mg Oral TID WC Daesia Zylka, MD   375 mg at 07/27/24 1250   OLANZapine  (ZYPREXA ) injection 5 mg  5 mg Intramuscular TID PRN Bobbitt, Shalon E, NP   5 mg at 06/22/24 2259   OLANZapine  zydis (ZYPREXA ) disintegrating tablet 5 mg  5 mg  Oral TID PRN Bobbitt, Shalon E, NP   5 mg at 07/27/24 1001   ondansetron  (ZOFRAN ) tablet 4 mg  4 mg Oral Q8H PRN Bobbitt, Shalon E, NP   4 mg at 07/11/24 1835   Oxcarbazepine  (TRILEPTAL ) tablet 300 mg  300 mg Oral BID Wynter Grave, MD   300 mg at 07/27/24 1001   potassium chloride  SA (KLOR-CON  M) CR tablet 20 mEq  20 mEq Oral BID Bobbitt, Shalon E, NP   20 mEq at 07/27/24 0959   rosuvastatin  (CRESTOR ) tablet 5 mg  5 mg Oral Daily Bobbitt, Shalon E, NP   5 mg at 07/27/24 9040   traZODone  (DESYREL ) tablet 50 mg  50 mg Oral QHS PRN Bobbitt, Shalon E, NP   50 mg at 07/26/24 2141   zolpidem  (AMBIEN ) tablet 10 mg  10 mg Oral QHS Lynze Reddy, MD   10 mg at 07/26/24 2142    Lab Results:  Results for orders placed or performed during the hospital encounter of 06/19/24 (from the past 48 hours)  Glucose, capillary     Status: Abnormal   Collection Time: 07/26/24  6:16 AM  Result Value Ref Range   Glucose-Capillary 172 (H) 70 - 99 mg/dL    Comment: Glucose reference range applies only to samples taken after fasting for at least 8 hours.  CBC with Differential/Platelet     Status: Abnormal   Collection Time: 07/27/24  6:41 AM  Result Value Ref Range   WBC 8.1 4.0 - 10.5 K/uL   RBC 4.20 3.87 - 5.11 MIL/uL   Hemoglobin 11.5 (L) 12.0 - 15.0 g/dL   HCT 63.0 63.9 - 53.9 %   MCV 87.9 80.0 - 100.0 fL   MCH 27.4 26.0 - 34.0 pg   MCHC 31.2 30.0 - 36.0 g/dL   RDW 86.3 88.4 - 84.4 %   Platelets 378 150 - 400 K/uL   nRBC 0.0 0.0 - 0.2 %   Neutrophils Relative % 42 %   Neutro Abs 3.4 1.7 - 7.7 K/uL   Lymphocytes Relative 44 %   Lymphs Abs 3.5 0.7 - 4.0 K/uL   Monocytes Relative 9 %    Monocytes Absolute 0.8 0.1 - 1.0 K/uL   Eosinophils Relative 4 %   Eosinophils Absolute 0.4 0.0 - 0.5 K/uL   Basophils Relative 1 %   Basophils Absolute 0.1 0.0 - 0.1 K/uL   Immature Granulocytes 0 %   Abs Immature Granulocytes 0.03 0.00 - 0.07 K/uL    Comment: Performed at [facility], [address] [place], [place] [address]  Glucose, capillary     Status: Abnormal   Collection Time: 07/27/24  7:17 AM  Result Value Ref Range   Glucose-Capillary 173 (H) 70 - 99 mg/dL    Comment: Glucose reference range applies only to samples taken after fasting for at least 8 hours.        Blood Alcohol level:  Lab Results  Component Value Date   [facility] <15 06/18/2024    Metabolic Disorder Labs: Lab Results  Component Value Date   HGBA1C 5.9 (H) 06/28/2024   MPG 122.63 06/28/2024   No results found for: PROLACTIN Lab Results  Component Value Date   CHOL 156 06/28/2024   TRIG 89 06/28/2024   HDL 70 06/28/2024   CHOLHDL 2.2 06/28/2024   VLDL 18 06/28/2024   LDLCALC 69 06/28/2024    Physical Findings: AIMS:  , ,  ,  ,    CIWA:  COWS:      Psychiatric Specialty Exam:  Presentation  General Appearance:  Casual  Eye Contact: Fleeting  Speech: Normal Rate  Speech Volume: Increased    Mood and Affect  Mood: Euphoric  Affect: stable  Thought Process  Thought Processes: Improving illogical  Orientation:Full (Time, Place and Person)  Thought Content:Illogical; Delusions; Tangential  Hallucinations: Denies  Ideas of Reference:Delusions  Suicidal Thoughts: Denies  Homicidal Thoughts: Denies   Sensorium  Memory: Immediate Fair; Remote Fair  Judgment: Impaired  Insight: Shallow   Executive Functions  Concentration: Poor  Attention Span: Poor  Recall: [employer] of Knowledge: Fair  Language: Fair   Psychomotor Activity  Psychomotor Activity: No data recorded  Musculoskeletal: Strength & Muscle Tone: within normal  limits Gait & Station: normal Assets  Assets: Manufacturing Systems Engineer; Desire for Improvement    Physical Exam: Physical Exam Vitals and nursing note reviewed.    ROS Blood pressure 127/89, pulse 90, temperature (!) 97.3 F (36.3 C), resp. rate 18, height 5' 5 (1.651 m), weight 81 kg, SpO2 100%. Body mass index is 29.7 kg/m.  Diagnosis: Principal Problem:   Bilateral lower extremity edema Active Problems:   Hypercholesteremia   Schizoaffective disorder, bipolar type (HCC)   Chest pain   Sinus tachycardia   Hypotension   Controlled type 2 diabetes mellitus without complication, without long-term current use of insulin (HCC)   Hyponatremia  Schizoaffective disorder bipolar type  Clinical Decision Making: Patient is currently admitted for worsening psychosis, manic symptoms, tangential grandiosity.  She needs inpatient hospitalization for medication management and close monitoring.  Patient is unable to make decisions on her own as she is unable to appreciate and understand her mental health history, the current need for treatment.  [facility] has screened her case and for further evaluation for legal guardianship. [facility] screened in her case. Sister and mom are involved in her care.  Treatment Plan Summary:  Safety and Monitoring:             -- inoluntary admission to inpatient psychiatric unit for safety, stabilization and treatment             -- Daily contact with patient to assess and evaluate symptoms and progress in treatment             -- Patient's case to be discussed in multi-disciplinary team meeting             -- Observation Level: q15 minute checks             -- Vital signs:  q12 hours             -- Precautions: suicide, elopement, and assault   2. Psychiatric Diagnoses and Treatment:  Discontinue Depakote  as patient is noncompliant Discontinue Geodon  20 mg nightly as poor response Increased Clozaril  125 mg twice daily enforced with zyprexa  10mg  BID- Cymbalta  40 mg  daily-discontinue to minimize manic Trilepta 300 mg twice daily Abilify  10 mg is added -- The risks/benefits/side-effects/alternatives to this medication were discussed in detail with the patient and time was given for questions. The patient consents to medication trial.                -- Metabolic profile and EKG monitoring obtained while on an atypical antipsychotic (BMI: Lipid Panel: HbgA1c: QTc:)              -- Encouraged patient to participate in unit milieu and in scheduled group therapies  3. Medical Issues Being Addressed:   Chest pain Sinus tachycardia HTN (hypertension) Bilateral lower extremity edema Hypercholesteremia  12/30:  Troponin negative x 2. Echocardiogram done with pending results.  Small improvement in lower extremity edema so continuing p.o. Lasix .  Will need BMP every other day.  4. Discharge Planning:   -- Social work and case management to assist with discharge planning and identification of hospital follow-up needs prior to discharge  -- Estimated LOS: 3-4 days  Allyn Foil, MD 07/27/2024, 1:42 PM

## 2024-07-27 NOTE — Group Note (Signed)
 Date:  07/27/2024 Time:  8:46 PM  Group Topic/Focus:  Identifying Needs:   The focus of this group is to help patients identify their personal needs that have been historically problematic and identify healthy behaviors to address their needs.    Participation Level:  Active  Participation Quality:  Appropriate  Affect:  Appropriate  Cognitive:  Appropriate  Insight: Good  Engagement in Group:  Engaged  Modes of Intervention:  Discussion  Additional Comments:    Romero Earnie Hope 07/27/2024, 8:46 PM

## 2024-07-27 NOTE — Plan of Care (Signed)
   Problem: Education: Goal: Emotional status will improve Outcome: Progressing Goal: Mental status will improve Outcome: Progressing   Problem: Activity: Goal: Interest or engagement in activities will improve Outcome: Progressing

## 2024-07-28 DIAGNOSIS — F250 Schizoaffective disorder, bipolar type: Secondary | ICD-10-CM | POA: Diagnosis not present

## 2024-07-28 LAB — GLUCOSE, CAPILLARY: Glucose-Capillary: 107 mg/dL — ABNORMAL HIGH (ref 70–99)

## 2024-07-28 MED ORDER — OLANZAPINE 10 MG IM SOLR
10.0000 mg | Freq: Two times a day (BID) | INTRAMUSCULAR | Status: DC
  Filled 2024-07-28 (×2): qty 10

## 2024-07-28 MED ORDER — ARIPIPRAZOLE 5 MG PO TABS
20.0000 mg | ORAL_TABLET | Freq: Every day | ORAL | Status: DC
  Administered 2024-07-29 – 2024-08-01 (×4): 20 mg via ORAL
  Filled 2024-07-28 (×4): qty 4

## 2024-07-28 MED ORDER — CLOZAPINE 25 MG PO TABS
150.0000 mg | ORAL_TABLET | Freq: Two times a day (BID) | ORAL | Status: AC
  Administered 2024-07-29 – 2024-08-18 (×41): 150 mg via ORAL
  Filled 2024-07-28 (×42): qty 2

## 2024-07-28 MED ORDER — CHLORPROMAZINE HCL 25 MG/ML IJ SOLN
50.0000 mg | Freq: Once | INTRAMUSCULAR | Status: AC
  Administered 2024-07-28: 50 mg via INTRAMUSCULAR
  Filled 2024-07-28: qty 2

## 2024-07-28 NOTE — Progress Notes (Signed)
 Patient exhibiting agitated behavior consisting of repeated outbursts and crying spells. Zyprexa  5mg  po adm at 0806.

## 2024-07-28 NOTE — Plan of Care (Signed)
   Problem: Education: Goal: Emotional status will improve Outcome: Not Progressing Goal: Mental status will improve Outcome: Not Progressing

## 2024-07-28 NOTE — Progress Notes (Signed)
" °   07/28/24 2200  Psych Admission Type (Psych Patients Only)  Admission Status Involuntary  Psychosocial Assessment  Patient Complaints Agitation;Anxiety;Crying spells  Eye Contact Darting  Facial Expression Angry  Affect Irritable;Preoccupied;Labile;Anxious  Speech Loud;Pressured;Tangential  Interaction Assertive  Motor Activity Restless;Slow  Appearance/Hygiene In scrubs  Behavior Characteristics Anxious;Agitated  Mood Labile;Anxious;Preoccupied;Terrified  Thought Process  Coherency Disorganized  Content Preoccupation  Delusions Grandeur;Paranoid;Religious  Perception Hallucinations  Hallucination Auditory  Judgment Impaired  Confusion Mild  Danger to Self  Current suicidal ideation? Denies  Agreement Not to Harm Self Yes  Description of Agreement verbal  Danger to Others  Danger to Others None reported or observed    "

## 2024-07-28 NOTE — Group Note (Signed)
 Date:  07/28/2024 Time:  10:04 AM  Group Topic/Focus:   Goals Group:   The focus of this group is to help patients establish daily goals to achieve during treatment and discuss how the patient can incorporate goal setting into their daily lives to aide in recovery.    Participation Level:  Minimal  Participation Quality:  Attentive and Monopolizing  Affect:  Appropriate, Excited, and Not Congruent  Cognitive:  Alert and Delusional  Insight: Appropriate and Limited  Engagement in Group:  Engaged  Modes of Intervention:  Activity and Discussion  Additional Comments:     Maglione,Jenalyn Girdner E 07/28/2024, 10:04 AM

## 2024-07-28 NOTE — Progress Notes (Signed)
 [facility] MD Progress Note   10:15 PM Valerie [name]  MRN:  [id number]   Valerie [name] is a 64 year old female presenting voluntary to [facility] requesting a psychiatric evaluation for worsening psychosis. Patient has history of schizophrenia. Patient denied SI, HI, psychosis and alcohol/drug usage. Patient resides at [facility]. Patient reports people are coming by my door talking loud, yelling and screaming and coming in my room with keys. Patient reports having multiple medical Afibs and blood clots due to this. Patient reports people are doing things in the dining room. When asked if she called EMS, patient stated I called EMS, I stripped searched over the phone and called the authorities. Patient is admitted to [facility] unit with Q15 min safety monitoring. Multidisciplinary team approach is offered. Medication management; group/milieu therapy is offered.   [name] : [phone]- called on 07/28/24  to update the med changes but went to generic voicemail Subjective:  Chart reviewed, case discussed in multidisciplinary meeting, patient seen during rounds.   Patient remains very labile, intrusive, approaching patients and staff members inappropriately.  Patient is redirected by this provider multiple times throughout the day as she calls this provider as her cousin/sister and demands to talk to her every time she sees the provider on the unit.  As patient was getting loud, screaming for no apparent reason, running out of her room saying she is a danger to herself-as needed Thorazine  IM has been administered.  Patient is not responding at all to Clozaril  in spite of increased dose and also poor response to Geodon  which was discontinued and started on Abilify  which was titrated to 20 mg today.  If patient remains manic/psychotic we will discuss a plan to switch Clozaril  to loxapine as patient has documented allergy to Risperdal/paliperidone. Past Psychiatric History:  see h&P Family History:  Family History  Problem Relation Age of Onset   Colon cancer Neg Hx    Celiac disease Neg Hx    Inflammatory bowel disease Neg Hx    Social History:  Social History   Substance and Sexual Activity  Alcohol Use Never     Social History   Substance and Sexual Activity  Drug Use Never    Social History   Socioeconomic History   Marital status: Single    Spouse name: Not on file   Number of children: Not on file   Years of education: Not on file   Highest education level: Not on file  Occupational History   Not on file  Tobacco Use   Smoking status: Never   Smokeless tobacco: Never  Substance and Sexual Activity   Alcohol use: Never   Drug use: Never   Sexual activity: Not Currently    Birth control/protection: Pill  Other Topics Concern   Not on file  Social History Narrative   Not on file   Social Drivers of Health   Tobacco Use: Low Risk (06/19/2024)   Patient History    Smoking Tobacco Use: Never    Smokeless Tobacco Use: Never    Passive Exposure: Not on file  Financial Resource Strain: Not on file  Food Insecurity: Unknown (06/19/2024)   Epic    Worried About Programme Researcher, Broadcasting/film/video in the Last Year: Never true    [employer] of Food in the Last Year: Patient declined  Transportation Needs: No Transportation Needs (06/19/2024)   Epic    Lack of Transportation (Medical): No    Lack of Transportation (Non-Medical):  No  Physical Activity: Not on file  Stress: Not on file  Social Connections: Not on file  Depression (PHQ2-9): Low Risk (12/21/2022)   Depression (PHQ2-9)    PHQ-2 Score: 0  Alcohol Screen: Low Risk (06/19/2024)   Alcohol Screen    Last Alcohol Screening Score (AUDIT): 0  Housing: Low Risk (06/19/2024)   Epic    Unable to Pay for Housing in the Last Year: No    Number of Times Moved in the Last Year: 0    Homeless in the Last Year: No  Utilities: Not At Risk (06/19/2024)   Epic    Threatened with loss of utilities: No   Health Literacy: Not on file   Past Medical History:  Past Medical History:  Diagnosis Date   Chronic lower extremity pain (2ry area of Pain) (Right) 12/21/2022   Chronic pain syndrome 12/20/2022   DDD (degenerative disc disease), 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 Tobie: Pancolonic diverticulosis, tortuous colon, next colonoscopy 2024   TONSILLECTOMY      Current Medications: Current Facility-Administered Medications  Medication Dose Route Frequency Provider Last Rate Last Admin   acetaminophen  (TYLENOL ) tablet 650 mg  650 mg Oral Q6H PRN Onuoha, Chinwendu V, NP   650 mg at 07/11/24 1835   [START ON 07/29/2024] ARIPiprazole  (ABILIFY ) tablet 20 mg  20 mg Oral Daily Audree Schrecengost, MD       aspirin  EC tablet 81 mg  81 mg Oral Daily Bobbitt, Shalon E, NP   81 mg at 07/28/24 9077   cholecalciferol  (VITAMIN D3) 25 MCG (1000 UNIT) tablet 1,000 Units  1,000 Units Oral Weekly Bayla Mcgovern, MD   1,000 Units at 07/25/24 1230   [START ON 07/29/2024] cloZAPine  (CLOZARIL ) tablet 150 mg  150 mg Oral BID Cyler Kappes, MD       Or   NOREEN ON 07/29/2024] OLANZapine  (ZYPREXA ) injection 10 mg  10 mg Intramuscular BID Neeva Trew, MD       dapagliflozin  propanediol (FARXIGA ) tablet 10 mg  10 mg Oral Daily Josette Ade, MD   10 mg at 07/28/24 9078   diclofenac  Sodium (VOLTAREN ) 1 % topical gel 4 g  4 g Topical QID PRN Madaram, Kondal R, MD       famotidine  (PEPCID ) tablet 40 mg  40 mg Oral Daily Shrivastava, Aryendra, MD   40 mg at 07/28/24 9078   fluticasone  (FLONASE ) 50 MCG/ACT nasal spray 1 spray  1 spray Each Nare Daily PRN Madaram, Kondal R, MD       furosemide  (LASIX ) tablet 40 mg  40 mg Oral 2 times per day Josette Ade, MD   40 mg at 07/28/24 1622   gabapentin  (NEURONTIN ) capsule 300 mg  300 mg Oral TID Bobbitt, Shalon E, NP    300 mg at 07/28/24 2119   hydrOXYzine  (ATARAX ) tablet 25 mg  25 mg Oral TID PRN Bobbitt, Shalon E, NP   25 mg at 07/28/24 2119   magnesium  hydroxide (MILK OF MAGNESIA) suspension 30 mL  30 mL Oral Daily PRN Bobbitt, Shalon E, NP       metoprolol  succinate (TOPROL -XL) 24 hr tablet 25 mg  25 mg Oral BID Josette Ade, MD   25 mg at 07/27/24 2058   multivitamin with minerals tablet 1 tablet  1 tablet Oral Daily Bobbitt, Shalon  E, NP   1 tablet at 07/28/24 9077   naproxen  (NAPROSYN ) tablet 375 mg  375 mg Oral TID WC Charolett Yarrow, MD   375 mg at 07/28/24 1621   OLANZapine  (ZYPREXA ) injection 5 mg  5 mg Intramuscular TID PRN Bobbitt, Shalon E, NP   5 mg at 06/22/24 2259   OLANZapine  zydis (ZYPREXA ) disintegrating tablet 5 mg  5 mg Oral TID PRN Bobbitt, Shalon E, NP   5 mg at 07/28/24 0806   ondansetron  (ZOFRAN ) tablet 4 mg  4 mg Oral Q8H PRN Bobbitt, Shalon E, NP   4 mg at 07/11/24 1835   Oxcarbazepine  (TRILEPTAL ) tablet 300 mg  300 mg Oral BID Jaicey Sweaney, MD   300 mg at 07/28/24 2119   potassium chloride  SA (KLOR-CON  M) CR tablet 20 mEq  20 mEq Oral BID Bobbitt, Shalon E, NP   20 mEq at 07/28/24 2119   rosuvastatin  (CRESTOR ) tablet 5 mg  5 mg Oral Daily Bobbitt, Shalon E, NP   5 mg at 07/28/24 9077   traZODone  (DESYREL ) tablet 50 mg  50 mg Oral QHS PRN Bobbitt, Shalon E, NP   50 mg at 07/26/24 2141   zolpidem  (AMBIEN ) tablet 10 mg  10 mg Oral QHS Torez Beauregard, MD   10 mg at 07/28/24 2119    Lab Results:  Results for orders placed or performed during the hospital encounter of 06/19/24 (from the past 48 hours)  CBC with Differential/Platelet     Status: Abnormal   Collection Time: 07/27/24  6:41 AM  Result Value Ref Range   WBC 8.1 4.0 - 10.5 K/uL   RBC 4.20 3.87 - 5.11 MIL/uL   Hemoglobin 11.5 (L) 12.0 - 15.0 g/dL   HCT 63.0 63.9 - 53.9 %   MCV 87.9 80.0 - 100.0 fL   MCH 27.4 26.0 - 34.0 pg   MCHC 31.2 30.0 - 36.0 g/dL   RDW 86.3 88.4 - 84.4 %   Platelets 378 150 - 400 K/uL    nRBC 0.0 0.0 - 0.2 %   Neutrophils Relative % 42 %   Neutro Abs 3.4 1.7 - 7.7 K/uL   Lymphocytes Relative 44 %   Lymphs Abs 3.5 0.7 - 4.0 K/uL   Monocytes Relative 9 %   Monocytes Absolute 0.8 0.1 - 1.0 K/uL   Eosinophils Relative 4 %   Eosinophils Absolute 0.4 0.0 - 0.5 K/uL   Basophils Relative 1 %   Basophils Absolute 0.1 0.0 - 0.1 K/uL   Immature Granulocytes 0 %   Abs Immature Granulocytes 0.03 0.00 - 0.07 K/uL    Comment: Performed at [facility], [address] [place], [place] [address]  Glucose, capillary     Status: Abnormal   Collection Time: 07/27/24  7:17 AM  Result Value Ref Range   Glucose-Capillary 173 (H) 70 - 99 mg/dL    Comment: Glucose reference range applies only to samples taken after fasting for at least 8 hours.  Glucose, capillary     Status: Abnormal   Collection Time: 07/28/24  7:18 AM  Result Value Ref Range   Glucose-Capillary 107 (H) 70 - 99 mg/dL    Comment: Glucose reference range applies only to samples taken after fasting for at least 8 hours.        Blood Alcohol level:  Lab Results  Component Value Date   [facility] <15 06/18/2024    Metabolic Disorder Labs: Lab Results  Component Value Date   HGBA1C 5.9 (H) 06/28/2024  MPG 122.63 06/28/2024   No results found for: PROLACTIN Lab Results  Component Value Date   CHOL 156 06/28/2024   TRIG 89 06/28/2024   HDL 70 06/28/2024   CHOLHDL 2.2 06/28/2024   VLDL 18 06/28/2024   LDLCALC 69 06/28/2024    Physical Findings: AIMS:  , ,  ,  ,    CIWA:    COWS:      Psychiatric Specialty Exam:  Presentation  General Appearance:  Casual  Eye Contact: Fleeting  Speech: Normal Rate  Speech Volume: Increased    Mood and Affect  Mood: Euphoric  Affect: stable  Thought Process  Thought Processes: Improving illogical  Orientation:Full (Time, Place and Person)  Thought Content:Illogical; Delusions; Tangential  Hallucinations: Denies  Ideas of  Reference:Delusions  Suicidal Thoughts: Denies  Homicidal Thoughts: Denies   Sensorium  Memory: Immediate Fair; Remote Fair  Judgment: Impaired  Insight: Shallow   Executive Functions  Concentration: Poor  Attention Span: Poor  Recall: [employer] of Knowledge: Fair  Language: Fair   Psychomotor Activity  Psychomotor Activity: No data recorded  Musculoskeletal: Strength & Muscle Tone: within normal limits Gait & Station: normal Assets  Assets: Manufacturing Systems Engineer; Desire for Improvement    Physical Exam: Physical Exam Vitals and nursing note reviewed.    ROS Blood pressure (!) 102/54, pulse (!) 109, temperature (!) 97.3 F (36.3 C), resp. rate 16, height 5' 5 (1.651 m), weight 81 kg, SpO2 100%. Body mass index is 29.7 kg/m.  Diagnosis: Principal Problem:   Bilateral lower extremity edema Active Problems:   Hypercholesteremia   Schizoaffective disorder, bipolar type (HCC)   Chest pain   Sinus tachycardia   Hypotension   Controlled type 2 diabetes mellitus without complication, without long-term current use of insulin (HCC)   Hyponatremia  Schizoaffective disorder bipolar type  Clinical Decision Making: Patient is currently admitted for worsening psychosis, manic symptoms, tangential grandiosity.  She needs inpatient hospitalization for medication management and close monitoring.  Patient is unable to make decisions on her own as she is unable to appreciate and understand her mental health history, the current need for treatment.  [facility] has screened her case and for further evaluation for legal guardianship. [facility] screened in her case. Sister and mom are involved in her care.  Treatment Plan Summary:  Safety and Monitoring:             -- inoluntary admission to inpatient psychiatric unit for safety, stabilization and treatment             -- Daily contact with patient to assess and evaluate symptoms and progress in treatment             --  Patient's case to be discussed in multi-disciplinary team meeting             -- Observation Level: q15 minute checks             -- Vital signs:  q12 hours             -- Precautions: suicide, elopement, and assault   2. Psychiatric Diagnoses and Treatment:  Discontinue Depakote  as patient is noncompliant Discontinue Geodon  20 mg nightly as poor response Increased Clozaril  125 mg twice daily enforced with zyprexa  10mg  BID- Cymbalta  40 mg daily-discontinue to minimize manic Trilepta 300 mg twice daily Abilify  10 mg is added -- The risks/benefits/side-effects/alternatives to this medication were discussed in detail with the patient and time was given for questions. The patient consents  to medication trial.                -- Metabolic profile and EKG monitoring obtained while on an atypical antipsychotic (BMI: Lipid Panel: HbgA1c: QTc:)              -- Encouraged patient to participate in unit milieu and in scheduled group therapies                            3. Medical Issues Being Addressed:   Chest pain Sinus tachycardia HTN (hypertension) Bilateral lower extremity edema Hypercholesteremia  12/30:  Troponin negative x 2. Echocardiogram done with pending results.  Small improvement in lower extremity edema so continuing p.o. Lasix .  Will need BMP every other day.  4. Discharge Planning:   -- Social work and case management to assist with discharge planning and identification of hospital follow-up needs prior to discharge  -- Estimated LOS: 3-4 days  Kayd Launer, MD 07/28/2024, 10:15 PM

## 2024-07-28 NOTE — Group Note (Signed)
 Date:  07/28/2024 Time:  8:45 PM  Group Topic/Focus:  Wrap-Up Group:   The focus of this group is to help patients review their daily goal of treatment and discuss progress on daily workbooks.    Participation Level:  Active  Participation Quality:  Appropriate  Affect:  Appropriate  Cognitive:  Appropriate  Insight: Appropriate  Engagement in Group:  Engaged  Modes of Intervention:  Discussion  Additional Comments:    Beatris ONEIDA Hasten 07/28/2024, 8:45 PM

## 2024-07-28 NOTE — Group Note (Signed)
 Recreation Therapy Group Note   Group Topic:Emotion Expression  Group Date: 07/28/2024 Start Time: 1400 End Time: 1445 Facilitators: Celestia Jeoffrey BRAVO, LRT, CTRS Location: Dayroom  Group Description: Painting a Peaceful Place. Patients and LRT discuss what it means to be at peace, what it feels like physically and mentally. Pts are given a canvas and watercolor paint to use and encouraged to draw their idea of a peaceful place. Pts and LRT discuss how they use this in their daily life post discharge. Pts are encouraged to take their canvas home with them as a reminder to find their peaceful place whenever they are feeling depressed, anxious, etc.    Goal Area(s) Addressed:  Patient will identify what it means to experience a peaceful emotion. Patient will identify a new coping skill.  Patient will express their emotions through art. Patients will increase communication by talking with LRT and peers while in group.   Affect/Mood: Appropriate   Participation Level: Moderate    Clinical Observations/Individualized Feedback: Valerie [name] was present for half of group. Pt shared peace, love and happiness as her peaceful place.   Plan: Continue to engage patient in RT group sessions 2-3x/week.   Jeoffrey BRAVO Celestia, LRT, CTRS 07/28/2024 4:37 PM

## 2024-07-28 NOTE — Progress Notes (Signed)
" °   07/27/24 2246  Psych Admission Type (Psych Patients Only)  Admission Status Involuntary  Psychosocial Assessment  Patient Complaints None  Eye Contact Fair  Facial Expression Animated  Affect Labile  Speech Tangential  Interaction Assertive  Motor Activity Restless  Appearance/Hygiene In scrubs  Behavior Characteristics Impulsive  Mood Labile  Thought Process  Coherency Disorganized  Content Preoccupation  Delusions Grandeur  Perception Derealization  Hallucination None reported or observed  Judgment Impaired  Confusion Mild  Danger to Self  Current suicidal ideation? Denies  Agreement Not to Harm Self Yes  Description of Agreement verbal  Danger to Others  Danger to Others None reported or observed    Estimated Sleeping Duration (Last 24 Hours): 1.75-2.75 hours  "

## 2024-07-28 NOTE — Progress Notes (Signed)
" °   07/28/24 1700  Psych Admission Type (Psych Patients Only)  Admission Status Involuntary  Psychosocial Assessment  Patient Complaints Agitation;Anxiety;Crying spells  Eye Contact Fair  Facial Expression Animated  Affect Fearful  Speech Pressured  Interaction Assertive  Motor Activity Restless  Appearance/Hygiene In scrubs  Behavior Characteristics Impulsive  Mood Labile;Terrified;Preoccupied  Thought Process  Coherency Disorganized  Content Preoccupation  Delusions Grandeur;Paranoid;Religious  Perception Hallucinations  Hallucination Auditory  Judgment Impaired  Confusion Mild  Danger to Self  Current suicidal ideation?  (denies)  Danger to Others  Danger to Others None reported or observed    "

## 2024-07-28 NOTE — Plan of Care (Signed)
" °  Problem: Education: Goal: Knowledge of [facility] Education information/materials will improve Outcome: Progressing Goal: Emotional status will improve Outcome: Progressing Goal: Mental status will improve Outcome: Progressing Goal: Verbalization of understanding the information provided will improve Outcome: Progressing   Problem: Activity: Goal: Interest or engagement in activities will improve Outcome: Progressing Goal: Sleeping patterns will improve Outcome: Progressing   Problem: Coping: Goal: Ability to verbalize frustrations and anger appropriately will improve Outcome: Progressing Goal: Ability to demonstrate self-control will improve Outcome: Progressing   Problem: Health Behavior/Discharge Planning: Goal: Identification of resources available to assist in meeting health care needs will improve Outcome: Progressing Goal: Compliance with treatment plan for underlying cause of condition will improve Outcome: Progressing   Problem: Physical Regulation: Goal: Ability to maintain clinical measurements within normal limits will improve Outcome: Progressing   Problem: Safety: Goal: Periods of time without injury will increase Outcome: Progressing   Problem: Education: Goal: Ability to state activities that reduce stress will improve Outcome: Progressing   Problem: Self-Concept: Goal: Ability to identify factors that promote anxiety will improve Outcome: Progressing Goal: Level of anxiety will decrease Outcome: Progressing Goal: Ability to modify response to factors that promote anxiety will improve Outcome: Progressing   Problem: Activity: Goal: Will verbalize the importance of balancing activity with adequate rest periods Outcome: Progressing   "

## 2024-07-28 NOTE — Group Note (Signed)
 Physical/Occupational Therapy Group Note  Group Topic: Pain Management and Coping   Group Date: 07/28/2024 Start Time: 1300 End Time: 1345 Facilitators: Clive Warren CROME, OT   Group Description: Group discussed impact of chronic/acute pain on safety and independence with functional tasks and impact on mental health.  Identified and discussed any previously learned or implemented strategies used.  Discussed and reviewed cognitive behavioral pain coping strategies to address/improve overall management of pain. Discussed relaxation, distraction techniques, cognitive restructuring, activity pacing/energy conservation, environment/home safety modifications, and role of sleep and sleep hygiene. Allowed time for questions and further discussion.  Therapeutic Goal(s):  Identify and discuss previously utilized pain coping strategies and implications of pain on function/well-being Identify and discuss implementing new cognitive behavioral pain coping strategies into daily routines Demonstrate understanding and performance of learned cognitive behavioral pain coping strategies  Individual Participation: Pt participatory for at least 2/3rd of session. Left and came back at least once. Intermittently disruptive and requesting to continue with session despite group being engaged in deeper discussion on a specific topic. Able to identify strategies herself but seemed impatient. Took a couple notes on the handout and appreciative of instruction.   Participation Level: Moderate   Participation Quality: Minimal Cues   Behavior: Alert, Disruptive, Eager, Interactive , and Restless   Speech/Thought Process: Directed, Loud, and Relevant   Affect/Mood: Irritable   Insight: Poor   Judgement: Poor   Modes of Intervention: Activity, Clarification, Confrontation, Discussion, Education, Exploration, Problem-solving, Rapport Building, Socialization, and Support  Patient Response to Interventions:  Attentive,  Challenging , and Engaged   Plan: Continue to engage patient in PT/OT groups 1 - 2x/week.  Trystan Eads R., MPH, MS, OTR/L ascom [phone] 07/28/24, 4:12 PM

## 2024-07-28 NOTE — BHH Counselor (Signed)
 CSW attempted to contact patient's sister, Kyoko Elsea, [phone].  CSW unable to leave a voicemail message as voicemail box is not set up.   Sherryle Margo, MSW, LCSW 07/28/2024 3:31 PM

## 2024-07-28 NOTE — Group Note (Deleted)
 Date:  07/28/2024 Time:  8:41 PM  Group Topic/Focus:  Wrap-Up Group:   The focus of this group is to help patients review their daily goal of treatment and discuss progress on daily workbooks.     Participation Level:  {BHH PARTICIPATION OZCZO:77735}  Participation Quality:  {BHH PARTICIPATION QUALITY:22265}  Affect:  {BHH AFFECT:22266}  Cognitive:  {BHH COGNITIVE:22267}  Insight: {BHH Insight2:20797}  Engagement in Group:  {BHH ENGAGEMENT IN HMNLE:77731}  Modes of Intervention:  {BHH MODES OF INTERVENTION:22269}  Additional Comments:  ***  Nolyn Eilert T Deidrea Gaetz 07/28/2024, 8:41 PM

## 2024-07-28 NOTE — Plan of Care (Signed)
   Problem: Education: Goal: Verbalization of understanding the information provided will improve Outcome: Progressing   Problem: Activity: Goal: Sleeping patterns will improve Outcome: Progressing

## 2024-07-28 NOTE — Progress Notes (Addendum)
 Patient presents with severe agitation by hitting walls, having several outbursts, and using intimating language. One time order of Thorazine  50 mg adm IM in L deltoid at 1150. Patient appeared to tolerate well.

## 2024-07-29 DIAGNOSIS — F250 Schizoaffective disorder, bipolar type: Secondary | ICD-10-CM | POA: Diagnosis not present

## 2024-07-29 MED ORDER — ALUM & MAG HYDROXIDE-SIMETH 200-200-20 MG/5ML PO SUSP
30.0000 mL | Freq: Once | ORAL | Status: AC
  Administered 2024-07-29: 30 mL via ORAL
  Filled 2024-07-29: qty 30

## 2024-07-29 NOTE — Progress Notes (Signed)
" °   07/29/24 2051  Psych Admission Type (Psych Patients Only)  Admission Status Involuntary  Psychosocial Assessment  Patient Complaints Anxiety  Eye Contact Fair  Facial Expression Animated  Affect Preoccupied;Anxious;Labile  Speech Tangential;Loud  Interaction Assertive  Motor Activity Slow  Appearance/Hygiene In scrubs  Behavior Characteristics Anxious;Hyperactive  Mood Labile;Apprehensive;Preoccupied  Thought Process  Coherency Disorganized  Content Preoccupation  Delusions Grandeur;Paranoid;Religious  Perception Hallucinations  Hallucination Auditory  Judgment Impaired  Confusion Mild  Danger to Self  Current suicidal ideation? Denies  Agreement Not to Harm Self Yes  Description of Agreement verbal  Danger to Others  Danger to Others None reported or observed   Mood/Behavior: Labile. Pt hyperactive interacting speaking to everyone. Pt stated to writer I am ready to take my meds now. Pt informed medication pass will began at 2100. About 10 minutes later pt noted to yell out of her room door you can come now with my meds. Pt stating I do not need Clozaril . They are trying to drug me it makes me tired and I can barely talk. Pt also expressing she just wants to go home with her mom. POC discussed with pt no further concerns at this time.   Psych assessment: Denies SI/HI and AVH.     Interaction / Group attendance:  Present in the milieu. Hyperactive and interacting with peers and staff.  Pt speaking to all her peers stating I hope you have a good night rest. Attended group. Pt sat in front of nurses station and made comments like Avie is my cousin. I am famous. I am the old Shaunika now. It is time to do away with all this psychological stuff. Pt giggling to her self. Noted to make jokes then laugh at them.    Medication/ PRNs: Compliant with scheduled medications. Pt required prn Atarax  for anxiety and noted to have decrease effect.  Pt required one time dose of Maalox  for indigestion.   Pain: Denies  15 min checks in place for safety. "

## 2024-07-29 NOTE — Group Note (Signed)
 Date:  07/29/2024 Time:  9:44 AM  Group Topic/Focus:  Coping With Mental Health Crisis:   The purpose of this group is to help patients identify strategies for coping with mental health crisis.  Group discusses possible causes of crisis and ways to manage them effectively.    Participation Level:  Did Not Attend    [name] 07/29/2024, 9:44 AM

## 2024-07-29 NOTE — Group Note (Signed)
 Date:  07/29/2024 Time:  10:12 PM  Group Topic/Focus:  Self Care:   The focus of this group is to help patients understand the importance of self-care in order to improve or restore emotional, physical, spiritual, interpersonal, and financial health.    Participation Level:  Active  Participation Quality:  Appropriate  Affect:  Appropriate  Cognitive:  Appropriate  Insight: Good  Engagement in Group:  Engaged  Modes of Intervention:  Discussion  Additional Comments:    Valerie [name] 07/29/2024, 10:12 PM

## 2024-07-29 NOTE — B[facility] Counselor (Signed)
 CSW spoke with pt's sister Clotiel Troop, [phone].  Betsy Rosello reports that she and her mother Hargis Ina [phone] has a court date for guardianship for pt on February 12th at 9:30am in [place].  Holleigh Crihfield would like for pt's physician to contact pt's sister or mother on Monday, January 19th.

## 2024-07-29 NOTE — [facility] Suicide Risk Assessment (Signed)
 [facility] INPATIENT:  Family/Significant Other Suicide Prevention Education  Suicide Prevention Education:  Education Completed; Valerie [name] [phone] , sister, has been identified by the patient as the family member/significant other with whom the patient will be residing, and identified as the person(s) who will aid the patient in the event of a mental health crisis (suicidal ideations/suicide attempt).  With written consent from the patient, the family member/significant other has been provided the following suicide prevention education, prior to the and/or following the discharge of the patient.  The suicide prevention education provided includes the following: Suicide risk factors Suicide prevention and interventions National Suicide Hotline telephone number [facility] assessment telephone number [facility] Emergency Assistance 911 [facility] and/or Residential Mobile Crisis Unit telephone number  Request made of family/significant other to: Remove weapons (e.g., guns, rifles, knives), all items previously/currently identified as safety concern.   Remove drugs/medications (over-the-counter, prescriptions, illicit drugs), all items previously/currently identified as a safety concern.  The family member/significant other verbalizes understanding of the suicide prevention education information provided.  The family member/significant other agrees to remove the items of safety concern listed above.  The CSW contacted the patient's sister to provide SPE.  The patient's sister reported that the patient does not have any guns or weapons at the [facility]isted [facility] in [place].  Patient's sister stated that the patient speaks with her and their mother daily sometimes 4 or 5 times a day.  The patient's sister and mother has requested that the doctor contact them concerning earlier visitation with patient due to them not able to drive at night and also how patient is.  Valerie [name] 07/29/2024, 3:55 PM

## 2024-07-29 NOTE — Group Note (Signed)
 Date:  07/29/2024 Time:  5:00 PM  Group Topic/Focus:  Wellness Toolbox:   The focus of this group is to discuss various aspects of wellness, balancing those aspects and exploring ways to increase the ability to experience wellness.  Patients will create a wellness toolbox for use upon discharge.    Participation Level:  Active  Participation Quality:  Appropriate and Attentive  Affect:  Appropriate, Blunted, and Excited  Cognitive:  Alert and Confused  Insight: Limited  Engagement in Group:  Engaged, Monopolizing, and Off Topic  Modes of Intervention:  Activity and Discussion  Additional Comments:     Maglione,Goro Wenrick E 07/29/2024, 5:00 PM

## 2024-07-29 NOTE — Progress Notes (Signed)
 [facility] MD Progress Note   1:50 PM Valerie [name]  MRN:  [id number]   Valerie [name] is a 64 year old female presenting voluntary to [facility] requesting a psychiatric evaluation for worsening psychosis. Patient has history of schizophrenia. Patient denied SI, HI, psychosis and alcohol/drug usage. Patient resides at [facility]. Patient reports people are coming by my door talking loud, yelling and screaming and coming in my room with keys. Patient reports having multiple medical Afibs and blood clots due to this. Patient reports people are doing things in the dining room. When asked if she called EMS, patient stated I called EMS, I stripped searched over the phone and called the authorities. Patient is admitted to [facility] unit with Q15 min safety monitoring. Multidisciplinary team approach is offered. Medication management; group/milieu therapy is offered.   [name] : [phone]- called on 07/28/24  to update the med changes but went to generic voicemail Subjective:  Chart reviewed, case discussed in multidisciplinary meeting, patient seen during rounds.   Patient remains very labile, intrusive, approaching patients and staff members inappropriately.  Patient is redirected by this provider multiple times throughout the day as she calls this provider as her cousin/sister and demands to talk to her every time she sees the provider on the unit.  As patient was getting loud, screaming for no apparent reason, running out of her room saying she is a danger to herself-as needed Thorazine  IM has been administered.  Patient is not responding at all to Clozaril  in spite of increased dose and also poor response to Geodon  which was discontinued and started on Abilify  which was titrated to 20 mg today.  If patient remains manic/psychotic we will discuss a plan to switch Clozaril  to loxapine as patient has documented allergy to Risperdal/paliperidone. Past Psychiatric History:  see h&P Family History:  Family History  Problem Relation Age of Onset   Colon cancer Neg Hx    Celiac disease Neg Hx    Inflammatory bowel disease Neg Hx    Social History:  Social History   Substance and Sexual Activity  Alcohol Use Never     Social History   Substance and Sexual Activity  Drug Use Never    Social History   Socioeconomic History   Marital status: Single    Spouse name: Not on file   Number of children: Not on file   Years of education: Not on file   Highest education level: Not on file  Occupational History   Not on file  Tobacco Use   Smoking status: Never   Smokeless tobacco: Never  Substance and Sexual Activity   Alcohol use: Never   Drug use: Never   Sexual activity: Not Currently    Birth control/protection: Pill  Other Topics Concern   Not on file  Social History Narrative   Not on file   Social Drivers of Health   Tobacco Use: Low Risk (06/19/2024)   Patient History    Smoking Tobacco Use: Never    Smokeless Tobacco Use: Never    Passive Exposure: Not on file  Financial Resource Strain: Not on file  Food Insecurity: Unknown (06/19/2024)   Epic    Worried About Programme Researcher, Broadcasting/film/video in the Last Year: Never true    [employer] of Food in the Last Year: Patient declined  Transportation Needs: No Transportation Needs (06/19/2024)   Epic    Lack of Transportation (Medical): No    Lack of Transportation (Non-Medical):  No  Physical Activity: Not on file  Stress: Not on file  Social Connections: Not on file  Depression (PHQ2-9): Low Risk (12/21/2022)   Depression (PHQ2-9)    PHQ-2 Score: 0  Alcohol Screen: Low Risk (06/19/2024)   Alcohol Screen    Last Alcohol Screening Score (AUDIT): 0  Housing: Low Risk (06/19/2024)   Epic    Unable to Pay for Housing in the Last Year: No    Number of Times Moved in the Last Year: 0    Homeless in the Last Year: No  Utilities: Not At Risk (06/19/2024)   Epic    Threatened with loss of utilities: No   Health Literacy: Not on file   Past Medical History:  Past Medical History:  Diagnosis Date   Chronic lower extremity pain (2ry area of Pain) (Right) 12/21/2022   Chronic pain syndrome 12/20/2022   DDD (degenerative disc disease), 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 Tobie: Pancolonic diverticulosis, tortuous colon, next colonoscopy 2024   TONSILLECTOMY      Current Medications: Current Facility-Administered Medications  Medication Dose Route Frequency Provider Last Rate Last Admin   acetaminophen  (TYLENOL ) tablet 650 mg  650 mg Oral Q6H PRN Onuoha, Chinwendu V, NP   650 mg at 07/11/24 1835   ARIPiprazole  (ABILIFY ) tablet 20 mg  20 mg Oral Daily Jadapalle, Sree, MD   20 mg at 07/29/24 9074   aspirin  EC tablet 81 mg  81 mg Oral Daily Bobbitt, Shalon E, NP   81 mg at 07/29/24 9074   cholecalciferol  (VITAMIN D3) 25 MCG (1000 UNIT) tablet 1,000 Units  1,000 Units Oral Weekly Jadapalle, Sree, MD   1,000 Units at 07/25/24 1230   cloZAPine  (CLOZARIL ) tablet 150 mg  150 mg Oral BID Jadapalle, Sree, MD   150 mg at 07/29/24 9075   Or   OLANZapine  (ZYPREXA ) injection 10 mg  10 mg Intramuscular BID Jadapalle, Sree, MD       dapagliflozin  propanediol (FARXIGA ) tablet 10 mg  10 mg Oral Daily Josette Ade, MD   10 mg at 07/29/24 9075   diclofenac  Sodium (VOLTAREN ) 1 % topical gel 4 g  4 g Topical QID PRN Django Nguyen R, MD       famotidine  (PEPCID ) tablet 40 mg  40 mg Oral Daily Shrivastava, Aryendra, MD   40 mg at 07/29/24 9075   fluticasone  (FLONASE ) 50 MCG/ACT nasal spray 1 spray  1 spray Each Nare Daily PRN Wrigley Plasencia R, MD       furosemide  (LASIX ) tablet 40 mg  40 mg Oral 2 times per day Josette Ade, MD   40 mg at 07/29/24 9075   gabapentin  (NEURONTIN ) capsule 300 mg  300 mg Oral TID Bobbitt, Shalon E, NP   300 mg at 07/29/24  9074   hydrOXYzine  (ATARAX ) tablet 25 mg  25 mg Oral TID PRN Bobbitt, Shalon E, NP   25 mg at 07/28/24 2119   magnesium  hydroxide (MILK OF MAGNESIA) suspension 30 mL  30 mL Oral Daily PRN Bobbitt, Shalon E, NP       metoprolol  succinate (TOPROL -XL) 24 hr tablet 25 mg  25 mg Oral BID Josette Ade, MD   25 mg at 07/27/24 2058   multivitamin with minerals tablet 1 tablet  1 tablet Oral Daily Bobbitt, Shalon E, NP  1 tablet at 07/29/24 0925   naproxen  (NAPROSYN ) tablet 375 mg  375 mg Oral TID WC Jadapalle, Sree, MD   375 mg at 07/29/24 0925   OLANZapine  (ZYPREXA ) injection 5 mg  5 mg Intramuscular TID PRN Bobbitt, Shalon E, NP   5 mg at 06/22/24 2259   OLANZapine  zydis (ZYPREXA ) disintegrating tablet 5 mg  5 mg Oral TID PRN Bobbitt, Shalon E, NP   5 mg at 07/28/24 0806   ondansetron  (ZOFRAN ) tablet 4 mg  4 mg Oral Q8H PRN Bobbitt, Shalon E, NP   4 mg at 07/11/24 1835   Oxcarbazepine  (TRILEPTAL ) tablet 300 mg  300 mg Oral BID Jadapalle, Sree, MD   300 mg at 07/29/24 9075   potassium chloride  SA (KLOR-CON  M) CR tablet 20 mEq  20 mEq Oral BID Bobbitt, Shalon E, NP   20 mEq at 07/29/24 9074   rosuvastatin  (CRESTOR ) tablet 5 mg  5 mg Oral Daily Bobbitt, Shalon E, NP   5 mg at 07/29/24 9074   traZODone  (DESYREL ) tablet 50 mg  50 mg Oral QHS PRN Bobbitt, Shalon E, NP   50 mg at 07/26/24 2141   zolpidem  (AMBIEN ) tablet 10 mg  10 mg Oral QHS Jadapalle, Sree, MD   10 mg at 07/28/24 2119    Lab Results:  Results for orders placed or performed during the hospital encounter of 06/19/24 (from the past 48 hours)  Glucose, capillary     Status: Abnormal   Collection Time: 07/28/24  7:18 AM  Result Value Ref Range   Glucose-Capillary 107 (H) 70 - 99 mg/dL    Comment: Glucose reference range applies only to samples taken after fasting for at least 8 hours.        Blood Alcohol level:  Lab Results  Component Value Date   [facility] <15 06/18/2024    Metabolic Disorder Labs: Lab Results  Component Value  Date   HGBA1C 5.9 (H) 06/28/2024   MPG 122.63 06/28/2024   No results found for: PROLACTIN Lab Results  Component Value Date   CHOL 156 06/28/2024   TRIG 89 06/28/2024   HDL 70 06/28/2024   CHOLHDL 2.2 06/28/2024   VLDL 18 06/28/2024   LDLCALC 69 06/28/2024    Physical Findings: AIMS:  , ,  ,  ,    CIWA:    COWS:      Psychiatric Specialty Exam:  Presentation  General Appearance:  Casual  Eye Contact: Fleeting  Speech: Normal Rate  Speech Volume: Increased    Mood and Affect  Mood: Euphoric  Affect: stable  Thought Process  Thought Processes: Improving illogical  Orientation:Full (Time, Place and Person)  Thought Content:Illogical; Delusions; Tangential  Hallucinations: Denies  Ideas of Reference:Delusions  Suicidal Thoughts: Denies  Homicidal Thoughts: Denies   Sensorium  Memory: Immediate Fair; Remote Fair  Judgment: Impaired  Insight: Shallow   Executive Functions  Concentration: Poor  Attention Span: Poor  Recall: [employer] of Knowledge: Fair  Language: Fair   Psychomotor Activity  Psychomotor Activity: No data recorded  Musculoskeletal: Strength & Muscle Tone: within normal limits Gait & Station: normal Assets  Assets: Manufacturing Systems Engineer; Desire for Improvement    Physical Exam: Physical Exam Vitals and nursing note reviewed.    ROS Blood pressure (!) 102/54, pulse (!) 109, temperature (!) 97.3 F (36.3 C), resp. rate 16, height 5' 5 (1.651 m), weight 81 kg, SpO2 100%. Body mass index is 29.7 kg/m.  Diagnosis: Principal Problem:   Bilateral lower  extremity edema Active Problems:   Hypercholesteremia   Schizoaffective disorder, bipolar type (HCC)   Chest pain   Sinus tachycardia   Hypotension   Controlled type 2 diabetes mellitus without complication, without long-term current use of insulin (HCC)   Hyponatremia  Schizoaffective disorder bipolar type  Clinical Decision Making:  Patient is currently admitted for worsening psychosis, manic symptoms, tangential grandiosity.  She needs inpatient hospitalization for medication management and close monitoring.  Patient is unable to make decisions on her own as she is unable to appreciate and understand her mental health history, the current need for treatment.  [facility] has screened her case and for further evaluation for legal guardianship. [facility] screened in her case. Sister and mom are involved in her care.  Treatment Plan Summary:  Safety and Monitoring:             -- inoluntary admission to inpatient psychiatric unit for safety, stabilization and treatment             -- Daily contact with patient to assess and evaluate symptoms and progress in treatment             -- Patient's case to be discussed in multi-disciplinary team meeting             -- Observation Level: q15 minute checks             -- Vital signs:  q12 hours             -- Precautions: suicide, elopement, and assault   2. Psychiatric Diagnoses and Treatment:  Discontinue Depakote  as patient is noncompliant Discontinue Geodon  20 mg nightly as poor response Increased Clozaril  125 mg twice daily enforced with zyprexa  10mg  BID- Cymbalta  40 mg daily-discontinue to minimize manic Trilepta 300 mg twice daily Abilify  10 mg is added -- The risks/benefits/side-effects/alternatives to this medication were discussed in detail with the patient and time was given for questions. The patient consents to medication trial.                -- Metabolic profile and EKG monitoring obtained while on an atypical antipsychotic (BMI: Lipid Panel: HbgA1c: QTc:)              -- Encouraged patient to participate in unit milieu and in scheduled group therapies                            3. Medical Issues Being Addressed:   Chest pain Sinus tachycardia HTN (hypertension) Bilateral lower extremity edema Hypercholesteremia  12/30:  Troponin negative x 2. Echocardiogram done with  pending results.  Small improvement in lower extremity edema so continuing p.o. Lasix .  Will need BMP every other day.  4. Discharge Planning:   -- Social work and case management to assist with discharge planning and identification of hospital follow-up needs prior to discharge  -- Estimated LOS: 3-4 days  Valerie JONELLE Manners, MD 07/29/2024, 1:50 PM

## 2024-07-29 NOTE — Plan of Care (Signed)
" °  Problem: Education: Goal: Knowledge of [facility] Education information/materials will improve Outcome: Progressing Goal: Emotional status will improve Outcome: Progressing Goal: Mental status will improve Outcome: Progressing Goal: Verbalization of understanding the information provided will improve Outcome: Progressing   Problem: Activity: Goal: Interest or engagement in activities will improve Outcome: Progressing Goal: Sleeping patterns will improve Outcome: Progressing   Problem: Coping: Goal: Ability to verbalize frustrations and anger appropriately will improve Outcome: Progressing Goal: Ability to demonstrate self-control will improve Outcome: Progressing   Problem: Health Behavior/Discharge Planning: Goal: Identification of resources available to assist in meeting health care needs will improve Outcome: Progressing Goal: Compliance with treatment plan for underlying cause of condition will improve Outcome: Progressing   Problem: Physical Regulation: Goal: Ability to maintain clinical measurements within normal limits will improve Outcome: Progressing   Problem: Safety: Goal: Periods of time without injury will increase Outcome: Progressing   Problem: Education: Goal: Ability to state activities that reduce stress will improve Outcome: Progressing   Problem: Self-Concept: Goal: Ability to identify factors that promote anxiety will improve Outcome: Progressing Goal: Level of anxiety will decrease Outcome: Progressing Goal: Ability to modify response to factors that promote anxiety will improve Outcome: Progressing   Problem: Activity: Goal: Will verbalize the importance of balancing activity with adequate rest periods Outcome: Progressing   "

## 2024-07-29 NOTE — Plan of Care (Signed)
  Problem: Self-Concept: Goal: Level of anxiety will decrease Outcome: Not Progressing Goal: Ability to modify response to factors that promote anxiety will improve Outcome: Not Progressing

## 2024-07-29 NOTE — Progress Notes (Signed)
" °   07/29/24 1500  Psych Admission Type (Psych Patients Only)  Admission Status Involuntary  Psychosocial Assessment  Patient Complaints Agitation;Anxiety;Restlessness;Nervousness  Eye Contact Fair  Facial Expression Animated  Affect Fearful  Speech Pressured  Interaction Assertive  Motor Activity Restless  Appearance/Hygiene In scrubs  Behavior Characteristics Impulsive;Hyperactive  Mood Labile;Preoccupied;Apprehensive  Thought Process  Coherency Disorganized  Content Preoccupation  Delusions Grandeur;Paranoid;Religious  Perception Hallucinations  Hallucination Auditory  Judgment Impaired  Confusion Mild  Danger to Self  Current suicidal ideation? Denies (denies)  Danger to Others  Danger to Others None reported or observed    "

## 2024-07-29 NOTE — Group Note (Signed)
 [facility] LCSW Group Therapy Note   Group Date: 07/29/2024 Start Time: 1405 End Time: 1435   Type of Therapy/Topic:  Group Therapy:  Balance in Life  Participation Level:  Active   Description of Group:    This group will address the concept of balance and how it feels and looks when one is unbalanced. Patients will be encouraged to process areas in their lives that are out of balance, and identify reasons for remaining unbalanced. Facilitators will guide patients utilizing problem- solving interventions to address and correct the stressor making their life unbalanced. Understanding and applying boundaries will be explored and addressed for obtaining  and maintaining a balanced life. Patients will be encouraged to explore ways to assertively make their unbalanced needs known to significant others in their lives, using other group members and facilitator for support and feedback.  Therapeutic Goals: Patient will identify two or more emotions or situations they have that consume much of in their lives. Patient will identify signs/triggers that life has become out of balance:  Patient will identify two ways to set boundaries in order to achieve balance in their lives:  Patient will demonstrate ability to communicate their needs through discussion and/or role plays  Summary of Patient Progress: The facilitator and patients discussed 7 steps to finding balance in life. Group members chose 3 affirmation stickers that they focused on to bring positivity to every situation or challenge.  The patient reflected on their own personal thoughts and feelings about themselves and explained why they chose that particular affirmation.  The patient was receptive to feedback from both peers and the facilitator and contributed to creating a supportive environment, encouraging others to open up and share.   Therapeutic Modalities:   Cognitive Behavioral Therapy Solution-Focused Therapy Assertiveness  Training   Rexene LELON Mae, LCSWA

## 2024-07-30 DIAGNOSIS — F250 Schizoaffective disorder, bipolar type: Secondary | ICD-10-CM | POA: Diagnosis not present

## 2024-07-30 NOTE — Progress Notes (Signed)
 Patient ID: Valerie [name], female   DOB: 09-16-1960, 64 y.o.   MRN: [id number]  1500 Pt A/O x4 with noted confusion. Pt C/O of pain; The pain in the middle of my chest. V/S taken. BP: 116/60; P: 80, R: 18; T: 97.4; O2 Sat: 99. Pt given fluids. Monitoring continues during 7a-7p shift. Pt in her room on the side of the bed reading. Equal rise and fall of chest.

## 2024-07-30 NOTE — Progress Notes (Signed)
 [facility] MD Progress Note   1:09 PM Valerie [name]  MRN:  [id number]   Valerie [name] is a 64 year old female presenting voluntary to [facility] requesting a psychiatric evaluation for worsening psychosis. Patient has history of schizophrenia. Patient denied SI, HI, psychosis and alcohol/drug usage. Patient resides at [facility] Patient reports people are coming by my door talking loud, yelling and screaming and coming in my room with keys. Patient reports having multiple medical Afibs and blood clots due to this. Patient reports people are doing things in the dining room. When asked if she called EMS, patient stated I called EMS, I stripped searched over the phone and called the authorities. Patient is admitted to [facility] unit with Q15 min safety monitoring. Multidisciplinary team approach is offered. Medication management; group/milieu therapy is offered.   Sister Tata Timmins : [phone]- called on 07/28/24  to update the med changes but went to generic voicemail Subjective:  Chart reviewed, case discussed in multidisciplinary meeting, patient seen during rounds.  The patient is well known to the provider from a prolonged hospitalization exceeding 10 days. She continues to demonstrate a prominent delusional thought process. She reports excessive sedation while taking Clozaril  and is expressing reluctance to continue this medication, citing fatigue and visual disturbances described as floaters. Insight into her illness and the need for ongoing psychiatric treatment remains limited.  She has become increasingly focused on requesting podiatry evaluation and exhibits irritability and frustration when these expectations are not met or when she perceives podiatry services as unsupportive. No aggressive behaviors have been observed, though irritability is noted.  The patient remains preoccupied with discontinuing Clozaril  and expresses hope for rapid discharge, with family  reportedly encouraging her desire to leave the hospital.  Continued inpatient care remains medically necessary due to persistent psychosis, medication intolerance and nonadherence concerns, irritability, limited insight, and the need for continued psychiatric stabilization and coordination of medical care Past Psychiatric History: see h&P Family History:  Family History  Problem Relation Age of Onset   Colon cancer Neg Hx    Celiac disease Neg Hx    Inflammatory bowel disease Neg Hx    Social History:  Social History   Substance and Sexual Activity  Alcohol Use Never     Social History   Substance and Sexual Activity  Drug Use Never    Social History   Socioeconomic History   Marital status: Single    Spouse name: Not on file   Number of children: Not on file   Years of education: Not on file   Highest education level: Not on file  Occupational History   Not on file  Tobacco Use   Smoking status: Never   Smokeless tobacco: Never  Substance and Sexual Activity   Alcohol use: Never   Drug use: Never   Sexual activity: Not Currently    Birth control/protection: Pill  Other Topics Concern   Not on file  Social History Narrative   Not on file   Social Drivers of Health   Tobacco Use: Low Risk (06/19/2024)   Patient History    Smoking Tobacco Use: Never    Smokeless Tobacco Use: Never    Passive Exposure: Not on file  Financial Resource Strain: Not on file  Food Insecurity: Unknown (06/19/2024)   Epic    Worried About Programme Researcher, Broadcasting/film/video in the Last Year: Never true    [employer] of Food in the Last Year: Patient declined  Transportation Needs:  No Transportation Needs (06/19/2024)   Epic    Lack of Transportation (Medical): No    Lack of Transportation (Non-Medical): No  Physical Activity: Not on file  Stress: Not on file  Social Connections: Not on file  Depression (PHQ2-9): Low Risk (12/21/2022)   Depression (PHQ2-9)    PHQ-2 Score: 0  Alcohol Screen: Low Risk  (06/19/2024)   Alcohol Screen    Last Alcohol Screening Score (AUDIT): 0  Housing: Low Risk (06/19/2024)   Epic    Unable to Pay for Housing in the Last Year: No    Number of Times Moved in the Last Year: 0    Homeless in the Last Year: No  Utilities: Not At Risk (06/19/2024)   Epic    Threatened with loss of utilities: No  Health Literacy: Not on file   Past Medical History:  Past Medical History:  Diagnosis Date   Chronic lower extremity pain (2ry area of Pain) (Right) 12/21/2022   Chronic pain syndrome 12/20/2022   DDD (degenerative disc disease), 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 Tobie: Pancolonic diverticulosis, tortuous colon, next colonoscopy 2024   TONSILLECTOMY      Current Medications: Current Facility-Administered Medications  Medication Dose Route Frequency Provider Last Rate Last Admin   acetaminophen  (TYLENOL ) tablet 650 mg  650 mg Oral Q6H PRN Onuoha, Chinwendu V, NP   650 mg at 07/11/24 1835   ARIPiprazole  (ABILIFY ) tablet 20 mg  20 mg Oral Daily Jadapalle, Sree, MD   20 mg at 07/30/24 9074   aspirin  EC tablet 81 mg  81 mg Oral Daily Bobbitt, Shalon E, NP   81 mg at 07/30/24 9065   cholecalciferol  (VITAMIN D3) 25 MCG (1000 UNIT) tablet 1,000 Units  1,000 Units Oral Weekly Jadapalle, Sree, MD   1,000 Units at 07/25/24 1230   cloZAPine  (CLOZARIL ) tablet 150 mg  150 mg Oral BID Jadapalle, Sree, MD   150 mg at 07/30/24 9073   Or   OLANZapine  (ZYPREXA ) injection 10 mg  10 mg Intramuscular BID Jadapalle, Sree, MD       dapagliflozin  propanediol (FARXIGA ) tablet 10 mg  10 mg Oral Daily Josette Ade, MD   10 mg at 07/30/24 9068   diclofenac  Sodium (VOLTAREN ) 1 % topical gel 4 g  4 g Topical QID PRN Josimar Corning R, MD       famotidine  (PEPCID ) tablet 40 mg  40 mg Oral Daily Shrivastava, Aryendra, MD   40 mg at  07/30/24 9072   fluticasone  (FLONASE ) 50 MCG/ACT nasal spray 1 spray  1 spray Each Nare Daily PRN Saeed Toren R, MD       furosemide  (LASIX ) tablet 40 mg  40 mg Oral 2 times per day Josette Ade, MD   40 mg at 07/30/24 9068   gabapentin  (NEURONTIN ) capsule 300 mg  300 mg Oral TID Bobbitt, Shalon E, NP   300 mg at 07/30/24 0932   hydrOXYzine  (ATARAX ) tablet 25 mg  25 mg Oral TID PRN Bobbitt, Shalon E, NP   25 mg at 07/29/24 2108   magnesium  hydroxide (MILK OF MAGNESIA) suspension 30 mL  30 mL Oral Daily PRN Bobbitt, Shalon E, NP       metoprolol  succinate (TOPROL -XL) 24 hr tablet 25 mg  25 mg Oral BID Josette Ade, MD   25  mg at 07/29/24 2108   multivitamin with minerals tablet 1 tablet  1 tablet Oral Daily Bobbitt, Shalon E, NP   1 tablet at 07/30/24 0933   naproxen  (NAPROSYN ) tablet 375 mg  375 mg Oral TID WC Jadapalle, Sree, MD   375 mg at 07/30/24 1237   OLANZapine  (ZYPREXA ) injection 5 mg  5 mg Intramuscular TID PRN Bobbitt, Shalon E, NP   5 mg at 06/22/24 2259   OLANZapine  zydis (ZYPREXA ) disintegrating tablet 5 mg  5 mg Oral TID PRN Bobbitt, Shalon E, NP   5 mg at 07/28/24 0806   ondansetron  (ZOFRAN ) tablet 4 mg  4 mg Oral Q8H PRN Bobbitt, Shalon E, NP   4 mg at 07/11/24 1835   Oxcarbazepine  (TRILEPTAL ) tablet 300 mg  300 mg Oral BID Jadapalle, Sree, MD   300 mg at 07/30/24 0933   potassium chloride  SA (KLOR-CON  M) CR tablet 20 mEq  20 mEq Oral BID Bobbitt, Shalon E, NP   20 mEq at 07/30/24 0934   rosuvastatin  (CRESTOR ) tablet 5 mg  5 mg Oral Daily Bobbitt, Shalon E, NP   5 mg at 07/30/24 0934   traZODone  (DESYREL ) tablet 50 mg  50 mg Oral QHS PRN Bobbitt, Shalon E, NP   50 mg at 07/26/24 2141   zolpidem  (AMBIEN ) tablet 10 mg  10 mg Oral QHS Jadapalle, Sree, MD   10 mg at 07/29/24 2108    Lab Results:  No results found for this or any previous visit (from the past 48 hours).       Blood Alcohol level:  Lab Results  Component Value Date   [facility] <15 06/18/2024     Metabolic Disorder Labs: Lab Results  Component Value Date   HGBA1C 5.9 (H) 06/28/2024   MPG 122.63 06/28/2024   No results found for: PROLACTIN Lab Results  Component Value Date   CHOL 156 06/28/2024   TRIG 89 06/28/2024   HDL 70 06/28/2024   CHOLHDL 2.2 06/28/2024   VLDL 18 06/28/2024   LDLCALC 69 06/28/2024    Physical Findings: AIMS:  , ,  ,  ,    CIWA:    COWS:      Psychiatric Specialty Exam:  Presentation  General Appearance:  Casual  Eye Contact: Fleeting  Speech: Normal Rate  Speech Volume: Increased    Mood and Affect  Mood: Euphoric  Affect: stable  Thought Process  Thought Processes: Improving illogical  Orientation:Full (Time, Place and Person)  Thought Content:Illogical; Delusions; Tangential  Hallucinations: Denies  Ideas of Reference:Delusions  Suicidal Thoughts: Denies  Homicidal Thoughts: Denies   Sensorium  Memory: Immediate Fair; Remote Fair  Judgment: Impaired  Insight: Shallow   Executive Functions  Concentration: Poor  Attention Span: Poor  Recall: [employer] of Knowledge: Fair  Language: Fair   Psychomotor Activity  Psychomotor Activity: No data recorded  Musculoskeletal: Strength & Muscle Tone: within normal limits Gait & Station: normal Assets  Assets: Manufacturing Systems Engineer; Desire for Improvement    Physical Exam: Physical Exam Vitals and nursing note reviewed.    ROS Blood pressure 122/79, pulse 98, temperature 98.2 F (36.8 C), resp. rate 18, height 5' 5 (1.651 m), weight 81 kg, SpO2 99%. Body mass index is 29.7 kg/m.  Diagnosis: Principal Problem:   Bilateral lower extremity edema Active Problems:   Hypercholesteremia   Schizoaffective disorder, bipolar type (HCC)   Chest pain   Sinus tachycardia   Hypotension   Controlled type 2 diabetes mellitus without complication,  without long-term current use of insulin (HCC)   Hyponatremia  Schizoaffective disorder  bipolar type  Clinical Decision Making: Patient is currently admitted for worsening psychosis, manic symptoms, tangential grandiosity.  She needs inpatient hospitalization for medication management and close monitoring.  Patient is unable to make decisions on her own as she is unable to appreciate and understand her mental health history, the current need for treatment.  [facility] has screened her case and for further evaluation for legal guardianship. [facility] screened in her case. Sister and mom are involved in her care.  Treatment Plan Summary:  Safety and Monitoring:             -- inoluntary admission to inpatient psychiatric unit for safety, stabilization and treatment             -- Daily contact with patient to assess and evaluate symptoms and progress in treatment             -- Patient's case to be discussed in multi-disciplinary team meeting             -- Observation Level: q15 minute checks             -- Vital signs:  q12 hours             -- Precautions: suicide, elopement, and assault   2. Psychiatric Diagnoses and Treatment:  Discontinue Depakote  as patient is noncompliant Discontinue Geodon  20 mg nightly as poor response Increased Clozaril  125 mg twice daily enforced with zyprexa  10mg  BID- Cymbalta  40 mg daily-discontinue to minimize manic Trilepta 300 mg twice daily Abilify  10 mg is added -- The risks/benefits/side-effects/alternatives to this medication were discussed in detail with the patient and time was given for questions. The patient consents to medication trial.                -- Metabolic profile and EKG monitoring obtained while on an atypical antipsychotic (BMI: Lipid Panel: HbgA1c: QTc:)              -- Encouraged patient to participate in unit milieu and in scheduled group therapies                            3. Medical Issues Being Addressed:   Chest pain Sinus tachycardia HTN (hypertension) Bilateral lower extremity edema Hypercholesteremia  12/30:  Troponin  negative x 2. Echocardiogram done with pending results.  Small improvement in lower extremity edema so continuing p.o. Lasix .  Will need BMP every other day.  4. Discharge Planning:   -- Social work and case management to assist with discharge planning and identification of hospital follow-up needs prior to discharge  -- Estimated LOS: 3-4 days  Millie JONELLE Manners, MD 07/30/2024, 1:09 PM

## 2024-07-30 NOTE — Group Note (Signed)
 Date:  07/30/2024 Time:  3:45 PM  Group Topic/Focus:  Self Care:   The focus of this group is to help patients understand the importance of self-care in order to improve or restore emotional, physical, spiritual, interpersonal, and financial health.    Participation Level:  Did Not Attend   Larrie [name] BRAVO 07/30/2024, 3:45 PM

## 2024-07-30 NOTE — Group Note (Signed)
 Date:  07/30/2024 Time:  10:39 AM  Group Topic/Focus:  Rediscovering Joy:   The focus of this group is to explore various ways to relieve stress in a positive manner.    Participation Level:  Minimal  Participation Quality:  Intrusive and Monopolizing  Affect:  Blunted and Not Congruent  Cognitive:  Alert  Insight: Limited  Engagement in Group:  Limited  Modes of Intervention:  Activity and Discussion  Additional Comments:     Maglione,Dorean Hiebert E 07/30/2024, 10:39 AM

## 2024-07-30 NOTE — Group Note (Signed)
 Date:  07/30/2024 Time:  8:56 PM  Group Topic/Focus:  Wrap-Up Group:   The focus of this group is to help patients review their daily goal of treatment and discuss progress on daily workbooks.  Coping Skills   Participation Level:  Active  Participation Quality:  Appropriate  Affect:  Appropriate  Cognitive:  Appropriate  Insight: Good  Engagement in Group:  Engaged  Modes of Intervention:  Discussion  Additional Comments:    Gwendlyn LITTIE Sharps 07/30/2024, 8:56 PM

## 2024-07-30 NOTE — Plan of Care (Signed)
" °  Problem: Education: Goal: Knowledge of [facility] Education information/materials will improve 07/30/2024 1920 by Claudene Bronwyn DASEN, RN Outcome: Progressing 07/30/2024 1918 by Claudene Bronwyn DASEN, RN Outcome: Progressing Goal: Emotional status will improve 07/30/2024 1920 by Claudene Bronwyn DASEN, RN Outcome: Progressing 07/30/2024 1918 by Claudene Bronwyn DASEN, RN Outcome: Progressing Goal: Mental status will improve 07/30/2024 1920 by Claudene Bronwyn DASEN, RN Outcome: Progressing 07/30/2024 1918 by Claudene Bronwyn DASEN, RN Outcome: Progressing Goal: Verbalization of understanding the information provided will improve 07/30/2024 1920 by Claudene Bronwyn DASEN, RN Outcome: Progressing 07/30/2024 1918 by Claudene Bronwyn DASEN, RN Outcome: Progressing   Problem: Activity: Goal: Interest or engagement in activities will improve 07/30/2024 1920 by Claudene Bronwyn DASEN, RN Outcome: Progressing 07/30/2024 1918 by Claudene Bronwyn DASEN, RN Outcome: Progressing Goal: Sleeping patterns will improve 07/30/2024 1920 by Claudene Bronwyn DASEN, RN Outcome: Progressing 07/30/2024 1918 by Claudene Bronwyn DASEN, RN Outcome: Progressing   "

## 2024-07-30 NOTE — Plan of Care (Signed)
" °  Problem: Education: Goal: Knowledge of [facility] Education information/materials will improve Outcome: Progressing Goal: Emotional status will improve Outcome: Progressing Goal: Mental status will improve Outcome: Progressing Goal: Verbalization of understanding the information provided will improve Outcome: Progressing   Problem: Activity: Goal: Interest or engagement in activities will improve Outcome: Progressing Goal: Sleeping patterns will improve Outcome: Progressing   Problem: Health Behavior/Discharge Planning: Goal: Identification of resources available to assist in meeting health care needs will improve Outcome: Progressing Goal: Compliance with treatment plan for underlying cause of condition will improve Outcome: Progressing   Problem: Coping: Goal: Ability to verbalize frustrations and anger appropriately will improve Outcome: Progressing Goal: Ability to demonstrate self-control will improve Outcome: Progressing   "

## 2024-07-30 NOTE — BH IP Treatment Plan (Signed)
 Interdisciplinary Treatment and Diagnostic Plan Update  07/30/2024 Time of Session: 2:45pm Valerie [name] MRN: [id number]  Principal Diagnosis: Bilateral lower extremity edema  Secondary Diagnoses: Principal Problem:   Bilateral lower extremity edema Active Problems:   Hypercholesteremia   Schizoaffective disorder, bipolar type (HCC)   Chest pain   Sinus tachycardia   Hypotension   Controlled type 2 diabetes mellitus without complication, without long-term current use of insulin (HCC)   Hyponatremia   Current Medications:  Current Facility-Administered Medications  Medication Dose Route Frequency Provider Last Rate Last Admin   acetaminophen  (TYLENOL ) tablet 650 mg  650 mg Oral Q6H PRN Onuoha, Chinwendu V, NP   650 mg at 07/11/24 1835   ARIPiprazole  (ABILIFY ) tablet 20 mg  20 mg Oral Daily Jadapalle, Sree, MD   20 mg at 07/30/24 9074   aspirin  EC tablet 81 mg  81 mg Oral Daily Bobbitt, Shalon E, NP   81 mg at 07/30/24 0934   cholecalciferol  (VITAMIN D3) 25 MCG (1000 UNIT) tablet 1,000 Units  1,000 Units Oral Weekly Jadapalle, Sree, MD   1,000 Units at 07/25/24 1230   cloZAPine  (CLOZARIL ) tablet 150 mg  150 mg Oral BID Jadapalle, Sree, MD   150 mg at 07/30/24 9073   Or   OLANZapine  (ZYPREXA ) injection 10 mg  10 mg Intramuscular BID Jadapalle, Sree, MD       dapagliflozin  propanediol (FARXIGA ) tablet 10 mg  10 mg Oral Daily Josette Ade, MD   10 mg at 07/30/24 0931   diclofenac  Sodium (VOLTAREN ) 1 % topical gel 4 g  4 g Topical QID PRN Madaram, Kondal R, MD       famotidine  (PEPCID ) tablet 40 mg  40 mg Oral Daily Shrivastava, Aryendra, MD   40 mg at 07/30/24 9072   fluticasone  (FLONASE ) 50 MCG/ACT nasal spray 1 spray  1 spray Each Nare Daily PRN Madaram, Kondal R, MD       furosemide  (LASIX ) tablet 40 mg  40 mg Oral 2 times per day Josette Ade, MD   40 mg at 07/30/24 0931   gabapentin  (NEURONTIN ) capsule 300 mg  300 mg Oral TID Bobbitt, Shalon E, NP   300 mg at 07/30/24  0932   hydrOXYzine  (ATARAX ) tablet 25 mg  25 mg Oral TID PRN Bobbitt, Shalon E, NP   25 mg at 07/29/24 2108   magnesium  hydroxide (MILK OF MAGNESIA) suspension 30 mL  30 mL Oral Daily PRN Bobbitt, Shalon E, NP       metoprolol  succinate (TOPROL -XL) 24 hr tablet 25 mg  25 mg Oral BID Josette Ade, MD   25 mg at 07/29/24 2108   multivitamin with minerals tablet 1 tablet  1 tablet Oral Daily Bobbitt, Shalon E, NP   1 tablet at 07/30/24 0933   naproxen  (NAPROSYN ) tablet 375 mg  375 mg Oral TID WC Jadapalle, Sree, MD   375 mg at 07/30/24 0932   OLANZapine  (ZYPREXA ) injection 5 mg  5 mg Intramuscular TID PRN Bobbitt, Shalon E, NP   5 mg at 06/22/24 2259   OLANZapine  zydis (ZYPREXA ) disintegrating tablet 5 mg  5 mg Oral TID PRN Bobbitt, Shalon E, NP   5 mg at 07/28/24 0806   ondansetron  (ZOFRAN ) tablet 4 mg  4 mg Oral Q8H PRN Bobbitt, Shalon E, NP   4 mg at 07/11/24 1835   Oxcarbazepine  (TRILEPTAL ) tablet 300 mg  300 mg Oral BID Jadapalle, Sree, MD   300 mg at 07/30/24 9066   potassium chloride  SA (KLOR-CON   M) CR tablet 20 mEq  20 mEq Oral BID Bobbitt, Shalon E, NP   20 mEq at 07/30/24 0934   rosuvastatin  (CRESTOR ) tablet 5 mg  5 mg Oral Daily Bobbitt, Shalon E, NP   5 mg at 07/30/24 9065   traZODone  (DESYREL ) tablet 50 mg  50 mg Oral QHS PRN Bobbitt, Shalon E, NP   50 mg at 07/26/24 2141   zolpidem  (AMBIEN ) tablet 10 mg  10 mg Oral QHS Jadapalle, Sree, MD   10 mg at 07/29/24 2108   PTA Medications: Medications Prior to Admission  Medication Sig Dispense Refill Last Dose/Taking   acetaminophen  (TYLENOL ) 650 MG CR tablet Take 650 mg by mouth every 8 (eight) hours as needed for pain.      benztropine (COGENTIN) 0.5 MG tablet Take by mouth.      bisacodyl  5 MG EC tablet As directed for procedure 8 tablet 0    Cholecalciferol  (VITAMIN D ) 125 MCG (5000 UT) CAPS Take 1,000 Units by mouth once a week.      cloZAPine  (CLOZARIL ) 100 MG tablet Take 200 mg by mouth 2 (two) times daily.      DULoxetine   (CYMBALTA ) 20 MG capsule Take 20 mg by mouth daily.      fluticasone  (FLONASE ) 50 MCG/ACT nasal spray Place 1 spray into both nostrils daily.      gabapentin  (NEURONTIN ) 300 MG capsule Take 300 mg by mouth 3 (three) times daily.      haloperidol  (HALDOL ) 0.5 MG tablet Take 0.5 mg by mouth 2 (two) times daily.      hydrochlorothiazide  (HYDRODIURIL ) 25 MG tablet Take 25 mg by mouth daily.      lisinopril  (ZESTRIL ) 30 MG tablet Take 30 mg by mouth daily.      loperamide (IMODIUM) 2 MG capsule Take 2 mg by mouth as needed for diarrhea or loose stools. Take 1 capsule by mouth as needed with each loose stool/diarrhea nte 8 doses in 24 hr      Multiple Vitamin (DAILY VITE PO) Take 1 tablet by mouth daily.      naproxen  (NAPROSYN ) 500 MG tablet Take 500 mg by mouth every 12 (twelve) hours as needed.      omeprazole (PRILOSEC) 20 MG capsule Take 20 mg by mouth daily.      ondansetron  (ZOFRAN ) 4 MG tablet Take 4 mg by mouth every 8 (eight) hours as needed for nausea or vomiting.      potassium chloride  SA (KLOR-CON  M) 20 MEQ tablet Take 20 mEq by mouth 2 (two) times daily.      propranolol  (INDERAL ) 40 MG tablet Take 40 mg by mouth 2 (two) times daily.      rosuvastatin  (CRESTOR ) 5 MG tablet Take 5 mg by mouth daily.      sodium phosphate  (FLEET) ENEM As directed for procedure 133 mL 0    triamcinolone ointment (KENALOG) 0.1 % Apply topically.       Patient Stressors: Medication change or noncompliance    Patient Strengths: Ability for insight   Treatment Modalities: Medication Management, Group therapy, Case management,  1 to 1 session with clinician, Psychoeducation, Recreational therapy.   Physician Treatment Plan for Primary Diagnosis: Bilateral lower extremity edema Long Term Goal(s): Improvement in symptoms so as ready for discharge   Short Term Goals: Ability to identify changes in lifestyle to reduce recurrence of condition will improve Ability to verbalize feelings will improve Ability  to disclose and discuss suicidal ideas Ability to demonstrate self-control will improve  Ability to identify and develop effective coping behaviors will improve  Medication Management: Evaluate patient's response, side effects, and tolerance of medication regimen.  Therapeutic Interventions: 1 to 1 sessions, Unit Group sessions and Medication administration.  Evaluation of Outcomes: Progressing  Physician Treatment Plan for Secondary Diagnosis: Principal Problem:   Bilateral lower extremity edema Active Problems:   Hypercholesteremia   Schizoaffective disorder, bipolar type (HCC)   Chest pain   Sinus tachycardia   Hypotension   Controlled type 2 diabetes mellitus without complication, without long-term current use of insulin (HCC)   Hyponatremia  Long Term Goal(s): Improvement in symptoms so as ready for discharge   Short Term Goals: Ability to identify changes in lifestyle to reduce recurrence of condition will improve Ability to verbalize feelings will improve Ability to disclose and discuss suicidal ideas Ability to demonstrate self-control will improve Ability to identify and develop effective coping behaviors will improve     Medication Management: Evaluate patient's response, side effects, and tolerance of medication regimen.  Therapeutic Interventions: 1 to 1 sessions, Unit Group sessions and Medication administration.  Evaluation of Outcomes: Progressing   RN Treatment Plan for Primary Diagnosis: Bilateral lower extremity edema Long Term Goal(s): Knowledge of disease and therapeutic regimen to maintain health will improve  Short Term Goals: Ability to remain free from injury will improve, Ability to verbalize frustration and anger appropriately will improve, Ability to demonstrate self-control, Ability to participate in decision making will improve, Ability to verbalize feelings will improve, Ability to disclose and discuss suicidal ideas, Ability to identify and develop  effective coping behaviors will improve, and Compliance with prescribed medications will improve  Medication Management: RN will administer medications as ordered by provider, will assess and evaluate patient's response and provide education to patient for prescribed medication. RN will report any adverse and/or side effects to prescribing provider.  Therapeutic Interventions: 1 on 1 counseling sessions, Psychoeducation, Medication administration, Evaluate responses to treatment, Monitor vital signs and CBGs as ordered, Perform/monitor CIWA, COWS, AIMS and Fall Risk screenings as ordered, Perform wound care treatments as ordered.  Evaluation of Outcomes: Progressing   LCSW Treatment Plan for Primary Diagnosis: Bilateral lower extremity edema Long Term Goal(s): Safe transition to appropriate next level of care at discharge, Engage patient in therapeutic group addressing interpersonal concerns.  Short Term Goals: Engage patient in aftercare planning with referrals and resources, Increase social support, Increase ability to appropriately verbalize feelings, Increase emotional regulation, Facilitate acceptance of mental health diagnosis and concerns, Facilitate patient progression through stages of change regarding substance use diagnoses and concerns, Identify triggers associated with mental health/substance abuse issues, and Increase skills for wellness and recovery  Therapeutic Interventions: Assess for all discharge needs, 1 to 1 time with Social worker, Explore available resources and support systems, Assess for adequacy in community support network, Educate family and significant other(s) on suicide prevention, Complete Psychosocial Assessment, Interpersonal group therapy.  Evaluation of Outcomes: Progressing Progress in Treatment: Attending groups: Yes. and No. Participating in groups: Yes. and No. Taking medication as prescribed: Yes. Toleration medication: Yes. Family/Significant other  contact made: Yes, individual(s) contacted:  Yes, pt's sister and mother  Patient understands diagnosis: No. Discussing patient identified problems/goals with staff: Yes. Medical problems stabilized or resolved: Yes. Denies suicidal/homicidal ideation: Yes. Issues/concerns per patient self-inventory: Yes. Other: none   New problem(s) identified: No, Describe:  none   New Short Term/Long Term Goal(s):elimination of symptoms of psychosis, medication management for mood stabilization; elimination of SI thoughts; development of comprehensive mental wellness  plan. 06/29/24 Update: No changes at this time. Update 07/04/24: No changes at this time Update 07/10/24: No changes at this time Update 07/10/24: No changes at this time 07/15/24 no changes Update 07/20/24: No changes at this time 07/15/24 no changes Update 07/25/24: No changes at this time Update 07/30/24: No changes at this time.   Patient Goals:    To continue to get well 06/29/24 Update: No changes at this time. Update 07/04/24: No changes at this time Update 07/10/24: No changes at this time Update 07/10/24: No changes at this time 07/15/24 no changes Update 07/20/24: No changes at this time 07/15/24 no changes Update 07/25/24: No changes at this time: Update 07/30/24: No changes at this time.   Discharge Plan or Barriers: CSW will assist with appropriate discharge planning 06/29/24 Update:  CSW has made APS report to [facility] following safety concerns. CSW to continue to coordinate with DSS worker to continue to assess. Update 07/04/24: No changes at this time Update 07/10/24: No changes at this time Update 07/10/24: DSS reports no changes at this time, DSS unsure if pt's family has filed for the guardianship. Caseworker Adams Louder reports she will check in with CSW after New Years 07/15/2024 no changes Update 07/20/24: Pt's family filed for guardianship, court date scheduled for 08/25/23 Update 07/25/24: No changes at this time. Update  07/30/24: No changes at this time.   Reason for Continuation of Hospitalization: Mania Medication stabilization    Estimated Length of Stay: TBD 07/15/24 Update 07/20/24: TBD Update 07/25/24: TBD Update 07/30/24: TBD     Last 3 [place] Suicide Severity Risk Score: Flowsheet Row Admission (Current) from 06/19/2024 in [facility] ED from [facility]/18/2024 [facility] from 05/24/2022 in [facility] Emergency Department at [facility]  C-SSRS RISK CATEGORY No Risk No Risk No Risk    Last PHQ 2/9 Scores:    12/21/2022    9:00 AM  Depression screen PHQ 2/9  Decreased Interest 0  Down, Depressed, Hopeless 0  PHQ - 2 Score 0    Scribe for Treatment Team: Shaliyah Taite W Matalie Romberger, LCSWA 07/30/2024 11:17 AM

## 2024-07-31 DIAGNOSIS — F250 Schizoaffective disorder, bipolar type: Secondary | ICD-10-CM | POA: Diagnosis not present

## 2024-07-31 LAB — GLUCOSE, CAPILLARY
Glucose-Capillary: 108 mg/dL — ABNORMAL HIGH (ref 70–99)
Glucose-Capillary: 115 mg/dL — ABNORMAL HIGH (ref 70–99)

## 2024-07-31 NOTE — Group Note (Signed)
 Physical/Occupational Therapy Group Note  Group Topic: Yoga  Group Date: 07/31/2024 Start Time: 1300 End Time: 1325 Facilitators: Mayan Dolney, Alm Hamilton, PT   Group Description: Group participated with series of yoga poses, designed to emphasize functional sitting balance, core stability, generalized flexibility and overall posture.  Incorporated deep breathing techniques with poses, working to promote relaxation, mindfulness and focus with targeted activities.  Discussed benefits of yoga in improving mood and self-esteem, reducing stress and anxiety, and promoting functional strength, balance and core stability for each participant.  Discussed ways to integrate into each participants daily routine.  Provided handout with written and pictorial descriptions of included yoga movements to be utilized as appropriate outside of group time.  Therapeutic Goal(s):  Demonstrate safe ability to participate with yoga poses during group activity. Identify one benefit of participation with yoga poses as part of each participants exercise/movement routine. Identify 1-2 individual poses that participant feels most beneficial to his/her needs and that he/she can easily replicate outside of group.  Individual Participation: Pt pleasant and participated in both the discussion and activity portions of the session. Pt did leave the day room with around 5-10 min left in the session with no reason provided.  Pt was able to modify activities with min cuing as needed for patient comfort.   Participation Level: Active and Engaged   Participation Quality: Minimal Cues   Behavior: Appropriate   Speech/Thought Process: Coherent   Affect/Mood: Appropriate   Insight: Moderate   Judgement: Moderate   Modes of Intervention: Activity, Discussion, and Education  Patient Response to Interventions:  Attentive, Engaged, and Interested    Plan: Continue to engage patient in PT/OT groups 1 - 2x/week.  CHARM Hamilton Bertin PT,  DPT 07/31/24, 2:23 PM

## 2024-07-31 NOTE — Progress Notes (Signed)
" °   07/31/24 2200  Psych Admission Type (Psych Patients Only)  Admission Status Involuntary  Psychosocial Assessment  Patient Complaints Anxiety  Eye Contact Fair  Facial Expression Anxious  Affect Preoccupied;Labile  Speech Tangential  Interaction Minimal  Motor Activity Slow  Appearance/Hygiene In scrubs  Behavior Characteristics Cooperative;Anxious  Mood Labile  Thought Process  Coherency Disorganized  Content Preoccupation  Delusions Paranoid;Grandeur;Religious  Perception Hallucinations  Hallucination Auditory  Judgment Impaired  Confusion Mild  Danger to Self  Current suicidal ideation? Denies  Agreement Not to Harm Self Yes  Description of Agreement Verbal  Danger to Others  Danger to Others None reported or observed    "

## 2024-07-31 NOTE — Plan of Care (Signed)
   Problem: Education: Goal: Knowledge of [place] [facility] Education information/materials will improve Outcome: Progressing Goal: Emotional status will improve Outcome: Progressing Goal: Mental status will improve Outcome: Progressing

## 2024-07-31 NOTE — Progress Notes (Signed)
 SPIRITUAL CARE AND COUNSELING CONSULT NOTE   VISIT SUMMARY Chaplain provided spiritual/emotional support to [place] while rounding on unit. Chaplain consulted with nurse team.   ZELPHIA GUILE                                                                                                                                                                      Type of Visit: Initial Care provided to:: Patient Conversation partners present during encounter: Nurse Referral source: Patient request Reason for visit: Routine spiritual support OnCall Visit: No   SPIRITUAL FRAMEWORK  Presenting Themes: Goals in life/care, Community and relationships Community/Connection: Family Patient Stress Factors: Health changes, Exhausted Family Stress Factors: None identified   GOALS   Self/Personal Goals: healing, discharged Clinical Care Goals: healing, discharged   INTERVENTIONS   Spiritual Care Interventions Made: Compassionate presence, Explored ethical dilemma, Established relationship of care and support    INTERVENTION OUTCOMES   Outcomes: Awareness around self/spiritual resourses, Autonomy/agency, Awareness of support  Chaplain provided compassionate presence,meaning oriented conversation, open ended questions, and advocacy for patient resources to elicit Armella's feelings about health status, treatment plan, faith background, family support, and desired outcomes.   SPIRITUAL CARE PLAN   Spiritual Care Issues Still Outstanding: Chaplain will continue to follow    If immediate needs arise, please contact [facility] 24 hour on call [phone]   Barabara Chess, Chaplain  07/31/2024 4:57 PM

## 2024-07-31 NOTE — Group Note (Signed)
 Date:  07/31/2024 Time:  11:19 AM  Group Topic/Focus:  Personal Choices and Values:   The focus of this group is to help patients assess and explore the importance of values in their lives, how their values affect their decisions, how they express their values and what opposes their expression.    Participation Level:  Active  Participation Quality:  Appropriate  Affect:  Appropriate  Cognitive:  Appropriate  Insight: Appropriate  Engagement in Group:  Engaged  Modes of Intervention:  Activity  Additional Comments:    Camellia HERO Alfonzia Woolum 07/31/2024, 11:19 AM

## 2024-07-31 NOTE — Progress Notes (Signed)
 Patient anxious and yelling this am Prn ativan given and effective. Compliant with medications no adverse effects noted. Q 15 minutes safety check ongoing.

## 2024-07-31 NOTE — Group Note (Signed)
 Recreation Therapy Group Note   Group Topic:Coping Skills  Group Date: 07/31/2024 Start Time: 1400 End Time: 1435 Facilitators: Celestia Jeoffrey BRAVO, LRT, CTRS Location: Dayroom  Group Description: Meditation. LRT and patients discussed what they know about meditation and mindfulness. LRT played a Deep Breathing Meditation exercise script for patients to follow along to. LRT and patients discussed how meditation and deep breathing can be used as a coping skill post--discharge to help manage symptoms of stress.   Goal Area(s) Addressed: Patient will practice using relaxation technique. Patient will identify a new coping skill.  Patient will follow multistep directions to reduce anxiety and stress.   Affect/Mood: Flat   Participation Level: Minimal    Clinical Observations/Individualized Feedback: Zeda was present in group for 5 minutes. Pt was loud and tearful when present.   Plan: Continue to engage patient in RT group sessions 2-3x/week.   Jeoffrey BRAVO Celestia, LRT, CTRS 07/31/2024 5:06 PM

## 2024-07-31 NOTE — Progress Notes (Signed)
 [facility] MD Progress Note   10:00 AM [name]  MRN:  [id number]   Valerie [name] is a 64 year old female presenting voluntary to [facility] requesting a psychiatric evaluation for worsening psychosis. Patient has history of schizophrenia. Patient denied SI, HI, psychosis and alcohol/drug usage. Patient resides at [facility]. Patient reports people are coming by my door talking loud, yelling and screaming and coming in my room with keys. Patient reports having multiple medical Afibs and blood clots due to this. Patient reports people are doing things in the dining room. When asked if she called EMS, patient stated I called EMS, I stripped searched over the phone and called the authorities. Patient is admitted to [facility] unit with Q15 min safety monitoring. Multidisciplinary team approach is offered. Medication management; group/milieu therapy is offered.   Sister Sharalyn Lomba : Zada Clarity, [phone].  Gwendalynn Eckstrom reports that she and her mother Hargis Ina [phone] has a court date for guardianship for pt on February 12th at 9:30am in [place].    Subjective:  Chart reviewed, case discussed in multidisciplinary meeting, patient seen during rounds.  Patient is visible on the unit.  She remains intrusive at times.  Patient remains discharge focused and wants the provider to let her go.  She is unable to participate in discharge planning.  Treatment team has decided to reach out to the sister and mom to discuss the med adjustments.  Patient remains disorganized with thought process.  She denies SI/HI/plan and denies hallucinations Past Psychiatric History: see h&P Family History:  Family History  Problem Relation Age of Onset   Colon cancer Neg Hx    Celiac disease Neg Hx    Inflammatory bowel disease Neg Hx    Social History:  Social History   Substance and Sexual Activity  Alcohol Use Never     Social History   Substance and Sexual Activity   Drug Use Never    Social History   Socioeconomic History   Marital status: Single    Spouse name: Not on file   Number of children: Not on file   Years of education: Not on file   Highest education level: Not on file  Occupational History   Not on file  Tobacco Use   Smoking status: Never   Smokeless tobacco: Never  Substance and Sexual Activity   Alcohol use: Never   Drug use: Never   Sexual activity: Not Currently    Birth control/protection: Pill  Other Topics Concern   Not on file  Social History Narrative   Not on file   Social Drivers of Health   Tobacco Use: Low Risk (06/19/2024)   Patient History    Smoking Tobacco Use: Never    Smokeless Tobacco Use: Never    Passive Exposure: Not on file  Financial Resource Strain: Not on file  Food Insecurity: Unknown (06/19/2024)   Epic    Worried About Programme Researcher, Broadcasting/film/video in the Last Year: Never true    [employer] of Food in the Last Year: Patient declined  Transportation Needs: No Transportation Needs (06/19/2024)   Epic    Lack of Transportation (Medical): No    Lack of Transportation (Non-Medical): No  Physical Activity: Not on file  Stress: Not on file  Social Connections: Not on file  Depression (PHQ2-9): Low Risk (12/21/2022)   Depression (PHQ2-9)    PHQ-2 Score: 0  Alcohol Screen: Low Risk (06/19/2024)   Alcohol Screen  Last Alcohol Screening Score (AUDIT): 0  Housing: Low Risk (06/19/2024)   Epic    Unable to Pay for Housing in the Last Year: No    Number of Times Moved in the Last Year: 0    Homeless in the Last Year: No  Utilities: Not At Risk (06/19/2024)   Epic    Threatened with loss of utilities: No  Health Literacy: Not on file   Past Medical History:  Past Medical History:  Diagnosis Date   Chronic lower extremity pain (2ry area of Pain) (Right) 12/21/2022   Chronic pain syndrome 12/20/2022   DDD (degenerative disc disease), lumbosacral 12/21/2022   HTN (hypertension)    Hypercholesteremia     Prediabetes    Schizophrenia (HCC)     Past Surgical History:  Procedure Laterality Date   COLONOSCOPY  2014   Dr. Tobie: Pancolonic diverticulosis, tortuous colon, next colonoscopy 2024   TONSILLECTOMY      Current Medications: Current Facility-Administered Medications  Medication Dose Route Frequency Provider Last Rate Last Admin   acetaminophen  (TYLENOL ) tablet 650 mg  650 mg Oral Q6H PRN Onuoha, Chinwendu V, NP   650 mg at 07/11/24 1835   ARIPiprazole  (ABILIFY ) tablet 20 mg  20 mg Oral Daily Torey Reinard, MD   20 mg at 07/31/24 0911   aspirin  EC tablet 81 mg  81 mg Oral Daily Bobbitt, Shalon E, NP   81 mg at 07/31/24 0912   cholecalciferol  (VITAMIN D3) 25 MCG (1000 UNIT) tablet 1,000 Units  1,000 Units Oral Weekly Gaige Sebo, MD   1,000 Units at 07/25/24 1230   cloZAPine  (CLOZARIL ) tablet 150 mg  150 mg Oral BID Delonta Yohannes, MD   150 mg at 07/31/24 0915   Or   OLANZapine  (ZYPREXA ) injection 10 mg  10 mg Intramuscular BID Lorilee Cafarella, MD       dapagliflozin  propanediol (FARXIGA ) tablet 10 mg  10 mg Oral Daily Josette Ade, MD   10 mg at 07/31/24 9080   diclofenac  Sodium (VOLTAREN ) 1 % topical gel 4 g  4 g Topical QID PRN Madaram, Kondal R, MD       famotidine  (PEPCID ) tablet 40 mg  40 mg Oral Daily Shrivastava, Aryendra, MD   40 mg at 07/31/24 0913   fluticasone  (FLONASE ) 50 MCG/ACT nasal spray 1 spray  1 spray Each Nare Daily PRN Madaram, Kondal R, MD       furosemide  (LASIX ) tablet 40 mg  40 mg Oral 2 times per day Josette Ade, MD   40 mg at 07/31/24 0900   gabapentin  (NEURONTIN ) capsule 300 mg  300 mg Oral TID Bobbitt, Shalon E, NP   300 mg at 07/31/24 9087   hydrOXYzine  (ATARAX ) tablet 25 mg  25 mg Oral TID PRN Bobbitt, Shalon E, NP   25 mg at 07/31/24 0911   magnesium  hydroxide (MILK OF MAGNESIA) suspension 30 mL  30 mL Oral Daily PRN Bobbitt, Shalon E, NP       metoprolol  succinate (TOPROL -XL) 24 hr tablet 25 mg  25 mg Oral BID Josette Ade, MD   25  mg at 07/31/24 0912   multivitamin with minerals tablet 1 tablet  1 tablet Oral Daily Bobbitt, Shalon E, NP   1 tablet at 07/31/24 0913   naproxen  (NAPROSYN ) tablet 375 mg  375 mg Oral TID WC Nishika Parkhurst, MD   375 mg at 07/31/24 0900   OLANZapine  (ZYPREXA ) injection 5 mg  5 mg Intramuscular TID PRN Bobbitt, Shalon E, NP  5 mg at 06/22/24 2259   OLANZapine  zydis (ZYPREXA ) disintegrating tablet 5 mg  5 mg Oral TID PRN Bobbitt, Shalon E, NP   5 mg at 07/28/24 0806   ondansetron  (ZOFRAN ) tablet 4 mg  4 mg Oral Q8H PRN Bobbitt, Shalon E, NP   4 mg at 07/11/24 1835   Oxcarbazepine  (TRILEPTAL ) tablet 300 mg  300 mg Oral BID Oluwadarasimi Favor, MD   300 mg at 07/31/24 9081   potassium chloride  SA (KLOR-CON  M) CR tablet 20 mEq  20 mEq Oral BID Bobbitt, Shalon E, NP   20 mEq at 07/31/24 0912   rosuvastatin  (CRESTOR ) tablet 5 mg  5 mg Oral Daily Bobbitt, Shalon E, NP   5 mg at 07/31/24 0912   traZODone  (DESYREL ) tablet 50 mg  50 mg Oral QHS PRN Bobbitt, Shalon E, NP   50 mg at 07/26/24 2141   zolpidem  (AMBIEN ) tablet 10 mg  10 mg Oral QHS Caressa Scearce, MD   10 mg at 07/30/24 2201    Lab Results:  No results found for this or any previous visit (from the past 48 hours).       Blood Alcohol level:  Lab Results  Component Value Date   [facility] <15 06/18/2024    Metabolic Disorder Labs: Lab Results  Component Value Date   HGBA1C 5.9 (H) 06/28/2024   MPG 122.63 06/28/2024   No results found for: PROLACTIN Lab Results  Component Value Date   CHOL 156 06/28/2024   TRIG 89 06/28/2024   HDL 70 06/28/2024   CHOLHDL 2.2 06/28/2024   VLDL 18 06/28/2024   LDLCALC 69 06/28/2024    Physical Findings: AIMS:  , ,  ,  ,    CIWA:    COWS:      Psychiatric Specialty Exam:  Presentation  General Appearance:  Casual  Eye Contact: Fleeting  Speech: Normal Rate  Speech Volume: Increased    Mood and Affect  Mood: Euphoric  Affect: stable  Thought Process  Thought  Processes: Improving illogical  Orientation:Full (Time, Place and Person)  Thought Content:Illogical; Delusions; Tangential  Hallucinations: Denies  Ideas of Reference:Delusions  Suicidal Thoughts: Denies  Homicidal Thoughts: Denies   Sensorium  Memory: Immediate Fair; Remote Fair  Judgment: Impaired  Insight: Shallow   Executive Functions  Concentration: Poor  Attention Span: Poor  Recall: [employer] of Knowledge: Fair  Language: Fair   Psychomotor Activity  Psychomotor Activity: No data recorded  Musculoskeletal: Strength & Muscle Tone: within normal limits Gait & Station: normal Assets  Assets: Manufacturing Systems Engineer; Desire for Improvement    Physical Exam: Physical Exam Vitals and nursing note reviewed.    ROS Blood pressure 119/63, pulse 95, temperature (!) 97.2 F (36.2 C), resp. rate 14, height 5' 5 (1.651 m), weight 87.3 kg, SpO2 100%. Body mass index is 32.03 kg/m.  Diagnosis: Principal Problem:   Bilateral lower extremity edema Active Problems:   Hypercholesteremia   Schizoaffective disorder, bipolar type (HCC)   Chest pain   Sinus tachycardia   Hypotension   Controlled type 2 diabetes mellitus without complication, without long-term current use of insulin (HCC)   Hyponatremia  Schizoaffective disorder bipolar type  Clinical Decision Making: Patient is currently admitted for worsening psychosis, manic symptoms, tangential grandiosity.  She needs inpatient hospitalization for medication management and close monitoring.  Patient is unable to make decisions on her own as she is unable to appreciate and understand her mental health history, the current need for treatment.  [facility] has screened her case and for further evaluation for legal guardianship. [facility] screened in her case. Sister and mom are involved in her care.  Treatment Plan Summary:  Safety and Monitoring:             -- inoluntary admission to inpatient psychiatric unit  for safety, stabilization and treatment             -- Daily contact with patient to assess and evaluate symptoms and progress in treatment             -- Patient's case to be discussed in multi-disciplinary team meeting             -- Observation Level: q15 minute checks             -- Vital signs:  q12 hours             -- Precautions: suicide, elopement, and assault   2. Psychiatric Diagnoses and Treatment:  Discontinue Depakote  as patient is noncompliant Discontinue Geodon  20 mg nightly as poor response Increased Clozaril  125 mg twice daily enforced with zyprexa  10mg  BID- Cymbalta  40 mg daily-discontinue to minimize manic Trilepta 300 mg twice daily Abilify  10 mg is added -- The risks/benefits/side-effects/alternatives to this medication were discussed in detail with the patient and time was given for questions. The patient consents to medication trial.                -- Metabolic profile and EKG monitoring obtained while on an atypical antipsychotic (BMI: Lipid Panel: HbgA1c: QTc:)              -- Encouraged patient to participate in unit milieu and in scheduled group therapies                            3. Medical Issues Being Addressed:   Chest pain Sinus tachycardia HTN (hypertension) Bilateral lower extremity edema Hypercholesteremia  12/30:  Troponin negative x 2. Echocardiogram done with pending results.  Small improvement in lower extremity edema so continuing p.o. Lasix .  Will need BMP every other day.  4. Discharge Planning:   -- Social work and case management to assist with discharge planning and identification of hospital follow-up needs prior to discharge  -- Estimated LOS: 3-4 days  Oneisha Ammons, MD 07/31/2024, 10:00 AM

## 2024-07-31 NOTE — Plan of Care (Signed)
" °  Problem: Education: Goal: Emotional status will improve Outcome: Progressing   Problem: Education: Goal: Mental status will improve Outcome: Progressing   Problem: Education: Goal: Verbalization of understanding the information provided will improve Outcome: Progressing   Problem: Activity: Goal: Interest or engagement in activities will improve Outcome: Progressing   Problem: Activity: Goal: Sleeping patterns will improve Outcome: Progressing   Problem: Coping: Goal: Ability to demonstrate self-control will improve Outcome: Progressing   Problem: Health Behavior/Discharge Planning: Goal: Compliance with treatment plan for underlying cause of condition will improve Outcome: Progressing   Problem: Health Behavior/Discharge Planning: Goal: Identification of resources available to assist in meeting health care needs will improve Outcome: Progressing   Problem: Physical Regulation: Goal: Ability to maintain clinical measurements within normal limits will improve Outcome: Progressing   "

## 2024-07-31 NOTE — Group Note (Signed)
 Date:  07/31/2024 Time:  8:59 PM  Group Topic/Focus:  Personal Choices and Values:   The focus of this group is to help patients assess and explore the importance of values in their lives, how their values affect their decisions, how they express their values and what opposes their expression.    Participation Level:  Active  Participation Quality:  Appropriate  Affect:  Appropriate  Cognitive:  Appropriate  Insight: Improving  Engagement in Group:  Limited  Modes of Intervention:  Activity  Additional Comments:    Gwendlyn LITTIE Sharps 07/31/2024, 8:59 PM

## 2024-07-31 NOTE — Progress Notes (Signed)
" °   07/30/24 2000  Psych Admission Type (Psych Patients Only)  Admission Status Involuntary  Psychosocial Assessment  Patient Complaints Anxiety  Eye Contact Fair  Facial Expression Animated  Affect Preoccupied;Anxious;Labile  Speech Tangential;Loud  Interaction Assertive  Motor Activity Slow  Appearance/Hygiene In scrubs  Behavior Characteristics Anxious  Mood Preoccupied;Labile  Thought Process  Coherency Disorganized  Content Preoccupation  Delusions Grandeur;Paranoid;Religious  Perception Hallucinations  Hallucination Auditory  Judgment Impaired  Confusion Mild  Danger to Self  Current suicidal ideation? Denies  Agreement Not to Harm Self Yes  Description of Agreement verbal  Danger to Others  Danger to Others None reported or observed    "

## 2024-08-01 DIAGNOSIS — F250 Schizoaffective disorder, bipolar type: Secondary | ICD-10-CM | POA: Diagnosis not present

## 2024-08-01 LAB — GLUCOSE, CAPILLARY: Glucose-Capillary: 129 mg/dL — ABNORMAL HIGH (ref 70–99)

## 2024-08-01 MED ORDER — ARIPIPRAZOLE 10 MG PO TABS
30.0000 mg | ORAL_TABLET | Freq: Every day | ORAL | Status: DC
  Administered 2024-08-02 – 2024-08-16 (×13): 30 mg via ORAL
  Filled 2024-08-01 (×9): qty 6
  Filled 2024-08-01: qty 3
  Filled 2024-08-01 (×4): qty 6
  Filled 2024-08-01: qty 3
  Filled 2024-08-01 (×2): qty 6

## 2024-08-01 NOTE — Progress Notes (Signed)
" °   08/01/24 0542  15 Minute Checks  Location Bedroom  Visual Appearance Calm  Behavior Composed  Sleep (Behavioral Health Patients Only)  Calculate sleep? (Click Yes once per 24 hr at 0600 safety check) Yes  OTHER  Documented sleep last 24 hours 7    "

## 2024-08-01 NOTE — Group Note (Signed)
 LCSW Group Therapy Note    Group Date: 08/01/2024 Start Time: 1300 End Time: 1400   Type of Therapy and Topic: Group Therapy: Body Image  Participation Level:  Active  Description of Group:  Patients were educated about body image and asked to think about whether they have a healthy or unhealthy body image. Patients were led in a discussion about factors that contribute to body image, both internal and external. Patients were asked to discuss strengths of the human body outside of appearance, such as being able to fight off diseases and provide stress relief. Lastly, patients were asked to identify one way in which they appreciate their own body outside of appearance.   Therapeutic Goals:   1. Patient will differentiate between a healthy and unhealthy body image. 2. Patient will identify what contributes to body image 3. Patient will discuss the strengths of the human body. 4. Patient will identify a positive attribute of their body outside of physical appearance.  Summary of Patient Progress:  The patient and facilitator discussed body positivity and explored mindful ways to move the body. The patient and group leader participated in a chair-based workout focused on releasing tension in the body caused by stress. The patient and facilitators engaged in body-focused activities, emphasizing the importance of daily movement, self-love, and mindfulness.  Therapeutic Modalities: Cognitive Behavioral Therapy; Solution-Focused Therapy  Brayon Bielefeld M Brettney Ficken, LCSWA 08/01/2024  2:49 PM

## 2024-08-01 NOTE — Progress Notes (Signed)
 [facility] MD Progress Note   12:07 PM Valerie [name]  MRN:  [id number]   Valerie [name] is a 64 year old female presenting voluntary to [facility] requesting a psychiatric evaluation for worsening psychosis. Patient has history of schizophrenia. Patient denied SI, HI, psychosis and alcohol/drug usage. Patient resides at [facility]. Patient reports people are coming by my door talking loud, yelling and screaming and coming in my room with keys. Patient reports having multiple medical Afibs and blood clots due to this. Patient reports people are doing things in the dining room. When asked if she called EMS, patient stated I called EMS, I stripped searched over the phone and called the authorities. Patient is admitted to [facility] unit with Q15 min safety monitoring. Multidisciplinary team approach is offered. Medication management; group/milieu therapy is offered.   [name] : [phone]- called on 07/28/24  to update the med changes but went to generic voicemail Subjective:  Chart reviewed, case discussed in multidisciplinary meeting, patient seen during rounds.  Patient is requesting to talk to the provider.  Per nursing patient remains intrusive at times with abrupt screaming spells with no triggers.  Patient remains discharge focused and request to be discharged to her sister and mother.  She does not endorse SI/HI/plan and does not endorse hallucinations.  She continues to display disorganized thought process in spite of being on 2 antipsychotics Clozaril  and Abilify .  She already failed Geodon , Zyprexa  and is allergic to Risperdal/Invega.  She refused to Depakote  and any mood stabilizer and they cannot be enforced as there is no availability of IM forms of mood stabilizers like Depakote  and lithium.  Per nursing report patient has fair appetite Provider spoke at length with patient's sister who applied for guardianship and discussed the treatment plan and change in  medications.  Sister expressed her understanding.  Past Psychiatric History: see h&P Family History:  Family History  Problem Relation Age of Onset   Colon cancer Neg Hx    Celiac disease Neg Hx    Inflammatory bowel disease Neg Hx    Social History:  Social History   Substance and Sexual Activity  Alcohol Use Never     Social History   Substance and Sexual Activity  Drug Use Never    Social History   Socioeconomic History   Marital status: Single    Spouse name: Not on file   Number of children: Not on file   Years of education: Not on file   Highest education level: Not on file  Occupational History   Not on file  Tobacco Use   Smoking status: Never   Smokeless tobacco: Never  Substance and Sexual Activity   Alcohol use: Never   Drug use: Never   Sexual activity: Not Currently    Birth control/protection: Pill  Other Topics Concern   Not on file  Social History Narrative   Not on file   Social Drivers of Health   Tobacco Use: Low Risk (06/19/2024)   Patient History    Smoking Tobacco Use: Never    Smokeless Tobacco Use: Never    Passive Exposure: Not on file  Financial Resource Strain: Not on file  Food Insecurity: Unknown (06/19/2024)   Epic    Worried About Programme Researcher, Broadcasting/film/video in the Last Year: Never true    [employer] of Food in the Last Year: Patient declined  Transportation Needs: No Transportation Needs (06/19/2024)   Epic    Lack of Transportation (  Medical): No    Lack of Transportation (Non-Medical): No  Physical Activity: Not on file  Stress: Not on file  Social Connections: Not on file  Depression (PHQ2-9): Low Risk (12/21/2022)   Depression (PHQ2-9)    PHQ-2 Score: 0  Alcohol Screen: Low Risk (06/19/2024)   Alcohol Screen    Last Alcohol Screening Score (AUDIT): 0  Housing: Low Risk (06/19/2024)   Epic    Unable to Pay for Housing in the Last Year: No    Number of Times Moved in the Last Year: 0    Homeless in the Last Year: No  Utilities:  Not At Risk (06/19/2024)   Epic    Threatened with loss of utilities: No  Health Literacy: Not on file   Past Medical History:  Past Medical History:  Diagnosis Date   Chronic lower extremity pain (2ry area of Pain) (Right) 12/21/2022   Chronic pain syndrome 12/20/2022   DDD (degenerative disc disease), 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 Tobie: Pancolonic diverticulosis, tortuous colon, next colonoscopy 2024   TONSILLECTOMY      Current Medications: Current Facility-Administered Medications  Medication Dose Route Frequency Provider Last Rate Last Admin   acetaminophen  (TYLENOL ) tablet 650 mg  650 mg Oral Q6H PRN Onuoha, Chinwendu V, NP   650 mg at 07/11/24 1835   [START ON 08/02/2024] ARIPiprazole  (ABILIFY ) tablet 30 mg  30 mg Oral Daily Shawanna Zanders, MD       aspirin  EC tablet 81 mg  81 mg Oral Daily Bobbitt, Shalon E, NP   81 mg at 08/01/24 0947   cholecalciferol  (VITAMIN D3) 25 MCG (1000 UNIT) tablet 1,000 Units  1,000 Units Oral Weekly Tayshun Gappa, MD   1,000 Units at 08/01/24 1129   cloZAPine  (CLOZARIL ) tablet 150 mg  150 mg Oral BID Kailia Starry, MD   150 mg at 08/01/24 9056   Or   OLANZapine  (ZYPREXA ) injection 10 mg  10 mg Intramuscular BID Tareva Leske, MD       dapagliflozin  propanediol (FARXIGA ) tablet 10 mg  10 mg Oral Daily Josette Ade, MD   10 mg at 08/01/24 9056   diclofenac  Sodium (VOLTAREN ) 1 % topical gel 4 g  4 g Topical QID PRN Madaram, Kondal R, MD       famotidine  (PEPCID ) tablet 40 mg  40 mg Oral Daily Shrivastava, Aryendra, MD   40 mg at 08/01/24 0946   fluticasone  (FLONASE ) 50 MCG/ACT nasal spray 1 spray  1 spray Each Nare Daily PRN Madaram, Kondal R, MD       furosemide  (LASIX ) tablet 40 mg  40 mg Oral 2 times per day Josette Ade, MD   40 mg at 08/01/24 0751   gabapentin  (NEURONTIN )  capsule 300 mg  300 mg Oral TID Bobbitt, Shalon E, NP   300 mg at 08/01/24 0946   hydrOXYzine  (ATARAX ) tablet 25 mg  25 mg Oral TID PRN Bobbitt, Shalon E, NP   25 mg at 08/01/24 9247   magnesium  hydroxide (MILK OF MAGNESIA) suspension 30 mL  30 mL Oral Daily PRN Bobbitt, Shalon E, NP       metoprolol  succinate (TOPROL -XL) 24 hr tablet 25 mg  25 mg Oral BID Josette Ade, MD   25 mg at 08/01/24 0946   multivitamin with minerals tablet 1 tablet  1 tablet Oral Daily Bobbitt, Shalon E, NP   1 tablet at 08/01/24 0947   naproxen  (NAPROSYN ) tablet 375 mg  375 mg Oral TID WC Keylin Podolsky, MD   375 mg at 08/01/24 1129   OLANZapine  (ZYPREXA ) injection 5 mg  5 mg Intramuscular TID PRN Bobbitt, Shalon E, NP   5 mg at 06/22/24 2259   OLANZapine  zydis (ZYPREXA ) disintegrating tablet 5 mg  5 mg Oral TID PRN Bobbitt, Shalon E, NP   5 mg at 07/28/24 0806   ondansetron  (ZOFRAN ) tablet 4 mg  4 mg Oral Q8H PRN Bobbitt, Shalon E, NP   4 mg at 07/11/24 1835   Oxcarbazepine  (TRILEPTAL ) tablet 300 mg  300 mg Oral BID Rosamund Nyland, MD   300 mg at 08/01/24 9056   potassium chloride  SA (KLOR-CON  M) CR tablet 20 mEq  20 mEq Oral BID Bobbitt, Shalon E, NP   20 mEq at 08/01/24 0946   rosuvastatin  (CRESTOR ) tablet 5 mg  5 mg Oral Daily Bobbitt, Shalon E, NP   5 mg at 08/01/24 0946   traZODone  (DESYREL ) tablet 50 mg  50 mg Oral QHS PRN Bobbitt, Shalon E, NP   50 mg at 07/26/24 2141   zolpidem  (AMBIEN ) tablet 10 mg  10 mg Oral QHS Rebeckah Masih, MD   10 mg at 07/31/24 2119    Lab Results:  Results for orders placed or performed during the hospital encounter of 06/19/24 (from the past 48 hours)  Glucose, capillary     Status: Abnormal   Collection Time: 07/31/24  7:34 AM  Result Value Ref Range   Glucose-Capillary 115 (H) 70 - 99 mg/dL    Comment: Glucose reference range applies only to samples taken after fasting for at least 8 hours.  Glucose, capillary     Status: Abnormal   Collection Time: 08/01/24  7:20 AM   Result Value Ref Range   Glucose-Capillary 129 (H) 70 - 99 mg/dL    Comment: Glucose reference range applies only to samples taken after fasting for at least 8 hours.        Blood Alcohol level:  Lab Results  Component Value Date   [facility] <15 06/18/2024    Metabolic Disorder Labs: Lab Results  Component Value Date   HGBA1C 5.9 (H) 06/28/2024   MPG 122.63 06/28/2024   No results found for: PROLACTIN Lab Results  Component Value Date   CHOL 156 06/28/2024   TRIG 89 06/28/2024   HDL 70 06/28/2024   CHOLHDL 2.2 06/28/2024   VLDL 18 06/28/2024   LDLCALC 69 06/28/2024    Physical Findings: AIMS:  , ,  ,  ,    CIWA:    COWS:      Psychiatric Specialty Exam:  Presentation  General Appearance:  Casual  Eye Contact: Fleeting  Speech: Normal Rate  Speech Volume: Increased    Mood and Affect  Mood: Euphoric  Affect: stable  Thought Process  Thought Processes: Improving illogical  Orientation:Full (Time, Place and Person)  Thought Content:Illogical; Delusions; Tangential  Hallucinations: Denies  Ideas of Reference:Delusions  Suicidal Thoughts: Denies  Homicidal Thoughts: Denies   Sensorium  Memory: Immediate Fair; Remote Fair  Judgment: Impaired  Insight: Shallow   Executive Functions  Concentration: Poor  Attention Span: Poor  Recall: [employer] of Knowledge: Fair  Language: Fair   Psychomotor Activity  Psychomotor Activity: No data recorded  Musculoskeletal: Strength & Muscle Tone: within normal limits Gait & Station: normal Assets  Assets: Communication  Skills; Desire for Improvement    Physical Exam: Physical Exam Vitals and nursing note reviewed.    ROS Blood pressure 130/60, pulse 95, temperature (!) 97.3 F (36.3 C), resp. rate 18, height 5' 5 (1.651 m), weight 87.3 kg, SpO2 100%. Body mass index is 32.03 kg/m.  Diagnosis: Principal Problem:   Bilateral lower extremity edema Active  Problems:   Hypercholesteremia   Schizoaffective disorder, bipolar type (HCC)   Chest pain   Sinus tachycardia   Hypotension   Controlled type 2 diabetes mellitus without complication, without long-term current use of insulin (HCC)   Hyponatremia  Schizoaffective disorder bipolar type  Clinical Decision Making: Patient is currently admitted for worsening psychosis, manic symptoms, tangential grandiosity.  She needs inpatient hospitalization for medication management and close monitoring.  Patient is unable to make decisions on her own as she is unable to appreciate and understand her mental health history, the current need for treatment.  [facility] has screened her case and for further evaluation for legal guardianship. [facility] screened in her case. Sister and mom are involved in her care.  Treatment Plan Summary:  Patient is not responding at all to Clozaril  in spite of increased dose and also poor response to Geodon  which was discontinued and started on Abilify  which was titrated to 20 mg today.  If patient remains manic/psychotic we will discuss a plan to switch Clozaril  to loxapine as patient has documented allergy to Risperdal/paliperidone. Safety and Monitoring:             -- inoluntary admission to inpatient psychiatric unit for safety, stabilization and treatment             -- Daily contact with patient to assess and evaluate symptoms and progress in treatment             -- Patient's case to be discussed in multi-disciplinary team meeting             -- Observation Level: q15 minute checks             -- Vital signs:  q12 hours             -- Precautions: suicide, elopement, and assault   2. Psychiatric Diagnoses and Treatment:  Discontinue Depakote  as patient is noncompliant Discontinue Geodon  20 mg nightly as poor response Increased Clozaril  150mg  twice daily enforced with zyprexa  10mg  BID- Cymbalta  40 mg daily-discontinue to minimize manic Trilepta 300 mg twice daily Abilify  increased  to 30 mg for optimization and transition to LAI Abilify  Maintena -- The risks/benefits/side-effects/alternatives to this medication were discussed in detail with the patient and time was given for questions. The patient consents to medication trial.                -- Metabolic profile and EKG monitoring obtained while on an atypical antipsychotic (BMI: Lipid Panel: HbgA1c: QTc:)              -- Encouraged patient to participate in unit milieu and in scheduled group therapies                            3. Medical Issues Being Addressed:   Chest pain Sinus tachycardia HTN (hypertension) Bilateral lower extremity edema Hypercholesteremia  12/30:  Troponin negative x 2. Echocardiogram done with pending results.  Small improvement in lower extremity edema so continuing p.o. Lasix .  Will need BMP every other day.  4. Discharge Planning:   -- Social  work and case management to assist with discharge planning and identification of hospital follow-up needs prior to discharge  -- Estimated LOS: 3-4 days  Allyn Foil, MD 08/01/2024, 12:07 PM

## 2024-08-01 NOTE — Progress Notes (Signed)
 Patient is complaint with medications denies SI/HI/A/VH and verbally contracts for safety. This shift she did well she had no episodes of loud outburst she interacted well with Peers and Staff attended all programing no medications adverse effects noted. Q 15 minutes safety checks ongoing,.

## 2024-08-01 NOTE — Plan of Care (Signed)
   Problem: Education: Goal: Emotional status will improve Outcome: Progressing Goal: Mental status will improve Outcome: Progressing Goal: Verbalization of understanding the information provided will improve Outcome: Progressing

## 2024-08-01 NOTE — Group Note (Signed)
 Recreation Therapy Group Note   Group Topic:Animal Assisted Therapy   Group Date: 08/01/2024 Start Time: 1030 End Time: 1100 Facilitators: Celestia Jeoffrey BRAVO, LRT, CTRS Location: Dayroom  Group Description: AAA. Animal-Assisted Activity provides opportunities for motivational, educational, therapeutic and/or recreational benefits to enhance quality of life. [name] and [name] visited the unit to interact with patients.   Goal Areas Addressed:  Reduced anxiety and stress Improved mood Increased social interaction Enhanced communication skills Reduced loneliness and isolation Improved emotional regulation   Affect/Mood: Labile   Participation Level: Moderate   Participation Quality: Independent   Behavior: Alert   Speech/Thought Process: Distracted   Insight: Limited   Judgement: Limited   Modes of Intervention: Activity   Patient Response to Interventions:  Receptive   Education Outcome:  In group clarification offered    Clinical Observations/Individualized Feedback: Valerie [name] was mostly active in their participation of session activities and group discussion. Pt was tearful intermittently throughout group.    Plan: Continue to engage patient in RT group sessions 2-3x/week.   Jeoffrey BRAVO Celestia, LRT, CTRS 08/01/2024 11:53 AM

## 2024-08-01 NOTE — Group Note (Signed)
 Date:  08/01/2024 Time:  11:25 PM  Group Topic/Focus:  Wrap-Up Group:   The focus of this group is to help patients review their daily goal of treatment and discuss progress on daily workbooks.    Participation Level:  Active  Participation Quality:  Appropriate  Affect:  Appropriate  Cognitive:  Alert  Insight: Improving  Engagement in Group:  Engaged  Modes of Intervention:  Discussion  Additional Comments:    Valerie [name] Bunker 08/01/2024, 11:25 PM

## 2024-08-01 NOTE — Group Note (Signed)
 Date:  08/01/2024 Time:  4:12 PM  Group Topic/Focus:  Managing Feelings:   The focus of this group is to identify what feelings patients have difficulty handling and develop a plan to handle them in a healthier way upon discharge.  Meditation is excellent for elderly patients as it improves cognitive health, reduces stress, manages chronic pain, boosts mood, enhances sleep, and increases emotional resilience, helping combat age-related issues like memory loss, anxiety, and loneliness by promoting brain plasticity and a sense of calm, making it a simple, adaptable tool for better overall quality of life.   Participation Level:  Active  Participation Quality:  Appropriate  Affect:  Appropriate  Cognitive:  Appropriate  Insight: Appropriate  Engagement in Group:  Engaged  Modes of Intervention:  Activity  Additional Comments:  N/A  [name] 08/01/2024, 4:12 PM

## 2024-08-01 NOTE — Progress Notes (Signed)
" °   08/01/24 2000  Psych Admission Type (Psych Patients Only)  Admission Status Involuntary  Psychosocial Assessment  Patient Complaints Anxiety;Irritability  Eye Contact Fair  Facial Expression Anxious  Affect Irritable;Labile  Speech Tangential  Interaction Assertive  Motor Activity Slow  Appearance/Hygiene In scrubs  Behavior Characteristics Cooperative  Mood Irritable;Labile  Thought Process  Coherency Disorganized  Content Preoccupation  Delusions Paranoid  Perception Hallucinations  Hallucination Auditory  Judgment Impaired  Confusion Mild  Danger to Self  Current suicidal ideation? Denies  Agreement Not to Harm Self Yes  Description of Agreement Verbal  Danger to Others  Danger to Others None reported or observed    "

## 2024-08-01 NOTE — Progress Notes (Signed)
 [facility] MD Progress Note   11:04 PM Valerie [name]  MRN:  [id number]   Valerie [name] is a 64 year old female presenting voluntary to [facility] requesting a psychiatric evaluation for worsening psychosis. Patient has history of schizophrenia. Patient denied SI, HI, psychosis and alcohol/drug usage. Patient resides at [facility]. Patient reports people are coming by my door talking loud, yelling and screaming and coming in my room with keys. Patient reports having multiple medical Afibs and blood clots due to this. Patient reports people are doing things in the dining room. When asked if she called EMS, patient stated I called EMS, I stripped searched over the phone and called the authorities. Patient is admitted to [facility] unit with Q15 min safety monitoring. Multidisciplinary team approach is offered. Medication management; group/milieu therapy is offered.   Sister [name] : [phone]- called on 07/28/24  to update the med changes but went to generic voicemail Subjective:  Chart reviewed, case discussed in multidisciplinary meeting, patient seen during rounds.  Patient is noted to be sitting in a chair.  She remains discharge focused.  She remains delusional.  Nursing describes patient being calm today and redirectable.  She continues to have random episodes of screaming but no physical aggression.  Patient reports the need for discharge and then talks about having somebody waiting for her outside but not specifying where she will be going after discharge as her last placement fell through.  Treatment team is trying to reach out to patient's family for further discussion Past Psychiatric History: see h&P Family History:  Family History  Problem Relation Age of Onset   Colon cancer Neg Hx    Celiac disease Neg Hx    Inflammatory bowel disease Neg Hx    Social History:  Social History   Substance and Sexual Activity  Alcohol Use Never     Social History    Substance and Sexual Activity  Drug Use Never    Social History   Socioeconomic History   Marital status: Single    Spouse name: Not on file   Number of children: Not on file   Years of education: Not on file   Highest education level: Not on file  Occupational History   Not on file  Tobacco Use   Smoking status: Never   Smokeless tobacco: Never  Substance and Sexual Activity   Alcohol use: Never   Drug use: Never   Sexual activity: Not Currently    Birth control/protection: Pill  Other Topics Concern   Not on file  Social History Narrative   Not on file   Social Drivers of Health   Tobacco Use: Low Risk (06/19/2024)   Patient History    Smoking Tobacco Use: Never    Smokeless Tobacco Use: Never    Passive Exposure: Not on file  Financial Resource Strain: Not on file  Food Insecurity: Unknown (06/19/2024)   Epic    Worried About Programme Researcher, Broadcasting/film/video in the Last Year: Never true    [employer] of Food in the Last Year: Patient declined  Transportation Needs: No Transportation Needs (06/19/2024)   Epic    Lack of Transportation (Medical): No    Lack of Transportation (Non-Medical): No  Physical Activity: Not on file  Stress: Not on file  Social Connections: Not on file  Depression (PHQ2-9): Low Risk (12/21/2022)   Depression (PHQ2-9)    PHQ-2 Score: 0  Alcohol Screen: Low Risk (06/19/2024)   Alcohol Screen  Last Alcohol Screening Score (AUDIT): 0  Housing: Low Risk (06/19/2024)   Epic    Unable to Pay for Housing in the Last Year: No    Number of Times Moved in the Last Year: 0    Homeless in the Last Year: No  Utilities: Not At Risk (06/19/2024)   Epic    Threatened with loss of utilities: No  Health Literacy: Not on file   Past Medical History:  Past Medical History:  Diagnosis Date   Chronic lower extremity pain (2ry area of Pain) (Right) 12/21/2022   Chronic pain syndrome 12/20/2022   DDD (degenerative disc disease), 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 Tobie: Pancolonic diverticulosis, tortuous colon, next colonoscopy 2024   TONSILLECTOMY      Current Medications: Current Facility-Administered Medications  Medication Dose Route Frequency Provider Last Rate Last Admin   acetaminophen  (TYLENOL ) tablet 650 mg  650 mg Oral Q6H PRN Onuoha, Chinwendu V, NP   650 mg at 07/11/24 1835   [START ON 08/02/2024] ARIPiprazole  (ABILIFY ) tablet 30 mg  30 mg Oral Daily Jarius Dieudonne, MD       aspirin  EC tablet 81 mg  81 mg Oral Daily Bobbitt, Shalon E, NP   81 mg at 08/01/24 0947   cholecalciferol  (VITAMIN D3) 25 MCG (1000 UNIT) tablet 1,000 Units  1,000 Units Oral Weekly Johnavon Mcclafferty, MD   1,000 Units at 08/01/24 1129   cloZAPine  (CLOZARIL ) tablet 150 mg  150 mg Oral BID Marycatherine Maniscalco, MD   150 mg at 08/01/24 2123   Or   OLANZapine  (ZYPREXA ) injection 10 mg  10 mg Intramuscular BID Sharlyne Koeneman, MD       dapagliflozin  propanediol (FARXIGA ) tablet 10 mg  10 mg Oral Daily Josette Ade, MD   10 mg at 08/01/24 9056   diclofenac  Sodium (VOLTAREN ) 1 % topical gel 4 g  4 g Topical QID PRN Madaram, Kondal R, MD       famotidine  (PEPCID ) tablet 40 mg  40 mg Oral Daily Shrivastava, Aryendra, MD   40 mg at 08/01/24 0946   fluticasone  (FLONASE ) 50 MCG/ACT nasal spray 1 spray  1 spray Each Nare Daily PRN Madaram, Kondal R, MD       furosemide  (LASIX ) tablet 40 mg  40 mg Oral 2 times per day Josette Ade, MD   40 mg at 08/01/24 1600   gabapentin  (NEURONTIN ) capsule 300 mg  300 mg Oral TID Bobbitt, Shalon E, NP   300 mg at 08/01/24 2124   hydrOXYzine  (ATARAX ) tablet 25 mg  25 mg Oral TID PRN Bobbitt, Shalon E, NP   25 mg at 08/01/24 9247   magnesium  hydroxide (MILK OF MAGNESIA) suspension 30 mL  30 mL Oral Daily PRN Bobbitt, Shalon E, NP       metoprolol  succinate (TOPROL -XL) 24 hr tablet 25 mg   25 mg Oral BID Josette Ade, MD   25 mg at 08/01/24 0946   multivitamin with minerals tablet 1 tablet  1 tablet Oral Daily Bobbitt, Shalon E, NP   1 tablet at 08/01/24 9052   naproxen  (NAPROSYN ) tablet 375 mg  375 mg Oral TID WC Njeri Vicente, MD   375 mg at 08/01/24 1129   OLANZapine  (ZYPREXA ) injection 5 mg  5 mg Intramuscular TID PRN Bobbitt, Shalon E, NP  5 mg at 06/22/24 2259   OLANZapine  zydis (ZYPREXA ) disintegrating tablet 5 mg  5 mg Oral TID PRN Bobbitt, Shalon E, NP   5 mg at 07/28/24 9193   ondansetron  (ZOFRAN ) tablet 4 mg  4 mg Oral Q8H PRN Bobbitt, Shalon E, NP   4 mg at 07/11/24 1835   Oxcarbazepine  (TRILEPTAL ) tablet 300 mg  300 mg Oral BID Ines Rebel, MD   300 mg at 08/01/24 2124   potassium chloride  SA (KLOR-CON  M) CR tablet 20 mEq  20 mEq Oral BID Bobbitt, Shalon E, NP   20 mEq at 08/01/24 2123   rosuvastatin  (CRESTOR ) tablet 5 mg  5 mg Oral Daily Bobbitt, Shalon E, NP   5 mg at 08/01/24 9053   traZODone  (DESYREL ) tablet 50 mg  50 mg Oral QHS PRN Bobbitt, Shalon E, NP   50 mg at 07/26/24 2141   zolpidem  (AMBIEN ) tablet 10 mg  10 mg Oral QHS Oriel Ojo, MD   10 mg at 08/01/24 2123    Lab Results:  Results for orders placed or performed during the hospital encounter of 06/19/24 (from the past 48 hours)  Glucose, capillary     Status: Abnormal   Collection Time: 07/31/24  7:34 AM  Result Value Ref Range   Glucose-Capillary 115 (H) 70 - 99 mg/dL    Comment: Glucose reference range applies only to samples taken after fasting for at least 8 hours.  Glucose, capillary     Status: Abnormal   Collection Time: 08/01/24  7:20 AM  Result Value Ref Range   Glucose-Capillary 129 (H) 70 - 99 mg/dL    Comment: Glucose reference range applies only to samples taken after fasting for at least 8 hours.        Blood Alcohol level:  Lab Results  Component Value Date   [facility] <15 06/18/2024    Metabolic Disorder Labs: Lab Results  Component Value Date   HGBA1C 5.9 (H)  06/28/2024   MPG 122.63 06/28/2024   No results found for: PROLACTIN Lab Results  Component Value Date   CHOL 156 06/28/2024   TRIG 89 06/28/2024   HDL 70 06/28/2024   CHOLHDL 2.2 06/28/2024   VLDL 18 06/28/2024   LDLCALC 69 06/28/2024    Physical Findings: AIMS:  , ,  ,  ,    CIWA:    COWS:      Psychiatric Specialty Exam:  Presentation  General Appearance:  Casual  Eye Contact: Fleeting  Speech: Normal Rate  Speech Volume: Increased    Mood and Affect  Mood: Euphoric  Affect: stable  Thought Process  Thought Processes: Improving illogical  Orientation:Full (Time, Place and Person)  Thought Content:Illogical; Delusions; Tangential  Hallucinations: Denies  Ideas of Reference:Delusions  Suicidal Thoughts: Denies  Homicidal Thoughts: Denies   Sensorium  Memory: Immediate Fair; Remote Fair  Judgment: Impaired  Insight: Shallow   Executive Functions  Concentration: Poor  Attention Span: Poor  Recall: [employer] of Knowledge: Fair  Language: Fair   Psychomotor Activity  Psychomotor Activity: No data recorded  Musculoskeletal: Strength & Muscle Tone: within normal limits Gait & Station: normal Assets  Assets: Manufacturing Systems Engineer; Desire for Improvement    Physical Exam: Physical Exam Vitals and nursing note reviewed.    ROS Blood pressure (!) 112/41, pulse 98, temperature 97.9 F (36.6 C), resp. rate 14, height 5' 5 (1.651 m), weight 87.3 kg, SpO2 100%. Body mass index is 32.03 kg/m.  Diagnosis: Principal Problem:   Bilateral  lower extremity edema Active Problems:   Hypercholesteremia   Schizoaffective disorder, bipolar type (HCC)   Chest pain   Sinus tachycardia   Hypotension   Controlled type 2 diabetes mellitus without complication, without long-term current use of insulin (HCC)   Hyponatremia  Schizoaffective disorder bipolar type  Clinical Decision Making: Patient is currently admitted for  worsening psychosis, manic symptoms, tangential grandiosity.  She needs inpatient hospitalization for medication management and close monitoring.  Patient is unable to make decisions on her own as she is unable to appreciate and understand her mental health history, the current need for treatment.  [facility] has screened her case and for further evaluation for legal guardianship. [facility] screened in her case. Sister and mom are involved in her care.  Treatment Plan Summary:  Patient is not responding at all to Clozaril  in spite of increased dose and also poor response to Geodon  which was discontinued and started on Abilify  which was titrated to 20 mg today.  If patient remains manic/psychotic we will discuss a plan to switch Clozaril  to loxapine as patient has documented allergy to Risperdal/paliperidone. Safety and Monitoring:             -- inoluntary admission to inpatient psychiatric unit for safety, stabilization and treatment             -- Daily contact with patient to assess and evaluate symptoms and progress in treatment             -- Patient's case to be discussed in multi-disciplinary team meeting             -- Observation Level: q15 minute checks             -- Vital signs:  q12 hours             -- Precautions: suicide, elopement, and assault   2. Psychiatric Diagnoses and Treatment:  Discontinue Depakote  as patient is noncompliant Discontinue Geodon  20 mg nightly as poor response Increased Clozaril  150mg  twice daily enforced with zyprexa  10mg  BID- Cymbalta  40 mg daily-discontinue to minimize manic Trilepta 300 mg twice daily Abilify  increased to 30 mg for optimization and transition to LAI Abilify  Maintena -- The risks/benefits/side-effects/alternatives to this medication were discussed in detail with the patient and time was given for questions. The patient consents to medication trial.                -- Metabolic profile and EKG monitoring obtained while on an atypical antipsychotic (BMI:  Lipid Panel: HbgA1c: QTc:)              -- Encouraged patient to participate in unit milieu and in scheduled group therapies                            3. Medical Issues Being Addressed:   Chest pain Sinus tachycardia HTN (hypertension) Bilateral lower extremity edema Hypercholesteremia  12/30:  Troponin negative x 2. Echocardiogram done with pending results.  Small improvement in lower extremity edema so continuing p.o. Lasix .  Will need BMP every other day.  4. Discharge Planning:   -- Social work and case management to assist with discharge planning and identification of hospital follow-up needs prior to discharge  -- Estimated LOS: 3-4 days  Silva Aamodt, MD 08/01/2024, 11:04 PM

## 2024-08-02 DIAGNOSIS — F250 Schizoaffective disorder, bipolar type: Secondary | ICD-10-CM | POA: Diagnosis not present

## 2024-08-02 LAB — GLUCOSE, CAPILLARY: Glucose-Capillary: 145 mg/dL — ABNORMAL HIGH (ref 70–99)

## 2024-08-02 NOTE — Group Note (Signed)
 Date:  08/02/2024 Time:  11:56 AM  Group Topic/Focus:  Group Exercise    Participation Level:  Minimal  Participation Quality:  Appropriate  Affect:  Appropriate  Cognitive:  Appropriate  Insight: Appropriate  Engagement in Group:  Improving  Modes of Intervention:  Activity  Additional Comments:  pt attended group  Valerie [name] E Valerie [name] 08/02/2024, 11:56 AM

## 2024-08-02 NOTE — Progress Notes (Signed)
" °   08/02/24 1200  Psych Admission Type (Psych Patients Only)  Admission Status Involuntary  Psychosocial Assessment  Patient Complaints Anxiety  Eye Contact Fair  Facial Expression Animated;Anxious  Affect Labile  Speech Tangential  Interaction Assertive  Motor Activity Slow  Appearance/Hygiene In scrubs  Behavior Characteristics Appropriate to situation  Mood Labile  Thought Process  Coherency Disorganized  Content Preoccupation  Delusions Paranoid  Perception Hallucinations  Hallucination Auditory  Judgment Impaired  Confusion Mild  Danger to Self  Current suicidal ideation? Denies  Danger to Others  Danger to Others None reported or observed    "

## 2024-08-02 NOTE — Progress Notes (Signed)
 Supportive listening given to patient.

## 2024-08-02 NOTE — Plan of Care (Signed)
   Problem: Education: Goal: Emotional status will improve Outcome: Progressing Goal: Mental status will improve Outcome: Progressing   Problem: Activity: Goal: Interest or engagement in activities will improve Outcome: Progressing

## 2024-08-02 NOTE — Group Note (Signed)
 Date:  08/02/2024 Time:  9:01 PM  Group Topic/Focus:  Self Care:   The focus of this group is to help patients understand the importance of self-care in order to improve or restore emotional, physical, spiritual, interpersonal, and financial health.    Participation Level:  Active  Participation Quality:  Appropriate  Affect:  Appropriate  Cognitive:  Appropriate  Insight: Appropriate  Engagement in Group:  Engaged  Modes of Intervention:  Discussion  Additional Comments:    Valerie [name] 08/02/2024, 9:01 PM

## 2024-08-02 NOTE — Plan of Care (Signed)
   Problem: Activity: Goal: Interest or engagement in activities will improve Outcome: Progressing Goal: Sleeping patterns will improve Outcome: Progressing

## 2024-08-02 NOTE — [facility] Group Notes (Signed)
 Spirituality Group   Group Goal: Support / Education around grief and loss   Group Description: Following introductions and group rules, group members engaged in facilitated group dialog and support around topic of loss, with particular support around experiences of loss in their lives. Group members identified types of loss (relationships / self / things) as well as patterns, circumstances, and changes that precipitate loss. Reflection invited on thoughts / feelings around loss, normalized grief responses, and recognized variety in grief experience. Group noted Worden's four tasks of grief in discussion. Group drew on Adlerian / Rogerian, narrative, MI, with Yaloms group therapy as a primary framework.   Observations: Valerie [name] attended almost 0.5 of group but was restless and frequently disruptive. Not able to participate.  Javante Nilsson L. Delores HERO.Div

## 2024-08-02 NOTE — Progress Notes (Signed)
 RN heard patient screaming again, RN went into room to assess the situation. Patient sitting on the edge of the bed with her eyes closed and nude. Patient advised to clothe herself.

## 2024-08-02 NOTE — Progress Notes (Signed)
" °   08/02/24 2000  Psych Admission Type (Psych Patients Only)  Admission Status Involuntary  Psychosocial Assessment  Patient Complaints Anxiety  Eye Contact Fair  Facial Expression Anxious  Affect Labile  Speech Tangential  Interaction Assertive  Motor Activity Slow  Appearance/Hygiene Unremarkable  Behavior Characteristics Appropriate to situation  Mood Labile  Thought Process  Coherency Disorganized  Content Preoccupation  Delusions Paranoid  Perception Hallucinations  Hallucination Auditory  Judgment Impaired  Confusion Mild  Danger to Self  Current suicidal ideation? Denies  Agreement Not to Harm Self Yes  Description of Agreement Verbal  Danger to Others  Danger to Others None reported or observed    "

## 2024-08-02 NOTE — Progress Notes (Signed)
 Patient heard screaming from her room, this RN went to assess the situation, and patient was seen rolling around in her bed with her eyes closed screaming help me, help me RN attempted to get patient to open her eyes and reorient her, but patient said no do not look at me. This RN tried to continue to talk to patient, but patient started patient snoring sounds and rolled over with her back facing RN

## 2024-08-03 DIAGNOSIS — F250 Schizoaffective disorder, bipolar type: Secondary | ICD-10-CM | POA: Diagnosis not present

## 2024-08-03 LAB — CBC WITH DIFFERENTIAL/PLATELET
Abs Immature Granulocytes: 0.04 K/uL (ref 0.00–0.07)
Basophils Absolute: 0.1 K/uL (ref 0.0–0.1)
Basophils Relative: 1 %
Eosinophils Absolute: 0.4 K/uL (ref 0.0–0.5)
Eosinophils Relative: 6 %
HCT: 38.1 % (ref 36.0–46.0)
Hemoglobin: 12 g/dL (ref 12.0–15.0)
Immature Granulocytes: 1 %
Lymphocytes Relative: 42 %
Lymphs Abs: 3.1 K/uL (ref 0.7–4.0)
MCH: 26.8 pg (ref 26.0–34.0)
MCHC: 31.5 g/dL (ref 30.0–36.0)
MCV: 85.2 fL (ref 80.0–100.0)
Monocytes Absolute: 0.8 K/uL (ref 0.1–1.0)
Monocytes Relative: 10 %
Neutro Abs: 2.9 K/uL (ref 1.7–7.7)
Neutrophils Relative %: 40 %
Platelets: 355 K/uL (ref 150–400)
RBC: 4.47 MIL/uL (ref 3.87–5.11)
RDW: 13.9 % (ref 11.5–15.5)
WBC: 7.3 K/uL (ref 4.0–10.5)
nRBC: 0 % (ref 0.0–0.2)

## 2024-08-03 LAB — BASIC METABOLIC PANEL WITH GFR
Anion gap: 10 (ref 5–15)
BUN: 12 mg/dL (ref 8–23)
CO2: 28 mmol/L (ref 22–32)
Calcium: 9.5 mg/dL (ref 8.9–10.3)
Chloride: 97 mmol/L — ABNORMAL LOW (ref 98–111)
Creatinine, Ser: 0.59 mg/dL (ref 0.44–1.00)
GFR, Estimated: >60 mL/min
Glucose, Bld: 125 mg/dL — ABNORMAL HIGH (ref 70–99)
Potassium: 4.1 mmol/L (ref 3.5–5.1)
Sodium: 134 mmol/L — ABNORMAL LOW (ref 135–145)

## 2024-08-03 LAB — GLUCOSE, CAPILLARY: Glucose-Capillary: 119 mg/dL — ABNORMAL HIGH (ref 70–99)

## 2024-08-03 NOTE — Progress Notes (Signed)
" °   08/03/24 9380  15 Minute Checks  Location Bedroom  Visual Appearance Calm  Behavior Sleeping  Sleep (Behavioral Health Patients Only)  Calculate sleep? (Click Yes once per 24 hr at 0600 safety check) Yes  OTHER  Documented sleep last 24 hours 8    "

## 2024-08-03 NOTE — Group Note (Signed)
 Recreation Therapy Group Note   Group Topic:Coping Skills  Group Date: 08/03/2024 Start Time: 1400 End Time: 1430 Facilitators: Celestia Jeoffrey BRAVO, LRT, CTRS Location: Dayroom  Group Description: Mind Map.  Patient was provided a blank template of a diagram with 32 blank boxes in a tiered system, branching from the center (similar to a bubble chart). LRT directed patients to label the middle of the diagram Coping Skills. LRT and patients then came up with 8 different coping skills as examples. Pt were directed to record their coping skills in the 2nd tier boxes closest to the center.  Patients would then share their coping skills with the group as LRT wrote them out. LRT gave a handout of 99 different coping skills at the end of group.   Goal Area(s) Addressed: Patients will be able to define coping skills. Patient will identify new coping skills.  Patient will increase communication.   Affect/Mood: N/A   Participation Level: Did not attend    Clinical Observations/Individualized Feedback: Patient came to group with less than 5 minutes remaining.   Plan: Continue to engage patient in RT group sessions 2-3x/week.   Jeoffrey BRAVO Celestia, LRT, CTRS 08/03/2024 4:24 PM

## 2024-08-03 NOTE — Group Note (Signed)
 Date:  08/03/2024 Time:  9:35 PM  Group Topic/Focus:  Goals Group:   The focus of this group is to help patients establish daily goals to achieve during treatment and discuss how the patient can incorporate goal setting into their daily lives to aide in recovery.    Participation Level:  Active  Participation Quality:  Appropriate  Affect:  Defensive  Cognitive:  Appropriate  Insight: Good  Engagement in Group:  Engaged  Modes of Intervention:  Activity  Additional Comments:    Jerri Glauser C Azarius Lambson 08/03/2024, 9:35 PM

## 2024-08-03 NOTE — Plan of Care (Signed)
  Problem: Activity: Goal: Interest or engagement in activities will improve Outcome: Progressing   Problem: Education: Goal: Mental status will improve Outcome: Not Progressing Goal: Verbalization of understanding the information provided will improve Outcome: Not Progressing   

## 2024-08-03 NOTE — Group Note (Signed)
 [facility] LCSW Group Therapy Note   Group Date: 08/03/2024 Start Time: 1315 End Time: 1345   Type of Therapy/Topic:  Group Therapy:  Emotion Regulation  Participation Level:  Active   Mood:  Description of Group:    The purpose of this group is to assist patients in learning to regulate negative emotions and experience positive emotions. Patients will be guided to discuss ways in which they have been vulnerable to their negative emotions. These vulnerabilities will be juxtaposed with experiences of positive emotions or situations, and patients challenged to use positive emotions to combat negative ones. Special emphasis will be placed on coping with negative emotions in conflict situations, and patients will process healthy conflict resolution skills.  Therapeutic Goals: Patient will identify two positive emotions or experiences to reflect on in order to balance out negative emotions:  Patient will label two or more emotions that they find the most difficult to experience:  Patient will be able to demonstrate positive conflict resolution skills through discussion or role plays:   Summary of Patient Progress:   Pt active and appropriate throughout group. Open to feedback from peers. Pt exited group about halfway through because pt was having difficulty regulating emotions       Therapeutic Modalities:   Cognitive Behavioral Therapy Feelings Identification Dialectical Behavioral Therapy   Lum JONETTA Croft, LCSWA

## 2024-08-03 NOTE — Group Note (Signed)
 Date:  08/03/2024 Time:  10:48 AM  Group Topic/Focus:   Effective exercises for geriatric patients focus on improving balance, strength, flexibility, and aerobic fitness, crucial for preventing falls and maintaining independence; key activities include walking, chair yoga/stretching, strength training with bands or light weights (like calf raises, wall push-ups, sit-to-stands), and balance drills (like single-leg stands and heel-to-toe walking), always with a doctor's approval.    Participation Level:  Did Not Attend   Valerie [name] 08/03/2024, 10:48 AM

## 2024-08-03 NOTE — Group Note (Unsigned)
 Date:  08/03/2024 Time:  12:17 PM  Group Topic/Focus:    group   Participation Level:  {B[facility] PARTICIPATION OZCZO:77735}  Participation Quality:  {B[facility] PARTICIPATION QUALITY:22265}  Affect:  {B[facility] AFFECT:22266}  Cognitive:  {B[facility] COGNITIVE:22267}  Insight: {BHH Insight2:20797}  Engagement in Group:  {BHH ENGAGEMENT IN HMNLE:77731}  Modes of Intervention:  {BHH MODES OF INTERVENTION:22269}  Additional Comments:  ***  [name] 08/03/2024, 12:17 PM

## 2024-08-03 NOTE — Plan of Care (Signed)
   Problem: Education: Goal: Emotional status will improve Outcome: Progressing Goal: Mental status will improve Outcome: Progressing   Problem: Activity: Goal: Interest or engagement in activities will improve Outcome: Progressing

## 2024-08-03 NOTE — Progress Notes (Signed)
 [facility] MD Progress Note   12:01 AM Valerie [name]  MRN:  [id number]   Valerie [name] is a 64 year old female presenting voluntary to [facility] requesting a psychiatric evaluation for worsening psychosis. Patient has history of schizophrenia. Patient denied SI, HI, psychosis and alcohol/drug usage. Patient resides at [facility]. Patient reports people are coming by my door talking loud, yelling and screaming and coming in my room with keys. Patient reports having multiple medical Afibs and blood clots due to this. Patient reports people are doing things in the dining room. When asked if she called EMS, patient stated I called EMS, I stripped searched over the phone and called the authorities. Patient is admitted to [facility] unit with Q15 min safety monitoring. Multidisciplinary team approach is offered. Medication management; group/milieu therapy is offered.   Sister Erum Cercone : [phone]- called on 07/28/24  to update the med changes but went to generic voicemail Subjective:  Chart reviewed, case discussed in multidisciplinary meeting, patient seen during rounds.  Patient is noted to be pacing on the unit.  She is redirectable.  She greets the provider and informs the provider that she needs to leave.  She continues to talk about having external arrangements and that she is chosen by some people.  Patient is redirectable when provider informed her that her family is trying to look for a new placement for her.  She is taking her medications and remains fixated on Geodon  in spite of being educated that she is on Abilify  now.  Patient is tolerating medications with no reported side effects.  She remains delusional at baseline with poor boundaries and disinhibited behavior. Past Psychiatric History: see h&P Family History:  Family History  Problem Relation Age of Onset   Colon cancer Neg Hx    Celiac disease Neg Hx    Inflammatory bowel disease Neg Hx    Social  History:  Social History   Substance and Sexual Activity  Alcohol Use Never     Social History   Substance and Sexual Activity  Drug Use Never    Social History   Socioeconomic History   Marital status: Single    Spouse name: Not on file   Number of children: Not on file   Years of education: Not on file   Highest education level: Not on file  Occupational History   Not on file  Tobacco Use   Smoking status: Never   Smokeless tobacco: Never  Substance and Sexual Activity   Alcohol use: Never   Drug use: Never   Sexual activity: Not Currently    Birth control/protection: Pill  Other Topics Concern   Not on file  Social History Narrative   Not on file   Social Drivers of Health   Tobacco Use: Low Risk (06/19/2024)   Patient History    Smoking Tobacco Use: Never    Smokeless Tobacco Use: Never    Passive Exposure: Not on file  Financial Resource Strain: Not on file  Food Insecurity: Unknown (06/19/2024)   Epic    Worried About Programme Researcher, Broadcasting/film/video in the Last Year: Never true    [employer] of Food in the Last Year: Patient declined  Transportation Needs: No Transportation Needs (06/19/2024)   Epic    Lack of Transportation (Medical): No    Lack of Transportation (Non-Medical): No  Physical Activity: Not on file  Stress: Not on file  Social Connections: Not on file  Depression (EYV7-0): Low Risk (  12/21/2022)   Depression (PHQ2-9)    PHQ-2 Score: 0  Alcohol Screen: Low Risk (06/19/2024)   Alcohol Screen    Last Alcohol Screening Score (AUDIT): 0  Housing: Low Risk (06/19/2024)   Epic    Unable to Pay for Housing in the Last Year: No    Number of Times Moved in the Last Year: 0    Homeless in the Last Year: No  Utilities: Not At Risk (06/19/2024)   Epic    Threatened with loss of utilities: No  Health Literacy: Not on file   Past Medical History:  Past Medical History:  Diagnosis Date   Chronic lower extremity pain (2ry area of Pain) (Right) 12/21/2022    Chronic pain syndrome 12/20/2022   DDD (degenerative disc disease), 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 Tobie: Pancolonic diverticulosis, tortuous colon, next colonoscopy 2024   TONSILLECTOMY      Current Medications: Current Facility-Administered Medications  Medication Dose Route Frequency Provider Last Rate Last Admin   acetaminophen  (TYLENOL ) tablet 650 mg  650 mg Oral Q6H PRN Onuoha, Chinwendu V, NP   650 mg at 07/11/24 1835   ARIPiprazole  (ABILIFY ) tablet 30 mg  30 mg Oral Daily Jayda White, MD   30 mg at 08/02/24 9040   aspirin  EC tablet 81 mg  81 mg Oral Daily Bobbitt, Shalon E, NP   81 mg at 08/02/24 0959   cholecalciferol  (VITAMIN D3) 25 MCG (1000 UNIT) tablet 1,000 Units  1,000 Units Oral Weekly Chet Greenley, MD   1,000 Units at 08/01/24 1129   cloZAPine  (CLOZARIL ) tablet 150 mg  150 mg Oral BID Mildred Tuccillo, MD   150 mg at 08/02/24 2107   Or   OLANZapine  (ZYPREXA ) injection 10 mg  10 mg Intramuscular BID Alexandre Lightsey, MD       dapagliflozin  propanediol (FARXIGA ) tablet 10 mg  10 mg Oral Daily Josette Ade, MD   10 mg at 08/02/24 9040   diclofenac  Sodium (VOLTAREN ) 1 % topical gel 4 g  4 g Topical QID PRN Madaram, Kondal R, MD       famotidine  (PEPCID ) tablet 40 mg  40 mg Oral Daily Shrivastava, Aryendra, MD   40 mg at 08/02/24 0959   fluticasone  (FLONASE ) 50 MCG/ACT nasal spray 1 spray  1 spray Each Nare Daily PRN Madaram, Kondal R, MD       furosemide  (LASIX ) tablet 40 mg  40 mg Oral 2 times per day Josette Ade, MD   40 mg at 08/02/24 1500   gabapentin  (NEURONTIN ) capsule 300 mg  300 mg Oral TID Bobbitt, Shalon E, NP   300 mg at 08/02/24 2107   hydrOXYzine  (ATARAX ) tablet 25 mg  25 mg Oral TID PRN Bobbitt, Shalon E, NP   25 mg at 08/02/24 0155   magnesium  hydroxide (MILK OF MAGNESIA) suspension 30 mL  30  mL Oral Daily PRN Bobbitt, Shalon E, NP       metoprolol  succinate (TOPROL -XL) 24 hr tablet 25 mg  25 mg Oral BID Josette Ade, MD   25 mg at 08/02/24 1000   multivitamin with minerals tablet 1 tablet  1 tablet Oral Daily Bobbitt, Shalon E, NP   1 tablet at 08/02/24 0959   naproxen  (NAPROSYN ) tablet 375 mg  375 mg Oral TID WC Ford Peddie, MD  375 mg at 08/02/24 1710   OLANZapine  (ZYPREXA ) injection 5 mg  5 mg Intramuscular TID PRN Bobbitt, Shalon E, NP   5 mg at 06/22/24 2259   OLANZapine  zydis (ZYPREXA ) disintegrating tablet 5 mg  5 mg Oral TID PRN Bobbitt, Shalon E, NP   5 mg at 08/02/24 0155   ondansetron  (ZOFRAN ) tablet 4 mg  4 mg Oral Q8H PRN Bobbitt, Shalon E, NP   4 mg at 07/11/24 1835   Oxcarbazepine  (TRILEPTAL ) tablet 300 mg  300 mg Oral BID Aprile Dickenson, MD   300 mg at 08/02/24 2107   potassium chloride  SA (KLOR-CON  M) CR tablet 20 mEq  20 mEq Oral BID Bobbitt, Shalon E, NP   20 mEq at 08/02/24 2110   rosuvastatin  (CRESTOR ) tablet 5 mg  5 mg Oral Daily Bobbitt, Shalon E, NP   5 mg at 08/02/24 9041   traZODone  (DESYREL ) tablet 50 mg  50 mg Oral QHS PRN Bobbitt, Shalon E, NP   50 mg at 08/02/24 2108   zolpidem  (AMBIEN ) tablet 10 mg  10 mg Oral QHS Monserrat Vidaurri, MD   10 mg at 08/02/24 2108    Lab Results:  Results for orders placed or performed during the hospital encounter of 06/19/24 (from the past 48 hours)  Glucose, capillary     Status: Abnormal   Collection Time: 08/01/24  7:20 AM  Result Value Ref Range   Glucose-Capillary 129 (H) 70 - 99 mg/dL    Comment: Glucose reference range applies only to samples taken after fasting for at least 8 hours.  Glucose, capillary     Status: Abnormal   Collection Time: 08/02/24  7:46 AM  Result Value Ref Range   Glucose-Capillary 145 (H) 70 - 99 mg/dL    Comment: Glucose reference range applies only to samples taken after fasting for at least 8 hours.        Blood Alcohol level:  Lab Results  Component Value Date   [facility]  <15 06/18/2024    Metabolic Disorder Labs: Lab Results  Component Value Date   HGBA1C 5.9 (H) 06/28/2024   MPG 122.63 06/28/2024   No results found for: PROLACTIN Lab Results  Component Value Date   CHOL 156 06/28/2024   TRIG 89 06/28/2024   HDL 70 06/28/2024   CHOLHDL 2.2 06/28/2024   VLDL 18 06/28/2024   LDLCALC 69 06/28/2024    Physical Findings: AIMS:  , ,  ,  ,    CIWA:    COWS:      Psychiatric Specialty Exam:  Presentation  General Appearance:  Casual  Eye Contact: Fleeting  Speech: Normal Rate  Speech Volume: Increased    Mood and Affect  Mood: Euphoric  Affect: stable  Thought Process  Thought Processes: Improving illogical  Orientation:Full (Time, Place and Person)  Thought Content:Illogical; Delusions; Tangential  Hallucinations: Denies  Ideas of Reference:Delusions  Suicidal Thoughts: Denies  Homicidal Thoughts: Denies   Sensorium  Memory: Immediate Fair; Remote Fair  Judgment: Impaired  Insight: Shallow   Executive Functions  Concentration: Poor  Attention Span: Poor  Recall: [employer] of Knowledge: Fair  Language: Fair   Psychomotor Activity  Psychomotor Activity: No data recorded  Musculoskeletal: Strength & Muscle Tone: within normal limits Gait & Station: normal Assets  Assets: Manufacturing Systems Engineer; Desire for Improvement    Physical Exam: Physical Exam Vitals and nursing note reviewed.    ROS Blood pressure (!) 113/55, pulse (!) 59, temperature (!) 97.2 F (36.2 C),  resp. rate 14, height 5' 5 (1.651 m), weight 87.3 kg, SpO2 100%. Body mass index is 32.03 kg/m.  Diagnosis: Principal Problem:   Bilateral lower extremity edema Active Problems:   Hypercholesteremia   Schizoaffective disorder, bipolar type (HCC)   Chest pain   Sinus tachycardia   Hypotension   Controlled type 2 diabetes mellitus without complication, without long-term current use of insulin (HCC)    Hyponatremia  Schizoaffective disorder bipolar type  Clinical Decision Making: Patient is currently admitted for worsening psychosis, manic symptoms, tangential grandiosity.  She needs inpatient hospitalization for medication management and close monitoring.  Patient is unable to make decisions on her own as she is unable to appreciate and understand her mental health history, the current need for treatment.  [facility] has screened her case and for further evaluation for legal guardianship. [facility] screened in her case. Sister and mom are involved in her care.  Treatment Plan Summary:  Patient is not responding at all to Clozaril  in spite of increased dose and also poor response to Geodon  which was discontinued and started on Abilify  which was titrated to 20 mg today.  If patient remains manic/psychotic we will discuss a plan to switch Clozaril  to loxapine as patient has documented allergy to Risperdal/paliperidone. Safety and Monitoring:             -- inoluntary admission to inpatient psychiatric unit for safety, stabilization and treatment             -- Daily contact with patient to assess and evaluate symptoms and progress in treatment             -- Patient's case to be discussed in multi-disciplinary team meeting             -- Observation Level: q15 minute checks             -- Vital signs:  q12 hours             -- Precautions: suicide, elopement, and assault   2. Psychiatric Diagnoses and Treatment:  Discontinue Depakote  as patient is noncompliant Discontinue Geodon  20 mg nightly as poor response Increased Clozaril  150mg  twice daily enforced with zyprexa  10mg  BID- Cymbalta  40 mg daily-discontinue to minimize manic Trilepta 300 mg twice daily Abilify  increased to 30 mg for optimization and transition to LAI Abilify  Maintena -- The risks/benefits/side-effects/alternatives to this medication were discussed in detail with the patient and time was given for questions. The patient consents to  medication trial.                -- Metabolic profile and EKG monitoring obtained while on an atypical antipsychotic (BMI: Lipid Panel: HbgA1c: QTc:)              -- Encouraged patient to participate in unit milieu and in scheduled group therapies                            3. Medical Issues Being Addressed:   Chest pain Sinus tachycardia HTN (hypertension) Bilateral lower extremity edema Hypercholesteremia  12/30:  Troponin negative x 2. Echocardiogram done with pending results.  Small improvement in lower extremity edema so continuing p.o. Lasix .  Will need BMP every other day.  4. Discharge Planning:   -- Social work and case management to assist with discharge planning and identification of hospital follow-up needs prior to discharge  -- Estimated LOS: 3-4 days  Allyn Foil, MD 08/03/2024, 12:01 AM

## 2024-08-03 NOTE — Progress Notes (Signed)
 Behavior:  Labile.  Disruptive in milieu.  Demanding of staff.   Psych assessment:  Denies SI/HI.   Group attendance:  1/3  Medication/ PRNs:  Refused Abilify .  Dr. JINNY made aware.  Pain:  Denies.    15 min checks in place for safety.

## 2024-08-03 NOTE — Progress Notes (Signed)
 [facility] MD Progress Note   12:25 PM Valerie [name]  MRN:  [id number]   Valerie [name] is a 63 year old female presenting voluntary to [facility] requesting a psychiatric evaluation for worsening psychosis. Patient has history of schizophrenia. Patient denied SI, HI, psychosis and alcohol/drug usage. Patient resides at [facility]. Patient reports people are coming by my door talking loud, yelling and screaming and coming in my room with keys. Patient reports having multiple medical Afibs and blood clots due to this. Patient reports people are doing things in the dining room. When asked if she called EMS, patient stated I called EMS, I stripped searched over the phone and called the authorities. Patient is admitted to [facility] unit with Q15 min safety monitoring. Multidisciplinary team approach is offered. Medication management; group/milieu therapy is offered.   [name] : [phone]- called on 07/28/24  to update the med changes but went to generic voicemail Subjective:  Chart reviewed, case discussed in multidisciplinary meeting, patient seen during rounds.  Patient remains disorganized and tangential at times.  She is redirectable.  She screams with no triggers intermittently and she calls out for this provider multiple times throughout the day.  After getting reassurance from the provider she goes back to her room.  She remains delusional about having has been waiting for her outside.  She request the provider that her family should not be determining her discharge.  She was educated about alternate placement as her previous group home is not accepting new patients and.  Patient denies SI/HI/plan. Past Psychiatric History: see h&P Family History:  Family History  Problem Relation Age of Onset   Colon cancer Neg Hx    Celiac disease Neg Hx    Inflammatory bowel disease Neg Hx    Social History:  Social History   Substance and Sexual Activity  Alcohol Use  Never     Social History   Substance and Sexual Activity  Drug Use Never    Social History   Socioeconomic History   Marital status: Single    Spouse name: Not on file   Number of children: Not on file   Years of education: Not on file   Highest education level: Not on file  Occupational History   Not on file  Tobacco Use   Smoking status: Never   Smokeless tobacco: Never  Substance and Sexual Activity   Alcohol use: Never   Drug use: Never   Sexual activity: Not Currently    Birth control/protection: Pill  Other Topics Concern   Not on file  Social History Narrative   Not on file   Social Drivers of Health   Tobacco Use: Low Risk (06/19/2024)   Patient History    Smoking Tobacco Use: Never    Smokeless Tobacco Use: Never    Passive Exposure: Not on file  Financial Resource Strain: Not on file  Food Insecurity: Unknown (06/19/2024)   Epic    Worried About Programme Researcher, Broadcasting/film/video in the Last Year: Never true    [employer] of Food in the Last Year: Patient declined  Transportation Needs: No Transportation Needs (06/19/2024)   Epic    Lack of Transportation (Medical): No    Lack of Transportation (Non-Medical): No  Physical Activity: Not on file  Stress: Not on file  Social Connections: Not on file  Depression (PHQ2-9): Low Risk (12/21/2022)   Depression (PHQ2-9)    PHQ-2 Score: 0  Alcohol Screen: Low Risk (06/19/2024)  Alcohol Screen    Last Alcohol Screening Score (AUDIT): 0  Housing: Low Risk (06/19/2024)   Epic    Unable to Pay for Housing in the Last Year: No    Number of Times Moved in the Last Year: 0    Homeless in the Last Year: No  Utilities: Not At Risk (06/19/2024)   Epic    Threatened with loss of utilities: No  Health Literacy: Not on file   Past Medical History:  Past Medical History:  Diagnosis Date   Chronic lower extremity pain (2ry area of Pain) (Right) 12/21/2022   Chronic pain syndrome 12/20/2022   DDD (degenerative disc disease), lumbosacral  12/21/2022   HTN (hypertension)    Hypercholesteremia    Prediabetes    Schizophrenia (HCC)     Past Surgical History:  Procedure Laterality Date   COLONOSCOPY  2014   Dr. Tobie: Pancolonic diverticulosis, tortuous colon, next colonoscopy 2024   TONSILLECTOMY      Current Medications: Current Facility-Administered Medications  Medication Dose Route Frequency Provider Last Rate Last Admin   acetaminophen  (TYLENOL ) tablet 650 mg  650 mg Oral Q6H PRN Onuoha, Chinwendu V, NP   650 mg at 07/11/24 1835   ARIPiprazole  (ABILIFY ) tablet 30 mg  30 mg Oral Daily Lillyanne Bradburn, MD   30 mg at 08/02/24 9040   aspirin  EC tablet 81 mg  81 mg Oral Daily Bobbitt, Shalon E, NP   81 mg at 08/03/24 0947   cholecalciferol  (VITAMIN D3) 25 MCG (1000 UNIT) tablet 1,000 Units  1,000 Units Oral Weekly Nichelle Renwick, MD   1,000 Units at 08/01/24 1129   cloZAPine  (CLOZARIL ) tablet 150 mg  150 mg Oral BID Michelena Culmer, MD   150 mg at 08/03/24 9050   Or   OLANZapine  (ZYPREXA ) injection 10 mg  10 mg Intramuscular BID Elvenia Godden, MD       dapagliflozin  propanediol (FARXIGA ) tablet 10 mg  10 mg Oral Daily Josette Ade, MD   10 mg at 08/03/24 9050   diclofenac  Sodium (VOLTAREN ) 1 % topical gel 4 g  4 g Topical QID PRN Madaram, Kondal R, MD       famotidine  (PEPCID ) tablet 40 mg  40 mg Oral Daily Shrivastava, Aryendra, MD   40 mg at 08/03/24 0947   fluticasone  (FLONASE ) 50 MCG/ACT nasal spray 1 spray  1 spray Each Nare Daily PRN Madaram, Kondal R, MD       furosemide  (LASIX ) tablet 40 mg  40 mg Oral 2 times per day Josette Ade, MD   40 mg at 08/03/24 9052   gabapentin  (NEURONTIN ) capsule 300 mg  300 mg Oral TID Bobbitt, Shalon E, NP   300 mg at 08/03/24 0947   hydrOXYzine  (ATARAX ) tablet 25 mg  25 mg Oral TID PRN Bobbitt, Shalon E, NP   25 mg at 08/02/24 0155   magnesium  hydroxide (MILK OF MAGNESIA) suspension 30 mL  30 mL Oral Daily PRN Bobbitt, Shalon E, NP       metoprolol  succinate (TOPROL -XL) 24  hr tablet 25 mg  25 mg Oral BID Josette Ade, MD   25 mg at 08/03/24 0948   multivitamin with minerals tablet 1 tablet  1 tablet Oral Daily Bobbitt, Shalon E, NP   1 tablet at 08/03/24 0947   naproxen  (NAPROSYN ) tablet 375 mg  375 mg Oral TID WC Corita Allinson, MD   375 mg at 08/03/24 0948   OLANZapine  (ZYPREXA ) injection 5 mg  5 mg Intramuscular TID PRN  Bobbitt, Shalon E, NP   5 mg at 06/22/24 2259   OLANZapine  zydis (ZYPREXA ) disintegrating tablet 5 mg  5 mg Oral TID PRN Bobbitt, Shalon E, NP   5 mg at 08/02/24 0155   ondansetron  (ZOFRAN ) tablet 4 mg  4 mg Oral Q8H PRN Bobbitt, Shalon E, NP   4 mg at 07/11/24 1835   Oxcarbazepine  (TRILEPTAL ) tablet 300 mg  300 mg Oral BID Alim Cattell, MD   300 mg at 08/03/24 0948   potassium chloride  SA (KLOR-CON  M) CR tablet 20 mEq  20 mEq Oral BID Bobbitt, Shalon E, NP   20 mEq at 08/03/24 0947   rosuvastatin  (CRESTOR ) tablet 5 mg  5 mg Oral Daily Bobbitt, Shalon E, NP   5 mg at 08/03/24 9051   traZODone  (DESYREL ) tablet 50 mg  50 mg Oral QHS PRN Bobbitt, Shalon E, NP   50 mg at 08/02/24 2108   zolpidem  (AMBIEN ) tablet 10 mg  10 mg Oral QHS Hazley Dezeeuw, MD   10 mg at 08/02/24 2108    Lab Results:  Results for orders placed or performed during the hospital encounter of 06/19/24 (from the past 48 hours)  Glucose, capillary     Status: Abnormal   Collection Time: 08/02/24  7:46 AM  Result Value Ref Range   Glucose-Capillary 145 (H) 70 - 99 mg/dL    Comment: Glucose reference range applies only to samples taken after fasting for at least 8 hours.  CBC with Differential/Platelet     Status: None   Collection Time: 08/03/24  7:34 AM  Result Value Ref Range   WBC 7.3 4.0 - 10.5 K/uL   RBC 4.47 3.87 - 5.11 MIL/uL   Hemoglobin 12.0 12.0 - 15.0 g/dL   HCT 61.8 63.9 - 53.9 %   MCV 85.2 80.0 - 100.0 fL   MCH 26.8 26.0 - 34.0 pg   MCHC 31.5 30.0 - 36.0 g/dL   RDW 86.0 88.4 - 84.4 %   Platelets 355 150 - 400 K/uL   nRBC 0.0 0.0 - 0.2 %    Neutrophils Relative % 40 %   Neutro Abs 2.9 1.7 - 7.7 K/uL   Lymphocytes Relative 42 %   Lymphs Abs 3.1 0.7 - 4.0 K/uL   Monocytes Relative 10 %   Monocytes Absolute 0.8 0.1 - 1.0 K/uL   Eosinophils Relative 6 %   Eosinophils Absolute 0.4 0.0 - 0.5 K/uL   Basophils Relative 1 %   Basophils Absolute 0.1 0.0 - 0.1 K/uL   Immature Granulocytes 1 %   Abs Immature Granulocytes 0.04 0.00 - 0.07 K/uL    Comment: Performed at [facility], [address] [place], [place] [address]  Basic metabolic panel with GFR     Status: Abnormal   Collection Time: 08/03/24  7:34 AM  Result Value Ref Range   Sodium 134 (L) 135 - 145 mmol/L   Potassium 4.1 3.5 - 5.1 mmol/L   Chloride 97 (L) 98 - 111 mmol/L   CO2 28 22 - 32 mmol/L   Glucose, Bld 125 (H) 70 - 99 mg/dL    Comment: Glucose reference range applies only to samples taken after fasting for at least 8 hours.   BUN 12 8 - 23 mg/dL   Creatinine, Ser 9.40 0.44 - 1.00 mg/dL   Calcium  9.5 8.9 - 10.3 mg/dL   GFR, Estimated >39 >39 mL/min    Comment: (NOTE) Calculated using the CKD-EPI Creatinine Equation (2021)    Anion  gap 10 5 - 15    Comment: Performed at [facility], [address] [place], [place] [address]  Glucose, capillary     Status: Abnormal   Collection Time: 08/03/24  7:34 AM  Result Value Ref Range   Glucose-Capillary 119 (H) 70 - 99 mg/dL    Comment: Glucose reference range applies only to samples taken after fasting for at least 8 hours.        Blood Alcohol level:  Lab Results  Component Value Date   [facility] <15 06/18/2024    Metabolic Disorder Labs: Lab Results  Component Value Date   HGBA1C 5.9 (H) 06/28/2024   MPG 122.63 06/28/2024   No results found for: PROLACTIN Lab Results  Component Value Date   CHOL 156 06/28/2024   TRIG 89 06/28/2024   HDL 70 06/28/2024   CHOLHDL 2.2 06/28/2024   VLDL 18 06/28/2024   LDLCALC 69 06/28/2024    Physical Findings: AIMS:  , ,  ,  ,    CIWA:     COWS:      Psychiatric Specialty Exam:  Presentation  General Appearance:  Casual  Eye Contact: Fleeting  Speech: Normal Rate  Speech Volume: Increased    Mood and Affect  Mood: Euphoric  Affect: stable  Thought Process  Thought Processes: Improving illogical  Orientation:Full (Time, Place and Person)  Thought Content:Illogical; Delusions; Tangential  Hallucinations: Denies  Ideas of Reference:Delusions  Suicidal Thoughts: Denies  Homicidal Thoughts: Denies   Sensorium  Memory: Immediate Fair; Remote Fair  Judgment: Impaired  Insight: Shallow   Executive Functions  Concentration: Poor  Attention Span: Poor  Recall: [employer] of Knowledge: Fair  Language: Fair   Psychomotor Activity  Psychomotor Activity: No data recorded  Musculoskeletal: Strength & Muscle Tone: within normal limits Gait & Station: normal Assets  Assets: Manufacturing Systems Engineer; Desire for Improvement    Physical Exam: Physical Exam Vitals and nursing note reviewed.    ROS Blood pressure (!) 123/90, pulse (!) 112, temperature (!) 97.2 F (36.2 C), resp. rate 18, height 5' 5 (1.651 m), weight 87.3 kg, SpO2 100%. Body mass index is 32.03 kg/m.  Diagnosis: Principal Problem:   Bilateral lower extremity edema Active Problems:   Hypercholesteremia   Schizoaffective disorder, bipolar type (HCC)   Chest pain   Sinus tachycardia   Hypotension   Controlled type 2 diabetes mellitus without complication, without long-term current use of insulin (HCC)   Hyponatremia  Schizoaffective disorder bipolar type  Clinical Decision Making: Patient is currently admitted for worsening psychosis, manic symptoms, tangential grandiosity.  She needs inpatient hospitalization for medication management and close monitoring.  Patient is unable to make decisions on her own as she is unable to appreciate and understand her mental health history, the current need for  treatment.  [facility] has screened her case and for further evaluation for legal guardianship. [facility] screened in her case. Sister and mom are involved in her care.  Treatment Plan Summary:  Patient is not responding at all to Clozaril  in spite of increased dose and also poor response to Geodon  which was discontinued and started on Abilify  which was titrated to 20 mg today.  If patient remains manic/psychotic we will discuss a plan to switch Clozaril  to loxapine as patient has documented allergy to Risperdal/paliperidone. Safety and Monitoring:             -- inoluntary admission to inpatient psychiatric unit for safety, stabilization and treatment             --  Daily contact with patient to assess and evaluate symptoms and progress in treatment             -- Patient's case to be discussed in multi-disciplinary team meeting             -- Observation Level: q15 minute checks             -- Vital signs:  q12 hours             -- Precautions: suicide, elopement, and assault   2. Psychiatric Diagnoses and Treatment:  Discontinue Depakote  as patient is noncompliant Discontinue Geodon  20 mg nightly as poor response Increased Clozaril  150mg  twice daily enforced with zyprexa  10mg  BID- Cymbalta  40 mg daily-discontinue to minimize manic Trilepta 300 mg twice daily Abilify  increased to 30 mg for optimization and transition to LAI Abilify  Maintena -- The risks/benefits/side-effects/alternatives to this medication were discussed in detail with the patient and time was given for questions. The patient consents to medication trial.                -- Metabolic profile and EKG monitoring obtained while on an atypical antipsychotic (BMI: Lipid Panel: HbgA1c: QTc:)              -- Encouraged patient to participate in unit milieu and in scheduled group therapies                            3. Medical Issues Being Addressed:   Chest pain Sinus tachycardia HTN (hypertension) Bilateral lower extremity  edema Hypercholesteremia  12/30:  Troponin negative x 2. Echocardiogram done with pending results.  Small improvement in lower extremity edema so continuing p.o. Lasix .  Will need BMP every other day.  4. Discharge Planning:   -- Social work and case management to assist with discharge planning and identification of hospital follow-up needs prior to discharge  -- Estimated LOS: 3-4 days  Allyn Foil, MD 08/03/2024, 12:25 PM

## 2024-08-04 DIAGNOSIS — F250 Schizoaffective disorder, bipolar type: Secondary | ICD-10-CM | POA: Diagnosis not present

## 2024-08-04 LAB — GLUCOSE, CAPILLARY: Glucose-Capillary: 116 mg/dL — ABNORMAL HIGH (ref 70–99)

## 2024-08-04 MED ORDER — ARIPIPRAZOLE ER 400 MG IM SRER
400.0000 mg | Freq: Once | INTRAMUSCULAR | Status: AC
  Administered 2024-08-04: 400 mg via INTRAMUSCULAR
  Filled 2024-08-04: qty 2

## 2024-08-04 NOTE — Group Note (Signed)
 Date:  08/04/2024 Time:  11:06 AM  Group Topic/Focus:  Coping With Mental Health Crisis:   The purpose of this group is to help patients identify strategies for coping with mental health crisis.  Group discusses possible causes of crisis and ways to manage them effectively.    Participation Level:  Did Not Attend   Arland Nutting 08/04/2024, 11:06 AM

## 2024-08-04 NOTE — Progress Notes (Signed)
 [facility] MD Progress Note   9:26 PM Valerie [name]  MRN:  [id number]   Valerie [name] is a 64 year old female presenting voluntary to [facility] requesting a psychiatric evaluation for worsening psychosis. Patient has history of schizophrenia. Patient denied SI, HI, psychosis and alcohol/drug usage. Patient resides at [facility]. Patient reports people are coming by my door talking loud, yelling and screaming and coming in my room with keys. Patient reports having multiple medical Afibs and blood clots due to this. Patient reports people are doing things in the dining room. When asked if she called EMS, patient stated I called EMS, I stripped searched over the phone and called the authorities. Patient is admitted to [facility] unit with Q15 min safety monitoring. Multidisciplinary team approach is offered. Medication management; group/milieu therapy is offered.   Sister Nahia Nissan : [phone]- called on 07/28/24  to update the med changes but went to generic voicemail  Subjective:  Chart reviewed, case discussed in multidisciplinary meeting, patient seen during rounds.   Patient came out of her room running for the provider.  She continues to repeat herself that she needs to go home and she has somebody waiting for her outside.  Patient reassured her about discharge planning and the team working with her sister and mother.  She continues to express being anxious and feeling unsafe.  She is unable to give more details.  She remains tangential and intrusive at times.  Today she refused Abilify  morning medication.  With a lot of prompting she eventually received Abilify  maintainer LAI.  Patient tolerated the LAI with no problems.  She reports fair appetite and sleep and denies SI/HI/plan   Past Psychiatric History: see h&P Family History:  Family History  Problem Relation Age of Onset   Colon cancer Neg Hx    Celiac disease Neg Hx    Inflammatory bowel disease Neg Hx     Social History:  Social History   Substance and Sexual Activity  Alcohol Use Never     Social History   Substance and Sexual Activity  Drug Use Never    Social History   Socioeconomic History   Marital status: Single    Spouse name: Not on file   Number of children: Not on file   Years of education: Not on file   Highest education level: Not on file  Occupational History   Not on file  Tobacco Use   Smoking status: Never   Smokeless tobacco: Never  Substance and Sexual Activity   Alcohol use: Never   Drug use: Never   Sexual activity: Not Currently    Birth control/protection: Pill  Other Topics Concern   Not on file  Social History Narrative   Not on file   Social Drivers of Health   Tobacco Use: Low Risk (06/19/2024)   Patient History    Smoking Tobacco Use: Never    Smokeless Tobacco Use: Never    Passive Exposure: Not on file  Financial Resource Strain: Not on file  Food Insecurity: Unknown (06/19/2024)   Epic    Worried About Programme Researcher, Broadcasting/film/video in the Last Year: Never true    [employer] of Food in the Last Year: Patient declined  Transportation Needs: No Transportation Needs (06/19/2024)   Epic    Lack of Transportation (Medical): No    Lack of Transportation (Non-Medical): No  Physical Activity: Not on file  Stress: Not on file  Social Connections: Not on file  Depression (PHQ2-9): Low Risk (12/21/2022)   Depression (PHQ2-9)    PHQ-2 Score: 0  Alcohol Screen: Low Risk (06/19/2024)   Alcohol Screen    Last Alcohol Screening Score (AUDIT): 0  Housing: Low Risk (06/19/2024)   Epic    Unable to Pay for Housing in the Last Year: No    Number of Times Moved in the Last Year: 0    Homeless in the Last Year: No  Utilities: Not At Risk (06/19/2024)   Epic    Threatened with loss of utilities: No  Health Literacy: Not on file   Past Medical History:  Past Medical History:  Diagnosis Date   Chronic lower extremity pain (2ry area of Pain) (Right)  12/21/2022   Chronic pain syndrome 12/20/2022   DDD (degenerative disc disease), 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 Tobie: Pancolonic diverticulosis, tortuous colon, next colonoscopy 2024   TONSILLECTOMY      Current Medications: Current Facility-Administered Medications  Medication Dose Route Frequency Provider Last Rate Last Admin   acetaminophen  (TYLENOL ) tablet 650 mg  650 mg Oral Q6H PRN Onuoha, Chinwendu V, NP   650 mg at 07/11/24 1835   ARIPiprazole  (ABILIFY ) tablet 30 mg  30 mg Oral Daily Hilari Wethington, MD   30 mg at 08/02/24 0959   aspirin  EC tablet 81 mg  81 mg Oral Daily Bobbitt, Shalon E, NP   81 mg at 08/04/24 9075   cholecalciferol  (VITAMIN D3) 25 MCG (1000 UNIT) tablet 1,000 Units  1,000 Units Oral Weekly Nakeda Lebron, MD   1,000 Units at 08/01/24 1129   cloZAPine  (CLOZARIL ) tablet 150 mg  150 mg Oral BID Ronda Kazmi, MD   150 mg at 08/04/24 9075   Or   OLANZapine  (ZYPREXA ) injection 10 mg  10 mg Intramuscular BID Nikala Walsworth, MD       dapagliflozin  propanediol (FARXIGA ) tablet 10 mg  10 mg Oral Daily Josette Ade, MD   10 mg at 08/04/24 9076   diclofenac  Sodium (VOLTAREN ) 1 % topical gel 4 g  4 g Topical QID PRN Madaram, Kondal R, MD       famotidine  (PEPCID ) tablet 40 mg  40 mg Oral Daily Shrivastava, Aryendra, MD   40 mg at 08/04/24 9075   fluticasone  (FLONASE ) 50 MCG/ACT nasal spray 1 spray  1 spray Each Nare Daily PRN Madaram, Kondal R, MD       furosemide  (LASIX ) tablet 40 mg  40 mg Oral 2 times per day Josette Ade, MD   40 mg at 08/04/24 1708   gabapentin  (NEURONTIN ) capsule 300 mg  300 mg Oral TID Bobbitt, Shalon E, NP   300 mg at 08/04/24 1708   hydrOXYzine  (ATARAX ) tablet 25 mg  25 mg Oral TID PRN Bobbitt, Shalon E, NP   25 mg at 08/02/24 0155   magnesium  hydroxide (MILK OF MAGNESIA)  suspension 30 mL  30 mL Oral Daily PRN Bobbitt, Shalon E, NP       metoprolol  succinate (TOPROL -XL) 24 hr tablet 25 mg  25 mg Oral BID Josette Ade, MD   25 mg at 08/03/24 2123   multivitamin with minerals tablet 1 tablet  1 tablet Oral Daily Bobbitt, Shalon E, NP   1 tablet at 08/04/24 0924   naproxen  (NAPROSYN ) tablet 375 mg  375 mg Oral TID WC  Donnelly Mellow, MD   375 mg at 08/04/24 1708   OLANZapine  (ZYPREXA ) injection 5 mg  5 mg Intramuscular TID PRN Bobbitt, Shalon E, NP   5 mg at 06/22/24 2259   OLANZapine  zydis (ZYPREXA ) disintegrating tablet 5 mg  5 mg Oral TID PRN Bobbitt, Shalon E, NP   5 mg at 08/02/24 0155   ondansetron  (ZOFRAN ) tablet 4 mg  4 mg Oral Q8H PRN Bobbitt, Shalon E, NP   4 mg at 07/11/24 1835   Oxcarbazepine  (TRILEPTAL ) tablet 300 mg  300 mg Oral BID Cambrey Lupi, MD   300 mg at 08/04/24 9075   potassium chloride  SA (KLOR-CON  M) CR tablet 20 mEq  20 mEq Oral BID Bobbitt, Shalon E, NP   20 mEq at 08/04/24 0925   rosuvastatin  (CRESTOR ) tablet 5 mg  5 mg Oral Daily Bobbitt, Shalon E, NP   5 mg at 08/04/24 9076   traZODone  (DESYREL ) tablet 50 mg  50 mg Oral QHS PRN Bobbitt, Shalon E, NP   50 mg at 08/02/24 2108   zolpidem  (AMBIEN ) tablet 10 mg  10 mg Oral QHS Donnelly Mellow, MD   10 mg at 08/03/24 2123    Lab Results:  Results for orders placed or performed during the hospital encounter of 06/19/24 (from the past 48 hours)  CBC with Differential/Platelet     Status: None   Collection Time: 08/03/24  7:34 AM  Result Value Ref Range   WBC 7.3 4.0 - 10.5 K/uL   RBC 4.47 3.87 - 5.11 MIL/uL   Hemoglobin 12.0 12.0 - 15.0 g/dL   HCT 61.8 63.9 - 53.9 %   MCV 85.2 80.0 - 100.0 fL   MCH 26.8 26.0 - 34.0 pg   MCHC 31.5 30.0 - 36.0 g/dL   RDW 86.0 88.4 - 84.4 %   Platelets 355 150 - 400 K/uL   nRBC 0.0 0.0 - 0.2 %   Neutrophils Relative % 40 %   Neutro Abs 2.9 1.7 - 7.7 K/uL   Lymphocytes Relative 42 %   Lymphs Abs 3.1 0.7 - 4.0 K/uL   Monocytes Relative 10 %    Monocytes Absolute 0.8 0.1 - 1.0 K/uL   Eosinophils Relative 6 %   Eosinophils Absolute 0.4 0.0 - 0.5 K/uL   Basophils Relative 1 %   Basophils Absolute 0.1 0.0 - 0.1 K/uL   Immature Granulocytes 1 %   Abs Immature Granulocytes 0.04 0.00 - 0.07 K/uL    Comment: Performed at [facility], [address] [place], [place] [address]  Basic metabolic panel with GFR     Status: Abnormal   Collection Time: 08/03/24  7:34 AM  Result Value Ref Range   Sodium 134 (L) 135 - 145 mmol/L   Potassium 4.1 3.5 - 5.1 mmol/L   Chloride 97 (L) 98 - 111 mmol/L   CO2 28 22 - 32 mmol/L   Glucose, Bld 125 (H) 70 - 99 mg/dL    Comment: Glucose reference range applies only to samples taken after fasting for at least 8 hours.   BUN 12 8 - 23 mg/dL   Creatinine, Ser 9.40 0.44 - 1.00 mg/dL   Calcium  9.5 8.9 - 10.3 mg/dL   GFR, Estimated >39 >39 mL/min    Comment: (NOTE) Calculated using the CKD-EPI Creatinine Equation (2021)    Anion gap 10 5 - 15    Comment: Performed at [facility], [address], [place], [place] [address]  Glucose, capillary  Status: Abnormal   Collection Time: 08/03/24  7:34 AM  Result Value Ref Range   Glucose-Capillary 119 (H) 70 - 99 mg/dL    Comment: Glucose reference range applies only to samples taken after fasting for at least 8 hours.  Glucose, capillary     Status: Abnormal   Collection Time: 08/04/24  7:19 AM  Result Value Ref Range   Glucose-Capillary 116 (H) 70 - 99 mg/dL    Comment: Glucose reference range applies only to samples taken after fasting for at least 8 hours.        Blood Alcohol level:  Lab Results  Component Value Date   [facility] <15 06/18/2024    Metabolic Disorder Labs: Lab Results  Component Value Date   HGBA1C 5.9 (H) 06/28/2024   MPG 122.63 06/28/2024   No results found for: PROLACTIN Lab Results  Component Value Date   CHOL 156 06/28/2024   TRIG 89 06/28/2024   HDL 70 06/28/2024   CHOLHDL 2.2 06/28/2024    VLDL 18 06/28/2024   LDLCALC 69 06/28/2024    Physical Findings: AIMS:  , ,  ,  ,    CIWA:    COWS:      Psychiatric Specialty Exam:  Presentation  General Appearance:  Casual  Eye Contact: Fleeting  Speech: Normal Rate  Speech Volume: Increased    Mood and Affect  Mood: Euphoric  Affect: stable  Thought Process  Thought Processes: Improving illogical  Orientation:Full (Time, Place and Person)  Thought Content:Illogical; Delusions; Tangential  Hallucinations: Denies  Ideas of Reference:Delusions  Suicidal Thoughts: Denies  Homicidal Thoughts: Denies   Sensorium  Memory: Immediate Fair; Remote Fair  Judgment: Impaired  Insight: Shallow   Executive Functions  Concentration: Poor  Attention Span: Poor  Recall: [employer] of Knowledge: Fair  Language: Fair   Psychomotor Activity  Psychomotor Activity: No data recorded  Musculoskeletal: Strength & Muscle Tone: within normal limits Gait & Station: normal Assets  Assets: Manufacturing Systems Engineer; Desire for Improvement    Physical Exam: Physical Exam Vitals and nursing note reviewed.    ROS Blood pressure 122/84, pulse 74, temperature (!) 96.3 F (35.7 C), resp. rate 18, height 5' 5 (1.651 m), weight 87.3 kg, SpO2 100%. Body mass index is 32.03 kg/m.  Diagnosis: Principal Problem:   Bilateral lower extremity edema Active Problems:   Hypercholesteremia   Schizoaffective disorder, bipolar type (HCC)   Chest pain   Sinus tachycardia   Hypotension   Controlled type 2 diabetes mellitus without complication, without long-term current use of insulin (HCC)   Hyponatremia  Schizoaffective disorder bipolar type  Clinical Decision Making: Patient is currently admitted for worsening psychosis, manic symptoms, tangential grandiosity.  She needs inpatient hospitalization for medication management and close monitoring.  Patient is unable to make decisions on her own as she is  unable to appreciate and understand her mental health history, the current need for treatment.  [facility] has screened her case and for further evaluation for legal guardianship. [facility] screened in her case. Sister and mom are involved in her care.  Treatment Plan Summary:  Patient is not responding at all to Clozaril  in spite of increased dose and also poor response to Geodon  which was discontinued and started on Abilify  which was titrated to 20 mg today.  If patient remains manic/psychotic we will discuss a plan to switch Clozaril  to loxapine as patient has documented allergy to Risperdal/paliperidone. Safety and Monitoring:             --  inoluntary admission to inpatient psychiatric unit for safety, stabilization and treatment             -- Daily contact with patient to assess and evaluate symptoms and progress in treatment             -- Patient's case to be discussed in multi-disciplinary team meeting             -- Observation Level: q15 minute checks             -- Vital signs:  q12 hours             -- Precautions: suicide, elopement, and assault   2. Psychiatric Diagnoses and Treatment:  Discontinue Depakote  as patient is noncompliant Discontinue Geodon  20 mg nightly as poor response Increased Clozaril  150mg  twice daily enforced with zyprexa  10mg  BID- Cymbalta  40 mg daily-discontinue to minimize manic Trilepta 300 mg twice daily Abilify  increased to 30 mg for optimization and transition to LAI Abilify  Maintena -- The risks/benefits/side-effects/alternatives to this medication were discussed in detail with the patient and time was given for questions. The patient consents to medication trial.                -- Metabolic profile and EKG monitoring obtained while on an atypical antipsychotic (BMI: Lipid Panel: HbgA1c: QTc:)              -- Encouraged patient to participate in unit milieu and in scheduled group therapies                            3. Medical Issues Being Addressed:   Chest  pain Sinus tachycardia HTN (hypertension) Bilateral lower extremity edema Hypercholesteremia  12/30:  Troponin negative x 2. Echocardiogram done with pending results.  Small improvement in lower extremity edema so continuing p.o. Lasix .  Will need BMP every other day.  4. Discharge Planning:   -- Social work and case management to assist with discharge planning and identification of hospital follow-up needs prior to discharge  -- Estimated LOS: 3-4 days  Allyn Foil, MD 08/04/2024, 9:26 PM

## 2024-08-04 NOTE — Group Note (Signed)
 Recreation Therapy Group Note   Group Topic:Healthy Support Systems  Group Date: 08/04/2024 Start Time: 1400 End Time: 1445 Facilitators: Celestia Jeoffrey BRAVO, LRT, CTRS Location: Dayroom  Group Description: Straw Bridge. In groups or individually, patients were given 10 plastic drinking straws and an equal length of masking tape. Using the materials provided, patients were instructed to build a free-standing bridge-like structure to suspend an everyday item (ex: deck of cards) off the floor or table surface. All materials were required to be used in secondary school teacher. LRT facilitated post-activity discussion reviewing the importance of having strong and healthy support systems in our lives. LRT discussed how the people in our lives serve as the tape and the deck of cards we placed on top of our straw structure are the stressors we face in daily life. LRT and pts discussed what happens in our life when things get too heavy for us , and we don't have strong supports outside of the hospital. Pt shared 2 of their healthy supports in their life aloud in the group.   Goal Area(s) Addressed:  Patient will identify 2 healthy supports in their life. Patient will identify skills to successfully complete activity. Patient will identify correlation of this activity to life post-discharge.  Patient will build on frustration tolerance skills. Patient will increase team building and communication skills.    Affect/Mood: Appropriate   Participation Level: Moderate   Participation Quality: Minimal Cues   Behavior: Cooperative   Speech/Thought Process: Flight of ideas   Insight: Limited   Judgement: Limited   Modes of Intervention: STEM Activity   Patient Response to Interventions:  Receptive   Education Outcome:  In group clarification offered    Clinical Observations/Individualized Feedback: Haelee was mostly active in their participation of session activities and group discussion. Pt identified  family and treatment as healthy supports.    Plan: Continue to engage patient in RT group sessions 2-3x/week.   Jeoffrey BRAVO Celestia, LRT, CTRS 08/04/2024 4:45 PM

## 2024-08-04 NOTE — Progress Notes (Signed)
 SPIRITUAL CARE AND COUNSELING CONSULT NOTE   VISIT SUMMARY Chaplain provided spiritual/emotional support to [place] while rounding on unit. Chaplain consulted with nurse team.   SPIRITUAL ENCOUNTER                                                                                                                                                                      Type of Visit: Follow up Care provided to:: Patient Conversation partners present during encounter: Nurse Referral source: Patient request Reason for visit: Routine spiritual support OnCall Visit: No   SPIRITUAL FRAMEWORK  Presenting Themes: Community and relationships, Impactful experiences and emotions, Goals in life/care, Caregiving needs Community/Connection: Family Patient Stress Factors: Family relationships, Exhausted, Other (Comment) (grooming) Family Stress Factors: None identified   GOALS   Self/Personal Goals: healing, family visitation Clinical Care Goals: healing, family visitation   INTERVENTIONS   Spiritual Care Interventions Made: Normalization of emotions, Established relationship of care and support, Explored ethical dilemma, Reflective listening    INTERVENTION OUTCOMES   Outcomes: Autonomy/agency, Awareness of support, Reduced anxiety  Chaplain provided compassionate presence, reflective listening, and advocacy to elicit [place] feelings about health status, treatment plan, family support, and desired outcomes.   SPIRITUAL CARE PLAN   Spiritual Care Issues Still Outstanding: Chaplain will continue to follow    If immediate needs arise, please contact [facility] 24 hour on call [phone]   Barabara Chess, Chaplain  08/04/2024 2:19 PM

## 2024-08-04 NOTE — Progress Notes (Signed)
" °   08/04/24 1300  Psych Admission Type (Psych Patients Only)  Admission Status Involuntary  Psychosocial Assessment  Patient Complaints Anxiety  Eye Contact Fair  Facial Expression Animated  Affect Labile  Speech Tangential  Interaction Childlike;Intrusive  Motor Activity Slow  Appearance/Hygiene In scrubs  Behavior Characteristics Irritable  Mood Labile  Thought Process  Coherency Disorganized  Content Preoccupation  Delusions Paranoid;Grandeur  Perception Hallucinations  Hallucination Auditory  Judgment Impaired  Confusion Mild  Danger to Self  Current suicidal ideation? Denies  Danger to Others  Danger to Others None reported or observed    "

## 2024-08-04 NOTE — Progress Notes (Signed)
(  Sleep Hours) - 6.00 (Any PRNs that were needed, meds refused, or side effects to meds)- None needed or requested (Any disturbances and when (visitation, over night)-None reported or observed (Concerns raised by the patient)- None reported or observed (SI/HI/AVH)- Denies

## 2024-08-04 NOTE — Plan of Care (Signed)
   Problem: Activity: Goal: Interest or engagement in activities will improve Outcome: Progressing Goal: Sleeping patterns will improve Outcome: Progressing

## 2024-08-04 NOTE — Plan of Care (Signed)
" °  Problem: Health Behavior/Discharge Planning: Goal: Identification of resources available to assist in meeting health care needs will improve Outcome: Progressing Goal: Compliance with treatment plan for underlying cause of condition will improve Outcome: Progressing   Problem: Safety: Goal: Periods of time without injury will increase Outcome: Progressing   Problem: Activity: Goal: Will verbalize the importance of balancing activity with adequate rest periods Outcome: Progressing   "

## 2024-08-04 NOTE — BH IP Treatment Plan (Signed)
 Interdisciplinary Treatment and Diagnostic Plan Update  08/04/2024 Time of Session: 2:45 PM  Valerie [name] MRN: [id number]  Principal Diagnosis: Bilateral lower extremity edema  Secondary Diagnoses: Principal Problem:   Bilateral lower extremity edema Active Problems:   Hypercholesteremia   Schizoaffective disorder, bipolar type (HCC)   Chest pain   Sinus tachycardia   Hypotension   Controlled type 2 diabetes mellitus without complication, without long-term current use of insulin (HCC)   Hyponatremia   Current Medications:  Current Facility-Administered Medications  Medication Dose Route Frequency Provider Last Rate Last Admin   acetaminophen  (TYLENOL ) tablet 650 mg  650 mg Oral Q6H PRN Onuoha, Chinwendu V, NP   650 mg at 07/11/24 1835   ARIPiprazole  (ABILIFY ) tablet 30 mg  30 mg Oral Daily Jadapalle, Sree, MD   30 mg at 08/02/24 0959   aspirin  EC tablet 81 mg  81 mg Oral Daily Bobbitt, Shalon E, NP   81 mg at 08/04/24 9075   cholecalciferol  (VITAMIN D3) 25 MCG (1000 UNIT) tablet 1,000 Units  1,000 Units Oral Weekly Jadapalle, Sree, MD   1,000 Units at 08/01/24 1129   cloZAPine  (CLOZARIL ) tablet 150 mg  150 mg Oral BID Jadapalle, Sree, MD   150 mg at 08/04/24 9075   Or   OLANZapine  (ZYPREXA ) injection 10 mg  10 mg Intramuscular BID Jadapalle, Sree, MD       dapagliflozin  propanediol (FARXIGA ) tablet 10 mg  10 mg Oral Daily Josette Ade, MD   10 mg at 08/04/24 9076   diclofenac  Sodium (VOLTAREN ) 1 % topical gel 4 g  4 g Topical QID PRN Madaram, Kondal R, MD       famotidine  (PEPCID ) tablet 40 mg  40 mg Oral Daily Shrivastava, Aryendra, MD   40 mg at 08/04/24 9075   fluticasone  (FLONASE ) 50 MCG/ACT nasal spray 1 spray  1 spray Each Nare Daily PRN Madaram, Kondal R, MD       furosemide  (LASIX ) tablet 40 mg  40 mg Oral 2 times per day Josette Ade, MD   40 mg at 08/04/24 9075   gabapentin  (NEURONTIN ) capsule 300 mg  300 mg Oral TID Bobbitt, Shalon E, NP   300 mg at 08/04/24  9074   hydrOXYzine  (ATARAX ) tablet 25 mg  25 mg Oral TID PRN Bobbitt, Shalon E, NP   25 mg at 08/02/24 0155   magnesium  hydroxide (MILK OF MAGNESIA) suspension 30 mL  30 mL Oral Daily PRN Bobbitt, Shalon E, NP       metoprolol  succinate (TOPROL -XL) 24 hr tablet 25 mg  25 mg Oral BID Josette Ade, MD   25 mg at 08/03/24 2123   multivitamin with minerals tablet 1 tablet  1 tablet Oral Daily Bobbitt, Shalon E, NP   1 tablet at 08/04/24 0924   naproxen  (NAPROSYN ) tablet 375 mg  375 mg Oral TID WC Jadapalle, Sree, MD   375 mg at 08/04/24 0925   OLANZapine  (ZYPREXA ) injection 5 mg  5 mg Intramuscular TID PRN Bobbitt, Shalon E, NP   5 mg at 06/22/24 2259   OLANZapine  zydis (ZYPREXA ) disintegrating tablet 5 mg  5 mg Oral TID PRN Bobbitt, Shalon E, NP   5 mg at 08/02/24 0155   ondansetron  (ZOFRAN ) tablet 4 mg  4 mg Oral Q8H PRN Bobbitt, Shalon E, NP   4 mg at 07/11/24 1835   Oxcarbazepine  (TRILEPTAL ) tablet 300 mg  300 mg Oral BID Jadapalle, Sree, MD   300 mg at 08/04/24 9075   potassium chloride   SA (KLOR-CON  M) CR tablet 20 mEq  20 mEq Oral BID Bobbitt, Shalon E, NP   20 mEq at 08/04/24 9074   rosuvastatin  (CRESTOR ) tablet 5 mg  5 mg Oral Daily Bobbitt, Shalon E, NP   5 mg at 08/04/24 9076   traZODone  (DESYREL ) tablet 50 mg  50 mg Oral QHS PRN Bobbitt, Shalon E, NP   50 mg at 08/02/24 2108   zolpidem  (AMBIEN ) tablet 10 mg  10 mg Oral QHS Jadapalle, Sree, MD   10 mg at 08/03/24 2123   PTA Medications: Medications Prior to Admission  Medication Sig Dispense Refill Last Dose/Taking   acetaminophen  (TYLENOL ) 650 MG CR tablet Take 650 mg by mouth every 8 (eight) hours as needed for pain.      benztropine (COGENTIN) 0.5 MG tablet Take by mouth.      bisacodyl  5 MG EC tablet As directed for procedure 8 tablet 0    Cholecalciferol  (VITAMIN D ) 125 MCG (5000 UT) CAPS Take 1,000 Units by mouth once a week.      cloZAPine  (CLOZARIL ) 100 MG tablet Take 200 mg by mouth 2 (two) times daily.      DULoxetine   (CYMBALTA ) 20 MG capsule Take 20 mg by mouth daily.      fluticasone  (FLONASE ) 50 MCG/ACT nasal spray Place 1 spray into both nostrils daily.      gabapentin  (NEURONTIN ) 300 MG capsule Take 300 mg by mouth 3 (three) times daily.      haloperidol  (HALDOL ) 0.5 MG tablet Take 0.5 mg by mouth 2 (two) times daily.      hydrochlorothiazide  (HYDRODIURIL ) 25 MG tablet Take 25 mg by mouth daily.      lisinopril  (ZESTRIL ) 30 MG tablet Take 30 mg by mouth daily.      loperamide (IMODIUM) 2 MG capsule Take 2 mg by mouth as needed for diarrhea or loose stools. Take 1 capsule by mouth as needed with each loose stool/diarrhea nte 8 doses in 24 hr      Multiple Vitamin (DAILY VITE PO) Take 1 tablet by mouth daily.      naproxen  (NAPROSYN ) 500 MG tablet Take 500 mg by mouth every 12 (twelve) hours as needed.      omeprazole (PRILOSEC) 20 MG capsule Take 20 mg by mouth daily.      ondansetron  (ZOFRAN ) 4 MG tablet Take 4 mg by mouth every 8 (eight) hours as needed for nausea or vomiting.      potassium chloride  SA (KLOR-CON  M) 20 MEQ tablet Take 20 mEq by mouth 2 (two) times daily.      propranolol  (INDERAL ) 40 MG tablet Take 40 mg by mouth 2 (two) times daily.      rosuvastatin  (CRESTOR ) 5 MG tablet Take 5 mg by mouth daily.      sodium phosphate  (FLEET) ENEM As directed for procedure 133 mL 0    triamcinolone ointment (KENALOG) 0.1 % Apply topically.       Patient Stressors: Medication change or noncompliance    Patient Strengths: Ability for insight   Treatment Modalities: Medication Management, Group therapy, Case management,  1 to 1 session with clinician, Psychoeducation, Recreational therapy.   Physician Treatment Plan for Primary Diagnosis: Bilateral lower extremity edema Long Term Goal(s): Improvement in symptoms so as ready for discharge   Short Term Goals: Ability to identify changes in lifestyle to reduce recurrence of condition will improve Ability to verbalize feelings will improve Ability  to disclose and discuss suicidal ideas Ability to demonstrate self-control  will improve Ability to identify and develop effective coping behaviors will improve  Medication Management: Evaluate patient's response, side effects, and tolerance of medication regimen.  Therapeutic Interventions: 1 to 1 sessions, Unit Group sessions and Medication administration.  Evaluation of Outcomes: Not Progressing  Physician Treatment Plan for Secondary Diagnosis: Principal Problem:   Bilateral lower extremity edema Active Problems:   Hypercholesteremia   Schizoaffective disorder, bipolar type (HCC)   Chest pain   Sinus tachycardia   Hypotension   Controlled type 2 diabetes mellitus without complication, without long-term current use of insulin (HCC)   Hyponatremia  Long Term Goal(s): Improvement in symptoms so as ready for discharge   Short Term Goals: Ability to identify changes in lifestyle to reduce recurrence of condition will improve Ability to verbalize feelings will improve Ability to disclose and discuss suicidal ideas Ability to demonstrate self-control will improve Ability to identify and develop effective coping behaviors will improve     Medication Management: Evaluate patient's response, side effects, and tolerance of medication regimen.  Therapeutic Interventions: 1 to 1 sessions, Unit Group sessions and Medication administration.  Evaluation of Outcomes: Not Progressing   RN Treatment Plan for Primary Diagnosis: Bilateral lower extremity edema Long Term Goal(s): Knowledge of disease and therapeutic regimen to maintain health will improve  Short Term Goals: Ability to remain free from injury will improve, Ability to verbalize frustration and anger appropriately will improve, Ability to demonstrate self-control, Ability to participate in decision making will improve, Ability to verbalize feelings will improve, Ability to disclose and discuss suicidal ideas, Ability to identify and  develop effective coping behaviors will improve, and Compliance with prescribed medications will improve  Medication Management: RN will administer medications as ordered by provider, will assess and evaluate patient's response and provide education to patient for prescribed medication. RN will report any adverse and/or side effects to prescribing provider.  Therapeutic Interventions: 1 on 1 counseling sessions, Psychoeducation, Medication administration, Evaluate responses to treatment, Monitor vital signs and CBGs as ordered, Perform/monitor CIWA, COWS, AIMS and Fall Risk screenings as ordered, Perform wound care treatments as ordered.  Evaluation of Outcomes: Not Progressing   LCSW Treatment Plan for Primary Diagnosis: Bilateral lower extremity edema Long Term Goal(s): Safe transition to appropriate next level of care at discharge, Engage patient in therapeutic group addressing interpersonal concerns.  Short Term Goals: Engage patient in aftercare planning with referrals and resources, Increase social support, Increase ability to appropriately verbalize feelings, Increase emotional regulation, Facilitate acceptance of mental health diagnosis and concerns, Facilitate patient progression through stages of change regarding substance use diagnoses and concerns, Identify triggers associated with mental health/substance abuse issues, and Increase skills for wellness and recovery  Therapeutic Interventions: Assess for all discharge needs, 1 to 1 time with Social worker, Explore available resources and support systems, Assess for adequacy in community support network, Educate family and significant other(s) on suicide prevention, Complete Psychosocial Assessment, Interpersonal group therapy.  Evaluation of Outcomes: Not Progressing   Progress in Treatment: Attending groups: Yes. and No. Participating in groups: Yes. and No. Taking medication as prescribed: Yes. Toleration medication:  Yes. Family/Significant other contact made: Yes, individual(s) contacted:  Yes, pt's sister and mother  Patient understands diagnosis: No. Discussing patient identified problems/goals with staff: Yes. Medical problems stabilized or resolved: Yes. Denies suicidal/homicidal ideation: Yes. Issues/concerns per patient self-inventory: Yes. Other: none   New problem(s) identified: No, Describe:  none   New Short Term/Long Term Goal(s):elimination of symptoms of psychosis, medication management for mood stabilization; elimination  of SI thoughts; development of comprehensive mental wellness plan. 06/29/24 Update: No changes at this time. Update 07/04/24: No changes at this time Update 07/10/24: No changes at this time Update 07/10/24: No changes at this time 07/15/24 no changes Update 07/20/24: No changes at this time 07/15/24 no changes Update 07/25/24: No changes at this time Update 07/30/24: No changes at this time. Update 08/04/24: No changes at this time.     Patient Goals:    To continue to get well 06/29/24 Update: No changes at this time. Update 07/04/24: No changes at this time Update 07/10/24: No changes at this time Update 07/10/24: No changes at this time 07/15/24 no changes Update 07/20/24: No changes at this time 07/15/24 no changes Update 07/25/24: No changes at this time: Update 07/30/24: No changes at this time.  Update 08/04/24: No changes at this time.     Discharge Plan or Barriers: CSW will assist with appropriate discharge planning 06/29/24 Update:  CSW has made APS report to [facility] APs following safety concerns. CSW to continue to coordinate with DSS worker to continue to assess. Update 07/04/24: No changes at this time Update 07/10/24: No changes at this time Update 07/10/24: DSS reports no changes at this time, DSS unsure if pt's family has filed for the guardianship. Caseworker Adams Louder reports she will check in with CSW after New Years 07/15/2024 no changes Update 07/20/24: Pt's  family filed for guardianship, court date scheduled for 08/25/23 Update 07/25/24: No changes at this time. Update 07/30/24: No changes at this time.  Update 08/04/24: No changes at this time.     Reason for Continuation of Hospitalization: Mania Medication stabilization    Estimated Length of Stay: TBD 07/15/24 Update 07/20/24: TBD Update 07/25/24: TBD Update 07/30/24: TBD  Update 08/04/24: TBD    Last 3 [place] Suicide Severity Risk Score: Flowsheet Row Admission (Current) from 06/19/2024 in [facility] from 06/18/2024 in [facility] Emergency Department at [facility] ED from 05/24/2022 in [facility] Emergency Department at [facility]  C-SSRS RISK CATEGORY No Risk No Risk No Risk    Last PHQ 2/9 Scores:    12/21/2022    9:00 AM  Depression screen PHQ 2/9  Decreased Interest 0  Down, Depressed, Hopeless 0  PHQ - 2 Score 0    Scribe for Treatment Team: Lum JONETTA Croft, ISRA[place] 08/04/2024 1:53 PM

## 2024-08-04 NOTE — Plan of Care (Signed)
" °  Problem: Activity: Goal: Interest or engagement in activities will improve Outcome: Progressing Goal: Sleeping patterns will improve Outcome: Progressing   Problem: Self-Concept: Goal: Level of anxiety will decrease Outcome: Not Progressing Goal: Ability to modify response to factors that promote anxiety will improve Outcome: Not Progressing   "

## 2024-08-04 NOTE — Progress Notes (Signed)
" °   08/04/24 0600  15 Minute Checks  Location Bedroom  Visual Appearance Calm  Behavior Sleeping  Sleep (Behavioral Health Patients Only)  Calculate sleep? (Click Yes once per 24 hr at 0600 safety check) Yes  OTHER  Documented sleep last 24 hours 7.5    "

## 2024-08-04 NOTE — Group Note (Signed)
 Date:  08/04/2024 Time:  10:17 PM  Group Topic/Focus:  Goals Group:   The focus of this group is to help patients establish daily goals to achieve during treatment and discuss how the patient can incorporate goal setting into their daily lives to aide in recovery.    Participation Level:  Active  Participation Quality:  Appropriate  Affect:  Appropriate  Cognitive:  Appropriate  Insight: Good  Engagement in Group:  Engaged  Modes of Intervention:  Discussion  Additional Comments:    Romero Earnie Hope 08/04/2024, 10:17 PM

## 2024-08-05 DIAGNOSIS — F250 Schizoaffective disorder, bipolar type: Secondary | ICD-10-CM | POA: Diagnosis not present

## 2024-08-05 LAB — GLUCOSE, CAPILLARY: Glucose-Capillary: 115 mg/dL — ABNORMAL HIGH (ref 70–99)

## 2024-08-05 NOTE — Group Note (Signed)
 Date:  08/05/2024 Time:  4:22 PM  Group Topic/Focus:  Coping With Mental Health Crisis:   The purpose of this group is to help patients identify strategies for coping with mental health crisis.  Group discusses possible causes of crisis and ways to manage them effectively.  Today I facilitated a group activity that encouraged patient participation through a Family Feud-style game. Patients answered questions focused on enjoyable activities, hobbies, and places they like to go for fun. This activity promoted discussion about healthy coping mechanisms, leisure skills, and positive habits. Patients were encouraged to reflect on fun moments in their lives, with several emphasizing holidays such as Thanksgiving and Christmas, particularly in relation to cooking and spending time with family. Patients were engaged, cooperative, and actively participated throughout the group.    Participation Level:  Active  Participation Quality:  Appropriate  Affect:  Appropriate  Cognitive:  Appropriate  Insight: Appropriate  Engagement in Group:  Limited  Modes of Intervention:  Activity and Discussion  Additional Comments:    Leonard Hendler L Madison Albea 08/05/2024, 4:22 PM

## 2024-08-05 NOTE — Group Note (Signed)
 Date:  08/05/2024 Time:  10:05 AM  Group Topic/Focus:  Movement Therapy, Morning Stretch with Annali Lybrand.    Participation Level:  Did Not Attend    [name] 08/05/2024, 10:05 AM

## 2024-08-05 NOTE — Plan of Care (Signed)
   Problem: Activity: Goal: Interest or engagement in activities will improve Outcome: Progressing Goal: Sleeping patterns will improve Outcome: Progressing

## 2024-08-05 NOTE — Plan of Care (Signed)
   Problem: Education: Goal: Emotional status will improve Outcome: Progressing Goal: Mental status will improve Outcome: Progressing

## 2024-08-05 NOTE — Progress Notes (Signed)
 [facility] MD Progress Note   11:40 PM Valerie [name]  MRN:  [id number]   Valerie [name] is a 64 year old female presenting voluntary to [facility] requesting a psychiatric evaluation for worsening psychosis. Patient has history of schizophrenia. Patient denied SI, HI, psychosis and alcohol/drug usage. Patient resides at [facility]. Patient reports people are coming by my door talking loud, yelling and screaming and coming in my room with keys. Patient reports having multiple medical Afibs and blood clots due to this. Patient reports people are doing things in the dining room. When asked if she called EMS, patient stated I called EMS, I stripped searched over the phone and called the authorities. Patient is admitted to [facility] unit with Q15 min safety monitoring. Multidisciplinary team approach is offered. Medication management; group/milieu therapy is offered.   [name] : [phone]- called on 07/28/24  to update the med changes but went to generic voicemail  Subjective:  Chart reviewed, case discussed in multidisciplinary meeting, patient seen during rounds.   On interview patient is noted to be sitting in the dining area.  She informs the provider that today's labs were day and she wishes to see her family.  Discussed today about giving permission for family to visit patient's outside the visiting hours.  Patient denies hallucinations and denies SI/HI/plan.  She remains tangential and unpredictable at times on the unit and delusional.  Past Psychiatric History: see h&P Family History:  Family History  Problem Relation Age of Onset   Colon cancer Neg Hx    Celiac disease Neg Hx    Inflammatory bowel disease Neg Hx    Social History:  Social History   Substance and Sexual Activity  Alcohol Use Never     Social History   Substance and Sexual Activity  Drug Use Never    Social History   Socioeconomic History   Marital status: Single    Spouse  name: Not on file   Number of children: Not on file   Years of education: Not on file   Highest education level: Not on file  Occupational History   Not on file  Tobacco Use   Smoking status: Never   Smokeless tobacco: Never  Substance and Sexual Activity   Alcohol use: Never   Drug use: Never   Sexual activity: Not Currently    Birth control/protection: Pill  Other Topics Concern   Not on file  Social History Narrative   Not on file   Social Drivers of Health   Tobacco Use: Low Risk (06/19/2024)   Patient History    Smoking Tobacco Use: Never    Smokeless Tobacco Use: Never    Passive Exposure: Not on file  Financial Resource Strain: Not on file  Food Insecurity: Unknown (06/19/2024)   Epic    Worried About Programme Researcher, Broadcasting/film/video in the Last Year: Never true    [employer] of Food in the Last Year: Patient declined  Transportation Needs: No Transportation Needs (06/19/2024)   Epic    Lack of Transportation (Medical): No    Lack of Transportation (Non-Medical): No  Physical Activity: Not on file  Stress: Not on file  Social Connections: Not on file  Depression (PHQ2-9): Low Risk (12/21/2022)   Depression (PHQ2-9)    PHQ-2 Score: 0  Alcohol Screen: Low Risk (06/19/2024)   Alcohol Screen    Last Alcohol Screening Score (AUDIT): 0  Housing: Low Risk (06/19/2024)   Epic    Unable  to Pay for Housing in the Last Year: No    Number of Times Moved in the Last Year: 0    Homeless in the Last Year: No  Utilities: Not At Risk (06/19/2024)   Epic    Threatened with loss of utilities: No  Health Literacy: Not on file   Past Medical History:  Past Medical History:  Diagnosis Date   Chronic lower extremity pain (2ry area of Pain) (Right) 12/21/2022   Chronic pain syndrome 12/20/2022   DDD (degenerative disc disease), lumbosacral 12/21/2022   HTN (hypertension)    Hypercholesteremia    Prediabetes    Schizophrenia (HCC)     Past Surgical History:  Procedure Laterality Date    COLONOSCOPY  2014   Dr. Tobie: Pancolonic diverticulosis, tortuous colon, next colonoscopy 2024   TONSILLECTOMY      Current Medications: Current Facility-Administered Medications  Medication Dose Route Frequency Provider Last Rate Last Admin   acetaminophen  (TYLENOL ) tablet 650 mg  650 mg Oral Q6H PRN Onuoha, Chinwendu V, NP   650 mg at 07/11/24 1835   ARIPiprazole  (ABILIFY ) tablet 30 mg  30 mg Oral Daily Brinsley Wence, MD   30 mg at 08/05/24 9051   aspirin  EC tablet 81 mg  81 mg Oral Daily Bobbitt, Shalon E, NP   81 mg at 08/05/24 0948   cholecalciferol  (VITAMIN D3) 25 MCG (1000 UNIT) tablet 1,000 Units  1,000 Units Oral Weekly Astria Jordahl, MD   1,000 Units at 08/01/24 1129   cloZAPine  (CLOZARIL ) tablet 150 mg  150 mg Oral BID Deontez Klinke, MD   150 mg at 08/05/24 2122   Or   OLANZapine  (ZYPREXA ) injection 10 mg  10 mg Intramuscular BID Melony Tenpas, MD       dapagliflozin  propanediol (FARXIGA ) tablet 10 mg  10 mg Oral Daily Josette Ade, MD   10 mg at 08/05/24 9052   diclofenac  Sodium (VOLTAREN ) 1 % topical gel 4 g  4 g Topical QID PRN Madaram, Kondal R, MD       famotidine  (PEPCID ) tablet 40 mg  40 mg Oral Daily Shrivastava, Aryendra, MD   40 mg at 08/05/24 0949   fluticasone  (FLONASE ) 50 MCG/ACT nasal spray 1 spray  1 spray Each Nare Daily PRN Madaram, Kondal R, MD       furosemide  (LASIX ) tablet 40 mg  40 mg Oral 2 times per day Josette Ade, MD   40 mg at 08/05/24 1651   gabapentin  (NEURONTIN ) capsule 300 mg  300 mg Oral TID Bobbitt, Shalon E, NP   300 mg at 08/05/24 2122   hydrOXYzine  (ATARAX ) tablet 25 mg  25 mg Oral TID PRN Bobbitt, Shalon E, NP   25 mg at 08/02/24 0155   magnesium  hydroxide (MILK OF MAGNESIA) suspension 30 mL  30 mL Oral Daily PRN Bobbitt, Shalon E, NP       metoprolol  succinate (TOPROL -XL) 24 hr tablet 25 mg  25 mg Oral BID Josette Ade, MD   25 mg at 08/05/24 2122   multivitamin with minerals tablet 1 tablet  1 tablet Oral Daily Bobbitt,  Shalon E, NP   1 tablet at 08/05/24 0948   naproxen  (NAPROSYN ) tablet 375 mg  375 mg Oral TID WC Debbrah Sampedro, MD   375 mg at 08/05/24 1652   OLANZapine  (ZYPREXA ) injection 5 mg  5 mg Intramuscular TID PRN Bobbitt, Shalon E, NP   5 mg at 06/22/24 2259   OLANZapine  zydis (ZYPREXA ) disintegrating tablet 5 mg  5 mg  Oral TID PRN Bobbitt, Shalon E, NP   5 mg at 08/02/24 0155   ondansetron  (ZOFRAN ) tablet 4 mg  4 mg Oral Q8H PRN Bobbitt, Shalon E, NP   4 mg at 07/11/24 1835   Oxcarbazepine  (TRILEPTAL ) tablet 300 mg  300 mg Oral BID Rei Medlen, MD   300 mg at 08/05/24 2122   potassium chloride  SA (KLOR-CON  M) CR tablet 20 mEq  20 mEq Oral BID Bobbitt, Shalon E, NP   20 mEq at 08/05/24 2122   rosuvastatin  (CRESTOR ) tablet 5 mg  5 mg Oral Daily Bobbitt, Shalon E, NP   5 mg at 08/05/24 9050   traZODone  (DESYREL ) tablet 50 mg  50 mg Oral QHS PRN Bobbitt, Shalon E, NP   50 mg at 08/02/24 2108   zolpidem  (AMBIEN ) tablet 10 mg  10 mg Oral QHS Druscilla Petsch, MD   10 mg at 08/05/24 2122    Lab Results:  Results for orders placed or performed during the hospital encounter of 06/19/24 (from the past 48 hours)  Glucose, capillary     Status: Abnormal   Collection Time: 08/04/24  7:19 AM  Result Value Ref Range   Glucose-Capillary 116 (H) 70 - 99 mg/dL    Comment: Glucose reference range applies only to samples taken after fasting for at least 8 hours.  Glucose, capillary     Status: Abnormal   Collection Time: 08/05/24  7:47 AM  Result Value Ref Range   Glucose-Capillary 115 (H) 70 - 99 mg/dL    Comment: Glucose reference range applies only to samples taken after fasting for at least 8 hours.        Blood Alcohol level:  Lab Results  Component Value Date   [facility] <15 06/18/2024    Metabolic Disorder Labs: Lab Results  Component Value Date   HGBA1C 5.9 (H) 06/28/2024   MPG 122.63 06/28/2024   No results found for: PROLACTIN Lab Results  Component Value Date   CHOL 156 06/28/2024    TRIG 89 06/28/2024   HDL 70 06/28/2024   CHOLHDL 2.2 06/28/2024   VLDL 18 06/28/2024   LDLCALC 69 06/28/2024    Physical Findings: AIMS:  , ,  ,  ,    CIWA:    COWS:      Psychiatric Specialty Exam:  Presentation  General Appearance:  Casual  Eye Contact: Fleeting  Speech: Normal Rate  Speech Volume: Increased    Mood and Affect  Mood: Euphoric  Affect: stable  Thought Process  Thought Processes: Improving illogical  Orientation:Full (Time, Place and Person)  Thought Content:Illogical; Delusions; Tangential  Hallucinations: Denies  Ideas of Reference:Delusions  Suicidal Thoughts: Denies  Homicidal Thoughts: Denies   Sensorium  Memory: Immediate Fair; Remote Fair  Judgment: Impaired  Insight: Shallow   Executive Functions  Concentration: Poor  Attention Span: Poor  Recall: [employer] of Knowledge: Fair  Language: Fair   Psychomotor Activity  Psychomotor Activity: No data recorded  Musculoskeletal: Strength & Muscle Tone: within normal limits Gait & Station: normal Assets  Assets: Manufacturing Systems Engineer; Desire for Improvement    Physical Exam: Physical Exam Vitals and nursing note reviewed.    ROS Blood pressure 106/71, pulse 88, temperature (!) 97.3 F (36.3 C), resp. rate 17, height 5' 5 (1.651 m), weight 87.3 kg, SpO2 100%. Body mass index is 32.03 kg/m.  Diagnosis: Principal Problem:   Bilateral lower extremity edema Active Problems:   Hypercholesteremia   Schizoaffective disorder, bipolar type (HCC)  Chest pain   Sinus tachycardia   Hypotension   Controlled type 2 diabetes mellitus without complication, without long-term current use of insulin (HCC)   Hyponatremia  Schizoaffective disorder bipolar type  Clinical Decision Making: Patient is currently admitted for worsening psychosis, manic symptoms, tangential grandiosity.  She needs inpatient hospitalization for medication management and close  monitoring.  Patient is unable to make decisions on her own as she is unable to appreciate and understand her mental health history, the current need for treatment.  [facility] has screened her case and for further evaluation for legal guardianship. [facility] screened in her case. Sister and mom are involved in her care.  Treatment Plan Summary:  Patient is not responding at all to Clozaril  in spite of increased dose and also poor response to Geodon  which was discontinued and started on Abilify  which was titrated to 20 mg today.  If patient remains manic/psychotic we will discuss a plan to switch Clozaril  to loxapine as patient has documented allergy to Risperdal/paliperidone. Safety and Monitoring:             -- inoluntary admission to inpatient psychiatric unit for safety, stabilization and treatment             -- Daily contact with patient to assess and evaluate symptoms and progress in treatment             -- Patient's case to be discussed in multi-disciplinary team meeting             -- Observation Level: q15 minute checks             -- Vital signs:  q12 hours             -- Precautions: suicide, elopement, and assault   2. Psychiatric Diagnoses and Treatment:  Discontinue Depakote  as patient is noncompliant Discontinue Geodon  20 mg nightly as poor response Increased Clozaril  150mg  twice daily enforced with zyprexa  10mg  BID- Cymbalta  40 mg daily-discontinue to minimize manic Trilepta 300 mg twice daily Abilify  increased to 30 mg for optimization and transition to LAI Abilify  Maintena -- The risks/benefits/side-effects/alternatives to this medication were discussed in detail with the patient and time was given for questions. The patient consents to medication trial.                -- Metabolic profile and EKG monitoring obtained while on an atypical antipsychotic (BMI: Lipid Panel: HbgA1c: QTc:)              -- Encouraged patient to participate in unit milieu and in scheduled group therapies                             3. Medical Issues Being Addressed:   Chest pain Sinus tachycardia HTN (hypertension) Bilateral lower extremity edema Hypercholesteremia  12/30:  Troponin negative x 2. Echocardiogram done with pending results.  Small improvement in lower extremity edema so continuing p.o. Lasix .  Will need BMP every other day.  4. Discharge Planning:   -- Social work and case management to assist with discharge planning and identification of hospital follow-up needs prior to discharge  -- Estimated LOS: 3-4 days  Allyn Foil, MD 08/05/2024, 11:40 PM

## 2024-08-05 NOTE — Progress Notes (Signed)
 SI/HI/AVH: denies all  Behavior/Mood: intrusive and impulsive/labile and preoccupied    Interaction/Group attendance:childlike and intrusive/ 1 of 3 groups   Medication/PRNs: compliant/none   Pain: denies  Other: Patient excited to speak to her mother on the phone as today is her mother's bday.

## 2024-08-05 NOTE — Group Note (Signed)
 Date:  08/05/2024 Time:  8:41 PM  Group Topic/Focus:  Coping With Mental Health Crisis:   The purpose of this group is to help patients identify strategies for coping with mental health crisis.  Group discusses possible causes of crisis and ways to manage them effectively. Healthy Communication:   The focus of this group is to discuss communication, barriers to communication, as well as healthy ways to communicate with others. Overcoming Stress:   The focus of this group is to define stress and help patients assess their triggers. Self Care:   The focus of this group is to help patients understand the importance of self-care in order to improve or restore emotional, physical, spiritual, interpersonal, and financial health.    Participation Level:  Active  Participation Quality:  Appropriate  Affect:  Appropriate  Cognitive:  Alert and Appropriate  Insight: Appropriate  Engagement in Group:  Engaged and Supportive  Modes of Intervention:  Discussion and Support  Additional Comments:  Pt did attend tonight's wrap up group. Will to continue to encourage pt to attend group sessions and participate In discussions with peers.  Brad GORMAN Ryder 08/05/2024, 8:41 PM

## 2024-08-05 NOTE — Progress Notes (Signed)
" °   08/05/24 0402  Psych Admission Type (Psych Patients Only)  Admission Status Involuntary  Psychosocial Assessment  Patient Complaints Anxiety  Eye Contact Fair  Facial Expression Animated  Affect Labile  Speech Tangential  Interaction Childlike;Intrusive  Motor Activity Slow  Appearance/Hygiene In scrubs  Behavior Characteristics Impulsive;Intrusive;Irritable  Mood Labile  Thought Process  Coherency Disorganized  Content Preoccupation  Delusions Grandeur;Paranoid  Perception Hallucinations  Hallucination Auditory  Judgment Impaired  Confusion Mild  Danger to Self  Current suicidal ideation? Denies  Agreement Not to Harm Self Yes  Description of Agreement verbal  Danger to Others  Danger to Others None reported or observed    "

## 2024-08-05 NOTE — Group Note (Signed)
 Date:  08/05/2024 Time:  2:18 PM  Group Topic/Focus:  Coping With Mental Health Crisis:   The purpose of this group is to help patients identify strategies for coping with mental health crisis.  Group discusses possible causes of crisis and ways to manage them effectively.    Participation Level:  Did Not Attend    [name] 08/05/2024, 2:18 PM

## 2024-08-06 DIAGNOSIS — F250 Schizoaffective disorder, bipolar type: Secondary | ICD-10-CM | POA: Diagnosis not present

## 2024-08-06 NOTE — Group Note (Signed)
 Date:  08/06/2024 Time:  8:58 PM  Group Topic/Focus:  Wrap-Up Group:   The focus of this group is to help patients review their daily goal of treatment and discuss progress on daily workbooks.    Participation Level:  Active  Participation Quality:  Appropriate  Affect:  Appropriate  Cognitive:  Alert  Insight: Appropriate  Engagement in Group:  Engaged  Modes of Intervention:  Discussion  Additional Comments:    Valerie [name] 08/06/2024, 8:58 PM

## 2024-08-06 NOTE — Progress Notes (Signed)
" °   08/06/24 1300  Psych Admission Type (Psych Patients Only)  Admission Status Involuntary  Psychosocial Assessment  Patient Complaints Restlessness  Eye Contact Fair  Facial Expression Animated  Affect Labile  Speech Tangential  Interaction Childlike;Intrusive  Motor Activity Slow  Appearance/Hygiene In scrubs  Behavior Characteristics Intrusive  Mood Labile  Thought Process  Coherency Disorganized  Content Preoccupation  Delusions Paranoid;Grandeur  Perception Hallucinations  Hallucination Auditory  Judgment Impaired  Confusion Mild  Danger to Self  Current suicidal ideation? Denies  Danger to Others  Danger to Others None reported or observed    "

## 2024-08-06 NOTE — Plan of Care (Signed)
   Problem: Activity: Goal: Interest or engagement in activities will improve Outcome: Progressing Goal: Sleeping patterns will improve Outcome: Progressing

## 2024-08-06 NOTE — Group Note (Signed)
 Date:  08/06/2024 Time:  10:41 AM  Group Topic/Focus:  Coping With Mental Health Crisis:   The purpose of this group is to help patients identify strategies for coping with mental health crisis.  Group discusses possible causes of crisis and ways to manage them effectively.  I led a group session focused on engagement, communication, and positive coping skills. The session began with an icebreaker activity, Two Truths and One Lie, which allowed participants the opportunity to learn more about one another and build rapport in a supportive environment.  I then guided the group through Would You Rather and What Would You Do activities to encourage critical thinking, decision-making, and discussion of real-life situations. Participants were prompted to explore positive perspectives, problem-solving strategies, and healthy responses to challenging circumstances.  Group members were engaged throughout the session, demonstrated appropriate interactions, and actively participated in discussions. Overall, the group showed cooperation, insight, and openness to identifying positive ways to approach difficult situations.   Participation Level:  Active  Participation Quality:  Inattentive  Affect:  Appropriate  Cognitive:  Appropriate  Insight: Limited  Engagement in Group:  Lacking and Limited  Modes of Intervention:  Activity and Discussion  Additional Comments:    Dvaughn Fickle L Joud Pettinato 08/06/2024, 10:41 AM

## 2024-08-06 NOTE — Plan of Care (Signed)
   Problem: Education: Goal: Knowledge of Valerie [name] General Education information/materials will improve Outcome: Progressing Goal: Emotional status will improve Outcome: Progressing Goal: Mental status will improve Outcome: Progressing Goal: Verbalization of understanding the information provided will improve Outcome: Progressing   Problem: Activity: Goal: Interest or engagement in activities will improve Outcome: Progressing Goal: Sleeping patterns will improve Outcome: Progressing   Problem: Coping: Goal: Ability to verbalize frustrations and anger appropriately will improve Outcome: Progressing Goal: Ability to demonstrate self-control will improve Outcome: Progressing

## 2024-08-06 NOTE — Progress Notes (Addendum)
 [facility] MD Progress Note   8:39 PM Valerie [name]  MRN:  [id number]   Valerie [name] is a 64 year old female presenting voluntary to [facility] requesting a psychiatric evaluation for worsening psychosis. Patient has history of schizophrenia. Patient denied SI, HI, psychosis and alcohol/drug usage. Patient resides at [facility]. Patient reports people are coming by my door talking loud, yelling and screaming and coming in my room with keys. Patient reports having multiple medical Afibs and blood clots due to this. Patient reports people are doing things in the dining room. When asked if she called EMS, patient stated I called EMS, I stripped searched over the phone and called the authorities. Patient is admitted to [facility] unit with Q15 min safety monitoring. Multidisciplinary team approach is offered. Medication management; group/milieu therapy is offered.   Sister Nereyda Bowler : [phone]- called on 07/28/24  to update the med changes but went to generic voicemail  Subjective:  Chart reviewed, case discussed in multidisciplinary meeting, patient seen during rounds.   Today patient is noted to be walking in the hallway screaming and demanding to talk to the provider.  When provider tried to redirect her that she is with another patient and provider will come by to talk to her patient visibly became upset and started screaming that she needs to be talk to.  When provider went into her room she remained calm but reports that she needs to leave the hospital.  Provider updated her that her sister and mom will be coming during daytime under special visitation hours to check on her patient remains discharge focused and informs the provider that she makes her own decisions.  Patient acknowledged taking all her medications and denies any side effects.  She denies SI/HI/plan and denies hallucinations.  Past Psychiatric History: see h&P Family History:  Family History  Problem  Relation Age of Onset   Colon cancer Neg Hx    Celiac disease Neg Hx    Inflammatory bowel disease Neg Hx    Social History:  Social History   Substance and Sexual Activity  Alcohol Use Never     Social History   Substance and Sexual Activity  Drug Use Never    Social History   Socioeconomic History   Marital status: Single    Spouse name: Not on file   Number of children: Not on file   Years of education: Not on file   Highest education level: Not on file  Occupational History   Not on file  Tobacco Use   Smoking status: Never   Smokeless tobacco: Never  Substance and Sexual Activity   Alcohol use: Never   Drug use: Never   Sexual activity: Not Currently    Birth control/protection: Pill  Other Topics Concern   Not on file  Social History Narrative   Not on file   Social Drivers of Health   Tobacco Use: Low Risk (06/19/2024)   Patient History    Smoking Tobacco Use: Never    Smokeless Tobacco Use: Never    Passive Exposure: Not on file  Financial Resource Strain: Not on file  Food Insecurity: Unknown (06/19/2024)   Epic    Worried About Programme Researcher, Broadcasting/film/video in the Last Year: Never true    [employer] of Food in the Last Year: Patient declined  Transportation Needs: No Transportation Needs (06/19/2024)   Epic    Lack of Transportation (Medical): No    Lack of Transportation (Non-Medical): No  Physical Activity: Not on file  Stress: Not on file  Social Connections: Not on file  Depression (PHQ2-9): Low Risk (12/21/2022)   Depression (PHQ2-9)    PHQ-2 Score: 0  Alcohol Screen: Low Risk (06/19/2024)   Alcohol Screen    Last Alcohol Screening Score (AUDIT): 0  Housing: Low Risk (06/19/2024)   Epic    Unable to Pay for Housing in the Last Year: No    Number of Times Moved in the Last Year: 0    Homeless in the Last Year: No  Utilities: Not At Risk (06/19/2024)   Epic    Threatened with loss of utilities: No  Health Literacy: Not on file   Past Medical History:   Past Medical History:  Diagnosis Date   Chronic lower extremity pain (2ry area of Pain) (Right) 12/21/2022   Chronic pain syndrome 12/20/2022   DDD (degenerative disc disease), 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 Tobie: Pancolonic diverticulosis, tortuous colon, next colonoscopy 2024   TONSILLECTOMY      Current Medications: Current Facility-Administered Medications  Medication Dose Route Frequency Provider Last Rate Last Admin   acetaminophen  (TYLENOL ) tablet 650 mg  650 mg Oral Q6H PRN Onuoha, Chinwendu V, NP   650 mg at 07/11/24 1835   ARIPiprazole  (ABILIFY ) tablet 30 mg  30 mg Oral Daily Mariadelaluz Guggenheim, MD   30 mg at 08/06/24 9077   aspirin  EC tablet 81 mg  81 mg Oral Daily Bobbitt, Shalon E, NP   81 mg at 08/06/24 9077   cholecalciferol  (VITAMIN D3) 25 MCG (1000 UNIT) tablet 1,000 Units  1,000 Units Oral Weekly Jermy Couper, MD   1,000 Units at 08/01/24 1129   cloZAPine  (CLOZARIL ) tablet 150 mg  150 mg Oral BID Joyel Chenette, MD   150 mg at 08/06/24 9068   Or   OLANZapine  (ZYPREXA ) injection 10 mg  10 mg Intramuscular BID Annalia Metzger, MD       dapagliflozin  propanediol (FARXIGA ) tablet 10 mg  10 mg Oral Daily Josette Ade, MD   10 mg at 08/06/24 9068   diclofenac  Sodium (VOLTAREN ) 1 % topical gel 4 g  4 g Topical QID PRN Madaram, Kondal R, MD       famotidine  (PEPCID ) tablet 40 mg  40 mg Oral Daily Shrivastava, Aryendra, MD   40 mg at 08/06/24 9077   fluticasone  (FLONASE ) 50 MCG/ACT nasal spray 1 spray  1 spray Each Nare Daily PRN Madaram, Kondal R, MD       furosemide  (LASIX ) tablet 40 mg  40 mg Oral 2 times per day Josette Ade, MD   40 mg at 08/06/24 1646   gabapentin  (NEURONTIN ) capsule 300 mg  300 mg Oral TID Bobbitt, Shalon E, NP   300 mg at 08/06/24 1646   hydrOXYzine  (ATARAX ) tablet 25 mg  25 mg Oral TID  PRN Bobbitt, Shalon E, NP   25 mg at 08/02/24 0155   magnesium  hydroxide (MILK OF MAGNESIA) suspension 30 mL  30 mL Oral Daily PRN Bobbitt, Shalon E, NP   30 mL at 08/06/24 1047   metoprolol  succinate (TOPROL -XL) 24 hr tablet 25 mg  25 mg Oral BID Josette Ade, MD   25 mg at 08/06/24 9077   multivitamin with minerals tablet 1 tablet  1 tablet Oral Daily Bobbitt, Shalon E, NP  1 tablet at 08/06/24 9077   naproxen  (NAPROSYN ) tablet 375 mg  375 mg Oral TID WC Peyten Weare, MD   375 mg at 08/06/24 1646   OLANZapine  (ZYPREXA ) injection 5 mg  5 mg Intramuscular TID PRN Bobbitt, Shalon E, NP   5 mg at 06/22/24 2259   OLANZapine  zydis (ZYPREXA ) disintegrating tablet 5 mg  5 mg Oral TID PRN Bobbitt, Shalon E, NP   5 mg at 08/02/24 0155   ondansetron  (ZOFRAN ) tablet 4 mg  4 mg Oral Q8H PRN Bobbitt, Shalon E, NP   4 mg at 07/11/24 1835   Oxcarbazepine  (TRILEPTAL ) tablet 300 mg  300 mg Oral BID Orel Hord, MD   300 mg at 08/06/24 0931   potassium chloride  SA (KLOR-CON  M) CR tablet 20 mEq  20 mEq Oral BID Bobbitt, Shalon E, NP   20 mEq at 08/06/24 9077   rosuvastatin  (CRESTOR ) tablet 5 mg  5 mg Oral Daily Bobbitt, Shalon E, NP   5 mg at 08/06/24 9078   traZODone  (DESYREL ) tablet 50 mg  50 mg Oral QHS PRN Bobbitt, Shalon E, NP   50 mg at 08/02/24 2108   zolpidem  (AMBIEN ) tablet 10 mg  10 mg Oral QHS Gadiel John, MD   10 mg at 08/05/24 2122    Lab Results:  Results for orders placed or performed during the hospital encounter of 06/19/24 (from the past 48 hours)  Glucose, capillary     Status: Abnormal   Collection Time: 08/05/24  7:47 AM  Result Value Ref Range   Glucose-Capillary 115 (H) 70 - 99 mg/dL    Comment: Glucose reference range applies only to samples taken after fasting for at least 8 hours.        Blood Alcohol level:  Lab Results  Component Value Date   [facility] <15 06/18/2024    Metabolic Disorder Labs: Lab Results  Component Value Date   HGBA1C 5.9 (H) 06/28/2024    MPG 122.63 06/28/2024   No results found for: PROLACTIN Lab Results  Component Value Date   CHOL 156 06/28/2024   TRIG 89 06/28/2024   HDL 70 06/28/2024   CHOLHDL 2.2 06/28/2024   VLDL 18 06/28/2024   LDLCALC 69 06/28/2024    Physical Findings: AIMS:  , ,  ,  ,    CIWA:    COWS:      Psychiatric Specialty Exam:  Presentation  General Appearance:  Casual  Eye Contact: Fleeting  Speech: Normal Rate  Speech Volume: Increased    Mood and Affect  Mood: Euphoric  Affect: stable  Thought Process  Thought Processes: Improving illogical  Orientation:Full (Time, Place and Person)  Thought Content:Illogical; Delusions; Tangential  Hallucinations: Denies  Ideas of Reference:Delusions  Suicidal Thoughts: Denies  Homicidal Thoughts: Denies   Sensorium  Memory: Immediate Fair; Remote Fair  Judgment: Impaired  Insight: Shallow   Executive Functions  Concentration: Poor  Attention Span: Poor  Recall: [employer] of Knowledge: Fair  Language: Fair   Psychomotor Activity  Psychomotor Activity: No data recorded  Musculoskeletal: Strength & Muscle Tone: within normal limits Gait & Station: normal Assets  Assets: Manufacturing Systems Engineer; Desire for Improvement    Physical Exam: Physical Exam Vitals and nursing note reviewed.    ROS Blood pressure (!) 156/94, pulse 96, temperature (!) 97.2 F (36.2 C), resp. rate 14, height 5' 5 (1.651 m), weight 87.3 kg, SpO2 100%. Body mass index is 32.03 kg/m.  Diagnosis: Principal Problem:   Bilateral lower extremity  edema Active Problems:   Hypercholesteremia   Schizoaffective disorder, bipolar type (HCC)   Chest pain   Sinus tachycardia   Hypotension   Controlled type 2 diabetes mellitus without complication, without long-term current use of insulin (HCC)   Hyponatremia  Schizoaffective disorder bipolar type  Clinical Decision Making: Patient is currently admitted for worsening  psychosis, manic symptoms, tangential grandiosity.  She needs inpatient hospitalization for medication management and close monitoring.  Patient is unable to make decisions on her own as she is unable to appreciate and understand her mental health history, the current need for treatment.  [facility] has screened her case and for further evaluation for legal guardianship. [facility] screened in her case. Sister and mom are involved in her care.  Treatment Plan Summary:  Patient is not responding at all to Clozaril  in spite of increased dose and also poor response to Geodon  which was discontinued and started on Abilify  which was titrated to 20 mg today.  If patient remains manic/psychotic we will discuss a plan to switch Clozaril  to loxapine as patient has documented allergy to Risperdal/paliperidone. Safety and Monitoring:             -- inoluntary admission to inpatient psychiatric unit for safety, stabilization and treatment             -- Daily contact with patient to assess and evaluate symptoms and progress in treatment             -- Patient's case to be discussed in multi-disciplinary team meeting             -- Observation Level: q15 minute checks             -- Vital signs:  q12 hours             -- Precautions: suicide, elopement, and assault   2. Psychiatric Diagnoses and Treatment:  Discontinue Depakote  as patient is noncompliant Discontinue Geodon  20 mg nightly as poor response Increased Clozaril  150mg  twice daily enforced with zyprexa  10mg  BID- Cymbalta  40 mg daily-discontinue to minimize manic Trilepta 300 mg twice daily  LAI Abilify  Maintena- given 08/04/24 -- The risks/benefits/side-effects/alternatives to this medication were discussed in detail with the patient and time was given for questions. The patient consents to medication trial.                -- Metabolic profile and EKG monitoring obtained while on an atypical antipsychotic (BMI: Lipid Panel: HbgA1c: QTc:)              -- Encouraged  patient to participate in unit milieu and in scheduled group therapies                            3. Medical Issues Being Addressed:   Chest pain Sinus tachycardia HTN (hypertension) Bilateral lower extremity edema Hypercholesteremia  12/30:  Troponin negative x 2. Echocardiogram done with pending results.  Small improvement in lower extremity edema so continuing p.o. Lasix .  Will need BMP every other day.  4. Discharge Planning:   -- Social work and case management to assist with discharge planning and identification of hospital follow-up needs prior to discharge  -- Estimated LOS: 3-4 days  Allyn Foil, MD 08/06/2024, 8:39 PM

## 2024-08-07 DIAGNOSIS — F250 Schizoaffective disorder, bipolar type: Secondary | ICD-10-CM | POA: Diagnosis not present

## 2024-08-07 LAB — GLUCOSE, CAPILLARY: Glucose-Capillary: 117 mg/dL — ABNORMAL HIGH (ref 70–99)

## 2024-08-07 NOTE — Group Note (Signed)
 Date:  08/07/2024 Time:  9:35 PM  Group Topic/Focus:  Wrap-Up Group:   The focus of this group is to help patients review their daily goal of treatment and discuss progress on daily workbooks.    Participation Level:  Active  Participation Quality:  Appropriate  Affect:  Appropriate  Cognitive:  Alert  Insight: Appropriate  Engagement in Group:  Engaged  Modes of Intervention:  Discussion  Additional Comments:    Valerie [name] 08/07/2024, 9:35 PM

## 2024-08-07 NOTE — Group Note (Signed)
 Date:  08/07/2024 Time:  9:52 AM  Group Topic/Focus:  Movement Therapy.    Participation Level:  Did Not Attend    Valerie [name] Bias 08/07/2024, 9:52 AM

## 2024-08-07 NOTE — Progress Notes (Signed)
 [name] MD Progress Note   1:39 PM Valerie [name]  MRN:  [id number]   Valerie [name] is a 64 year old female presenting voluntary to [facility] requesting a psychiatric evaluation for worsening psychosis. Patient has history of schizophrenia. Patient denied SI, HI, psychosis and alcohol/drug usage. [facility] at [name]. Patient reports people are coming by my door talking loud, yelling and screaming and coming in my room with keys. Patient reports having multiple medical Afibs and blood clots due to this. Patient reports people are doing things in the dining room. When asked if she called EMS, patient stated I called EMS, I stripped searched over the phone and called the authorities. Patient is admitted to [facility] unit with Q15 min safety monitoring. Multidisciplinary team approach is offered. Medication management; group/milieu therapy is offered.   [name] : [phone]- called on 07/28/24  to update the med changes but went to generic voicemail  Subjective:  Chart reviewed, case discussed in multidisciplinary meeting, patient seen during rounds.   Patient is noted to be sitting in the day area.  She wanted to talk to the provider multiple times throughout the day.  She reports having toenails clipped and was requesting somebody to clip the toenails.  Patient was explained that podiatry is not in-house to help with that.  Patient and provider talked about her sister and mom coming during daytime to visit her once the weather permits.  Patient remains discharge focused and is unable to process that her sister is waiting to get the guardianship and working on placement options for her.  Patient remains insistent that she needs to leave but is unable to discuss any discharge planning at this time.  Patient remains intermittent outbursts and disorganized behaviors where she will scream with no apparent reason.  She denies SI/HI/plan and denies  hallucinations.  Past Psychiatric History: see h&P Family History:  Family History  Problem Relation Age of Onset   Colon cancer Neg Hx    Celiac disease Neg Hx    Inflammatory bowel disease Neg Hx    Social History:  Social History   Substance and Sexual Activity  Alcohol Use Never     Social History   Substance and Sexual Activity  Drug Use Never    Social History   Socioeconomic History   Marital status: Single    Spouse name: Not on file   Number of children: Not on file   Years of education: Not on file   Highest education level: Not on file  Occupational History   Not on file  Tobacco Use   Smoking status: Never   Smokeless tobacco: Never  Substance and Sexual Activity   Alcohol use: Never   Drug use: Never   Sexual activity: Not Currently    Birth control/protection: Pill  Other Topics Concern   Not on file  Social History Narrative   Not on file   Social Drivers of Health   Tobacco Use: Low Risk (06/19/2024)   Patient History    Smoking Tobacco Use: Never    Smokeless Tobacco Use: Never    Passive Exposure: Not on file  Financial Resource Strain: Not on file  Food Insecurity: Unknown (06/19/2024)   Epic    Worried About Programme Researcher, Broadcasting/film/video in the Last Year: Never true    Ran Out of Food in the Last Year: Patient declined  Transportation Needs: No Transportation Needs (06/19/2024)   Epic    Lack of  Transportation (Medical): No    Lack of Transportation (Non-Medical): No  Physical Activity: Not on file  Stress: Not on file  Social Connections: Not on file  Depression (PHQ2-9): Low Risk (12/21/2022)   Depression (PHQ2-9)    PHQ-2 Score: 0  Alcohol Screen: Low Risk (06/19/2024)   Alcohol Screen    Last Alcohol Screening Score (AUDIT): 0  Housing: Low Risk (06/19/2024)   Epic    Unable to Pay for Housing in the Last Year: No    Number of Times Moved in the Last Year: 0    Homeless in the Last Year: No  Utilities: Not At Risk (06/19/2024)   Epic     Threatened with loss of utilities: No  Health Literacy: Not on file   Past Medical History:  Past Medical History:  Diagnosis Date   Chronic lower extremity pain (2ry area of Pain) (Right) 12/21/2022   Chronic pain syndrome 12/20/2022   DDD (degenerative disc disease), 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 Tobie: Pancolonic diverticulosis, tortuous colon, next colonoscopy 2024   TONSILLECTOMY      Current Medications: Current Facility-Administered Medications  Medication Dose Route Frequency Provider Last Rate Last Admin   acetaminophen  (TYLENOL ) tablet 650 mg  650 mg Oral Q6H PRN Onuoha, Chinwendu V, NP   650 mg at 07/11/24 1835   ARIPiprazole  (ABILIFY ) tablet 30 mg  30 mg Oral Daily Yamin Swingler, MD   30 mg at 08/07/24 9070   aspirin  EC tablet 81 mg  81 mg Oral Daily Bobbitt, Shalon E, NP   81 mg at 08/07/24 9071   cholecalciferol  (VITAMIN D3) 25 MCG (1000 UNIT) tablet 1,000 Units  1,000 Units Oral Weekly Melady Chow, MD   1,000 Units at 08/01/24 1129   cloZAPine  (CLOZARIL ) tablet 150 mg  150 mg Oral BID Gilmer Kaminsky, MD   150 mg at 08/07/24 9067   Or   OLANZapine  (ZYPREXA ) injection 10 mg  10 mg Intramuscular BID Margrete Delude, MD       dapagliflozin  propanediol (FARXIGA ) tablet 10 mg  10 mg Oral Daily Josette Ade, MD   10 mg at 08/07/24 9068   diclofenac  Sodium (VOLTAREN ) 1 % topical gel 4 g  4 g Topical QID PRN Madaram, Kondal R, MD       famotidine  (PEPCID ) tablet 40 mg  40 mg Oral Daily Shrivastava, Aryendra, MD   40 mg at 08/07/24 9071   fluticasone  (FLONASE ) 50 MCG/ACT nasal spray 1 spray  1 spray Each Nare Daily PRN Madaram, Kondal R, MD       furosemide  (LASIX ) tablet 40 mg  40 mg Oral 2 times per day Josette Ade, MD   40 mg at 08/07/24 9070   gabapentin  (NEURONTIN ) capsule 300 mg  300 mg Oral TID  Bobbitt, Shalon E, NP   300 mg at 08/07/24 0932   hydrOXYzine  (ATARAX ) tablet 25 mg  25 mg Oral TID PRN Bobbitt, Shalon E, NP   25 mg at 08/02/24 0155   magnesium  hydroxide (MILK OF MAGNESIA) suspension 30 mL  30 mL Oral Daily PRN Bobbitt, Shalon E, NP   30 mL at 08/06/24 1047   metoprolol  succinate (TOPROL -XL) 24 hr tablet 25 mg  25 mg Oral BID Josette Ade, MD   25 mg at 08/07/24 0934   multivitamin with minerals  tablet 1 tablet  1 tablet Oral Daily Bobbitt, Shalon E, NP   1 tablet at 08/07/24 0930   naproxen  (NAPROSYN ) tablet 375 mg  375 mg Oral TID WC Kimbrely Buckel, MD   375 mg at 08/07/24 0931   OLANZapine  (ZYPREXA ) injection 5 mg  5 mg Intramuscular TID PRN Bobbitt, Shalon E, NP   5 mg at 06/22/24 2259   OLANZapine  zydis (ZYPREXA ) disintegrating tablet 5 mg  5 mg Oral TID PRN Bobbitt, Shalon E, NP   5 mg at 08/02/24 0155   ondansetron  (ZOFRAN ) tablet 4 mg  4 mg Oral Q8H PRN Bobbitt, Shalon E, NP   4 mg at 07/11/24 1835   Oxcarbazepine  (TRILEPTAL ) tablet 300 mg  300 mg Oral BID Kylena Mole, MD   300 mg at 08/07/24 9065   potassium chloride  SA (KLOR-CON  M) CR tablet 20 mEq  20 mEq Oral BID Bobbitt, Shalon E, NP   20 mEq at 08/07/24 0930   rosuvastatin  (CRESTOR ) tablet 5 mg  5 mg Oral Daily Bobbitt, Shalon E, NP   5 mg at 08/07/24 0932   traZODone  (DESYREL ) tablet 50 mg  50 mg Oral QHS PRN Bobbitt, Shalon E, NP   50 mg at 08/02/24 2108   zolpidem  (AMBIEN ) tablet 10 mg  10 mg Oral QHS Briawna Carver, MD   10 mg at 08/06/24 2119    Lab Results:  Results for orders placed or performed during the [name] encounter of 06/19/24 (from the past 48 hours)  Glucose, capillary     Status: Abnormal   Collection Time: 08/07/24  7:12 AM  Result Value Ref Range   Glucose-Capillary 117 (H) 70 - 99 mg/dL    Comment: Glucose reference range applies only to samples taken after fasting for at least 8 hours.        Blood Alcohol level:  Lab Results  Component Value Date   [name] <15 06/18/2024     Metabolic Disorder Labs: Lab Results  Component Value Date   HGBA1C 5.9 (H) 06/28/2024   MPG 122.63 06/28/2024   No results found for: PROLACTIN Lab Results  Component Value Date   CHOL 156 06/28/2024   TRIG 89 06/28/2024   HDL 70 06/28/2024   CHOLHDL 2.2 06/28/2024   VLDL 18 06/28/2024   LDLCALC 69 06/28/2024    Physical Findings: AIMS:  , ,  ,  ,    CIWA:    COWS:      Psychiatric Specialty Exam:  Presentation  General Appearance:  Casual  Eye Contact: Fleeting  Speech: Normal Rate  Speech Volume: Increased    Mood and Affect  Mood: Euphoric  Affect: stable  Thought Process  Thought Processes: Improving illogical  Orientation:Full (Time, Place and Person)  Thought Content:Illogical; Delusions; Tangential  Hallucinations: Denies  Ideas of Reference:Delusions  Suicidal Thoughts: Denies  Homicidal Thoughts: Denies   Sensorium  Memory: Immediate Fair; Remote Fair  Judgment: Impaired  Insight: Shallow   Executive Functions  Concentration: Poor  Attention Span: Poor  Recall: [employer] of Knowledge: Fair  Language: Fair   Psychomotor Activity  Psychomotor Activity: No data recorded  Musculoskeletal: Strength & Muscle Tone: within normal limits Gait & Station: normal Assets  Assets: Manufacturing Systems Engineer; Desire for Improvement    Physical Exam: Physical Exam Vitals and nursing note reviewed.    ROS Blood pressure 126/81, pulse 96, temperature 97.8 F (36.6 C), resp. rate 14, height 5' 5 (1.651 m), weight 87.3 kg, SpO2 100%. Body mass index  is 32.03 kg/m.  Diagnosis: Principal Problem:   Bilateral lower extremity edema Active Problems:   Hypercholesteremia   Schizoaffective disorder, bipolar type (HCC)   Chest pain   Sinus tachycardia   Hypotension   Controlled type 2 diabetes mellitus without complication, without long-term current use of insulin (HCC)   Hyponatremia  Schizoaffective  disorder bipolar type  Clinical Decision Making: Patient is currently admitted for worsening psychosis, manic symptoms, tangential grandiosity.  She needs inpatient hospitalization for medication management and close monitoring.  Patient is unable to make decisions on her own as she is unable to appreciate and understand her mental health history, the current need for treatment.  [facility] has screened her case and for further evaluation for legal guardianship. [facility] screened in her case. Sister and mom are involved in her care.  Treatment Plan Summary:  Patient is not responding at all to Clozaril  in spite of increased dose and also poor response to Geodon  which was discontinued and started on Abilify  which was titrated to 20 mg today.  If patient remains manic/psychotic we will discuss a plan to switch Clozaril  to loxapine as patient has documented allergy to Risperdal/paliperidone. Safety and Monitoring:             -- inoluntary admission to inpatient psychiatric unit for safety, stabilization and treatment             -- Daily contact with patient to assess and evaluate symptoms and progress in treatment             -- Patient's case to be discussed in multi-disciplinary team meeting             -- Observation Level: q15 minute checks             -- Vital signs:  q12 hours             -- Precautions: suicide, elopement, and assault   2. Psychiatric Diagnoses and Treatment:  Discontinue Depakote  as patient is noncompliant Discontinue Geodon  20 mg nightly as poor response Increased Clozaril  150mg  twice daily enforced with zyprexa  10mg  BID- Cymbalta  40 mg daily-discontinue to minimize manic Trilepta 300 mg twice daily  LAI Abilify  Maintena- given 08/04/24 -- The risks/benefits/side-effects/alternatives to this medication were discussed in detail with the patient and time was given for questions. The patient consents to medication trial.                -- Metabolic profile and EKG monitoring obtained  while on an atypical antipsychotic (BMI: Lipid Panel: HbgA1c: QTc:)              -- Encouraged patient to participate in unit milieu and in scheduled group therapies                            3. Medical Issues Being Addressed:   Chest pain Sinus tachycardia HTN (hypertension) Bilateral lower extremity edema Hypercholesteremia  12/30:  Troponin negative x 2. Echocardiogram done with pending results.  Small improvement in lower extremity edema so continuing p.o. Lasix .  Will need BMP every other day.  4. Discharge Planning:   -- Social work and case management to assist with discharge planning and identification of [name] follow-up needs prior to discharge  -- Estimated LOS: 3-4 days  Allyn Foil, MD 08/07/2024, 1:39 PM

## 2024-08-08 DIAGNOSIS — F250 Schizoaffective disorder, bipolar type: Secondary | ICD-10-CM | POA: Diagnosis not present

## 2024-08-08 LAB — GLUCOSE, CAPILLARY: Glucose-Capillary: 118 mg/dL — ABNORMAL HIGH (ref 70–99)

## 2024-08-08 NOTE — Progress Notes (Signed)
 Patient cooperative with nurse this shift. Patient did have outbursts and irritability in the dayroom. Patient was redirectable, able to remove herself and return when calm. Patient accepted medications without issue. Denies SI/HI/AVH. Ongoing monitoring continues.   08/08/24 1100  Psych Admission Type (Psych Patients Only)  Admission Status Involuntary  Psychosocial Assessment  Patient Complaints Restlessness  Eye Contact Fair  Facial Expression Animated  Affect Labile  Speech Tangential  Interaction Intrusive  Motor Activity Slow  Appearance/Hygiene Unremarkable  Behavior Characteristics Intrusive  Mood Labile  Thought Process  Coherency Disorganized  Content Preoccupation  Delusions Paranoid;Grandeur  Perception UTA  Hallucination None reported or observed  Judgment Impaired  Confusion Mild  Danger to Self  Current suicidal ideation? Denies  Agreement Not to Harm Self Yes  Description of Agreement Verbal  Danger to Others  Danger to Others None reported or observed

## 2024-08-08 NOTE — Progress Notes (Signed)
" °   08/08/24 0100  Psych Admission Type (Psych Patients Only)  Admission Status Involuntary  Psychosocial Assessment  Patient Complaints None  Eye Contact Fair  Facial Expression Animated  Affect Silly  Speech Tangential  Interaction Intrusive  Motor Activity Slow  Appearance/Hygiene Unremarkable  Behavior Characteristics Intrusive  Mood Labile  Thought Process  Coherency Disorganized  Content Preoccupation  Delusions Paranoid;Grandeur  Perception Hallucinations  Hallucination Auditory  Judgment Impaired  Confusion Mild  Danger to Self  Agreement Not to Harm Self Yes  Description of Agreement Verbalized    "

## 2024-08-08 NOTE — Progress Notes (Signed)
 [facility] MD Progress Note   12:39 PM Valerie [name]  MRN:  [id number]   Valerie [name] is a 64 year old female presenting voluntary to [facility] requesting a psychiatric evaluation for worsening psychosis. Patient has history of schizophrenia. Patient denied SI, HI, psychosis and alcohol/drug usage. Patient resides at [facility]. Patient reports people are coming by my door talking loud, yelling and screaming and coming in my room with keys. Patient reports having multiple medical Afibs and blood clots due to this. Patient reports people are doing things in the dining room. When asked if she called EMS, patient stated I called EMS, I stripped searched over the phone and called the authorities. Patient is admitted to [facility] unit with Q15 min safety monitoring. Multidisciplinary team approach is offered. Medication management; group/milieu therapy is offered.   Sister Alix Lahmann : [phone]- called on 07/28/24  to update the med changes but went to generic voicemail  Subjective:  Chart reviewed, case discussed in multidisciplinary meeting, patient seen during rounds.   Patient is noted to be sitting in her room and doing word puzzles.  She reports having problems with some of the staff members as she reports that they are not helping her.  She expressed her happiness that her family is finally coming to visit her on Friday.  She remains discharge focused and wants to be discharged home.  Past Psychiatric History: see h&P Family History:  Family History  Problem Relation Age of Onset   Colon cancer Neg Hx    Celiac disease Neg Hx    Inflammatory bowel disease Neg Hx    Social History:  Social History   Substance and Sexual Activity  Alcohol Use Never     Social History   Substance and Sexual Activity  Drug Use Never    Social History   Socioeconomic History   Marital status: Single    Spouse name: Not on file   Number of children: Not on file    Years of education: Not on file   Highest education level: Not on file  Occupational History   Not on file  Tobacco Use   Smoking status: Never   Smokeless tobacco: Never  Substance and Sexual Activity   Alcohol use: Never   Drug use: Never   Sexual activity: Not Currently    Birth control/protection: Pill  Other Topics Concern   Not on file  Social History Narrative   Not on file   Social Drivers of Health   Tobacco Use: Low Risk (06/19/2024)   Patient History    Smoking Tobacco Use: Never    Smokeless Tobacco Use: Never    Passive Exposure: Not on file  Financial Resource Strain: Not on file  Food Insecurity: Unknown (06/19/2024)   Epic    Worried About Programme Researcher, Broadcasting/film/video in the Last Year: Never true    [employer] of Food in the Last Year: Patient declined  Transportation Needs: No Transportation Needs (06/19/2024)   Epic    Lack of Transportation (Medical): No    Lack of Transportation (Non-Medical): No  Physical Activity: Not on file  Stress: Not on file  Social Connections: Not on file  Depression (PHQ2-9): Low Risk (12/21/2022)   Depression (PHQ2-9)    PHQ-2 Score: 0  Alcohol Screen: Low Risk (06/19/2024)   Alcohol Screen    Last Alcohol Screening Score (AUDIT): 0  Housing: Low Risk (06/19/2024)   Epic    Unable to Pay for Housing  in the Last Year: No    Number of Times Moved in the Last Year: 0    Homeless in the Last Year: No  Utilities: Not At Risk (06/19/2024)   Epic    Threatened with loss of utilities: No  Health Literacy: Not on file   Past Medical History:  Past Medical History:  Diagnosis Date   Chronic lower extremity pain (2ry area of Pain) (Right) 12/21/2022   Chronic pain syndrome 12/20/2022   DDD (degenerative disc disease), 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 Tobie: Pancolonic diverticulosis,  tortuous colon, next colonoscopy 2024   TONSILLECTOMY      Current Medications: Current Facility-Administered Medications  Medication Dose Route Frequency Provider Last Rate Last Admin   acetaminophen  (TYLENOL ) tablet 650 mg  650 mg Oral Q6H PRN Onuoha, Chinwendu V, NP   650 mg at 08/08/24 9060   ARIPiprazole  (ABILIFY ) tablet 30 mg  30 mg Oral Daily Lamond Glantz, MD   30 mg at 08/08/24 0932   aspirin  EC tablet 81 mg  81 mg Oral Daily Bobbitt, Shalon E, NP   81 mg at 08/08/24 0934   cholecalciferol  (VITAMIN D3) 25 MCG (1000 UNIT) tablet 1,000 Units  1,000 Units Oral Weekly Georgeann Brinkman, MD   1,000 Units at 08/08/24 1216   cloZAPine  (CLOZARIL ) tablet 150 mg  150 mg Oral BID Ercel Pepitone, MD   150 mg at 08/08/24 9060   Or   OLANZapine  (ZYPREXA ) injection 10 mg  10 mg Intramuscular BID Kaylenn Civil, MD       dapagliflozin  propanediol (FARXIGA ) tablet 10 mg  10 mg Oral Daily Josette Ade, MD   10 mg at 08/08/24 9060   diclofenac  Sodium (VOLTAREN ) 1 % topical gel 4 g  4 g Topical QID PRN Madaram, Kondal R, MD       famotidine  (PEPCID ) tablet 40 mg  40 mg Oral Daily Shrivastava, Aryendra, MD   40 mg at 08/08/24 0933   fluticasone  (FLONASE ) 50 MCG/ACT nasal spray 1 spray  1 spray Each Nare Daily PRN Madaram, Kondal R, MD       furosemide  (LASIX ) tablet 40 mg  40 mg Oral 2 times per day Josette Ade, MD   40 mg at 08/08/24 0751   gabapentin  (NEURONTIN ) capsule 300 mg  300 mg Oral TID Bobbitt, Shalon E, NP   300 mg at 08/08/24 0933   hydrOXYzine  (ATARAX ) tablet 25 mg  25 mg Oral TID PRN Bobbitt, Shalon E, NP   25 mg at 08/02/24 0155   magnesium  hydroxide (MILK OF MAGNESIA) suspension 30 mL  30 mL Oral Daily PRN Bobbitt, Shalon E, NP   30 mL at 08/06/24 1047   metoprolol  succinate (TOPROL -XL) 24 hr tablet 25 mg  25 mg Oral BID Josette Ade, MD   25 mg at 08/08/24 0935   multivitamin with minerals tablet 1 tablet  1 tablet Oral Daily Bobbitt, Shalon E, NP   1 tablet at 08/08/24  0933   naproxen  (NAPROSYN ) tablet 375 mg  375 mg Oral TID WC Merritt Kibby, MD   375 mg at 08/08/24 1216   OLANZapine  (ZYPREXA ) injection 5 mg  5 mg Intramuscular TID PRN Bobbitt, Shalon E, NP   5 mg at 06/22/24 2259   OLANZapine  zydis (ZYPREXA ) disintegrating tablet 5 mg  5 mg Oral  TID PRN Bobbitt, Shalon E, NP   5 mg at 08/02/24 0155   ondansetron  (ZOFRAN ) tablet 4 mg  4 mg Oral Q8H PRN Bobbitt, Shalon E, NP   4 mg at 08/08/24 0426   Oxcarbazepine  (TRILEPTAL ) tablet 300 mg  300 mg Oral BID Trejon Duford, MD   300 mg at 08/08/24 0941   potassium chloride  SA (KLOR-CON  M) CR tablet 20 mEq  20 mEq Oral BID Bobbitt, Shalon E, NP   20 mEq at 08/08/24 0934   rosuvastatin  (CRESTOR ) tablet 5 mg  5 mg Oral Daily Bobbitt, Shalon E, NP   5 mg at 08/08/24 9065   traZODone  (DESYREL ) tablet 50 mg  50 mg Oral QHS PRN Bobbitt, Shalon E, NP   50 mg at 08/07/24 2158   zolpidem  (AMBIEN ) tablet 10 mg  10 mg Oral QHS Minie Roadcap, MD   10 mg at 08/07/24 2157    Lab Results:  Results for orders placed or performed during the hospital encounter of 06/19/24 (from the past 48 hours)  Glucose, capillary     Status: Abnormal   Collection Time: 08/07/24  7:12 AM  Result Value Ref Range   Glucose-Capillary 117 (H) 70 - 99 mg/dL    Comment: Glucose reference range applies only to samples taken after fasting for at least 8 hours.  Glucose, capillary     Status: Abnormal   Collection Time: 08/08/24  6:13 AM  Result Value Ref Range   Glucose-Capillary 118 (H) 70 - 99 mg/dL    Comment: Glucose reference range applies only to samples taken after fasting for at least 8 hours.        Blood Alcohol level:  Lab Results  Component Value Date   [facility] <15 06/18/2024    Metabolic Disorder Labs: Lab Results  Component Value Date   HGBA1C 5.9 (H) 06/28/2024   MPG 122.63 06/28/2024   No results found for: PROLACTIN Lab Results  Component Value Date   CHOL 156 06/28/2024   TRIG 89 06/28/2024   HDL 70  06/28/2024   CHOLHDL 2.2 06/28/2024   VLDL 18 06/28/2024   LDLCALC 69 06/28/2024    Physical Findings: AIMS:  , ,  ,  ,    CIWA:    COWS:      Psychiatric Specialty Exam:  Presentation  General Appearance:  Casual  Eye Contact: Fleeting  Speech: Normal Rate  Speech Volume: Increased    Mood and Affect  Mood: Euphoric  Affect: stable  Thought Process  Thought Processes: Improving illogical  Orientation:Full (Time, Place and Person)  Thought Content:Illogical; Delusions; Tangential  Hallucinations: Denies  Ideas of Reference:Delusions  Suicidal Thoughts: Denies  Homicidal Thoughts: Denies   Sensorium  Memory: Immediate Fair; Remote Fair  Judgment: Impaired  Insight: Shallow   Executive Functions  Concentration: Poor  Attention Span: Poor  Recall: [employer] of Knowledge: Fair  Language: Fair   Psychomotor Activity  Psychomotor Activity: No data recorded  Musculoskeletal: Strength & Muscle Tone: within normal limits Gait & Station: normal Assets  Assets: Manufacturing Systems Engineer; Desire for Improvement    Physical Exam: Physical Exam Vitals and nursing note reviewed.    ROS Blood pressure (!) 120/59, pulse (!) 103, temperature (!) 97.3 F (36.3 C), resp. rate 16, height 5' 5 (1.651 m), weight 87.3 kg, SpO2 100%. Body mass index is 32.03 kg/m.  Diagnosis: Principal Problem:   Bilateral lower extremity edema Active Problems:   Hypercholesteremia   Schizoaffective disorder, bipolar type (HCC)  Chest pain   Sinus tachycardia   Hypotension   Controlled type 2 diabetes mellitus without complication, without long-term current use of insulin (HCC)   Hyponatremia  Schizoaffective disorder bipolar type  Clinical Decision Making: Patient is currently admitted for worsening psychosis, manic symptoms, tangential grandiosity.  She needs inpatient hospitalization for medication management and close monitoring.  Patient  is unable to make decisions on her own as she is unable to appreciate and understand her mental health history, the current need for treatment.  [facility] has screened her case and for further evaluation for legal guardianship. [facility] screened in her case. Sister and mom are involved in her care.  Treatment Plan Summary:  Patient is not responding at all to Clozaril  in spite of increased dose and also poor response to Geodon  which was discontinued and started on Abilify  which was titrated to 20 mg today.  If patient remains manic/psychotic we will discuss a plan to switch Clozaril  to loxapine as patient has documented allergy to Risperdal/paliperidone. Safety and Monitoring:             -- inoluntary admission to inpatient psychiatric unit for safety, stabilization and treatment             -- Daily contact with patient to assess and evaluate symptoms and progress in treatment             -- Patient's case to be discussed in multi-disciplinary team meeting             -- Observation Level: q15 minute checks             -- Vital signs:  q12 hours             -- Precautions: suicide, elopement, and assault   2. Psychiatric Diagnoses and Treatment:  Discontinue Depakote  as patient is noncompliant Discontinue Geodon  20 mg nightly as poor response Increased Clozaril  150mg  twice daily enforced with zyprexa  10mg  BID- Cymbalta  40 mg daily-discontinue to minimize manic Trilepta 300 mg twice daily  LAI Abilify  Maintena- given 08/04/24 -- The risks/benefits/side-effects/alternatives to this medication were discussed in detail with the patient and time was given for questions. The patient consents to medication trial.                -- Metabolic profile and EKG monitoring obtained while on an atypical antipsychotic (BMI: Lipid Panel: HbgA1c: QTc:)              -- Encouraged patient to participate in unit milieu and in scheduled group therapies                            3. Medical Issues Being Addressed:   Chest  pain Sinus tachycardia HTN (hypertension) Bilateral lower extremity edema Hypercholesteremia  12/30:  Troponin negative x 2. Echocardiogram done with pending results.  Small improvement in lower extremity edema so continuing p.o. Lasix .  Will need BMP every other day.  4. Discharge Planning:   -- Social work and case management to assist with discharge planning and identification of hospital follow-up needs prior to discharge  -- Estimated LOS: 3-4 days  Allyn Foil, MD 08/08/2024, 12:39 PM

## 2024-08-08 NOTE — B[facility] Counselor (Signed)
 CSW contacted pt's sister Michol Emory to discuss guardianship updates and inquire about placement efforts that they have made.   CSW unable to reach left HIPAA compliant VM requesting return call.   Lum Croft, MSW, [place] 08/08/2024 1:03 PM

## 2024-08-08 NOTE — Group Note (Signed)
 LCSW Group Therapy Note    Group Date: 08/08/2024 Start Time: 1300 End Time: 1400   Type of Therapy and Topic: Group Therapy: Body Image  Participation Level:  Did Not Attend  Description of Group:  Patients were educated about body image and asked to think about whether they have a healthy or unhealthy body image. Patients were led in a discussion about factors that contribute to body image, both internal and external. Patients were asked to discuss strengths of the human body outside of appearance, such as being able to fight off diseases and provide stress relief. Lastly, patients were asked to identify one way in which they appreciate their own body outside of appearance.   Therapeutic Goals:   1. Patient will differentiate between a healthy and unhealthy body image. 2. Patient will identify what contributes to body image 3. Patient will discuss the strengths of the human body. 4. Patient will identify a positive attribute of their body outside of physical appearance.  Summary of Patient Progress:  Patient did not attend.   Therapeutic Modalities: Cognitive Behavioral Therapy; Solution-Focused Therapy  Antinette Keough M Mykeal Carrick, LCSWA 08/08/2024  2:02 PM

## 2024-08-08 NOTE — Group Note (Signed)
 Recreation Therapy Group Note   Group Topic:Leisure Education  Group Date: 08/08/2024 Start Time: 1400 End Time: 1440 Facilitators: Celestia Jeoffrey BRAVO, LRT, CTRS Location: Dayroom  Group Description: Bingo. Patients played multiple rounds of bingo. LRT and pts discussed the definition of leisure, things they do in their free time outside of the hospital, and how bingo is also a leisure activity. Pts received a coloring book, word search book, or journal as a prize.     Goal Area(s) Addressed:    Patient will identify a current leisure interest.    Patient will learn the definition of leisure.   Patient will have the opportunity to try a new leisure activity.   Patient will communicate with peers and LRT.    Affect/Mood: Full range   Participation Level: Active   Participation Quality: Minimal Cues   Behavior: Cooperative   Speech/Thought Process: Flight of ideas   Insight: Fair   Judgement: Fair    Modes of Intervention: Competitive Play   Patient Response to Interventions:  Receptive   Education Outcome:  In group clarification offered    Clinical Observations/Individualized Feedback: Valerie [name] was active in their participation of session activities and group discussion. Pt received a stress ball as a prize. Pt interacted well with LRT and peers duration of session.    Plan: Continue to engage patient in RT group sessions 2-3x/week.   Jeoffrey BRAVO Celestia, LRT, CTRS 08/08/2024 2:58 PM

## 2024-08-08 NOTE — B[facility] Counselor (Signed)
 CSW received return call from Temitope Flammer [phone])  CSW inquired about placement efforts made by pt's family. Joy reports they have been looking for places for pt but report they are waiting on the court date on 08/24/24.   CSW inquired if Adams Louder at [facility] had been assisting with placement as she had previously suggest she would.   Joy reports Adams has not at this time.   CSW will send email to Sonja to follow-up on this.   Joy reports she plans to visit pt on Friday 08/11/24 at 1 PM and would like to speak with provider and CSW at this time.   CSW informed provider.    Lum Croft, MSW, [place] 08/08/2024 1:19 PM

## 2024-08-08 NOTE — Plan of Care (Signed)
" °  Problem: Education: Goal: Mental status will improve Outcome: Progressing   Problem: Education: Goal: Emotional status will improve Outcome: Not Progressing   Problem: Activity: Goal: Interest or engagement in activities will improve Outcome: Not Progressing   Problem: Coping: Goal: Ability to verbalize frustrations and anger appropriately will improve Outcome: Not Progressing   "

## 2024-08-08 NOTE — Group Note (Signed)
 Date:  08/08/2024 Time:  11:05 AM  Group Topic/Focus:  Healthy Communication:   The focus of this group is to discuss communication, barriers to communication, as well as healthy ways to communicate with others.    Participation Level:  Minimal  Participation Quality:  Appropriate  Affect:  Appropriate  Cognitive:  Appropriate  Insight: Appropriate  Engagement in Group:  Engaged  Modes of Intervention:  Discussion   Arland Nutting 08/08/2024, 11:05 AM

## 2024-08-08 NOTE — Plan of Care (Signed)
" °  Problem: Education: Goal: Mental status will improve Outcome: Not Progressing   Problem: Activity: Goal: Sleeping patterns will improve Outcome: Progressing   Problem: Health Behavior/Discharge Planning: Goal: Compliance with treatment plan for underlying cause of condition will improve Outcome: Progressing   Problem: Safety: Goal: Periods of time without injury will increase Outcome: Progressing   "

## 2024-08-08 NOTE — Group Note (Signed)
 Date:  08/08/2024 Time:  9:23 PM  Group Topic/Focus:  Wrap-Up Group:   The focus of this group is to help patients review their daily goal of treatment and discuss progress on daily workbooks.    Participation Level:  Active  Participation Quality:  Appropriate  Affect:  Appropriate  Cognitive:  Alert  Insight: Appropriate  Engagement in Group:  Engaged  Modes of Intervention:  Discussion  Additional Comments:    Valerie [name] 08/08/2024, 9:23 PM

## 2024-08-09 DIAGNOSIS — F250 Schizoaffective disorder, bipolar type: Secondary | ICD-10-CM | POA: Diagnosis not present

## 2024-08-09 NOTE — Group Note (Signed)
 Date:  08/09/2024 Time:  9:04 PM  Group Topic/Focus:  Wellness Toolbox:   The focus of this group is to discuss various aspects of wellness, balancing those aspects and exploring ways to increase the ability to experience wellness.  Patients will create a wellness toolbox for use upon discharge.    Participation Level:  Active  Participation Quality:  Appropriate  Affect:  Appropriate  Cognitive:  Appropriate  Insight: Good  Engagement in Group:  Engaged  Modes of Intervention:  Discussion  Additional Comments:    Valerie [name] [place] 08/09/2024, 9:04 PM

## 2024-08-09 NOTE — Progress Notes (Signed)
 [facility] MD Progress Note   12:15 PM Valerie [name]  MRN:  [id number]   Valerie [name] is a 64 year old female presenting voluntary to [facility] requesting a psychiatric evaluation for worsening psychosis. Patient has history of schizophrenia. Patient denied SI, HI, psychosis and alcohol/drug usage. Patient resides at [facility] Patient reports people are coming by my door talking loud, yelling and screaming and coming in my room with keys. Patient reports having multiple medical Afibs and blood clots due to this. Patient reports people are doing things in the dining room. When asked if she called EMS, patient stated I called EMS, I stripped searched over the phone and called the authorities. Patient is admitted to [facility] unit with Q15 min safety monitoring. Multidisciplinary team approach is offered. Medication management; group/milieu therapy is offered.   Sister Lisl Slingerland : [phone]- called on 07/28/24  to update the med changes but went to generic voicemail  Subjective:  Chart reviewed, case discussed in multidisciplinary meeting, patient seen during rounds.   Patient is noted to be resting in bed.  She offers no complaints.  She reports fair appetite and sleep.  She reports that she is looking forward to meet with her family on Friday.  She remains discharge focused and continues to make statements of having outside life.  She denies SI/HI/plan and denies hallucinations per nursing she remains intermittently intrusive but is noted to be visible on the unit, attends some groups.  Past Psychiatric History: see h&P Family History:  Family History  Problem Relation Age of Onset   Colon cancer Neg Hx    Celiac disease Neg Hx    Inflammatory bowel disease Neg Hx    Social History:  Social History   Substance and Sexual Activity  Alcohol Use Never     Social History   Substance and Sexual Activity  Drug Use Never    Social History   Socioeconomic  History   Marital status: Single    Spouse name: Not on file   Number of children: Not on file   Years of education: Not on file   Highest education level: Not on file  Occupational History   Not on file  Tobacco Use   Smoking status: Never   Smokeless tobacco: Never  Substance and Sexual Activity   Alcohol use: Never   Drug use: Never   Sexual activity: Not Currently    Birth control/protection: Pill  Other Topics Concern   Not on file  Social History Narrative   Not on file   Social Drivers of Health   Tobacco Use: Low Risk (06/19/2024)   Patient History    Smoking Tobacco Use: Never    Smokeless Tobacco Use: Never    Passive Exposure: Not on file  Financial Resource Strain: Not on file  Food Insecurity: Unknown (06/19/2024)   Epic    Worried About Programme Researcher, Broadcasting/film/video in the Last Year: Never true    [employer] of Food in the Last Year: Patient declined  Transportation Needs: No Transportation Needs (06/19/2024)   Epic    Lack of Transportation (Medical): No    Lack of Transportation (Non-Medical): No  Physical Activity: Not on file  Stress: Not on file  Social Connections: Not on file  Depression (PHQ2-9): Low Risk (12/21/2022)   Depression (PHQ2-9)    PHQ-2 Score: 0  Alcohol Screen: Low Risk (06/19/2024)   Alcohol Screen    Last Alcohol Screening Score (AUDIT): 0  Housing:  Low Risk (06/19/2024)   Epic    Unable to Pay for Housing in the Last Year: No    Number of Times Moved in the Last Year: 0    Homeless in the Last Year: No  Utilities: Not At Risk (06/19/2024)   Epic    Threatened with loss of utilities: No  Health Literacy: Not on file   Past Medical History:  Past Medical History:  Diagnosis Date   Chronic lower extremity pain (2ry area of Pain) (Right) 12/21/2022   Chronic pain syndrome 12/20/2022   DDD (degenerative disc disease), lumbosacral 12/21/2022   HTN (hypertension)    Hypercholesteremia    Prediabetes    Schizophrenia (HCC)     Past Surgical  History:  Procedure Laterality Date   COLONOSCOPY  2014   Dr. Tobie: Pancolonic diverticulosis, tortuous colon, next colonoscopy 2024   TONSILLECTOMY      Current Medications: Current Facility-Administered Medications  Medication Dose Route Frequency Provider Last Rate Last Admin   acetaminophen  (TYLENOL ) tablet 650 mg  650 mg Oral Q6H PRN Onuoha, Chinwendu V, NP   650 mg at 08/08/24 9060   ARIPiprazole  (ABILIFY ) tablet 30 mg  30 mg Oral Daily Preslea Rhodus, MD   30 mg at 08/09/24 0940   aspirin  EC tablet 81 mg  81 mg Oral Daily Bobbitt, Shalon E, NP   81 mg at 08/09/24 0940   cholecalciferol  (VITAMIN D3) 25 MCG (1000 UNIT) tablet 1,000 Units  1,000 Units Oral Weekly Koralynn Greenspan, MD   1,000 Units at 08/08/24 1216   cloZAPine  (CLOZARIL ) tablet 150 mg  150 mg Oral BID Soma Lizak, MD   150 mg at 08/09/24 9061   Or   OLANZapine  (ZYPREXA ) injection 10 mg  10 mg Intramuscular BID Tamar Lipscomb, MD       dapagliflozin  propanediol (FARXIGA ) tablet 10 mg  10 mg Oral Daily Josette Ade, MD   10 mg at 08/09/24 0940   diclofenac  Sodium (VOLTAREN ) 1 % topical gel 4 g  4 g Topical QID PRN Madaram, Kondal R, MD       famotidine  (PEPCID ) tablet 40 mg  40 mg Oral Daily Shrivastava, Aryendra, MD   40 mg at 08/09/24 9061   fluticasone  (FLONASE ) 50 MCG/ACT nasal spray 1 spray  1 spray Each Nare Daily PRN Madaram, Kondal R, MD       furosemide  (LASIX ) tablet 40 mg  40 mg Oral 2 times per day Josette Ade, MD   40 mg at 08/09/24 0941   gabapentin  (NEURONTIN ) capsule 300 mg  300 mg Oral TID Bobbitt, Shalon E, NP   300 mg at 08/09/24 0940   hydrOXYzine  (ATARAX ) tablet 25 mg  25 mg Oral TID PRN Bobbitt, Shalon E, NP   25 mg at 08/08/24 2139   magnesium  hydroxide (MILK OF MAGNESIA) suspension 30 mL  30 mL Oral Daily PRN Bobbitt, Shalon E, NP   30 mL at 08/06/24 1047   metoprolol  succinate (TOPROL -XL) 24 hr tablet 25 mg  25 mg Oral BID Josette Ade, MD   25 mg at 08/09/24 0940   multivitamin  with minerals tablet 1 tablet  1 tablet Oral Daily Bobbitt, Shalon E, NP   1 tablet at 08/09/24 0938   naproxen  (NAPROSYN ) tablet 375 mg  375 mg Oral TID WC Leary Mcnulty, MD   375 mg at 08/09/24 0939   OLANZapine  (ZYPREXA ) injection 5 mg  5 mg Intramuscular TID PRN Bobbitt, Shalon E, NP   5 mg at 06/22/24  2259   OLANZapine  zydis (ZYPREXA ) disintegrating tablet 5 mg  5 mg Oral TID PRN Bobbitt, Shalon E, NP   5 mg at 08/02/24 0155   ondansetron  (ZOFRAN ) tablet 4 mg  4 mg Oral Q8H PRN Bobbitt, Shalon E, NP   4 mg at 08/08/24 0426   Oxcarbazepine  (TRILEPTAL ) tablet 300 mg  300 mg Oral BID Tyce Delcid, MD   300 mg at 08/09/24 9060   potassium chloride  SA (KLOR-CON  M) CR tablet 20 mEq  20 mEq Oral BID Bobbitt, Shalon E, NP   20 mEq at 08/09/24 9061   rosuvastatin  (CRESTOR ) tablet 5 mg  5 mg Oral Daily Bobbitt, Shalon E, NP   5 mg at 08/09/24 9060   traZODone  (DESYREL ) tablet 50 mg  50 mg Oral QHS PRN Bobbitt, Shalon E, NP   50 mg at 08/07/24 2158   zolpidem  (AMBIEN ) tablet 10 mg  10 mg Oral QHS Chyla Schlender, MD   10 mg at 08/08/24 2139    Lab Results:  Results for orders placed or performed during the hospital encounter of 06/19/24 (from the past 48 hours)  Glucose, capillary     Status: Abnormal   Collection Time: 08/08/24  6:13 AM  Result Value Ref Range   Glucose-Capillary 118 (H) 70 - 99 mg/dL    Comment: Glucose reference range applies only to samples taken after fasting for at least 8 hours.        Blood Alcohol level:  Lab Results  Component Value Date   [facility] <15 06/18/2024    Metabolic Disorder Labs: Lab Results  Component Value Date   HGBA1C 5.9 (H) 06/28/2024   MPG 122.63 06/28/2024   No results found for: PROLACTIN Lab Results  Component Value Date   CHOL 156 06/28/2024   TRIG 89 06/28/2024   HDL 70 06/28/2024   CHOLHDL 2.2 06/28/2024   VLDL 18 06/28/2024   LDLCALC 69 06/28/2024    Physical Findings: AIMS:  , ,  ,  ,    CIWA:    COWS:       Psychiatric Specialty Exam:  Presentation  General Appearance:  Casual  Eye Contact: Fleeting  Speech: Normal Rate  Speech Volume: Increased    Mood and Affect  Mood: Euphoric  Affect: stable  Thought Process  Thought Processes: Improving illogical  Orientation:Full (Time, Place and Person)  Thought Content:Illogical; Delusions; Tangential  Hallucinations: Denies  Ideas of Reference:Delusions  Suicidal Thoughts: Denies  Homicidal Thoughts: Denies   Sensorium  Memory: Immediate Fair; Remote Fair  Judgment: Impaired  Insight: Shallow   Executive Functions  Concentration: Poor  Attention Span: Poor  Recall: [employer] of Knowledge: Fair  Language: Fair   Psychomotor Activity  Psychomotor Activity: No data recorded  Musculoskeletal: Strength & Muscle Tone: within normal limits Gait & Station: normal Assets  Assets: Manufacturing Systems Engineer; Desire for Improvement    Physical Exam: Physical Exam Vitals and nursing note reviewed.    ROS Blood pressure (!) 113/95, pulse (!) 103, temperature 97.8 F (36.6 C), resp. rate 17, height 5' 5 (1.651 m), weight 87.3 kg, SpO2 100%. Body mass index is 32.03 kg/m.  Diagnosis: Principal Problem:   Bilateral lower extremity edema Active Problems:   Hypercholesteremia   Schizoaffective disorder, bipolar type (HCC)   Chest pain   Sinus tachycardia   Hypotension   Controlled type 2 diabetes mellitus without complication, without long-term current use of insulin (HCC)   Hyponatremia  Schizoaffective disorder bipolar type  Clinical Decision  Making: Patient is currently admitted for worsening psychosis, manic symptoms, tangential grandiosity.  She needs inpatient hospitalization for medication management and close monitoring.  Patient is unable to make decisions on her own as she is unable to appreciate and understand her mental health history, the current need for treatment.  [facility] has  screened her case and for further evaluation for legal guardianship. [facility] screened in her case. Sister and mom are involved in her care.  Treatment Plan Summary:  Patient is not responding at all to Clozaril  in spite of increased dose and also poor response to Geodon  which was discontinued and started on Abilify  which was titrated to 20 mg today.  If patient remains manic/psychotic we will discuss a plan to switch Clozaril  to loxapine as patient has documented allergy to Risperdal/paliperidone. Safety and Monitoring:             -- inoluntary admission to inpatient psychiatric unit for safety, stabilization and treatment             -- Daily contact with patient to assess and evaluate symptoms and progress in treatment             -- Patient's case to be discussed in multi-disciplinary team meeting             -- Observation Level: q15 minute checks             -- Vital signs:  q12 hours             -- Precautions: suicide, elopement, and assault   2. Psychiatric Diagnoses and Treatment:  Discontinue Depakote  as patient is noncompliant Discontinue Geodon  20 mg nightly as poor response Clozaril  150mg  twice daily  Cymbalta  40 mg daily-discontinue to minimize manic Trilepta 300 mg twice daily  LAI Abilify  Maintena- given 08/04/24 -- The risks/benefits/side-effects/alternatives to this medication were discussed in detail with the patient and time was given for questions. The patient consents to medication trial.                -- Metabolic profile and EKG monitoring obtained while on an atypical antipsychotic (BMI: Lipid Panel: HbgA1c: QTc:)              -- Encouraged patient to participate in unit milieu and in scheduled group therapies                            3. Medical Issues Being Addressed:   Chest pain Sinus tachycardia HTN (hypertension) Bilateral lower extremity edema Hypercholesteremia  12/30:  Troponin negative x 2. Echocardiogram done with pending results.  Small improvement  in lower extremity edema so continuing p.o. Lasix .  Will need BMP every other day.  4. Discharge Planning:   -- Social work and case management to assist with discharge planning and identification of hospital follow-up needs prior to discharge  -- Estimated LOS: 3-4 days  Daven Montz, MD 08/09/2024, 12:15 PM

## 2024-08-09 NOTE — Plan of Care (Signed)
" °  Problem: Education: Goal: Knowledge of [facility] Education information/materials will improve Outcome: Progressing Goal: Emotional status will improve Outcome: Progressing Goal: Mental status will improve Outcome: Progressing Goal: Verbalization of understanding the information provided will improve Outcome: Progressing   Problem: Activity: Goal: Interest or engagement in activities will improve Outcome: Progressing Goal: Sleeping patterns will improve Outcome: Progressing   Problem: Coping: Goal: Ability to verbalize frustrations and anger appropriately will improve Outcome: Progressing Goal: Ability to demonstrate self-control will improve Outcome: Progressing   Problem: Health Behavior/Discharge Planning: Goal: Identification of resources available to assist in meeting health care needs will improve Outcome: Progressing Goal: Compliance with treatment plan for underlying cause of condition will improve Outcome: Progressing   Problem: Physical Regulation: Goal: Ability to maintain clinical measurements within normal limits will improve Outcome: Progressing   Problem: Safety: Goal: Periods of time without injury will increase Outcome: Progressing   Problem: Education: Goal: Ability to state activities that reduce stress will improve Outcome: Progressing   Problem: Self-Concept: Goal: Ability to identify factors that promote anxiety will improve Outcome: Progressing Goal: Level of anxiety will decrease Outcome: Progressing Goal: Ability to modify response to factors that promote anxiety will improve Outcome: Progressing   Problem: Activity: Goal: Will verbalize the importance of balancing activity with adequate rest periods Outcome: Progressing   "

## 2024-08-09 NOTE — Progress Notes (Signed)
" °   08/09/24 1000  Psych Admission Type (Psych Patients Only)  Admission Status Involuntary  Psychosocial Assessment  Patient Complaints Anxiety;Worrying;Restlessness  Eye Contact Fair  Facial Expression Animated;Worried  Affect Labile  Consulting Civil Engineer Activity Slow  Appearance/Hygiene Unremarkable  Behavior Characteristics Anxious;Intrusive;Restless  Mood Preoccupied;Anxious  Thought Process  Coherency Disorganized  Content Preoccupation  Delusions Paranoid;Religious  Perception WDL  Hallucination None reported or observed  Judgment Impaired  Confusion Mild  Danger to Self  Current suicidal ideation? Denies  Danger to Others  Danger to Others None reported or observed    "

## 2024-08-09 NOTE — Group Note (Signed)
 Date:  08/09/2024 Time:  11:21 AM  Group Topic/Focus:  Recovery Goals:   The focus of this group is to identify appropriate goals for recovery and establish a plan to achieve them.    Participation Level:  Did Not Attend   Valerie [name] 08/09/2024, 11:21 AM

## 2024-08-09 NOTE — Progress Notes (Signed)
" °   08/08/24 2000  Psych Admission Type (Psych Patients Only)  Admission Status Involuntary  Psychosocial Assessment  Patient Complaints Anxiety  Eye Contact Fair  Facial Expression Animated  Affect Labile  Speech Tangential  Interaction Intrusive  Motor Activity Slow  Appearance/Hygiene Unremarkable  Behavior Characteristics Intrusive  Mood Labile  Thought Process  Coherency Disorganized  Content Preoccupation  Delusions Paranoid;Grandeur  Perception UTA  Hallucination None reported or observed  Judgment Impaired  Confusion Mild  Danger to Self  Current suicidal ideation? Denies  Agreement Not to Harm Self Yes  Description of Agreement verbal  Danger to Others  Danger to Others None reported or observed   Mood/Behavior:  Pleasant and cooperative. Intrusive. Pt asking clinical research associate if clinical research associate had a cell phone. Unit protocol for phones discussed with pt. Pt became loud stating no, I can not use those phone it is not working. Pt given cordless phone and dialed number and pt was able to make phone call successfully.   Psych assessment: Denies SI/HI and AVH.     Interaction / Group attendance:  Present in the milieu. Interacting with peers and staff.  Attended group.   Medication/ PRNs: Compliant with scheduled medications. Required PRNs Atarax  for anxiety and noted effective.   Pain: Denies   15 min checks in place for safety. "

## 2024-08-09 NOTE — Plan of Care (Signed)
   Problem: Activity: Goal: Interest or engagement in activities will improve Outcome: Progressing Goal: Sleeping patterns will improve Outcome: Progressing

## 2024-08-10 DIAGNOSIS — F250 Schizoaffective disorder, bipolar type: Secondary | ICD-10-CM | POA: Diagnosis not present

## 2024-08-10 LAB — CBC WITH DIFFERENTIAL/PLATELET
Abs Immature Granulocytes: 0.02 10*3/uL (ref 0.00–0.07)
Basophils Absolute: 0.1 10*3/uL (ref 0.0–0.1)
Basophils Relative: 1 %
Eosinophils Absolute: 0.3 10*3/uL (ref 0.0–0.5)
Eosinophils Relative: 4 %
HCT: 37.4 % (ref 36.0–46.0)
Hemoglobin: 11.7 g/dL — ABNORMAL LOW (ref 12.0–15.0)
Immature Granulocytes: 0 %
Lymphocytes Relative: 29 %
Lymphs Abs: 2.2 10*3/uL (ref 0.7–4.0)
MCH: 27 pg (ref 26.0–34.0)
MCHC: 31.3 g/dL (ref 30.0–36.0)
MCV: 86.4 fL (ref 80.0–100.0)
Monocytes Absolute: 0.7 10*3/uL (ref 0.1–1.0)
Monocytes Relative: 9 %
Neutro Abs: 4.4 10*3/uL (ref 1.7–7.7)
Neutrophils Relative %: 57 %
Platelets: 317 10*3/uL (ref 150–400)
RBC: 4.33 MIL/uL (ref 3.87–5.11)
RDW: 13.9 % (ref 11.5–15.5)
WBC: 7.7 10*3/uL (ref 4.0–10.5)
nRBC: 0 % (ref 0.0–0.2)

## 2024-08-10 NOTE — Plan of Care (Signed)
   Problem: Education: Goal: Knowledge of [place] [facility] Education information/materials will improve Outcome: Progressing Goal: Emotional status will improve Outcome: Progressing Goal: Mental status will improve Outcome: Progressing Goal: Verbalization of understanding the information provided will improve Outcome: Progressing

## 2024-08-10 NOTE — Progress Notes (Signed)
 [facility] MD Progress Note   3:46 PM Valerie [name]  MRN:  [id number]   Valerie [name] is a 64 year old female presenting voluntary to [facility] requesting a psychiatric evaluation for worsening psychosis. Patient has history of schizophrenia. Patient denied SI, HI, psychosis and alcohol/drug usage. Patient resides at [facility]. Patient reports people are coming by my door talking loud, yelling and screaming and coming in my room with keys. Patient reports having multiple medical Afibs and blood clots due to this. Patient reports people are doing things in the dining room. When asked if she called EMS, patient stated I called EMS, I stripped searched over the phone and called the authorities. Patient is admitted to [facility] unit with Q15 min safety monitoring. Multidisciplinary team approach is offered. Medication management; group/milieu therapy is offered.   Sister Jaydyn Menon : [phone]- called on 07/28/24  to update the med changes but went to generic voicemail  Subjective:  Chart reviewed, case discussed in multidisciplinary meeting, patient seen during rounds.   Patient is noted to be resting in bed.  She offers no complaints.  She request for Cogentin but is unable to give any specific reason for that.  She reports being on Clazuril she needs Cogentin.  She denies SI/HI/plan.  She denies auditory/visual hallucinations.  Per nursing patient is taking her medications.  She has intermittent episodes of screaming and received as needed Zyprexa   Past Psychiatric History: see h&P Family History:  Family History  Problem Relation Age of Onset   Colon cancer Neg Hx    Celiac disease Neg Hx    Inflammatory bowel disease Neg Hx    Social History:  Social History   Substance and Sexual Activity  Alcohol Use Never     Social History   Substance and Sexual Activity  Drug Use Never    Social History   Socioeconomic History   Marital status: Single    Spouse  name: Not on file   Number of children: Not on file   Years of education: Not on file   Highest education level: Not on file  Occupational History   Not on file  Tobacco Use   Smoking status: Never   Smokeless tobacco: Never  Substance and Sexual Activity   Alcohol use: Never   Drug use: Never   Sexual activity: Not Currently    Birth control/protection: Pill  Other Topics Concern   Not on file  Social History Narrative   Not on file   Social Drivers of Health   Tobacco Use: Low Risk (06/19/2024)   Patient History    Smoking Tobacco Use: Never    Smokeless Tobacco Use: Never    Passive Exposure: Not on file  Financial Resource Strain: Not on file  Food Insecurity: Unknown (06/19/2024)   Epic    Worried About Programme Researcher, Broadcasting/film/video in the Last Year: Never true    [employer] of Food in the Last Year: Patient declined  Transportation Needs: No Transportation Needs (06/19/2024)   Epic    Lack of Transportation (Medical): No    Lack of Transportation (Non-Medical): No  Physical Activity: Not on file  Stress: Not on file  Social Connections: Not on file  Depression (PHQ2-9): Low Risk (12/21/2022)   Depression (PHQ2-9)    PHQ-2 Score: 0  Alcohol Screen: Low Risk (06/19/2024)   Alcohol Screen    Last Alcohol Screening Score (AUDIT): 0  Housing: Low Risk (06/19/2024)   Epic  Unable to Pay for Housing in the Last Year: No    Number of Times Moved in the Last Year: 0    Homeless in the Last Year: No  Utilities: Not At Risk (06/19/2024)   Epic    Threatened with loss of utilities: No  Health Literacy: Not on file   Past Medical History:  Past Medical History:  Diagnosis Date   Chronic lower extremity pain (2ry area of Pain) (Right) 12/21/2022   Chronic pain syndrome 12/20/2022   DDD (degenerative disc disease), lumbosacral 12/21/2022   HTN (hypertension)    Hypercholesteremia    Prediabetes    Schizophrenia (HCC)     Past Surgical History:  Procedure Laterality Date    COLONOSCOPY  2014   Dr. Tobie: Pancolonic diverticulosis, tortuous colon, next colonoscopy 2024   TONSILLECTOMY      Current Medications: Current Facility-Administered Medications  Medication Dose Route Frequency Provider Last Rate Last Admin   acetaminophen  (TYLENOL ) tablet 650 mg  650 mg Oral Q6H PRN Onuoha, Chinwendu V, NP   650 mg at 08/08/24 0939   ARIPiprazole  (ABILIFY ) tablet 30 mg  30 mg Oral Daily Kenyetta Fife, MD   30 mg at 08/10/24 0831   aspirin  EC tablet 81 mg  81 mg Oral Daily Bobbitt, Shalon E, NP   81 mg at 08/10/24 0834   cholecalciferol  (VITAMIN D3) 25 MCG (1000 UNIT) tablet 1,000 Units  1,000 Units Oral Weekly Fredrick Dray, MD   1,000 Units at 08/08/24 1216   cloZAPine  (CLOZARIL ) tablet 150 mg  150 mg Oral BID Theoren Palka, MD   150 mg at 08/10/24 0830   Or   OLANZapine  (ZYPREXA ) injection 10 mg  10 mg Intramuscular BID Marieme Mcmackin, MD       dapagliflozin  propanediol (FARXIGA ) tablet 10 mg  10 mg Oral Daily Josette Ade, MD   10 mg at 08/10/24 0831   diclofenac  Sodium (VOLTAREN ) 1 % topical gel 4 g  4 g Topical QID PRN Madaram, Kondal R, MD       famotidine  (PEPCID ) tablet 40 mg  40 mg Oral Daily Shrivastava, Aryendra, MD   40 mg at 08/10/24 9167   fluticasone  (FLONASE ) 50 MCG/ACT nasal spray 1 spray  1 spray Each Nare Daily PRN Madaram, Kondal R, MD       furosemide  (LASIX ) tablet 40 mg  40 mg Oral 2 times per day Josette Ade, MD   40 mg at 08/10/24 1545   gabapentin  (NEURONTIN ) capsule 300 mg  300 mg Oral TID Bobbitt, Shalon E, NP   300 mg at 08/10/24 1545   hydrOXYzine  (ATARAX ) tablet 25 mg  25 mg Oral TID PRN Bobbitt, Shalon E, NP   25 mg at 08/09/24 2114   magnesium  hydroxide (MILK OF MAGNESIA) suspension 30 mL  30 mL Oral Daily PRN Bobbitt, Shalon E, NP   30 mL at 08/06/24 1047   metoprolol  succinate (TOPROL -XL) 24 hr tablet 25 mg  25 mg Oral BID Josette Ade, MD   25 mg at 08/10/24 0831   multivitamin with minerals tablet 1 tablet  1 tablet  Oral Daily Bobbitt, Shalon E, NP   1 tablet at 08/10/24 0831   naproxen  (NAPROSYN ) tablet 375 mg  375 mg Oral TID WC Marguerette Sheller, MD   375 mg at 08/10/24 1239   OLANZapine  (ZYPREXA ) injection 5 mg  5 mg Intramuscular TID PRN Bobbitt, Shalon E, NP   5 mg at 06/22/24 2259   OLANZapine  zydis (ZYPREXA ) disintegrating tablet 5  mg  5 mg Oral TID PRN Bobbitt, Shalon E, NP   5 mg at 08/10/24 9167   ondansetron  (ZOFRAN ) tablet 4 mg  4 mg Oral Q8H PRN Bobbitt, Shalon E, NP   4 mg at 08/08/24 0426   Oxcarbazepine  (TRILEPTAL ) tablet 300 mg  300 mg Oral BID Eutimio Gharibian, MD   300 mg at 08/10/24 0830   potassium chloride  SA (KLOR-CON  M) CR tablet 20 mEq  20 mEq Oral BID Bobbitt, Shalon E, NP   20 mEq at 08/10/24 9167   rosuvastatin  (CRESTOR ) tablet 5 mg  5 mg Oral Daily Bobbitt, Shalon E, NP   5 mg at 08/10/24 9167   traZODone  (DESYREL ) tablet 50 mg  50 mg Oral QHS PRN Bobbitt, Shalon E, NP   50 mg at 08/07/24 2158   zolpidem  (AMBIEN ) tablet 10 mg  10 mg Oral QHS Donnelly Mellow, MD   10 mg at 08/09/24 2114    Lab Results:  Results for orders placed or performed during the hospital encounter of 06/19/24 (from the past 48 hours)  CBC with Differential/Platelet     Status: Abnormal   Collection Time: 08/10/24 10:15 AM  Result Value Ref Range   WBC 7.7 4.0 - 10.5 K/uL   RBC 4.33 3.87 - 5.11 MIL/uL   Hemoglobin 11.7 (L) 12.0 - 15.0 g/dL   HCT 62.5 63.9 - 53.9 %   MCV 86.4 80.0 - 100.0 fL   MCH 27.0 26.0 - 34.0 pg   MCHC 31.3 30.0 - 36.0 g/dL   RDW 86.0 88.4 - 84.4 %   Platelets 317 150 - 400 K/uL   nRBC 0.0 0.0 - 0.2 %   Neutrophils Relative % 57 %   Neutro Abs 4.4 1.7 - 7.7 K/uL   Lymphocytes Relative 29 %   Lymphs Abs 2.2 0.7 - 4.0 K/uL   Monocytes Relative 9 %   Monocytes Absolute 0.7 0.1 - 1.0 K/uL   Eosinophils Relative 4 %   Eosinophils Absolute 0.3 0.0 - 0.5 K/uL   Basophils Relative 1 %   Basophils Absolute 0.1 0.0 - 0.1 K/uL   Immature Granulocytes 0 %   Abs Immature Granulocytes  0.02 0.00 - 0.07 K/uL    Comment: Performed at [facility], [address] [place], [place] [address]        Blood Alcohol level:  Lab Results  Component Value Date   [facility] <15 06/18/2024    Metabolic Disorder Labs: Lab Results  Component Value Date   HGBA1C 5.9 (H) 06/28/2024   MPG 122.63 06/28/2024   No results found for: PROLACTIN Lab Results  Component Value Date   CHOL 156 06/28/2024   TRIG 89 06/28/2024   HDL 70 06/28/2024   CHOLHDL 2.2 06/28/2024   VLDL 18 06/28/2024   LDLCALC 69 06/28/2024    Physical Findings: AIMS:  , ,  ,  ,    CIWA:    COWS:      Psychiatric Specialty Exam:  Presentation  General Appearance:  Casual  Eye Contact: Fleeting  Speech: Normal Rate  Speech Volume: Increased    Mood and Affect  Mood: Euphoric  Affect: stable  Thought Process  Thought Processes: Improving illogical  Orientation:Full (Time, Place and Person)  Thought Content:Illogical; Delusions; Tangential  Hallucinations: Denies  Ideas of Reference:Delusions  Suicidal Thoughts: Denies  Homicidal Thoughts: Denies   Sensorium  Memory: Immediate Fair; Remote Fair  Judgment: Impaired  Insight: Shallow   Executive Functions  Concentration: Poor  Attention Span: Poor  Recall: [employer] of Knowledge: Fair  Language: Fair   Psychomotor Activity  Psychomotor Activity: No data recorded  Musculoskeletal: Strength & Muscle Tone: within normal limits Gait & Station: normal Assets  Assets: Manufacturing Systems Engineer; Desire for Improvement    Physical Exam: Physical Exam Vitals and nursing note reviewed.    ROS Blood pressure (!) 118/96, pulse 92, temperature (!) 97 F (36.1 C), resp. rate 12, height 5' 5 (1.651 m), weight 87.3 kg, SpO2 100%. Body mass index is 32.03 kg/m.  Diagnosis: Principal Problem:   Bilateral lower extremity edema Active Problems:   Hypercholesteremia   Schizoaffective disorder,  bipolar type (HCC)   Chest pain   Sinus tachycardia   Hypotension   Controlled type 2 diabetes mellitus without complication, without long-term current use of insulin (HCC)   Hyponatremia  Schizoaffective disorder bipolar type  Clinical Decision Making: Patient is currently admitted for worsening psychosis, manic symptoms, tangential grandiosity.  She needs inpatient hospitalization for medication management and close monitoring.  Patient is unable to make decisions on her own as she is unable to appreciate and understand her mental health history, the current need for treatment.  [facility] has screened her case and for further evaluation for legal guardianship. [facility] screened in her case. Sister and mom are involved in her care.  Treatment Plan Summary:  Patient is not responding at all to Clozaril  in spite of increased dose and also poor response to Geodon  which was discontinued and started on Abilify  which was titrated to 20 mg today.  If patient remains manic/psychotic we will discuss a plan to switch Clozaril  to loxapine as patient has documented allergy to Risperdal/paliperidone. Safety and Monitoring:             -- inoluntary admission to inpatient psychiatric unit for safety, stabilization and treatment             -- Daily contact with patient to assess and evaluate symptoms and progress in treatment             -- Patient's case to be discussed in multi-disciplinary team meeting             -- Observation Level: q15 minute checks             -- Vital signs:  q12 hours             -- Precautions: suicide, elopement, and assault   2. Psychiatric Diagnoses and Treatment:  Discontinue Depakote  as patient is noncompliant Discontinue Geodon  20 mg nightly as poor response Clozaril  150mg  twice daily  Cymbalta  40 mg daily-discontinue to minimize manic Trilepta 300 mg twice daily  LAI Abilify  Maintena- given 08/04/24 -- The risks/benefits/side-effects/alternatives to this medication were discussed  in detail with the patient and time was given for questions. The patient consents to medication trial.                -- Metabolic profile and EKG monitoring obtained while on an atypical antipsychotic (BMI: Lipid Panel: HbgA1c: QTc:)              -- Encouraged patient to participate in unit milieu and in scheduled group therapies                            3. Medical Issues Being Addressed:   Chest pain Sinus tachycardia HTN (hypertension) Bilateral lower extremity edema Hypercholesteremia  12/30:  Troponin negative x 2. Echocardiogram  done with pending results.  Small improvement in lower extremity edema so continuing p.o. Lasix .  Will need BMP every other day.  4. Discharge Planning:   -- Social work and case management to assist with discharge planning and identification of hospital follow-up needs prior to discharge  -- Estimated LOS: 3-4 days  Dallen Bunte, MD 08/10/2024, 3:46 PM

## 2024-08-10 NOTE — Progress Notes (Signed)
" °   08/09/24 2000  Psych Admission Type (Psych Patients Only)  Admission Status Involuntary  Psychosocial Assessment  Patient Complaints Agitation;Anxiety;Crying spells;Irritability;Worrying  Eye Contact Fair  Facial Expression Animated;Worried  Affect Labile  Child Psychotherapist Intrusive  Motor Activity Slow  Appearance/Hygiene Unremarkable  Behavior Characteristics Anxious;Agitated;Irritable  Mood Anxious;Preoccupied  Thought Process  Coherency Disorganized  Content Preoccupation  Delusions Paranoid;Grandeur  Perception UTA  Hallucination None reported or observed  Judgment Impaired  Confusion Mild  Danger to Self  Current suicidal ideation? Denies  Agreement Not to Harm Self Yes  Description of Agreement verbal  Danger to Others  Danger to Others None reported or observed   Mood/Behavior:  Irritable, agitated and anxious. Pt intrusive having crying spells yelling out loud I am not famous do not believe them.    Psych assessment: Denies SI/HI and AVH.     Interaction / Group attendance:  Present in the milieu. Minimal interaction with peers and staff.  Attended group.   Medication/ PRNs: Compliant with scheduled medications. Required PRNs Atarax  for anxiety and noted effective.   Pain: Denies   15 min checks in place for safety "

## 2024-08-10 NOTE — Group Note (Signed)
 Date:  08/10/2024 Time:  9:47 AM  Group Topic/Focus:  Movement Therapy. Morning Stretch with Valerie [name].    Participation Level:  None  Participation Quality:  none  Affect:  none  Cognitive:  none  Insight: None  Engagement in Group:  None  Modes of Intervention:  Activity  Additional Comments:  came to group sat for a Minute then left  [name] 08/10/2024, 9:47 AM

## 2024-08-10 NOTE — Group Note (Signed)
 Recreation Therapy Group Note   Group Topic:Stress Management  Group Date: 08/10/2024 Start Time: 1410 End Time: 1440 Facilitators: Celestia Jeoffrey BRAVO, LRT, CTRS Location: Dayroom  Group Description: PMR (Progressive Muscle Relaxation). LRT educates patients on what PMR is and the benefits that come from it. Patients are asked to sit with their feet flat on the floor while sitting up and all the way back in their chair, if possible. LRT and pts follow a prompt through a speaker that requires you to tense and release different muscles in their body and focus on their breathing. During session, lights are off and soft music is being played. Pts are given a stress ball to use if needed.   Goal Area(s) Addressed:  Patients will be able to describe progressive muscle relaxation.  Patient will practice using relaxation technique. Patient will identify a new coping skill.  Patient will follow multistep directions to reduce anxiety and stress.   Affect/Mood: N/A   Participation Level: Did not attend    Clinical Observations/Individualized Feedback: Patient did not attend.  Plan: Continue to engage patient in RT group sessions 2-3x/week.   Jeoffrey BRAVO Celestia, LRT, CTRS 08/10/2024 4:27 PM

## 2024-08-10 NOTE — Group Note (Signed)
 Date:  08/10/2024 Time:  8:40 PM  Group Topic/Focus:  Wrap-Up Group:   The focus of this group is to help patients review their daily goal of treatment and discuss progress on daily workbooks.    Participation Level:  Active  Participation Quality:  Appropriate  Affect:  Appropriate  Cognitive:  Appropriate  Insight: Appropriate  Engagement in Group:  Engaged  Modes of Intervention:  Discussion  Additional Comments:    Valerie [name] 08/10/2024, 8:40 PM

## 2024-08-10 NOTE — [facility] Group Notes (Signed)
 Spirituality Group   Description: Participant directed exploration of values, beliefs and meaning.  Following a brief framework of chaplains role and ground rules of group behavior, participants are invited to share concerns or questions that engage spiritual life. Emphasis placed on common themes and shared experiences and ways to make meaning and clarify living into ones values.   *Use of conversation activity questions to foster deeper connection   Theory/Process/Goal: Utilize the theoretical framework of group therapy established by Celena Kite, Relational Cultural Theory and Rogerian approaches to facilitate relational empathy and use of the here and now to foster reflection, self-awareness, and sharing.   Observations: Valerie [name] was present for second half of group. She was an active participant in the group discussion.  [name] L. Delores HERO.Div

## 2024-08-10 NOTE — Progress Notes (Signed)
" °   08/10/24 1200  Psych Admission Type (Psych Patients Only)  Admission Status Involuntary  Psychosocial Assessment  Patient Complaints Anxiety;Agitation;Restlessness  Eye Contact Fair  Facial Expression Animated;Worried  Affect Labile  Consulting Civil Engineer Activity Slow  Appearance/Hygiene Unremarkable  Behavior Characteristics Anxious  Mood Anxious;Preoccupied  Thought Process  Coherency Disorganized  Content Preoccupation  Delusions Paranoid;Religious  Perception WDL  Hallucination None reported or observed  Judgment Impaired  Confusion Mild  Danger to Self  Current suicidal ideation? Denies  Danger to Others  Danger to Others None reported or observed    "

## 2024-08-11 DIAGNOSIS — F250 Schizoaffective disorder, bipolar type: Secondary | ICD-10-CM | POA: Diagnosis not present

## 2024-08-11 LAB — GLUCOSE, CAPILLARY: Glucose-Capillary: 108 mg/dL — ABNORMAL HIGH (ref 70–99)

## 2024-08-11 MED ORDER — ALUM & MAG HYDROXIDE-SIMETH 200-200-20 MG/5ML PO SUSP
30.0000 mL | Freq: Three times a day (TID) | ORAL | Status: AC | PRN
  Administered 2024-08-11 – 2024-08-16 (×4): 30 mL via ORAL
  Filled 2024-08-11 (×5): qty 30

## 2024-08-11 MED ORDER — BENZTROPINE MESYLATE 1 MG PO TABS: 0.5000 mg | ORAL_TABLET | Freq: Every day | ORAL | Status: AC | PRN

## 2024-08-11 NOTE — Plan of Care (Signed)
   Problem: Education: Goal: Emotional status will improve Outcome: Progressing Goal: Mental status will improve Outcome: Progressing   Problem: Activity: Goal: Interest or engagement in activities will improve Outcome: Progressing

## 2024-08-11 NOTE — BH IP Treatment Plan (Signed)
 Interdisciplinary Treatment and Diagnostic Plan Update  08/11/2024 Time of Session: 9:00 AM  Valerie [name] MRN: [id number]  Principal Diagnosis: Bilateral lower extremity edema  Secondary Diagnoses: Principal Problem:   Bilateral lower extremity edema Active Problems:   Hypercholesteremia   Schizoaffective disorder, bipolar type (HCC)   Chest pain   Sinus tachycardia   Hypotension   Controlled type 2 diabetes mellitus without complication, without long-term current use of insulin (HCC)   Hyponatremia   Current Medications:  Current Facility-Administered Medications  Medication Dose Route Frequency Provider Last Rate Last Admin   acetaminophen  (TYLENOL ) tablet 650 mg  650 mg Oral Q6H PRN Onuoha, Chinwendu V, NP   650 mg at 08/08/24 0939   ARIPiprazole  (ABILIFY ) tablet 30 mg  30 mg Oral Daily Jadapalle, Sree, MD   30 mg at 08/11/24 0947   aspirin  EC tablet 81 mg  81 mg Oral Daily Bobbitt, Shalon E, NP   81 mg at 08/11/24 0948   cholecalciferol  (VITAMIN D3) 25 MCG (1000 UNIT) tablet 1,000 Units  1,000 Units Oral Weekly Jadapalle, Sree, MD   1,000 Units at 08/08/24 1216   cloZAPine  (CLOZARIL ) tablet 150 mg  150 mg Oral BID Jadapalle, Sree, MD   150 mg at 08/11/24 9052   Or   OLANZapine  (ZYPREXA ) injection 10 mg  10 mg Intramuscular BID Jadapalle, Sree, MD       dapagliflozin  propanediol (FARXIGA ) tablet 10 mg  10 mg Oral Daily Josette Ade, MD   10 mg at 08/11/24 9052   diclofenac  Sodium (VOLTAREN ) 1 % topical gel 4 g  4 g Topical QID PRN Madaram, Kondal R, MD       famotidine  (PEPCID ) tablet 40 mg  40 mg Oral Daily Shrivastava, Aryendra, MD   40 mg at 08/11/24 0946   fluticasone  (FLONASE ) 50 MCG/ACT nasal spray 1 spray  1 spray Each Nare Daily PRN Madaram, Kondal R, MD       furosemide  (LASIX ) tablet 40 mg  40 mg Oral 2 times per day Josette Ade, MD   40 mg at 08/11/24 0947   gabapentin  (NEURONTIN ) capsule 300 mg  300 mg Oral TID Bobbitt, Shalon E, NP   300 mg at 08/11/24  0948   hydrOXYzine  (ATARAX ) tablet 25 mg  25 mg Oral TID PRN Bobbitt, Shalon E, NP   25 mg at 08/10/24 2047   magnesium  hydroxide (MILK OF MAGNESIA) suspension 30 mL  30 mL Oral Daily PRN Bobbitt, Shalon E, NP   30 mL at 08/06/24 1047   metoprolol  succinate (TOPROL -XL) 24 hr tablet 25 mg  25 mg Oral BID Josette Ade, MD   25 mg at 08/11/24 0948   multivitamin with minerals tablet 1 tablet  1 tablet Oral Daily Bobbitt, Shalon E, NP   1 tablet at 08/11/24 0948   naproxen  (NAPROSYN ) tablet 375 mg  375 mg Oral TID WC Jadapalle, Sree, MD   375 mg at 08/11/24 1152   OLANZapine  (ZYPREXA ) injection 5 mg  5 mg Intramuscular TID PRN Bobbitt, Shalon E, NP   5 mg at 06/22/24 2259   OLANZapine  zydis (ZYPREXA ) disintegrating tablet 5 mg  5 mg Oral TID PRN Bobbitt, Shalon E, NP   5 mg at 08/11/24 0948   ondansetron  (ZOFRAN ) tablet 4 mg  4 mg Oral Q8H PRN Bobbitt, Shalon E, NP   4 mg at 08/10/24 2126   Oxcarbazepine  (TRILEPTAL ) tablet 300 mg  300 mg Oral BID Jadapalle, Sree, MD   300 mg at 08/11/24 [phone]  potassium chloride  SA (KLOR-CON  M) CR tablet 20 mEq  20 mEq Oral BID Bobbitt, Shalon E, NP   20 mEq at 08/11/24 0946   rosuvastatin  (CRESTOR ) tablet 5 mg  5 mg Oral Daily Bobbitt, Shalon E, NP   5 mg at 08/11/24 0946   traZODone  (DESYREL ) tablet 50 mg  50 mg Oral QHS PRN Bobbitt, Shalon E, NP   50 mg at 08/07/24 2158   zolpidem  (AMBIEN ) tablet 10 mg  10 mg Oral QHS Jadapalle, Sree, MD   10 mg at 08/10/24 2047   PTA Medications: Medications Prior to Admission  Medication Sig Dispense Refill Last Dose/Taking   acetaminophen  (TYLENOL ) 650 MG CR tablet Take 650 mg by mouth every 8 (eight) hours as needed for pain.      benztropine  (COGENTIN ) 0.5 MG tablet Take by mouth.      bisacodyl  5 MG EC tablet As directed for procedure 8 tablet 0    Cholecalciferol  (VITAMIN D ) 125 MCG (5000 UT) CAPS Take 1,000 Units by mouth once a week.      cloZAPine  (CLOZARIL ) 100 MG tablet Take 200 mg by mouth 2 (two) times daily.       DULoxetine  (CYMBALTA ) 20 MG capsule Take 20 mg by mouth daily.      fluticasone  (FLONASE ) 50 MCG/ACT nasal spray Place 1 spray into both nostrils daily.      gabapentin  (NEURONTIN ) 300 MG capsule Take 300 mg by mouth 3 (three) times daily.      haloperidol  (HALDOL ) 0.5 MG tablet Take 0.5 mg by mouth 2 (two) times daily.      hydrochlorothiazide  (HYDRODIURIL ) 25 MG tablet Take 25 mg by mouth daily.      lisinopril  (ZESTRIL ) 30 MG tablet Take 30 mg by mouth daily.      loperamide (IMODIUM) 2 MG capsule Take 2 mg by mouth as needed for diarrhea or loose stools. Take 1 capsule by mouth as needed with each loose stool/diarrhea nte 8 doses in 24 hr      Multiple Vitamin (DAILY VITE PO) Take 1 tablet by mouth daily.      naproxen  (NAPROSYN ) 500 MG tablet Take 500 mg by mouth every 12 (twelve) hours as needed.      omeprazole (PRILOSEC) 20 MG capsule Take 20 mg by mouth daily.      ondansetron  (ZOFRAN ) 4 MG tablet Take 4 mg by mouth every 8 (eight) hours as needed for nausea or vomiting.      potassium chloride  SA (KLOR-CON  M) 20 MEQ tablet Take 20 mEq by mouth 2 (two) times daily.      propranolol  (INDERAL ) 40 MG tablet Take 40 mg by mouth 2 (two) times daily.      rosuvastatin  (CRESTOR ) 5 MG tablet Take 5 mg by mouth daily.      sodium phosphate  (FLEET) ENEM As directed for procedure 133 mL 0    triamcinolone ointment (KENALOG) 0.1 % Apply topically.       Patient Stressors: Medication change or noncompliance    Patient Strengths: Ability for insight   Treatment Modalities: Medication Management, Group therapy, Case management,  1 to 1 session with clinician, Psychoeducation, Recreational therapy.   Physician Treatment Plan for Primary Diagnosis: Bilateral lower extremity edema Long Term Goal(s): Improvement in symptoms so as ready for discharge   Short Term Goals: Ability to identify changes in lifestyle to reduce recurrence of condition will improve Ability to verbalize feelings will  improve Ability to disclose and discuss suicidal ideas Ability to  demonstrate self-control will improve Ability to identify and develop effective coping behaviors will improve  Medication Management: Evaluate patient's response, side effects, and tolerance of medication regimen.  Therapeutic Interventions: 1 to 1 sessions, Unit Group sessions and Medication administration.  Evaluation of Outcomes: Progressing  Physician Treatment Plan for Secondary Diagnosis: Principal Problem:   Bilateral lower extremity edema Active Problems:   Hypercholesteremia   Schizoaffective disorder, bipolar type (HCC)   Chest pain   Sinus tachycardia   Hypotension   Controlled type 2 diabetes mellitus without complication, without long-term current use of insulin (HCC)   Hyponatremia  Long Term Goal(s): Improvement in symptoms so as ready for discharge   Short Term Goals: Ability to identify changes in lifestyle to reduce recurrence of condition will improve Ability to verbalize feelings will improve Ability to disclose and discuss suicidal ideas Ability to demonstrate self-control will improve Ability to identify and develop effective coping behaviors will improve     Medication Management: Evaluate patient's response, side effects, and tolerance of medication regimen.  Therapeutic Interventions: 1 to 1 sessions, Unit Group sessions and Medication administration.  Evaluation of Outcomes: Progressing   RN Treatment Plan for Primary Diagnosis: Bilateral lower extremity edema Long Term Goal(s): Knowledge of disease and therapeutic regimen to maintain health will improve  Short Term Goals: Ability to remain free from injury will improve, Ability to verbalize frustration and anger appropriately will improve, Ability to demonstrate self-control, Ability to participate in decision making will improve, Ability to verbalize feelings will improve, Ability to disclose and discuss suicidal ideas, Ability to  identify and develop effective coping behaviors will improve, and Compliance with prescribed medications will improve  Medication Management: RN will administer medications as ordered by provider, will assess and evaluate patient's response and provide education to patient for prescribed medication. RN will report any adverse and/or side effects to prescribing provider.  Therapeutic Interventions: 1 on 1 counseling sessions, Psychoeducation, Medication administration, Evaluate responses to treatment, Monitor vital signs and CBGs as ordered, Perform/monitor CIWA, COWS, AIMS and Fall Risk screenings as ordered, Perform wound care treatments as ordered.  Evaluation of Outcomes: Progressing   LCSW Treatment Plan for Primary Diagnosis: Bilateral lower extremity edema Long Term Goal(s): Safe transition to appropriate next level of care at discharge, Engage patient in therapeutic group addressing interpersonal concerns.  Short Term Goals: Engage patient in aftercare planning with referrals and resources, Increase social support, Increase ability to appropriately verbalize feelings, Increase emotional regulation, Facilitate acceptance of mental health diagnosis and concerns, Facilitate patient progression through stages of change regarding substance use diagnoses and concerns, Identify triggers associated with mental health/substance abuse issues, and Increase skills for wellness and recovery  Therapeutic Interventions: Assess for all discharge needs, 1 to 1 time with Social worker, Explore available resources and support systems, Assess for adequacy in community support network, Educate family and significant other(s) on suicide prevention, Complete Psychosocial Assessment, Interpersonal group therapy.  Evaluation of Outcomes: Progressing   Progress in Treatment: Attending groups: Yes. and No. Participating in groups: Yes. and No. Taking medication as prescribed: Yes. Toleration medication:  Yes. Family/Significant other contact made: Yes, individual(s) contacted:  Yes, pt's sister and mother  Patient understands diagnosis: Yes and No. Discussing patient identified problems/goals with staff: Yes. Medical problems stabilized or resolved: Yes. Denies suicidal/homicidal ideation: Yes. Issues/concerns per patient self-inventory: Yes. Other: none   New problem(s) identified: No, Describe:  none   New Short Term/Long Term Goal(s):elimination of symptoms of psychosis, medication management for mood stabilization; elimination  of SI thoughts; development of comprehensive mental wellness plan. 06/29/24 Update: No changes at this time. Update 07/04/24: No changes at this time Update 07/10/24: No changes at this time Update 07/10/24: No changes at this time 07/15/24 no changes Update 07/20/24: No changes at this time 07/15/24 no changes Update 07/25/24: No changes at this time Update 07/30/24: No changes at this time. Update 08/04/24: No changes at this time. Update 08/11/24: No changes at this time.     Patient Goals:    To continue to get well 06/29/24 Update: No changes at this time. Update 07/04/24: No changes at this time Update 07/10/24: No changes at this time Update 07/10/24: No changes at this time 07/15/24 no changes Update 07/20/24: No changes at this time 07/15/24 no changes Update 07/25/24: No changes at this time: Update 07/30/24: No changes at this time.  Update 08/04/24: No changes at this time. Update 08/11/24: No changes at this time.     Discharge Plan or Barriers: CSW will assist with appropriate discharge planning 06/29/24 Update:  CSW has made APS report to [facility]Ps following safety concerns. CSW to continue to coordinate with DSS worker to continue to assess. Update 07/04/24: No changes at this time Update 07/10/24: No changes at this time Update 07/10/24: DSS reports no changes at this time, DSS unsure if pt's family has filed for the guardianship. Caseworker Adams Louder  reports she will check in with CSW after New Years 07/15/2024 no changes Update 07/20/24: Pt's family filed for guardianship, court date scheduled for 08/25/23 Update 07/25/24: No changes at this time. Update 07/30/24: No changes at this time.  Update 08/04/24: No changes at this time. Update 08/11/24: Pt's family currently working towards placement and guardianship     Reason for Continuation of Hospitalization: Mania Medication stabilization    Estimated Length of Stay: TBD 07/15/24 Update 07/20/24: TBD Update 07/25/24: TBD Update 07/30/24: TBD  Update 08/04/24: TBD Update 08/11/24: TBD  Last 3 [place] Suicide Severity Risk Score: Flowsheet Row Admission (Current) from 06/19/2024 in [facility] ED from 06/18/2024 in [facility] Emergency Department at [facility] ED from 05/24/2022 in [facility] Department at [facility]  C-SSRS RISK CATEGORY No Risk No Risk No Risk    Last PHQ 2/9 Scores:    12/21/2022    9:00 AM  Depression screen PHQ 2/9  Decreased Interest 0  Down, Depressed, Hopeless 0  PHQ - 2 Score 0    Scribe for Treatment Team: Lum JONETTA Croft, [place] 08/11/2024 1:29 PM

## 2024-08-11 NOTE — Group Note (Signed)
 Date:  08/11/2024 Time:  10:03 AM  Group Topic/Focus:  Recovery Goals:   The focus of this group is to identify appropriate goals for recovery and establish a plan to achieve them.    Participation Level:  Minimal  Participation Quality:  Appropriate  Affect:  Appropriate  Cognitive:  Alert  Insight: Appropriate  Engagement in Group:  Engaged  Modes of Intervention:  Discussion  Additional Comments:     Maglione,Braelon Sprung E 08/11/2024, 10:03 AM

## 2024-08-11 NOTE — Group Note (Signed)
 Recreation Therapy Group Note   Group Topic:Leisure Education  Group Date: 08/11/2024 Start Time: 1405 End Time: 1455 Facilitators: Celestia Jeoffrey BRAVO, LRT, CTRS Location: Dayroom  Group Description: Leisure. Patients were given the option to choose from journaling, coloring, drawing, making origami, playing with playdoh, listening to music or singing karaoke. LRT and pts discussed the meaning of leisure, the importance of participating in leisure during their free time/when they're outside of the hospital, as well as how our leisure interests can also serve as coping skills.   Goal Area(s) Addressed:  Patient will identify a current leisure interest.  Patient will learn the definition of leisure. Patient will practice making a positive decision. Patient will have the opportunity to try a new leisure activity. Patient will communicate with peers and LRT.    Affect/Mood: N/A   Participation Level: Did not attend    Clinical Observations/Individualized Feedback: Patient did not attend.  Plan: Continue to engage patient in RT group sessions 2-3x/week.   Jeoffrey BRAVO Celestia, LRT, CTRS 08/11/2024 4:32 PM

## 2024-08-11 NOTE — Progress Notes (Signed)
 [facility] MD Progress Note   12:00 PM Valerie [name]  MRN:  [id number]   Valerie [name] is a 64 year old female presenting voluntary to [facility] requesting a psychiatric evaluation for worsening psychosis. Patient has history of schizophrenia. Patient denied SI, HI, psychosis and alcohol/drug usage. Patient resides at [facility]. Patient reports people are coming by my door talking loud, yelling and screaming and coming in my room with keys. Patient reports having multiple medical Afibs and blood clots due to this. Patient reports people are doing things in the dining room. When asked if she called EMS, patient stated I called EMS, I stripped searched over the phone and called the authorities. Patient is admitted to [facility] unit with Q15 min safety monitoring. Multidisciplinary team approach is offered. Medication management; group/milieu therapy is offered.   Sister Pang Robers : [phone]- called on 07/28/24  to update the med changes but went to generic voicemail  Subjective:  Chart reviewed, case discussed in multidisciplinary meeting, patient seen during rounds.   Patient offers no complaints today.  She remains excited about her family meeting with her sister and mother at 1 PM.  Patient continues to request Cogentin  to help with side effects from Clozaril  but is unable to specify the side effects.  Patient denies SI/HI/plan and denies hallucinations.  Provider and social worker met with the family during the family meeting and updated that patient is psychiatrically cleared for discharge.  Sister informed the team that the placement will be visiting her next week to assess her. Past Psychiatric History: see h&P Family History:  Family History  Problem Relation Age of Onset   Colon cancer Neg Hx    Celiac disease Neg Hx    Inflammatory bowel disease Neg Hx    Social History:  Social History   Substance and Sexual Activity  Alcohol Use Never     Social  History   Substance and Sexual Activity  Drug Use Never    Social History   Socioeconomic History   Marital status: Single    Spouse name: Not on file   Number of children: Not on file   Years of education: Not on file   Highest education level: Not on file  Occupational History   Not on file  Tobacco Use   Smoking status: Never   Smokeless tobacco: Never  Substance and Sexual Activity   Alcohol use: Never   Drug use: Never   Sexual activity: Not Currently    Birth control/protection: Pill  Other Topics Concern   Not on file  Social History Narrative   Not on file   Social Drivers of Health   Tobacco Use: Low Risk (06/19/2024)   Patient History    Smoking Tobacco Use: Never    Smokeless Tobacco Use: Never    Passive Exposure: Not on file  Financial Resource Strain: Not on file  Food Insecurity: Unknown (06/19/2024)   Epic    Worried About Programme Researcher, Broadcasting/film/video in the Last Year: Never true    [employer] of Food in the Last Year: Patient declined  Transportation Needs: No Transportation Needs (06/19/2024)   Epic    Lack of Transportation (Medical): No    Lack of Transportation (Non-Medical): No  Physical Activity: Not on file  Stress: Not on file  Social Connections: Not on file  Depression (PHQ2-9): Low Risk (12/21/2022)   Depression (PHQ2-9)    PHQ-2 Score: 0  Alcohol Screen: Low Risk (06/19/2024)  Alcohol Screen    Last Alcohol Screening Score (AUDIT): 0  Housing: Low Risk (06/19/2024)   Epic    Unable to Pay for Housing in the Last Year: No    Number of Times Moved in the Last Year: 0    Homeless in the Last Year: No  Utilities: Not At Risk (06/19/2024)   Epic    Threatened with loss of utilities: No  Health Literacy: Not on file   Past Medical History:  Past Medical History:  Diagnosis Date   Chronic lower extremity pain (2ry area of Pain) (Right) 12/21/2022   Chronic pain syndrome 12/20/2022   DDD (degenerative disc disease), 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 Tobie: Pancolonic diverticulosis, tortuous colon, next colonoscopy 2024   TONSILLECTOMY      Current Medications: Current Facility-Administered Medications  Medication Dose Route Frequency Provider Last Rate Last Admin   acetaminophen  (TYLENOL ) tablet 650 mg  650 mg Oral Q6H PRN Onuoha, Chinwendu V, NP   650 mg at 08/08/24 9060   ARIPiprazole  (ABILIFY ) tablet 30 mg  30 mg Oral Daily Onyekachi Gathright, MD   30 mg at 08/11/24 0947   aspirin  EC tablet 81 mg  81 mg Oral Daily Bobbitt, Shalon E, NP   81 mg at 08/11/24 0948   cholecalciferol  (VITAMIN D3) 25 MCG (1000 UNIT) tablet 1,000 Units  1,000 Units Oral Weekly Iseah Plouff, MD   1,000 Units at 08/08/24 1216   cloZAPine  (CLOZARIL ) tablet 150 mg  150 mg Oral BID Valton Schwartz, MD   150 mg at 08/11/24 9052   Or   OLANZapine  (ZYPREXA ) injection 10 mg  10 mg Intramuscular BID Chele Cornell, MD       dapagliflozin  propanediol (FARXIGA ) tablet 10 mg  10 mg Oral Daily Josette Ade, MD   10 mg at 08/11/24 9052   diclofenac  Sodium (VOLTAREN ) 1 % topical gel 4 g  4 g Topical QID PRN Madaram, Kondal R, MD       famotidine  (PEPCID ) tablet 40 mg  40 mg Oral Daily Shrivastava, Aryendra, MD   40 mg at 08/11/24 0946   fluticasone  (FLONASE ) 50 MCG/ACT nasal spray 1 spray  1 spray Each Nare Daily PRN Madaram, Kondal R, MD       furosemide  (LASIX ) tablet 40 mg  40 mg Oral 2 times per day Josette Ade, MD   40 mg at 08/11/24 0947   gabapentin  (NEURONTIN ) capsule 300 mg  300 mg Oral TID Bobbitt, Shalon E, NP   300 mg at 08/11/24 0948   hydrOXYzine  (ATARAX ) tablet 25 mg  25 mg Oral TID PRN Bobbitt, Shalon E, NP   25 mg at 08/10/24 2047   magnesium  hydroxide (MILK OF MAGNESIA) suspension 30 mL  30 mL Oral Daily PRN Bobbitt, Shalon E, NP   30 mL at 08/06/24 1047   metoprolol  succinate (TOPROL -XL)  24 hr tablet 25 mg  25 mg Oral BID Josette Ade, MD   25 mg at 08/11/24 0948   multivitamin with minerals tablet 1 tablet  1 tablet Oral Daily Bobbitt, Shalon E, NP   1 tablet at 08/11/24 0948   naproxen  (NAPROSYN ) tablet 375 mg  375 mg Oral TID WC Ahriyah Vannest, MD   375 mg at 08/11/24 1152   OLANZapine  (ZYPREXA ) injection 5 mg  5 mg  Intramuscular TID PRN Bobbitt, Shalon E, NP   5 mg at 06/22/24 2259   OLANZapine  zydis (ZYPREXA ) disintegrating tablet 5 mg  5 mg Oral TID PRN Bobbitt, Shalon E, NP   5 mg at 08/11/24 0948   ondansetron  (ZOFRAN ) tablet 4 mg  4 mg Oral Q8H PRN Bobbitt, Shalon E, NP   4 mg at 08/10/24 2126   Oxcarbazepine  (TRILEPTAL ) tablet 300 mg  300 mg Oral BID Atlas Kuc, MD   300 mg at 08/11/24 0948   potassium chloride  SA (KLOR-CON  M) CR tablet 20 mEq  20 mEq Oral BID Bobbitt, Shalon E, NP   20 mEq at 08/11/24 0946   rosuvastatin  (CRESTOR ) tablet 5 mg  5 mg Oral Daily Bobbitt, Shalon E, NP   5 mg at 08/11/24 0946   traZODone  (DESYREL ) tablet 50 mg  50 mg Oral QHS PRN Bobbitt, Shalon E, NP   50 mg at 08/07/24 2158   zolpidem  (AMBIEN ) tablet 10 mg  10 mg Oral QHS Tukker Byrns, MD   10 mg at 08/10/24 2047    Lab Results:  Results for orders placed or performed during the hospital encounter of 06/19/24 (from the past 48 hours)  CBC with Differential/Platelet     Status: Abnormal   Collection Time: 08/10/24 10:15 AM  Result Value Ref Range   WBC 7.7 4.0 - 10.5 K/uL   RBC 4.33 3.87 - 5.11 MIL/uL   Hemoglobin 11.7 (L) 12.0 - 15.0 g/dL   HCT 62.5 63.9 - 53.9 %   MCV 86.4 80.0 - 100.0 fL   MCH 27.0 26.0 - 34.0 pg   MCHC 31.3 30.0 - 36.0 g/dL   RDW 86.0 88.4 - 84.4 %   Platelets 317 150 - 400 K/uL   nRBC 0.0 0.0 - 0.2 %   Neutrophils Relative % 57 %   Neutro Abs 4.4 1.7 - 7.7 K/uL   Lymphocytes Relative 29 %   Lymphs Abs 2.2 0.7 - 4.0 K/uL   Monocytes Relative 9 %   Monocytes Absolute 0.7 0.1 - 1.0 K/uL   Eosinophils Relative 4 %   Eosinophils Absolute 0.3 0.0  - 0.5 K/uL   Basophils Relative 1 %   Basophils Absolute 0.1 0.0 - 0.1 K/uL   Immature Granulocytes 0 %   Abs Immature Granulocytes 0.02 0.00 - 0.07 K/uL    Comment: Performed at [facility], [address] [place], [place] [address]  Glucose, capillary     Status: Abnormal   Collection Time: 08/11/24  7:39 AM  Result Value Ref Range   Glucose-Capillary 108 (H) 70 - 99 mg/dL    Comment: Glucose reference range applies only to samples taken after fasting for at least 8 hours.        Blood Alcohol level:  Lab Results  Component Value Date   [facility] <15 06/18/2024    Metabolic Disorder Labs: Lab Results  Component Value Date   HGBA1C 5.9 (H) 06/28/2024   MPG 122.63 06/28/2024   No results found for: PROLACTIN Lab Results  Component Value Date   CHOL 156 06/28/2024   TRIG 89 06/28/2024   HDL 70 06/28/2024   CHOLHDL 2.2 06/28/2024   VLDL 18 06/28/2024   LDLCALC 69 06/28/2024    Physical Findings: AIMS:  , ,  ,  ,    CIWA:    COWS:      Psychiatric Specialty Exam:  Presentation  General Appearance:  Casual  Eye Contact: Fleeting  Speech: Normal Rate  Speech Volume: Increased    Mood and Affect  Mood: Euphoric  Affect: stable  Thought Process  Thought Processes: Improving illogical  Orientation:Full (Time, Place and Person)  Thought Content:Illogical; Delusions; Tangential  Hallucinations: Denies  Ideas of Reference:Delusions  Suicidal Thoughts: Denies  Homicidal Thoughts: Denies   Sensorium  Memory: Immediate Fair; Remote Fair  Judgment: Impaired  Insight: Shallow   Executive Functions  Concentration: Poor  Attention Span: Poor  Recall: [employer] of Knowledge: Fair  Language: Fair   Psychomotor Activity  Psychomotor Activity: No data recorded  Musculoskeletal: Strength & Muscle Tone: within normal limits Gait & Station: normal Assets  Assets: Manufacturing Systems Engineer; Desire for  Improvement    Physical Exam: Physical Exam Vitals and nursing note reviewed.    ROS Blood pressure (!) 148/73, pulse 90, temperature 97.6 F (36.4 C), resp. rate 17, height 5' 5 (1.651 m), weight 87.3 kg, SpO2 100%. Body mass index is 32.03 kg/m.  Diagnosis: Principal Problem:   Bilateral lower extremity edema Active Problems:   Hypercholesteremia   Schizoaffective disorder, bipolar type (HCC)   Chest pain   Sinus tachycardia   Hypotension   Controlled type 2 diabetes mellitus without complication, without long-term current use of insulin (HCC)   Hyponatremia  Schizoaffective disorder bipolar type  Clinical Decision Making: Patient is currently admitted for worsening psychosis, manic symptoms, tangential grandiosity.  She needs inpatient hospitalization for medication management and close monitoring.  Patient is unable to make decisions on her own as she is unable to appreciate and understand her mental health history, the current need for treatment.  [facility] has screened her case and for further evaluation for legal guardianship. [facility] screened in her case. Sister and mom are involved in her care.  Treatment Plan Summary:  Patient is not responding at all to Clozaril  in spite of increased dose and also poor response to Geodon  which was discontinued and started on Abilify  which was titrated to 20 mg today.  If patient remains manic/psychotic we will discuss a plan to switch Clozaril  to loxapine as patient has documented allergy to Risperdal/paliperidone. Safety and Monitoring:             -- inoluntary admission to inpatient psychiatric unit for safety, stabilization and treatment             -- Daily contact with patient to assess and evaluate symptoms and progress in treatment             -- Patient's case to be discussed in multi-disciplinary team meeting             -- Observation Level: q15 minute checks             -- Vital signs:  q12 hours             -- Precautions: suicide,  elopement, and assault   2. Psychiatric Diagnoses and Treatment:  Discontinue Depakote  as patient is noncompliant Discontinue Geodon  20 mg nightly as poor response Clozaril  150mg  twice daily  Cymbalta  40 mg daily-discontinue to minimize manic Trilepta 300 mg twice daily  LAI Abilify  Maintena- given 08/04/24 -- The risks/benefits/side-effects/alternatives to this medication were discussed in detail with the patient and time was given for questions. The patient consents to medication trial.                -- Metabolic profile and EKG monitoring obtained while on an atypical antipsychotic (BMI: Lipid Panel: HbgA1c: QTc:)              --  Encouraged patient to participate in unit milieu and in scheduled group therapies                            3. Medical Issues Being Addressed:   Chest pain Sinus tachycardia HTN (hypertension) Bilateral lower extremity edema Hypercholesteremia  12/30:  Troponin negative x 2. Echocardiogram done with pending results.  Small improvement in lower extremity edema so continuing p.o. Lasix .  Will need BMP every other day.  4. Discharge Planning:   -- Social work and case management to assist with discharge planning and identification of hospital follow-up needs prior to discharge  -- Estimated LOS: 3-4 days  Stephenia Vogan, MD 08/11/2024, 12:00 PM

## 2024-08-11 NOTE — Plan of Care (Signed)
   Problem: Education: Goal: Knowledge of [place] [facility] Education information/materials will improve Outcome: Progressing Goal: Emotional status will improve Outcome: Progressing Goal: Mental status will improve Outcome: Progressing Goal: Verbalization of understanding the information provided will improve Outcome: Progressing

## 2024-08-11 NOTE — Progress Notes (Signed)
" °   08/10/24 2300  Psych Admission Type (Psych Patients Only)  Admission Status Involuntary  Psychosocial Assessment  Patient Complaints Anxiety;Crying spells  Eye Contact Fair  Facial Expression Anxious  Affect Labile  Speech Tangential  Interaction Assertive  Motor Activity Slow  Appearance/Hygiene In scrubs  Behavior Characteristics Anxious;Restless  Mood Anxious;Preoccupied  Thought Process  Coherency Disorganized  Content Preoccupation  Delusions Paranoid;Religious  Perception WDL  Hallucination None reported or observed  Judgment Impaired  Confusion Mild  Danger to Self  Current suicidal ideation? Denies  Agreement Not to Harm Self Yes  Description of Agreement Verbal  Danger to Others  Danger to Others None reported or observed    "

## 2024-08-11 NOTE — Group Note (Signed)
 Physical/Occupational Therapy Group Note  Group Topic: Neurographic Art  Group Date: 08/11/2024 Start Time: 1315 End Time: 1400 Facilitators: Clive Warren CROME, OT   Group Description: Group participated with Neurographic art activity, using watercolor paints to facilitate creative expression and meditation/relaxation for each individual.  Incorporated bimanual coordination, mental focus, emotional processing, task/command following and relaxation techniques as appropriate.  Patients engaged socially with therapist and other group participants throughout session. Allowed to ask questions as appropriate, and encouraged to identify ways they could use/share their creations with themselves and others.  Therapeutic Goal(s):  Demonstrate ability to independently manipulate utensils required to participate with and complete activity. Demonstrate ability to cognitively focus on task and follow commands necessary for completion. Demonstrate use of art as an outlet for emotional processing and expression. Identify and demonstrate importance of relaxation, neural calming and meditation for improved participation with life groups.  Individual Participation: Pt did not attend.  Participation Level: Did not attend   Participation Quality:   Behavior:   Speech/Thought Process:   Affect/Mood:   Insight:   Judgement:   Modes of Intervention:   Patient Response to Interventions:    Plan: Continue to engage patient in PT/OT groups 1 - 2x/week.   Lannie Yusuf R., MPH, MS, OTR/L ascom [phone] 08/11/24, 4:16 PM

## 2024-08-12 LAB — GLUCOSE, CAPILLARY: Glucose-Capillary: 87 mg/dL (ref 70–99)

## 2024-08-12 NOTE — Progress Notes (Signed)
 [facility] MD Progress Note   12:11 PM Valerie [name]  MRN:  [id number]  Patient: Valerie [name] (64 year old female) Admission Status: Voluntary admission via [facility] for worsening psychosis. Residence: [facility] (ALF). Diagnoses: Schizophrenia; reported history of medical Afibs and blood clots (per patient).  History of Present Illness (HPI) Valerie [name] is a 64 year old female with a history of schizophrenia who presented voluntarily for psychiatric evaluation. Upon admission, she described a high level of paranoia regarding her residence, claiming that individuals were screaming outside her door and entering her room with keys. She expressed somatic delusions, attributing medical Afibs and blood clots to these environmental stressors. Her thought process was notably disorganized, evidenced by her claim that she was strip-searched over the phone while calling authorities.  The patient was admitted to the Geropsychiatry unit under Q15-minute safety monitoring. She has since participated in a multidisciplinary treatment plan including group therapy and medication management.  Subjective Case Status: Chart reviewed; case discussed in multidisciplinary meeting; family meeting conducted.  The patient reports a significant improvement in her sense of well-being following a visit from her family. While she remains delusional regarding the safety of her previous residence and her physical health, her medication adherence has improved notably compared to her baseline prior to admission.  Current Requests: The patient requested Cogentin  to manage side effects from her Clozapine  (Clozaril ); however, she was unable to specify or describe the side effects she is experiencing.  Psychiatric Symptoms: Patient denies suicidal ideation (SI), homicidal ideation (HI), or plans. She denies active auditory or visual hallucinations at this time.  Family Meeting/Discharge Planning: A  formal meeting was held with the provider, social worker, [name]), and the patient's mother. The team informed the family that the patient is now psychiatrically cleared for discharge.  Placement Update: The patients sister reported that a potential new facility will conduct an on-site assessment of the patient next week to determine suitability for transfer. Past Psychiatric History: see h&P Family History:  Family History  Problem Relation Age of Onset   Colon cancer Neg Hx    Celiac disease Neg Hx    Inflammatory bowel disease Neg Hx    Social History:  Social History   Substance and Sexual Activity  Alcohol Use Never     Social History   Substance and Sexual Activity  Drug Use Never    Social History   Socioeconomic History   Marital status: Single    Spouse name: Not on file   Number of children: Not on file   Years of education: Not on file   Highest education level: Not on file  Occupational History   Not on file  Tobacco Use   Smoking status: Never   Smokeless tobacco: Never  Substance and Sexual Activity   Alcohol use: Never   Drug use: Never   Sexual activity: Not Currently    Birth control/protection: Pill  Other Topics Concern   Not on file  Social History Narrative   Not on file   Social Drivers of Health   Tobacco Use: Low Risk (06/19/2024)   Patient History    Smoking Tobacco Use: Never    Smokeless Tobacco Use: Never    Passive Exposure: Not on file  Financial Resource Strain: Not on file  Food Insecurity: Unknown (06/19/2024)   Epic    Worried About Programme Researcher, Broadcasting/film/video in the Last Year: Never true    [employer] of Food in the Last Year: Patient declined  Transportation Needs: No Transportation Needs (06/19/2024)   Epic    Lack of Transportation (Medical): No    Lack of Transportation (Non-Medical): No  Physical Activity: Not on file  Stress: Not on file  Social Connections: Not on file  Depression (PHQ2-9): Low Risk (12/21/2022)    Depression (PHQ2-9)    PHQ-2 Score: 0  Alcohol Screen: Low Risk (06/19/2024)   Alcohol Screen    Last Alcohol Screening Score (AUDIT): 0  Housing: Low Risk (06/19/2024)   Epic    Unable to Pay for Housing in the Last Year: No    Number of Times Moved in the Last Year: 0    Homeless in the Last Year: No  Utilities: Not At Risk (06/19/2024)   Epic    Threatened with loss of utilities: No  Health Literacy: Not on file   Past Medical History:  Past Medical History:  Diagnosis Date   Chronic lower extremity pain (2ry area of Pain) (Right) 12/21/2022   Chronic pain syndrome 12/20/2022   DDD (degenerative disc disease), 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 Tobie: Pancolonic diverticulosis, tortuous colon, next colonoscopy 2024   TONSILLECTOMY      Current Medications: Current Facility-Administered Medications  Medication Dose Route Frequency Provider Last Rate Last Admin   acetaminophen  (TYLENOL ) tablet 650 mg  650 mg Oral Q6H PRN Onuoha, Chinwendu V, NP   650 mg at 08/11/24 1428   alum & mag hydroxide-simeth (MAALOX/MYLANTA) 200-200-20 MG/5ML suspension 30 mL  30 mL Oral Q8H PRN Ajibola, Ene A, NP   30 mL at 08/11/24 2107   ARIPiprazole  (ABILIFY ) tablet 30 mg  30 mg Oral Daily Jadapalle, Sree, MD   30 mg at 08/12/24 0845   aspirin  EC tablet 81 mg  81 mg Oral Daily Bobbitt, Shalon E, NP   81 mg at 08/12/24 0845   benztropine  (COGENTIN ) tablet 0.5 mg  0.5 mg Oral Daily PRN Jadapalle, Sree, MD       cholecalciferol  (VITAMIN D3) 25 MCG (1000 UNIT) tablet 1,000 Units  1,000 Units Oral Weekly Jadapalle, Sree, MD   1,000 Units at 08/08/24 1216   cloZAPine  (CLOZARIL ) tablet 150 mg  150 mg Oral BID Jadapalle, Sree, MD   150 mg at 08/12/24 9145   Or   OLANZapine  (ZYPREXA ) injection 10 mg  10 mg Intramuscular BID Jadapalle, Sree, MD        dapagliflozin  propanediol (FARXIGA ) tablet 10 mg  10 mg Oral Daily Josette Ade, MD   10 mg at 08/12/24 9143   diclofenac  Sodium (VOLTAREN ) 1 % topical gel 4 g  4 g Topical QID PRN Lenni Reckner R, MD       famotidine  (PEPCID ) tablet 40 mg  40 mg Oral Daily Shrivastava, Aryendra, MD   40 mg at 08/12/24 0845   fluticasone  (FLONASE ) 50 MCG/ACT nasal spray 1 spray  1 spray Each Nare Daily PRN Eiden Bagot R, MD       furosemide  (LASIX ) tablet 40 mg  40 mg Oral 2 times per day Josette Ade, MD   40 mg at 08/12/24 0846   gabapentin  (NEURONTIN ) capsule 300 mg  300 mg Oral TID Bobbitt, Shalon E, NP   300 mg at 08/12/24 0846   hydrOXYzine  (ATARAX ) tablet 25 mg  25 mg Oral TID PRN Bobbitt, Shalon E, NP  25 mg at 08/12/24 1205   magnesium  hydroxide (MILK OF MAGNESIA) suspension 30 mL  30 mL Oral Daily PRN Bobbitt, Shalon E, NP   30 mL at 08/06/24 1047   metoprolol  succinate (TOPROL -XL) 24 hr tablet 25 mg  25 mg Oral BID Josette Ade, MD   25 mg at 08/12/24 0845   multivitamin with minerals tablet 1 tablet  1 tablet Oral Daily Bobbitt, Shalon E, NP   1 tablet at 08/12/24 0845   naproxen  (NAPROSYN ) tablet 375 mg  375 mg Oral TID WC Jadapalle, Sree, MD   375 mg at 08/12/24 1208   OLANZapine  (ZYPREXA ) injection 5 mg  5 mg Intramuscular TID PRN Bobbitt, Shalon E, NP   5 mg at 06/22/24 2259   OLANZapine  zydis (ZYPREXA ) disintegrating tablet 5 mg  5 mg Oral TID PRN Bobbitt, Shalon E, NP   5 mg at 08/11/24 0948   ondansetron  (ZOFRAN ) tablet 4 mg  4 mg Oral Q8H PRN Bobbitt, Shalon E, NP   4 mg at 08/10/24 2126   Oxcarbazepine  (TRILEPTAL ) tablet 300 mg  300 mg Oral BID Jadapalle, Sree, MD   300 mg at 08/12/24 0846   potassium chloride  SA (KLOR-CON  M) CR tablet 20 mEq  20 mEq Oral BID Bobbitt, Shalon E, NP   20 mEq at 08/12/24 0844   rosuvastatin  (CRESTOR ) tablet 5 mg  5 mg Oral Daily Bobbitt, Shalon E, NP   5 mg at 08/12/24 0844   traZODone  (DESYREL ) tablet 50 mg  50 mg Oral QHS PRN Bobbitt, Shalon E,  NP   50 mg at 08/07/24 2158   zolpidem  (AMBIEN ) tablet 10 mg  10 mg Oral QHS Jadapalle, Sree, MD   10 mg at 08/11/24 2107    Lab Results:  Results for orders placed or performed during the hospital encounter of 06/19/24 (from the past 48 hours)  Glucose, capillary     Status: Abnormal   Collection Time: 08/11/24  7:39 AM  Result Value Ref Range   Glucose-Capillary 108 (H) 70 - 99 mg/dL    Comment: Glucose reference range applies only to samples taken after fasting for at least 8 hours.  Glucose, capillary     Status: None   Collection Time: 08/12/24  7:14 AM  Result Value Ref Range   Glucose-Capillary 87 70 - 99 mg/dL    Comment: Glucose reference range applies only to samples taken after fasting for at least 8 hours.        Blood Alcohol level:  Lab Results  Component Value Date   [facility] <15 06/18/2024    Metabolic Disorder Labs: Lab Results  Component Value Date   HGBA1C 5.9 (H) 06/28/2024   MPG 122.63 06/28/2024   No results found for: PROLACTIN Lab Results  Component Value Date   CHOL 156 06/28/2024   TRIG 89 06/28/2024   HDL 70 06/28/2024   CHOLHDL 2.2 06/28/2024   VLDL 18 06/28/2024   LDLCALC 69 06/28/2024    Physical Findings: AIMS:  , ,  ,  ,    CIWA:    COWS:      Psychiatric Specialty Exam:  Presentation  General Appearance:  Casual  Eye Contact: Fleeting  Speech: Normal Rate  Speech Volume: Increased    Mood and Affect  Mood: Euphoric  Affect: stable  Thought Process  Thought Processes: Improving illogical  Orientation:Full (Time, Place and Person)  Thought Content:Illogical; Delusions; Tangential  Hallucinations: Denies  Ideas of Reference:Delusions  Suicidal Thoughts: Denies  Homicidal Thoughts:  Denies   Sensorium  Memory: Immediate Fair; Remote Fair  Judgment: Impaired  Insight: Shallow   Executive Functions  Concentration: Poor  Attention Span: Poor  Recall: [employer] of  Knowledge: Fair  Language: Fair   Psychomotor Activity  Psychomotor Activity: No data recorded  Musculoskeletal: Strength & Muscle Tone: within normal limits Gait & Station: normal Assets  Assets: Manufacturing Systems Engineer; Desire for Improvement    Physical Exam: Physical Exam Vitals and nursing note reviewed.    ROS Blood pressure 123/80, pulse (!) 101, temperature (!) 97.5 F (36.4 C), resp. rate 17, height 5' 5 (1.651 m), weight 87.3 kg, SpO2 100%. Body mass index is 32.03 kg/m.  Diagnosis: Principal Problem:   Bilateral lower extremity edema Active Problems:   Hypercholesteremia   Schizoaffective disorder, bipolar type (HCC)   Chest pain   Sinus tachycardia   Hypotension   Controlled type 2 diabetes mellitus without complication, without long-term current use of insulin (HCC)   Hyponatremia  Schizoaffective disorder bipolar type  Clinical Decision Making: Patient is currently admitted for worsening psychosis, manic symptoms, tangential grandiosity.  She needs inpatient hospitalization for medication management and close monitoring.  Patient is unable to make decisions on her own as she is unable to appreciate and understand her mental health history, the current need for treatment.  [facility] has screened her case and for further evaluation for legal guardianship. [facility] screened in her case. Sister and mom are involved in her care.  Treatment Plan Summary:  Patient is not responding at all to Clozaril  in spite of increased dose and also poor response to Geodon  which was discontinued and started on Abilify  which was titrated to 20 mg today.  If patient remains manic/psychotic we will discuss a plan to switch Clozaril  to loxapine as patient has documented allergy to Risperdal/paliperidone. Safety and Monitoring:             -- inoluntary admission to inpatient psychiatric unit for safety, stabilization and treatment             -- Daily contact with patient to assess and  evaluate symptoms and progress in treatment             -- Patient's case to be discussed in multi-disciplinary team meeting             -- Observation Level: q15 minute checks             -- Vital signs:  q12 hours             -- Precautions: suicide, elopement, and assault   2. Psychiatric Diagnoses and Treatment:  Discontinue Depakote  as patient is noncompliant Discontinue Geodon  20 mg nightly as poor response Clozaril  150mg  twice daily  Cymbalta  40 mg daily-discontinue to minimize manic Trilepta 300 mg twice daily  LAI Abilify  Maintena- given 08/04/24 -- The risks/benefits/side-effects/alternatives to this medication were discussed in detail with the patient and time was given for questions. The patient consents to medication trial.                -- Metabolic profile and EKG monitoring obtained while on an atypical antipsychotic (BMI: Lipid Panel: HbgA1c: QTc:)              -- Encouraged patient to participate in unit milieu and in scheduled group therapies                            3. Medical  Issues Being Addressed:   Chest pain Sinus tachycardia HTN (hypertension) Bilateral lower extremity edema Hypercholesteremia  12/30:  Troponin negative x 2. Echocardiogram done with pending results.  Small improvement in lower extremity edema so continuing p.o. Lasix .  Will need BMP every other day.  4. Discharge Planning:   -- Social work and case management to assist with discharge planning and identification of hospital follow-up needs prior to discharge  -- Estimated LOS: 3-4 days  Millie JONELLE Manners, MD 08/12/2024, 12:11 PM

## 2024-08-12 NOTE — Plan of Care (Signed)
  Problem: Safety: Goal: Periods of time without injury will increase Outcome: Progressing   Problem: Activity: Goal: Will verbalize the importance of balancing activity with adequate rest periods Outcome: Progressing

## 2024-08-12 NOTE — Plan of Care (Signed)
" °  Problem: Education: Goal: Knowledge of [facility] Education information/materials will improve Outcome: Progressing Goal: Emotional status will improve Outcome: Progressing Goal: Mental status will improve Outcome: Progressing Goal: Verbalization of understanding the information provided will improve Outcome: Progressing   Problem: Activity: Goal: Interest or engagement in activities will improve Outcome: Progressing Goal: Sleeping patterns will improve Outcome: Progressing   Problem: Coping: Goal: Ability to verbalize frustrations and anger appropriately will improve Outcome: Progressing Goal: Ability to demonstrate self-control will improve Outcome: Progressing   Problem: Health Behavior/Discharge Planning: Goal: Identification of resources available to assist in meeting health care needs will improve Outcome: Progressing Goal: Compliance with treatment plan for underlying cause of condition will improve Outcome: Progressing   Problem: Physical Regulation: Goal: Ability to maintain clinical measurements within normal limits will improve Outcome: Progressing   Problem: Safety: Goal: Periods of time without injury will increase Outcome: Progressing   Problem: Education: Goal: Ability to state activities that reduce stress will improve Outcome: Progressing   Problem: Self-Concept: Goal: Ability to identify factors that promote anxiety will improve Outcome: Progressing Goal: Level of anxiety will decrease Outcome: Progressing Goal: Ability to modify response to factors that promote anxiety will improve Outcome: Progressing   Problem: Activity: Goal: Will verbalize the importance of balancing activity with adequate rest periods Outcome: Progressing   "

## 2024-08-12 NOTE — Group Note (Signed)
 Date:  08/12/2024 Time:  5:44 AM  Group Topic/Focus:  Wrap-Up Group:   The focus of this group is to help patients review their daily goal of treatment and discuss progress on daily workbooks.    Participation Level:  Active  Participation Quality:  Appropriate  Affect:  Appropriate  Cognitive:  Alert and Appropriate  Insight: Appropriate  Engagement in Group:  Engaged  Modes of Intervention:  Discussion  Additional Comments:    Drena Ham C Thomes Burak 08/12/2024, 5:44 AM

## 2024-08-12 NOTE — Progress Notes (Signed)
" °   08/11/24 2000  Psych Admission Type (Psych Patients Only)  Admission Status Involuntary  Psychosocial Assessment  Patient Complaints Anxiety;Worrying  Eye Contact Darting  Facial Expression Anxious;Worried  Affect Labile  Consulting Civil Engineer Activity Slow  Appearance/Hygiene Unremarkable  Behavior Characteristics Anxious;Intrusive  Mood Anxious;Preoccupied  Thought Process  Coherency Disorganized  Content Preoccupation  Delusions Paranoid;Grandeur  Perception WDL  Hallucination None reported or observed  Judgment Impaired  Confusion Mild  Danger to Self  Current suicidal ideation? Denies  Agreement Not to Harm Self Yes  Description of Agreement verbal  Danger to Others  Danger to Others None reported or observed   Mood/Behavior:  Anxious, worried and preoccupied. Pt intrusive yelling out to writer then standing in the door way stating come on while writer inside of another pts room. Pt then noted to ask for a cola. Pt preoccupied stating I have to be famous to help a lot of people.    Psych assessment: Denies SI/HI and AVH.     Interaction / Group attendance:  Present in the milieu. Minimal interaction with peers and staff.  Attended group.   Medication/ PRNs: Compliant with scheduled medications. Required PRNs Atarax  for anxiety and noted effective. Pt required prn Maalox for indigestion noted effective.   Pain: Denies   15 min checks in place for safety "

## 2024-08-12 NOTE — Group Note (Signed)
 Date:  08/12/2024 Time:  2:08 PM  Group Topic/Focus:  Movement Therapy    Participation Level:  Active  Participation Quality:  Appropriate  Affect:  Appropriate  Cognitive:  Appropriate  Insight: Appropriate  Engagement in Group:  Engaged  Modes of Intervention:  Activity  Additional Comments:  none  [name] 08/12/2024, 2:08 PM

## 2024-08-12 NOTE — Group Note (Signed)
 Date:  08/12/2024 Time:  8:53 PM  Group Topic/Focus:  Managing Feelings:   The focus of this group is to identify what feelings patients have difficulty handling and develop a plan to handle them in a healthier way upon discharge.    Participation Level:  Active  Participation Quality:  Appropriate  Affect:  Appropriate  Cognitive:  Appropriate  Insight: Appropriate  Engagement in Group:  Engaged  Modes of Intervention:  Activity and Discussion  Additional Comments:    Valerie [name] 08/12/2024, 8:53 PM

## 2024-08-13 DIAGNOSIS — F250 Schizoaffective disorder, bipolar type: Secondary | ICD-10-CM | POA: Diagnosis not present

## 2024-08-13 LAB — GLUCOSE, CAPILLARY: Glucose-Capillary: 95 mg/dL (ref 70–99)

## 2024-08-13 NOTE — Group Note (Signed)
 Date:  08/13/2024 Time:  9:25 PM  Group Topic/Focus:  Wrap-Up Group:   The focus of this group is to help patients review their daily goal of treatment and discuss progress on daily workbooks.    Participation Level:  Active  Participation Quality:  Appropriate  Affect:  Appropriate  Cognitive:  Alert  Insight: Appropriate  Engagement in Group:  Engaged  Modes of Intervention:  Discussion  Additional Comments:    Valerie [name] 08/13/2024, 9:25 PM

## 2024-08-13 NOTE — Plan of Care (Signed)
  Problem: Education: Goal: Ability to state activities that reduce stress will improve Outcome: Progressing   Problem: Self-Concept: Goal: Ability to identify factors that promote anxiety will improve Outcome: Progressing

## 2024-08-13 NOTE — Progress Notes (Signed)
 [facility] MD Progress Note   1:52 PM Maelee Hoot  MRN:  [id number]   Natalye Kott is a 64 year old female presenting voluntary to [facility] requesting a psychiatric evaluation for worsening psychosis. Patient has history of schizophrenia. Patient denied SI, HI, psychosis and alcohol/drug usage. Patient resides at [facility]. Patient reports people are coming by my door talking loud, yelling and screaming and coming in my room with keys. Patient reports having multiple medical Afibs and blood clots due to this. Patient reports people are doing things in the dining room. When asked if she called EMS, patient stated I called EMS, I stripped searched over the phone and called the authorities. Patient is admitted to [facility] unit with Q15 min safety monitoring. Multidisciplinary team approach is offered. Medication management; group/milieu therapy is offered.   Sister Corene Resnick : [phone]- called on 07/28/24  to update the med changes but went to generic voicemail  Subjective:  Chart reviewed, case discussed in multidisciplinary meeting, patient seen during rounds.   Patient is noted to be standing at the nurses station.  She request the provider that she does not need to be on Abilify  along with Clozaril .  She reports Clozaril  helps her to stay calm.  Patient did acknowledge that she continues to have intermittent explosive episodes.  Patient is aware of Abilify  maintainer.  Provider discussed the ultimate plan of going to monotherapy if patient remains consistently well and stable psychiatrically in the community.  Patient denies hallucinations and denies SI/HI/plan. Past Psychiatric History: see h&P Family History:  Family History  Problem Relation Age of Onset   Colon cancer Neg Hx    Celiac disease Neg Hx    Inflammatory bowel disease Neg Hx    Social History:  Social History   Substance and Sexual Activity  Alcohol Use Never     Social History    Substance and Sexual Activity  Drug Use Never    Social History   Socioeconomic History   Marital status: Single    Spouse name: Not on file   Number of children: Not on file   Years of education: Not on file   Highest education level: Not on file  Occupational History   Not on file  Tobacco Use   Smoking status: Never   Smokeless tobacco: Never  Substance and Sexual Activity   Alcohol use: Never   Drug use: Never   Sexual activity: Not Currently    Birth control/protection: Pill  Other Topics Concern   Not on file  Social History Narrative   Not on file   Social Drivers of Health   Tobacco Use: Low Risk (06/19/2024)   Patient History    Smoking Tobacco Use: Never    Smokeless Tobacco Use: Never    Passive Exposure: Not on file  Financial Resource Strain: Not on file  Food Insecurity: Unknown (06/19/2024)   Epic    Worried About Programme Researcher, Broadcasting/film/video in the Last Year: Never true    [employer] of Food in the Last Year: Patient declined  Transportation Needs: No Transportation Needs (06/19/2024)   Epic    Lack of Transportation (Medical): No    Lack of Transportation (Non-Medical): No  Physical Activity: Not on file  Stress: Not on file  Social Connections: Not on file  Depression (PHQ2-9): Low Risk (12/21/2022)   Depression (PHQ2-9)    PHQ-2 Score: 0  Alcohol Screen: Low Risk (06/19/2024)   Alcohol Screen  Last Alcohol Screening Score (AUDIT): 0  Housing: Low Risk (06/19/2024)   Epic    Unable to Pay for Housing in the Last Year: No    Number of Times Moved in the Last Year: 0    Homeless in the Last Year: No  Utilities: Not At Risk (06/19/2024)   Epic    Threatened with loss of utilities: No  Health Literacy: Not on file   Past Medical History:  Past Medical History:  Diagnosis Date   Chronic lower extremity pain (2ry area of Pain) (Right) 12/21/2022   Chronic pain syndrome 12/20/2022   DDD (degenerative disc disease), 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 Tobie: Pancolonic diverticulosis, tortuous colon, next colonoscopy 2024   TONSILLECTOMY      Current Medications: Current Facility-Administered Medications  Medication Dose Route Frequency Provider Last Rate Last Admin   acetaminophen  (TYLENOL ) tablet 650 mg  650 mg Oral Q6H PRN Onuoha, Chinwendu V, NP   650 mg at 08/11/24 1428   alum & mag hydroxide-simeth (MAALOX/MYLANTA) 200-200-20 MG/5ML suspension 30 mL  30 mL Oral Q8H PRN Ajibola, Ene A, NP   30 mL at 08/13/24 1246   ARIPiprazole  (ABILIFY ) tablet 30 mg  30 mg Oral Daily Dorianne Perret, MD   30 mg at 08/13/24 9068   aspirin  EC tablet 81 mg  81 mg Oral Daily Bobbitt, Shalon E, NP   81 mg at 08/13/24 9068   benztropine  (COGENTIN ) tablet 0.5 mg  0.5 mg Oral Daily PRN Helmut Hennon, MD       cholecalciferol  (VITAMIN D3) 25 MCG (1000 UNIT) tablet 1,000 Units  1,000 Units Oral Weekly Reece Fehnel, MD   1,000 Units at 08/08/24 1216   cloZAPine  (CLOZARIL ) tablet 150 mg  150 mg Oral BID Amisadai Woodford, MD   150 mg at 08/13/24 9067   Or   OLANZapine  (ZYPREXA ) injection 10 mg  10 mg Intramuscular BID Deania Siguenza, MD       dapagliflozin  propanediol (FARXIGA ) tablet 10 mg  10 mg Oral Daily Josette Ade, MD   10 mg at 08/13/24 9068   diclofenac  Sodium (VOLTAREN ) 1 % topical gel 4 g  4 g Topical QID PRN Madaram, Kondal R, MD       famotidine  (PEPCID ) tablet 40 mg  40 mg Oral Daily Shrivastava, Aryendra, MD   40 mg at 08/13/24 0932   fluticasone  (FLONASE ) 50 MCG/ACT nasal spray 1 spray  1 spray Each Nare Daily PRN Madaram, Kondal R, MD       furosemide  (LASIX ) tablet 40 mg  40 mg Oral 2 times per day Josette Ade, MD   40 mg at 08/13/24 0930   gabapentin  (NEURONTIN ) capsule 300 mg  300 mg Oral TID Bobbitt, Shalon E, NP   300 mg at 08/13/24 0932   hydrOXYzine  (ATARAX ) tablet 25  mg  25 mg Oral TID PRN Bobbitt, Shalon E, NP   25 mg at 08/12/24 1205   magnesium  hydroxide (MILK OF MAGNESIA) suspension 30 mL  30 mL Oral Daily PRN Bobbitt, Shalon E, NP   30 mL at 08/06/24 1047   metoprolol  succinate (TOPROL -XL) 24 hr tablet 25 mg  25 mg Oral BID Josette Ade, MD   25 mg at 08/13/24 0930   multivitamin with minerals tablet 1 tablet  1 tablet Oral Daily  Bobbitt, Shalon E, NP   1 tablet at 08/13/24 0931   naproxen  (NAPROSYN ) tablet 375 mg  375 mg Oral TID WC Antonela Freiman, MD   375 mg at 08/13/24 1246   OLANZapine  (ZYPREXA ) injection 5 mg  5 mg Intramuscular TID PRN Bobbitt, Shalon E, NP   5 mg at 06/22/24 2259   OLANZapine  zydis (ZYPREXA ) disintegrating tablet 5 mg  5 mg Oral TID PRN Bobbitt, Shalon E, NP   5 mg at 08/11/24 0948   ondansetron  (ZOFRAN ) tablet 4 mg  4 mg Oral Q8H PRN Bobbitt, Shalon E, NP   4 mg at 08/10/24 2126   Oxcarbazepine  (TRILEPTAL ) tablet 300 mg  300 mg Oral BID Leoma Folds, MD   300 mg at 08/13/24 9068   potassium chloride  SA (KLOR-CON  M) CR tablet 20 mEq  20 mEq Oral BID Bobbitt, Shalon E, NP   20 mEq at 08/13/24 0930   rosuvastatin  (CRESTOR ) tablet 5 mg  5 mg Oral Daily Bobbitt, Shalon E, NP   5 mg at 08/13/24 0931   traZODone  (DESYREL ) tablet 50 mg  50 mg Oral QHS PRN Bobbitt, Shalon E, NP   50 mg at 08/07/24 2158   zolpidem  (AMBIEN ) tablet 10 mg  10 mg Oral QHS Cammi Consalvo, MD   10 mg at 08/12/24 2103    Lab Results:  Results for orders placed or performed during the hospital encounter of 06/19/24 (from the past 48 hours)  Glucose, capillary     Status: None   Collection Time: 08/12/24  7:14 AM  Result Value Ref Range   Glucose-Capillary 87 70 - 99 mg/dL    Comment: Glucose reference range applies only to samples taken after fasting for at least 8 hours.  Glucose, capillary     Status: None   Collection Time: 08/13/24  7:19 AM  Result Value Ref Range   Glucose-Capillary 95 70 - 99 mg/dL    Comment: Glucose reference range applies  only to samples taken after fasting for at least 8 hours.        Blood Alcohol level:  Lab Results  Component Value Date   [facility] <15 06/18/2024    Metabolic Disorder Labs: Lab Results  Component Value Date   HGBA1C 5.9 (H) 06/28/2024   MPG 122.63 06/28/2024   No results found for: PROLACTIN Lab Results  Component Value Date   CHOL 156 06/28/2024   TRIG 89 06/28/2024   HDL 70 06/28/2024   CHOLHDL 2.2 06/28/2024   VLDL 18 06/28/2024   LDLCALC 69 06/28/2024    Physical Findings: AIMS:  , ,  ,  ,    CIWA:    COWS:      Psychiatric Specialty Exam:  Presentation  General Appearance:  Casual  Eye Contact: Fleeting  Speech: Normal Rate  Speech Volume: Increased    Mood and Affect  Mood: Euphoric  Affect: stable  Thought Process  Thought Processes: Improving illogical  Orientation:Full (Time, Place and Person)  Thought Content:Illogical; Delusions; Tangential  Hallucinations: Denies  Ideas of Reference:Delusions  Suicidal Thoughts: Denies  Homicidal Thoughts: Denies   Sensorium  Memory: Immediate Fair; Remote Fair  Judgment: Impaired  Insight: Shallow   Executive Functions  Concentration: Poor  Attention Span: Poor  Recall: [employer] of Knowledge: Fair  Language: Fair   Psychomotor Activity  Psychomotor Activity: No data recorded  Musculoskeletal: Strength & Muscle Tone: within normal limits Gait & Station: normal Assets  Assets: Manufacturing Systems Engineer; Desire for Improvement  Physical Exam: Physical Exam Vitals and nursing note reviewed.    ROS Blood pressure 127/64, pulse 90, temperature (!) 97.1 F (36.2 C), resp. rate 20, height 5' 5 (1.651 m), weight 87.3 kg, SpO2 99%. Body mass index is 32.03 kg/m.  Diagnosis: Principal Problem:   Bilateral lower extremity edema Active Problems:   Hypercholesteremia   Schizoaffective disorder, bipolar type (HCC)   Chest pain   Sinus tachycardia    Hypotension   Controlled type 2 diabetes mellitus without complication, without long-term current use of insulin (HCC)   Hyponatremia  Schizoaffective disorder bipolar type  Clinical Decision Making: Patient is currently admitted for worsening psychosis, manic symptoms, tangential grandiosity.  She needs inpatient hospitalization for medication management and close monitoring.  Patient is unable to make decisions on her own as she is unable to appreciate and understand her mental health history, the current need for treatment.  [facility] has screened her case and for further evaluation for legal guardianship. [facility] screened in her case. Sister and mom are involved in her care.  Treatment Plan Summary:  Patient is not responding at all to Clozaril  in spite of increased dose and also poor response to Geodon  which was discontinued and started on Abilify  which was titrated to 20 mg today.  If patient remains manic/psychotic we will discuss a plan to switch Clozaril  to loxapine as patient has documented allergy to Risperdal/paliperidone. Safety and Monitoring:             -- inoluntary admission to inpatient psychiatric unit for safety, stabilization and treatment             -- Daily contact with patient to assess and evaluate symptoms and progress in treatment             -- Patient's case to be discussed in multi-disciplinary team meeting             -- Observation Level: q15 minute checks             -- Vital signs:  q12 hours             -- Precautions: suicide, elopement, and assault   2. Psychiatric Diagnoses and Treatment:  Discontinue Depakote  as patient is noncompliant Discontinue Geodon  20 mg nightly as poor response Clozaril  150mg  twice daily  Cymbalta  40 mg daily-discontinue to minimize manic Trilepta 300 mg twice daily  LAI Abilify  Maintena- given 08/04/24 -- The risks/benefits/side-effects/alternatives to this medication were discussed in detail with the patient and time was given for  questions. The patient consents to medication trial.                -- Metabolic profile and EKG monitoring obtained while on an atypical antipsychotic (BMI: Lipid Panel: HbgA1c: QTc:)              -- Encouraged patient to participate in unit milieu and in scheduled group therapies                            3. Medical Issues Being Addressed:   Chest pain Sinus tachycardia HTN (hypertension) Bilateral lower extremity edema Hypercholesteremia  12/30:  Troponin negative x 2. Echocardiogram done with pending results.  Small improvement in lower extremity edema so continuing p.o. Lasix .  Will need BMP every other day.  4. Discharge Planning:   -- Social work and case management to assist with discharge planning and identification of hospital follow-up needs prior to discharge  --  Estimated LOS: 3-4 days  Orie Baxendale, MD 08/13/2024, 1:52 PM

## 2024-08-13 NOTE — Plan of Care (Signed)
  Problem: Coping: Goal: Ability to verbalize frustrations and anger appropriately will improve Outcome: Progressing Goal: Ability to demonstrate self-control will improve Outcome: Progressing   Problem: Health Behavior/Discharge Planning: Goal: Identification of resources available to assist in meeting health care needs will improve Outcome: Progressing Goal: Compliance with treatment plan for underlying cause of condition will improve Outcome: Progressing   Problem: Safety: Goal: Periods of time without injury will increase Outcome: Progressing

## 2024-08-13 NOTE — Group Note (Signed)
 Date:  08/13/2024 Time:  10:38 AM  Group Topic/Focus:  Goals Group:   The focus of this group is to help patients establish daily goals to achieve during treatment and discuss how the patient can incorporate goal setting into their daily lives to aide in recovery.    Participation Level:  Minimal  Participation Quality:  Attentive  Affect:  Flat  Cognitive:  Alert  Insight: Good  Engagement in Group:  Limited and Monopolizing  Modes of Intervention:  Discussion  Additional Comments:     Maglione,Shante Maysonet E 08/13/2024, 10:38 AM

## 2024-08-13 NOTE — Progress Notes (Signed)
" °   08/13/24 1400  Psych Admission Type (Psych Patients Only)  Admission Status Involuntary  Psychosocial Assessment  Patient Complaints Restlessness;Crying spells  Eye Contact Staring  Facial Expression Anxious  Affect Fearful;Labile  Speech Logical/coherent  Interaction Intrusive;Assertive  Motor Activity Other (Comment) (WNL)  Appearance/Hygiene Unremarkable  Behavior Characteristics Intrusive;Impulsive  Mood Labile;Preoccupied  Thought Process  Coherency Tangential  Content Preoccupation  Delusions Paranoid  Perception WDL  Hallucination None reported or observed  Judgment Impaired  Confusion Mild  Danger to Self  Current suicidal ideation?  (denies)  Danger to Others  Danger to Others None reported or observed    "

## 2024-08-14 DIAGNOSIS — F250 Schizoaffective disorder, bipolar type: Secondary | ICD-10-CM | POA: Diagnosis not present

## 2024-08-14 LAB — GLUCOSE, CAPILLARY: Glucose-Capillary: 88 mg/dL (ref 70–99)

## 2024-08-14 NOTE — Plan of Care (Signed)
" °  Problem: Activity: Goal: Sleeping patterns will improve Outcome: Progressing   Problem: Education: Goal: Emotional status will improve Outcome: Not Progressing Goal: Mental status will improve Outcome: Not Progressing Goal: Verbalization of understanding the information provided will improve Outcome: Not Progressing   Problem: Coping: Goal: Ability to demonstrate self-control will improve Outcome: Not Progressing   "

## 2024-08-14 NOTE — Group Note (Signed)
 Date:  08/14/2024 Time:  2:19 PM  Group Topic/Focus:  Coping With Mental Health Crisis:   The purpose of this group is to help patients identify strategies for coping with mental health crisis.  Group discusses possible causes of crisis and ways to manage them effectively. Managing Feelings:   The focus of this group is to identify what feelings patients have difficulty handling and develop a plan to handle them in a healthier way upon discharge.    Participation Level:  Active  Participation Quality:  Inattentive  Affect:  Irritable  Cognitive:  Lacking  Insight: Lacking  Engagement in Group:  Limited  Modes of Intervention:  Activity and Discussion  Additional Comments:    Idamae Coccia L Briseida Gittings 08/14/2024, 2:19 PM

## 2024-08-14 NOTE — Group Note (Signed)
 Physical/Occupational Therapy Group Note  Group Topic: Functional, Dynamic Balance   Group Date: 08/14/2024 Start Time: 1300 End Time: 1330 Facilitators: Keerthana Vanrossum, Alm Hamilton, PT   Group Description: Group discussed impact of balance on safety and independence with functional tasks.  Identified and discussed any self-perceived balance deficits to personalize information.  Discussed and reviewed strategies to address/improve balance deficits: use of assist devices, activity pacing/energy conservation, environment/home safety modifications, focusing attention/minimizing distraction.  Reviewed and participated with standing LE therex designed to target dynamic balance reactions and LE strength/stability; provided handouts with HEP to be utilized outside of group time as appropriate.  Allowed time for questions and further discussion on any balance or mobility concerns/needs.  Therapeutic Goal(s):  Identify and discuss any individual balance deficits and functional implications. Identify and discuss any environmental/home safety modifications that can optimize balance and safety for mobility within the home. Demonstrate understanding and performance of standing therex designed to target dynamic balance deficits.  Individual Participation: Pt was quietly observant during the discussion portion of the session and excused herself around the mid point of the session to return to her room with no reason provided.    Participation Level: Minimal   Participation Quality: Minimal Cues   Behavior: Appropriate   Speech/Thought Process: Quietly observant during the session    Affect/Mood: Flat   Insight: Unable to assess    Judgement: Unable to assess    Modes of Intervention: Activity, Discussion, and Education  Patient Response to Interventions:  Attentive   Plan: Continue to engage patient in PT/OT groups 1 - 2x/week.  CHARM Hamilton Bertin PT, DPT 08/14/24, 1:50 PM

## 2024-08-14 NOTE — Progress Notes (Signed)
 Pt encouragement provided for leg elevation due to worsening edema. LLE edema greater than RLE. Pt in dayroom resting in recliner with legs elevated on pillow. Education provided to pt on importance of elevating legs to help relieve some swelling. Pt compliant with nursing intervention.   Ellenore Roscoe S.,RN

## 2024-08-14 NOTE — Plan of Care (Signed)
  Problem: Activity: Goal: Sleeping patterns will improve Outcome: Progressing   

## 2024-08-14 NOTE — Group Note (Signed)
 Date:  08/14/2024 Time:  9:12 PM  Group Topic/Focus:  Wrap-Up Group:   The focus of this group is to help patients review their daily goal of treatment and discuss progress on daily workbooks.    Participation Level:  Active  Participation Quality:  Appropriate  Affect:  Appropriate  Cognitive:  Alert  Insight: Appropriate  Engagement in Group:  Engaged  Modes of Intervention:  Discussion  Additional Comments:    Valerie [name] 08/14/2024, 9:12 PM

## 2024-08-15 DIAGNOSIS — F250 Schizoaffective disorder, bipolar type: Secondary | ICD-10-CM | POA: Diagnosis not present

## 2024-08-15 LAB — GLUCOSE, CAPILLARY: Glucose-Capillary: 93 mg/dL (ref 70–99)

## 2024-08-15 NOTE — Group Note (Signed)
 Date:  08/15/2024 Time:  9:26 PM  Group Topic/Focus:  Wrap-Up Group:   The focus of this group is to help patients review their daily goal of treatment and discuss progress on daily workbooks.    Participation Level:  Active  Participation Quality:  Appropriate  Affect:  Appropriate  Cognitive:  Alert  Insight: Appropriate  Engagement in Group:  Engaged  Modes of Intervention:  Discussion  Additional Comments:    Valerie [name] Bunker 08/15/2024, 9:26 PM

## 2024-08-15 NOTE — Group Note (Signed)
 Date:  08/15/2024 Time:  4:35 PM  Group Topic/Focus:  Developing a Wellness Toolbox:   The focus of this group is to help patients develop a wellness toolbox with skills and strategies to promote recovery upon discharge.    Participation Level:  Active  Participation Quality:  Appropriate  Affect:  Appropriate  Cognitive:  Appropriate  Insight: Appropriate  Engagement in Group:  Engaged  Modes of Intervention:  Activity  Additional Comments:    Ko Bardon 08/15/2024, 4:35 PM

## 2024-08-15 NOTE — Plan of Care (Signed)
   Problem: Activity: Goal: Interest or engagement in activities will improve Outcome: Progressing Goal: Sleeping patterns will improve Outcome: Progressing

## 2024-08-15 NOTE — Plan of Care (Signed)
" °  Problem: Education: Goal: Knowledge of [facility] Education information/materials will improve Outcome: Progressing Goal: Emotional status will improve Outcome: Progressing Goal: Mental status will improve Outcome: Progressing Goal: Verbalization of understanding the information provided will improve Outcome: Progressing   Problem: Activity: Goal: Interest or engagement in activities will improve Outcome: Progressing Goal: Sleeping patterns will improve Outcome: Progressing   Problem: Coping: Goal: Ability to verbalize frustrations and anger appropriately will improve Outcome: Progressing Goal: Ability to demonstrate self-control will improve Outcome: Progressing   Problem: Health Behavior/Discharge Planning: Goal: Identification of resources available to assist in meeting health care needs will improve Outcome: Progressing Goal: Compliance with treatment plan for underlying cause of condition will improve Outcome: Progressing   Problem: Physical Regulation: Goal: Ability to maintain clinical measurements within normal limits will improve Outcome: Progressing   Problem: Safety: Goal: Periods of time without injury will increase Outcome: Progressing   Problem: Education: Goal: Ability to state activities that reduce stress will improve Outcome: Progressing   Problem: Self-Concept: Goal: Ability to identify factors that promote anxiety will improve Outcome: Progressing Goal: Level of anxiety will decrease Outcome: Progressing Goal: Ability to modify response to factors that promote anxiety will improve Outcome: Progressing   Problem: Activity: Goal: Will verbalize the importance of balancing activity with adequate rest periods Outcome: Progressing   "

## 2024-08-15 NOTE — Group Note (Signed)
 Recreation Therapy Group Note   Group Topic:Self-Esteem  Group Date: 08/15/2024 Start Time: 1400 End Time: 1440 Facilitators: Celestia Jeoffrey BRAVO, LRT, CTRS Location: Dayroom  Group Description: Positive Affirmation Bingo. LRT and patients played multiple games of Bingo with music playing in the background. LRT and pts discussed what a positive affirmation is, the importance of speaking kindly to yourself, and the use of this as a coping skill.  Goal Area(s) Addressed: Patient will learn positive affirmations.  Patient will engage in recreation activity.  Patient will increase communication.    Affect/Mood: Appropriate   Participation Level: Active and Engaged   Participation Quality: Independent   Behavior: Alert and Appropriate   Speech/Thought Process: Coherent   Insight: Fair   Judgement: Limited   Modes of Intervention: Competitive Play, Education, and Guided Discussion   Patient Response to Interventions:  Attentive, Engaged, Interested , and Receptive   Education Outcome:  Acknowledges education   Clinical Observations/Individualized Feedback: Kessler was active in their participation of session activities and group discussion. Pt interacted well with LRT and peers duration of session.    Plan: Continue to engage patient in RT group sessions 2-3x/week.   Jeoffrey BRAVO Celestia, LRT, CTRS 08/15/2024 3:47 PM

## 2024-08-15 NOTE — Progress Notes (Signed)
" °   08/14/24 2000  Psych Admission Type (Psych Patients Only)  Admission Status Involuntary  Psychosocial Assessment  Patient Complaints Agitation;Anxiety;Hyperactivity;Irritability  Eye Contact Glaring  Facial Expression Worried  Affect Labile  Speech Pressured  Interaction Assertive  Motor Activity Slow  Appearance/Hygiene Unremarkable  Behavior Characteristics Agitated;Anxious;Intrusive;Irritable  Mood Labile;Anxious;Angry;Preoccupied  Thought Process  Coherency Disorganized  Content Delusions  Delusions Paranoid  Perception WDL  Hallucination None reported or observed  Judgment Limited  Confusion Mild  Danger to Self  Current suicidal ideation? Denies  Agreement Not to Harm Self Yes  Description of Agreement verbal  Danger to Others  Danger to Others None reported or observed   Mood/Behavior:  Pt irritable, intrusive and anxious. Pt loud and yelling out rambling. Pt calm this morning.   Psych assessment: Denies SI/HI and AVH.     Interaction / Group attendance:  Present in the milieu. Interacting with peers and staff. Pt intrusive and loud tone. Pt yelling out be nice be nice.  Attended group. Pt seen attempting to ask peers to pray with her.   Medication/ PRNs: Compliant with scheduled medications. Required PRNs Atarax  for anxiety and noted effective. Pt required prn Maalox for indigestion noted effective.   Pain: Denies   15 min checks in place for safety "

## 2024-08-15 NOTE — Progress Notes (Signed)
" °   08/15/24 1200  Psych Admission Type (Psych Patients Only)  Admission Status Involuntary  Psychosocial Assessment  Patient Complaints Agitation;Hyperactivity;Irritability;Worrying  Eye Contact Glaring  Facial Expression Worried;Anxious  Affect Labile;Preoccupied  Speech Pressured;Loud  Interaction Assertive  Motor Activity Slow  Appearance/Hygiene In scrubs  Behavior Characteristics Agitated;Anxious;Irritable  Mood Anxious;Labile;Irritable;Preoccupied  Thought Process  Coherency Disorganized  Content Delusions  Delusions Paranoid  Perception WDL  Hallucination None reported or observed  Judgment Limited  Confusion Mild  Danger to Self  Current suicidal ideation? Denies  Danger to Others  Danger to Others None reported or observed    "

## 2024-08-16 DIAGNOSIS — F250 Schizoaffective disorder, bipolar type: Secondary | ICD-10-CM | POA: Diagnosis not present

## 2024-08-16 MED ORDER — ARIPIPRAZOLE 5 MG PO TABS
5.0000 mg | ORAL_TABLET | Freq: Every day | ORAL | Status: DC
  Administered 2024-08-17 – 2024-08-18 (×2): 5 mg via ORAL
  Filled 2024-08-16 (×2): qty 1

## 2024-08-16 NOTE — BH IP Treatment Plan (Signed)
 Interdisciplinary Treatment and Diagnostic Plan Update  08/16/2024 Time of Session: 10:30 AM  Valerie [name] MRN: [id number]  Principal Diagnosis: Bilateral lower extremity edema  Secondary Diagnoses: Principal Problem:   Bilateral lower extremity edema Active Problems:   Hypercholesteremia   Schizoaffective disorder, bipolar type (HCC)   Chest pain   Sinus tachycardia   Hypotension   Controlled type 2 diabetes mellitus without complication, without long-term current use of insulin (HCC)   Hyponatremia   Current Medications:  Current Facility-Administered Medications  Medication Dose Route Frequency Provider Last Rate Last Admin   acetaminophen  (TYLENOL ) tablet 650 mg  650 mg Oral Q6H PRN Onuoha, Chinwendu V, NP   650 mg at 08/14/24 1828   alum & mag hydroxide-simeth (MAALOX/MYLANTA) 200-200-20 MG/5ML suspension 30 mL  30 mL Oral Q8H PRN Ajibola, Ene A, NP   30 mL at 08/15/24 0446   ARIPiprazole  (ABILIFY ) tablet 30 mg  30 mg Oral Daily Jadapalle, Sree, MD   30 mg at 08/16/24 9046   aspirin  EC tablet 81 mg  81 mg Oral Daily Bobbitt, Shalon E, NP   81 mg at 08/16/24 9046   benztropine  (COGENTIN ) tablet 0.5 mg  0.5 mg Oral Daily PRN Jadapalle, Sree, MD       cholecalciferol  (VITAMIN D3) 25 MCG (1000 UNIT) tablet 1,000 Units  1,000 Units Oral Weekly Jadapalle, Sree, MD   1,000 Units at 08/15/24 1223   cloZAPine  (CLOZARIL ) tablet 150 mg  150 mg Oral BID Jadapalle, Sree, MD   150 mg at 08/16/24 9046   Or   OLANZapine  (ZYPREXA ) injection 10 mg  10 mg Intramuscular BID Jadapalle, Sree, MD       dapagliflozin  propanediol (FARXIGA ) tablet 10 mg  10 mg Oral Daily Josette Ade, MD   10 mg at 08/16/24 9044   diclofenac  Sodium (VOLTAREN ) 1 % topical gel 4 g  4 g Topical QID PRN Madaram, Kondal R, MD       famotidine  (PEPCID ) tablet 40 mg  40 mg Oral Daily Shrivastava, Aryendra, MD   40 mg at 08/16/24 0955   fluticasone  (FLONASE ) 50 MCG/ACT nasal spray 1 spray  1 spray Each Nare Daily PRN  Madaram, Kondal R, MD       furosemide  (LASIX ) tablet 40 mg  40 mg Oral 2 times per day Josette Ade, MD   40 mg at 08/16/24 9046   gabapentin  (NEURONTIN ) capsule 300 mg  300 mg Oral TID Bobbitt, Shalon E, NP   300 mg at 08/16/24 0954   hydrOXYzine  (ATARAX ) tablet 25 mg  25 mg Oral TID PRN Bobbitt, Shalon E, NP   25 mg at 08/15/24 2132   magnesium  hydroxide (MILK OF MAGNESIA) suspension 30 mL  30 mL Oral Daily PRN Bobbitt, Shalon E, NP   30 mL at 08/06/24 1047   metoprolol  succinate (TOPROL -XL) 24 hr tablet 25 mg  25 mg Oral BID Josette Ade, MD   25 mg at 08/16/24 0959   multivitamin with minerals tablet 1 tablet  1 tablet Oral Daily Bobbitt, Shalon E, NP   1 tablet at 08/16/24 9046   naproxen  (NAPROSYN ) tablet 375 mg  375 mg Oral TID WC Jadapalle, Sree, MD   375 mg at 08/16/24 0954   OLANZapine  (ZYPREXA ) injection 5 mg  5 mg Intramuscular TID PRN Bobbitt, Shalon E, NP   5 mg at 06/22/24 2259   OLANZapine  zydis (ZYPREXA ) disintegrating tablet 5 mg  5 mg Oral TID PRN Bobbitt, Shalon E, NP   5 mg at  08/15/24 1034   ondansetron  (ZOFRAN ) tablet 4 mg  4 mg Oral Q8H PRN Bobbitt, Shalon E, NP   4 mg at 08/16/24 0518   Oxcarbazepine  (TRILEPTAL ) tablet 300 mg  300 mg Oral BID Jadapalle, Sree, MD   300 mg at 08/16/24 9046   potassium chloride  SA (KLOR-CON  M) CR tablet 20 mEq  20 mEq Oral BID Bobbitt, Shalon E, NP   20 mEq at 08/16/24 9046   rosuvastatin  (CRESTOR ) tablet 5 mg  5 mg Oral Daily Bobbitt, Shalon E, NP   5 mg at 08/16/24 9045   traZODone  (DESYREL ) tablet 50 mg  50 mg Oral QHS PRN Bobbitt, Shalon E, NP   50 mg at 08/13/24 2114   zolpidem  (AMBIEN ) tablet 10 mg  10 mg Oral QHS Jadapalle, Sree, MD   10 mg at 08/15/24 2133   PTA Medications: Medications Prior to Admission  Medication Sig Dispense Refill Last Dose/Taking   acetaminophen  (TYLENOL ) 650 MG CR tablet Take 650 mg by mouth every 8 (eight) hours as needed for pain.      benztropine  (COGENTIN ) 0.5 MG tablet Take by mouth.       bisacodyl  5 MG EC tablet As directed for procedure 8 tablet 0    Cholecalciferol  (VITAMIN D ) 125 MCG (5000 UT) CAPS Take 1,000 Units by mouth once a week.      cloZAPine  (CLOZARIL ) 100 MG tablet Take 200 mg by mouth 2 (two) times daily.      DULoxetine  (CYMBALTA ) 20 MG capsule Take 20 mg by mouth daily.      fluticasone  (FLONASE ) 50 MCG/ACT nasal spray Place 1 spray into both nostrils daily.      gabapentin  (NEURONTIN ) 300 MG capsule Take 300 mg by mouth 3 (three) times daily.      haloperidol  (HALDOL ) 0.5 MG tablet Take 0.5 mg by mouth 2 (two) times daily.      hydrochlorothiazide  (HYDRODIURIL ) 25 MG tablet Take 25 mg by mouth daily.      lisinopril  (ZESTRIL ) 30 MG tablet Take 30 mg by mouth daily.      loperamide (IMODIUM) 2 MG capsule Take 2 mg by mouth as needed for diarrhea or loose stools. Take 1 capsule by mouth as needed with each loose stool/diarrhea nte 8 doses in 24 hr      Multiple Vitamin (DAILY VITE PO) Take 1 tablet by mouth daily.      naproxen  (NAPROSYN ) 500 MG tablet Take 500 mg by mouth every 12 (twelve) hours as needed.      omeprazole (PRILOSEC) 20 MG capsule Take 20 mg by mouth daily.      ondansetron  (ZOFRAN ) 4 MG tablet Take 4 mg by mouth every 8 (eight) hours as needed for nausea or vomiting.      potassium chloride  SA (KLOR-CON  M) 20 MEQ tablet Take 20 mEq by mouth 2 (two) times daily.      propranolol  (INDERAL ) 40 MG tablet Take 40 mg by mouth 2 (two) times daily.      rosuvastatin  (CRESTOR ) 5 MG tablet Take 5 mg by mouth daily.      sodium phosphate  (FLEET) ENEM As directed for procedure 133 mL 0    triamcinolone ointment (KENALOG) 0.1 % Apply topically.       Patient Stressors: Medication change or noncompliance    Patient Strengths: Ability for insight   Treatment Modalities: Medication Management, Group therapy, Case management,  1 to 1 session with clinician, Psychoeducation, Recreational therapy.   Physician Treatment Plan for Primary  Diagnosis: Bilateral  lower extremity edema Long Term Goal(s): Improvement in symptoms so as ready for discharge   Short Term Goals: Ability to identify changes in lifestyle to reduce recurrence of condition will improve Ability to verbalize feelings will improve Ability to disclose and discuss suicidal ideas Ability to demonstrate self-control will improve Ability to identify and develop effective coping behaviors will improve  Medication Management: Evaluate patient's response, side effects, and tolerance of medication regimen.  Therapeutic Interventions: 1 to 1 sessions, Unit Group sessions and Medication administration.  Evaluation of Outcomes: Progressing  Physician Treatment Plan for Secondary Diagnosis: Principal Problem:   Bilateral lower extremity edema Active Problems:   Hypercholesteremia   Schizoaffective disorder, bipolar type (HCC)   Chest pain   Sinus tachycardia   Hypotension   Controlled type 2 diabetes mellitus without complication, without long-term current use of insulin (HCC)   Hyponatremia  Long Term Goal(s): Improvement in symptoms so as ready for discharge   Short Term Goals: Ability to identify changes in lifestyle to reduce recurrence of condition will improve Ability to verbalize feelings will improve Ability to disclose and discuss suicidal ideas Ability to demonstrate self-control will improve Ability to identify and develop effective coping behaviors will improve     Medication Management: Evaluate patient's response, side effects, and tolerance of medication regimen.  Therapeutic Interventions: 1 to 1 sessions, Unit Group sessions and Medication administration.  Evaluation of Outcomes: Progressing   RN Treatment Plan for Primary Diagnosis: Bilateral lower extremity edema Long Term Goal(s): Knowledge of disease and therapeutic regimen to maintain health will improve  Short Term Goals: Ability to remain free from injury will improve, Ability to verbalize frustration  and anger appropriately will improve, Ability to demonstrate self-control, Ability to participate in decision making will improve, Ability to verbalize feelings will improve, Ability to disclose and discuss suicidal ideas, Ability to identify and develop effective coping behaviors will improve, and Compliance with prescribed medications will improve  Medication Management: RN will administer medications as ordered by provider, will assess and evaluate patient's response and provide education to patient for prescribed medication. RN will report any adverse and/or side effects to prescribing provider.  Therapeutic Interventions: 1 on 1 counseling sessions, Psychoeducation, Medication administration, Evaluate responses to treatment, Monitor vital signs and CBGs as ordered, Perform/monitor CIWA, COWS, AIMS and Fall Risk screenings as ordered, Perform wound care treatments as ordered.  Evaluation of Outcomes: Progressing   LCSW Treatment Plan for Primary Diagnosis: Bilateral lower extremity edema Long Term Goal(s): Safe transition to appropriate next level of care at discharge, Engage patient in therapeutic group addressing interpersonal concerns.  Short Term Goals: Engage patient in aftercare planning with referrals and resources, Increase social support, Increase ability to appropriately verbalize feelings, Increase emotional regulation, Facilitate acceptance of mental health diagnosis and concerns, Facilitate patient progression through stages of change regarding substance use diagnoses and concerns, Identify triggers associated with mental health/substance abuse issues, and Increase skills for wellness and recovery  Therapeutic Interventions: Assess for all discharge needs, 1 to 1 time with Social worker, Explore available resources and support systems, Assess for adequacy in community support network, Educate family and significant other(s) on suicide prevention, Complete Psychosocial Assessment,  Interpersonal group therapy.  Evaluation of Outcomes: Progressing   Progress in Treatment: Attending groups: Yes. and No. Participating in groups: Yes. and No.  Taking medication as prescribed: Yes. Toleration medication: Yes. Family/Significant other contact made: Yes, individual(s) contacted:  Yes, pt's sister and mother  Patient understands diagnosis: Yes and  No. Discussing patient identified problems/goals with staff: Yes. Medical problems stabilized or resolved: Yes. Denies suicidal/homicidal ideation: Yes. Issues/concerns per patient self-inventory: Yes. Other: none   New problem(s) identified: No, Describe:  none   New Short Term/Long Term Goal(s):elimination of symptoms of psychosis, medication management for mood stabilization; elimination of SI thoughts; development of comprehensive mental wellness plan. 06/29/24 Update: No changes at this time. Update 07/04/24: No changes at this time Update 07/10/24: No changes at this time Update 07/10/24: No changes at this time 07/15/24 no changes Update 07/20/24: No changes at this time 07/15/24 no changes Update 07/25/24: No changes at this time Update 07/30/24: No changes at this time. Update 08/04/24: No changes at this time. Update 08/11/24: No changes at this time.  Update 08/16/24: No changes at this time.       Patient Goals:    To continue to get well 06/29/24 Update: No changes at this time. Update 07/04/24: No changes at this time Update 07/10/24: No changes at this time Update 07/10/24: No changes at this time 07/15/24 no changes Update 07/20/24: No changes at this time 07/15/24 no changes Update 07/25/24: No changes at this time: Update 07/30/24: No changes at this time.  Update 08/04/24: No changes at this time. Update 08/11/24: No changes at this time. Update 08/16/24: No changes at this time.     Discharge Plan or Barriers: CSW will assist with appropriate discharge planning 06/29/24 Update:  CSW has made APS report to [facility]  APs following safety concerns. CSW to continue to coordinate with DSS worker to continue to assess. Update 07/04/24: No changes at this time Update 07/10/24: No changes at this time Update 07/10/24: DSS reports no changes at this time, DSS unsure if pt's family has filed for the guardianship. Caseworker Adams Louder reports she will check in with CSW after New Years 07/15/2024 no changes Update 07/20/24: Pt's family filed for guardianship, court date scheduled for 08/25/23 Update 07/25/24: No changes at this time. Update 07/30/24: No changes at this time.  Update 08/04/24: No changes at this time. Update 08/11/24: Pt's family currently working towards placement and guardianship Update 08/16/24: No changes at this time.     Reason for Continuation of Hospitalization:  Medication stabilization    Estimated Length of Stay: TBD 07/15/24 Update 07/20/24: TBD Update 07/25/24: TBD Update 07/30/24: TBD  Update 08/04/24: TBD Update 08/11/24: TBDUpdate 08/16/24: TBD  Last 3 [place] Suicide Severity Risk Score: Flowsheet Row Admission (Current) from 06/19/2024 in [facility] from 06/18/2024 in [facility] Emergency Department at [facility] ED from 05/24/2022 in [facility] Emergency Department at [facility]  C-SSRS RISK CATEGORY No Risk No Risk No Risk    Last PHQ 2/9 Scores:    12/21/2022    9:00 AM  Depression screen PHQ 2/9  Decreased Interest 0  Down, Depressed, Hopeless 0  PHQ - 2 Score 0    Scribe for Treatment Team: Lum JONETTA Croft, ISRA[place] 08/16/2024 2:28 PM

## 2024-08-16 NOTE — Plan of Care (Signed)
  Problem: Activity: Goal: Sleeping patterns will improve Outcome: Progressing   Problem: Health Behavior/Discharge Planning: Goal: Compliance with treatment plan for underlying cause of condition will improve Outcome: Progressing   Problem: Safety: Goal: Periods of time without injury will increase Outcome: Progressing   

## 2024-08-16 NOTE — Progress Notes (Signed)
" °   08/16/24 1100  Psych Admission Type (Psych Patients Only)  Admission Status Involuntary  Psychosocial Assessment  Patient Complaints Anxiety  Eye Contact Glaring  Facial Expression Worried;Anxious  Affect Labile;Preoccupied  Speech Pressured;Loud  Interaction Assertive  Motor Activity Slow  Appearance/Hygiene In scrubs  Behavior Characteristics Intrusive  Mood Anxious  Thought Process  Coherency Disorganized  Content Delusions  Delusions Paranoid  Perception WDL  Hallucination None reported or observed  Judgment Limited  Confusion Mild  Danger to Self  Current suicidal ideation? Denies  Danger to Others  Danger to Others None reported or observed    "

## 2024-08-16 NOTE — Group Note (Signed)
 [facility] LCSW Group Therapy Note   Group Date: 08/15/2024 Start Time: 1330 End Time: 1345  Type of Therapy/Topic:  Group Therapy:  Feelings about Diagnosis  Participation Level:  Active   Mood: Labile   Description of Group:    This group will allow patients to explore their thoughts and feelings about diagnoses they have received. Patients will be guided to explore their level of understanding and acceptance of these diagnoses. Facilitator will encourage patients to process their thoughts and feelings about the reactions of others to their diagnosis, and will guide patients in identifying ways to discuss their diagnosis with significant others in their lives. This group will be process-oriented, with patients participating in exploration of their own experiences as well as giving and receiving support and challenge from other group members.   Therapeutic Goals: 1. Patient will demonstrate understanding of diagnosis as evidence by identifying two or more symptoms of the disorder:  2. Patient will be able to express two feelings regarding the diagnosis 3. Patient will demonstrate ability to communicate their needs through discussion and/or role plays  Summary of Patient Progress:    Pt was active throughout group. Pt was labile with emotions ranging from anger/upset to content/happy. Pt able to identify feelings about discharge but not open to feedback from peers. Pt led group out with a song.    Therapeutic Modalities:   Cognitive Behavioral Therapy Brief Therapy Feelings Identification    Valerie [name], LCSWA

## 2024-08-16 NOTE — Progress Notes (Signed)
 [facility] MD Progress Note   12:46 PM Valerie [name]  MRN:  [id number]   Valerie [name] is a 64 year old female presenting voluntary to [facility] requesting a psychiatric evaluation for worsening psychosis. Patient has history of schizophrenia. Patient denied SI, HI, psychosis and alcohol/drug usage. Patient resides at [facility]. Patient reports people are coming by my door talking loud, yelling and screaming and coming in my room with keys. Patient reports having multiple medical Afibs and blood clots due to this. Patient reports people are doing things in the dining room. When asked if she called EMS, patient stated I called EMS, I stripped searched over the phone and called the authorities. Patient is admitted to [facility] unit with Q15 min safety monitoring. Multidisciplinary team approach is offered. Medication management; group/milieu therapy is offered.   Sister Dorette Hartel : [phone]- called on 07/28/24  to update the med changes but went to generic voicemail  Subjective:  Chart reviewed, case discussed in multidisciplinary meeting, patient seen during rounds.   Patient is noted to be sitting up in the day area.  She remains fixated on the director of the new placement coming in assisting her.  She reports that her family has looked for this assisted living facility and she is willing to go there.  Patient offers no other complaints.  Patient is taking her medications and denies having any side effects.  Patient denies SI/HI/plan.  Per nursing patient is redirectable. Past Psychiatric History: see h&P Family History:  Family History  Problem Relation Age of Onset   Colon cancer Neg Hx    Celiac disease Neg Hx    Inflammatory bowel disease Neg Hx    Social History:  Social History   Substance and Sexual Activity  Alcohol Use Never     Social History   Substance and Sexual Activity  Drug Use Never    Social History   Socioeconomic History    Marital status: Single    Spouse name: Not on file   Number of children: Not on file   Years of education: Not on file   Highest education level: Not on file  Occupational History   Not on file  Tobacco Use   Smoking status: Never   Smokeless tobacco: Never  Substance and Sexual Activity   Alcohol use: Never   Drug use: Never   Sexual activity: Not Currently    Birth control/protection: Pill  Other Topics Concern   Not on file  Social History Narrative   Not on file   Social Drivers of Health   Tobacco Use: Low Risk (06/19/2024)   Patient History    Smoking Tobacco Use: Never    Smokeless Tobacco Use: Never    Passive Exposure: Not on file  Financial Resource Strain: Not on file  Food Insecurity: Unknown (06/19/2024)   Epic    Worried About Programme Researcher, Broadcasting/film/video in the Last Year: Never true    [employer] of Food in the Last Year: Patient declined  Transportation Needs: No Transportation Needs (06/19/2024)   Epic    Lack of Transportation (Medical): No    Lack of Transportation (Non-Medical): No  Physical Activity: Not on file  Stress: Not on file  Social Connections: Not on file  Depression (PHQ2-9): Low Risk (12/21/2022)   Depression (PHQ2-9)    PHQ-2 Score: 0  Alcohol Screen: Low Risk (06/19/2024)   Alcohol Screen    Last Alcohol Screening Score (AUDIT): 0  Housing: Low  Risk (06/19/2024)   Epic    Unable to Pay for Housing in the Last Year: No    Number of Times Moved in the Last Year: 0    Homeless in the Last Year: No  Utilities: Not At Risk (06/19/2024)   Epic    Threatened with loss of utilities: No  Health Literacy: Not on file   Past Medical History:  Past Medical History:  Diagnosis Date   Chronic lower extremity pain (2ry area of Pain) (Right) 12/21/2022   Chronic pain syndrome 12/20/2022   DDD (degenerative disc disease), lumbosacral 12/21/2022   HTN (hypertension)    Hypercholesteremia    Prediabetes    Schizophrenia (HCC)     Past Surgical History:   Procedure Laterality Date   COLONOSCOPY  2014   Dr. Tobie: Pancolonic diverticulosis, tortuous colon, next colonoscopy 2024   TONSILLECTOMY      Current Medications: Current Facility-Administered Medications  Medication Dose Route Frequency Provider Last Rate Last Admin   acetaminophen  (TYLENOL ) tablet 650 mg  650 mg Oral Q6H PRN Onuoha, Chinwendu V, NP   650 mg at 08/14/24 1828   alum & mag hydroxide-simeth (MAALOX/MYLANTA) 200-200-20 MG/5ML suspension 30 mL  30 mL Oral Q8H PRN Ajibola, Ene A, NP   30 mL at 08/15/24 0446   ARIPiprazole  (ABILIFY ) tablet 30 mg  30 mg Oral Daily Jacquita Mulhearn, MD   30 mg at 08/16/24 9046   aspirin  EC tablet 81 mg  81 mg Oral Daily Bobbitt, Shalon E, NP   81 mg at 08/16/24 9046   benztropine  (COGENTIN ) tablet 0.5 mg  0.5 mg Oral Daily PRN Cristin Szatkowski, MD       cholecalciferol  (VITAMIN D3) 25 MCG (1000 UNIT) tablet 1,000 Units  1,000 Units Oral Weekly Waino Mounsey, MD   1,000 Units at 08/15/24 1223   cloZAPine  (CLOZARIL ) tablet 150 mg  150 mg Oral BID Athena Baltz, MD   150 mg at 08/16/24 9046   Or   OLANZapine  (ZYPREXA ) injection 10 mg  10 mg Intramuscular BID Kacey Vicuna, MD       dapagliflozin  propanediol (FARXIGA ) tablet 10 mg  10 mg Oral Daily Josette Ade, MD   10 mg at 08/16/24 9044   diclofenac  Sodium (VOLTAREN ) 1 % topical gel 4 g  4 g Topical QID PRN Madaram, Kondal R, MD       famotidine  (PEPCID ) tablet 40 mg  40 mg Oral Daily Shrivastava, Aryendra, MD   40 mg at 08/16/24 0955   fluticasone  (FLONASE ) 50 MCG/ACT nasal spray 1 spray  1 spray Each Nare Daily PRN Madaram, Kondal R, MD       furosemide  (LASIX ) tablet 40 mg  40 mg Oral 2 times per day Josette Ade, MD   40 mg at 08/16/24 9046   gabapentin  (NEURONTIN ) capsule 300 mg  300 mg Oral TID Bobbitt, Shalon E, NP   300 mg at 08/16/24 0954   hydrOXYzine  (ATARAX ) tablet 25 mg  25 mg Oral TID PRN Bobbitt, Shalon E, NP   25 mg at 08/15/24 2132   magnesium  hydroxide (MILK OF  MAGNESIA) suspension 30 mL  30 mL Oral Daily PRN Bobbitt, Shalon E, NP   30 mL at 08/06/24 1047   metoprolol  succinate (TOPROL -XL) 24 hr tablet 25 mg  25 mg Oral BID Josette Ade, MD   25 mg at 08/16/24 0959   multivitamin with minerals tablet 1 tablet  1 tablet Oral Daily Bobbitt, Shalon E, NP   1 tablet at  08/16/24 0953   naproxen  (NAPROSYN ) tablet 375 mg  375 mg Oral TID WC Telsa Dillavou, MD   375 mg at 08/16/24 0954   OLANZapine  (ZYPREXA ) injection 5 mg  5 mg Intramuscular TID PRN Bobbitt, Shalon E, NP   5 mg at 06/22/24 2259   OLANZapine  zydis (ZYPREXA ) disintegrating tablet 5 mg  5 mg Oral TID PRN Bobbitt, Shalon E, NP   5 mg at 08/15/24 1034   ondansetron  (ZOFRAN ) tablet 4 mg  4 mg Oral Q8H PRN Bobbitt, Shalon E, NP   4 mg at 08/16/24 0518   Oxcarbazepine  (TRILEPTAL ) tablet 300 mg  300 mg Oral BID Hadasah Brugger, MD   300 mg at 08/16/24 9046   potassium chloride  SA (KLOR-CON  M) CR tablet 20 mEq  20 mEq Oral BID Bobbitt, Shalon E, NP   20 mEq at 08/16/24 0953   rosuvastatin  (CRESTOR ) tablet 5 mg  5 mg Oral Daily Bobbitt, Shalon E, NP   5 mg at 08/16/24 0954   traZODone  (DESYREL ) tablet 50 mg  50 mg Oral QHS PRN Bobbitt, Shalon E, NP   50 mg at 08/13/24 2114   zolpidem  (AMBIEN ) tablet 10 mg  10 mg Oral QHS Cullin Dishman, MD   10 mg at 08/15/24 2133    Lab Results:  Results for orders placed or performed during the hospital encounter of 06/19/24 (from the past 48 hours)  Glucose, capillary     Status: None   Collection Time: 08/15/24  5:56 AM  Result Value Ref Range   Glucose-Capillary 93 70 - 99 mg/dL    Comment: Glucose reference range applies only to samples taken after fasting for at least 8 hours.        Blood Alcohol level:  Lab Results  Component Value Date   [facility] <15 06/18/2024    Metabolic Disorder Labs: Lab Results  Component Value Date   HGBA1C 5.9 (H) 06/28/2024   MPG 122.63 06/28/2024   No results found for: PROLACTIN Lab Results  Component Value  Date   CHOL 156 06/28/2024   TRIG 89 06/28/2024   HDL 70 06/28/2024   CHOLHDL 2.2 06/28/2024   VLDL 18 06/28/2024   LDLCALC 69 06/28/2024    Physical Findings: AIMS:  , ,  ,  ,    CIWA:    COWS:      Psychiatric Specialty Exam:  Presentation  General Appearance:  Casual  Eye Contact: Fleeting  Speech: Normal Rate  Speech Volume: Increased    Mood and Affect  Mood: Euphoric  Affect: stable  Thought Process  Thought Processes: Improving illogical  Orientation:Full (Time, Place and Person)  Thought Content:Illogical; Delusions; Tangential  Hallucinations: Denies  Ideas of Reference:Delusions  Suicidal Thoughts: Denies  Homicidal Thoughts: Denies   Sensorium  Memory: Immediate Fair; Remote Fair  Judgment: Impaired  Insight: Shallow   Executive Functions  Concentration: Poor  Attention Span: Poor  Recall: [employer] of Knowledge: Fair  Language: Fair   Psychomotor Activity  Psychomotor Activity: No data recorded  Musculoskeletal: Strength & Muscle Tone: within normal limits Gait & Station: normal Assets  Assets: Manufacturing Systems Engineer; Desire for Improvement    Physical Exam: Physical Exam Vitals and nursing note reviewed.    ROS Blood pressure (!) 116/45, pulse 65, temperature (!) 97.4 F (36.3 C), resp. rate 14, height 5' 5 (1.651 m), weight 87.3 kg, SpO2 100%. Body mass index is 32.03 kg/m.  Diagnosis: Principal Problem:   Bilateral lower extremity edema Active Problems:  Hypercholesteremia   Schizoaffective disorder, bipolar type (HCC)   Chest pain   Sinus tachycardia   Hypotension   Controlled type 2 diabetes mellitus without complication, without long-term current use of insulin (HCC)   Hyponatremia  Schizoaffective disorder bipolar type  Clinical Decision Making: Patient is currently admitted for worsening psychosis, manic symptoms, tangential grandiosity.  She needs inpatient hospitalization for  medication management and close monitoring.  Patient is unable to make decisions on her own as she is unable to appreciate and understand her mental health history, the current need for treatment.  [facility] has screened her case and for further evaluation for legal guardianship. [facility] screened in her case. Sister and mom are involved in her care.  Treatment Plan Summary:  Patient is not responding at all to Clozaril  in spite of increased dose and also poor response to Geodon  which was discontinued and started on Abilify  which was titrated to 20 mg today.  If patient remains manic/psychotic we will discuss a plan to switch Clozaril  to loxapine as patient has documented allergy to Risperdal/paliperidone. Safety and Monitoring:             -- inoluntary admission to inpatient psychiatric unit for safety, stabilization and treatment             -- Daily contact with patient to assess and evaluate symptoms and progress in treatment             -- Patient's case to be discussed in multi-disciplinary team meeting             -- Observation Level: q15 minute checks             -- Vital signs:  q12 hours             -- Precautions: suicide, elopement, and assault   2. Psychiatric Diagnoses and Treatment:  Discontinue Depakote  as patient is noncompliant Discontinue Geodon  20 mg nightly as poor response Clozaril  150mg  twice daily  Cymbalta  40 mg daily-discontinue to minimize manic Trilepta 300 mg twice daily  LAI Abilify  Maintena- given 08/04/24 -- The risks/benefits/side-effects/alternatives to this medication were discussed in detail with the patient and time was given for questions. The patient consents to medication trial.                -- Metabolic profile and EKG monitoring obtained while on an atypical antipsychotic (BMI: Lipid Panel: HbgA1c: QTc:)              -- Encouraged patient to participate in unit milieu and in scheduled group therapies                            3. Medical Issues Being  Addressed:   Chest pain Sinus tachycardia HTN (hypertension) Bilateral lower extremity edema Hypercholesteremia  12/30:  Troponin negative x 2. Echocardiogram done with pending results.  Small improvement in lower extremity edema so continuing p.o. Lasix .  Will need BMP every other day.  4. Discharge Planning:   -- Social work and case management to assist with discharge planning and identification of hospital follow-up needs prior to discharge  -- Estimated LOS: 3-4 days  Neko Mcgeehan, MD 08/16/2024, 12:46 PM

## 2024-08-16 NOTE — Group Note (Signed)
 Date:  08/16/2024 Time:  9:45 AM  Group Topic/Focus:  Movement Therapy, Morning stretch with Laurel Smeltz.    Participation Level:  Active  Participation Quality:  Appropriate  Affect:  Appropriate  Cognitive:  Appropriate  Insight: Appropriate  Engagement in Group:  Engaged  Modes of Intervention:  Activity  Additional Comments:  none  [name] 08/16/2024, 9:45 AM

## 2024-08-16 NOTE — Plan of Care (Signed)
   Problem: Coping: Goal: Ability to verbalize frustrations and anger appropriately will improve Outcome: Progressing Goal: Ability to demonstrate self-control will improve Outcome: Progressing

## 2024-08-16 NOTE — Progress Notes (Signed)
" °   08/16/24 2000  Psych Admission Type (Psych Patients Only)  Admission Status Involuntary  Psychosocial Assessment  Patient Complaints Anxiety;Restlessness  Eye Contact Glaring  Facial Expression Anxious  Affect Labile  Speech Logical/coherent  Interaction Assertive  Motor Activity Slow  Appearance/Hygiene In scrubs  Behavior Characteristics Intrusive;Restless  Mood Preoccupied;Anxious  Thought Process  Coherency Disorganized  Content Delusions  Delusions Paranoid  Perception WDL  Hallucination None reported or observed  Judgment Limited  Confusion Mild  Danger to Self  Current suicidal ideation? Denies  Danger to Others  Danger to Others None reported or observed    "

## 2024-08-16 NOTE — Group Note (Signed)
 Date:  08/16/2024 Time:  9:21 PM  Group Topic/Focus:  Wrap-Up Group:   The focus of this group is to help patients review their daily goal of treatment and discuss progress on daily workbooks.    Participation Level:  Active  Participation Quality:  Appropriate  Affect:  Appropriate  Cognitive:  Alert  Insight: Appropriate  Engagement in Group:  Engaged  Modes of Intervention:  Discussion  Additional Comments:    Sherrilyn JAYSON Redman 08/16/2024, 9:21 PM

## 2024-08-17 DIAGNOSIS — F250 Schizoaffective disorder, bipolar type: Secondary | ICD-10-CM | POA: Diagnosis not present

## 2024-08-17 LAB — CBC WITH DIFFERENTIAL/PLATELET
Abs Immature Granulocytes: 0.03 10*3/uL (ref 0.00–0.07)
Basophils Absolute: 0.1 10*3/uL (ref 0.0–0.1)
Basophils Relative: 1 %
Eosinophils Absolute: 0.3 10*3/uL (ref 0.0–0.5)
Eosinophils Relative: 5 %
HCT: 37.4 % (ref 36.0–46.0)
Hemoglobin: 11.6 g/dL — ABNORMAL LOW (ref 12.0–15.0)
Immature Granulocytes: 0 %
Lymphocytes Relative: 38 %
Lymphs Abs: 2.7 10*3/uL (ref 0.7–4.0)
MCH: 26.7 pg (ref 26.0–34.0)
MCHC: 31 g/dL (ref 30.0–36.0)
MCV: 86.2 fL (ref 80.0–100.0)
Monocytes Absolute: 0.7 10*3/uL (ref 0.1–1.0)
Monocytes Relative: 10 %
Neutro Abs: 3.2 10*3/uL (ref 1.7–7.7)
Neutrophils Relative %: 46 %
Platelets: 323 10*3/uL (ref 150–400)
RBC: 4.34 MIL/uL (ref 3.87–5.11)
RDW: 13.9 % (ref 11.5–15.5)
WBC: 7 10*3/uL (ref 4.0–10.5)
nRBC: 0 % (ref 0.0–0.2)

## 2024-08-17 NOTE — Progress Notes (Signed)
 SPIRITUAL CARE AND COUNSELING CONSULT NOTE   VISIT SUMMARY Follow up visit for spiritual/emotional support while rounding on unit. Chaplain consulted with nurse team.  SPIRITUAL ENCOUNTER                                                                                                                                                                      Type of Visit: Follow up Care provided to:: Patient Conversation partners present during encounter: Nurse Referral source: Patient request Reason for visit: Routine spiritual support OnCall Visit: No   SPIRITUAL FRAMEWORK  Presenting Themes: Goals in life/care, Values and beliefs Community/Connection: Family Patient Stress Factors: Health changes Family Stress Factors: None identified   GOALS   Self/Personal Goals: healing Clinical Care Goals: healing   INTERVENTIONS   Spiritual Care Interventions Made: Compassionate presence, Encouragement, Explored values/beliefs/practices/strengths, Explored ethical dilemma (Was excited about being able to receive visit from family for mother's birthday in following week. Encouraged.)    INTERVENTION OUTCOMES   Outcomes: Reduced isolation, Awareness of support, Awareness around self/spiritual resourses  Chaplain provided compassionate presence, reflective listening, and open ended questions to elicit Valerie [name]'s feelings about health status, treatment plan, excitement about team supporting family visit request, and desired outcomes.   SPIRITUAL CARE PLAN   Spiritual Care Issues Still Outstanding: Chaplain will continue to follow    If immediate needs arise, please contact [facility] 24 hour on call [phone]   Barabara Chess, Chaplain  08/17/2024 1:53 PM

## 2024-08-17 NOTE — Plan of Care (Signed)
  Problem: Activity: Goal: Interest or engagement in activities will improve Outcome: Progressing Goal: Sleeping patterns will improve Outcome: Progressing   Problem: Safety: Goal: Periods of time without injury will increase Outcome: Progressing

## 2024-08-17 NOTE — Group Note (Signed)
 LCSW Group Therapy Note  Group Date: 08/17/2024 Start Time: 1315 End Time: 1345   Type of Therapy and Topic:  Group Therapy - Healthy vs Unhealthy Coping Skills  Participation Level:  Active   Description of Group The focus of this group was to determine what unhealthy coping techniques typically are used by group members and what healthy coping techniques would be helpful in coping with various problems. Patients were guided in becoming aware of the differences between healthy and unhealthy coping techniques. Patients were asked to identify 2-3 healthy coping skills they would like to learn to use more effectively.  Therapeutic Goals Patients learned that coping is what human beings do all day long to deal with various situations in their lives Patients defined and discussed healthy vs unhealthy coping techniques Patients identified their preferred coping techniques and identified whether these were healthy or unhealthy Patients determined 2-3 healthy coping skills they would like to become more familiar with and use more often. Patients provided support and ideas to each other   Summary of Patient Progress:   Patient proved open to input from peers and feedback from CSW. Patient demonstrated fair insight into the subject matter, was respectful of peers, and participated throughout the entire session.   Therapeutic Modalities Cognitive Behavioral Therapy Motivational Interviewing  Lum JONETTA Croft, LCSWA 08/17/2024  2:15 PM

## 2024-08-17 NOTE — Group Note (Signed)
 Date:  08/17/2024 Time:  8:39 PM  Group Topic/Focus:  Making Healthy Choices:   The focus of this group is to help patients identify negative/unhealthy choices they were using prior to admission and identify positive/healthier coping strategies to replace them upon discharge.    Participation Level:  Active  Participation Quality:  Appropriate  Affect:  Appropriate  Cognitive:  Appropriate  Insight: Good  Engagement in Group:  Engaged  Modes of Intervention:  Discussion  Additional Comments:    Valerie [name] 08/17/2024, 8:39 PM

## 2024-08-17 NOTE — [facility] Counselor (Addendum)
 RNCM left HIPAA compliant voice mail on patient's sister's phone to follow up on placement. Awaiting return call.

## 2024-08-17 NOTE — Plan of Care (Signed)

## 2024-08-17 NOTE — Progress Notes (Signed)
" °   08/17/24 1400  Psych Admission Type (Psych Patients Only)  Admission Status Involuntary  Psychosocial Assessment  Patient Complaints Anxiety;Hyperactivity;Worrying  Eye Contact Glaring  Facial Expression Anxious  Affect Labile  Speech Logical/coherent  Interaction Assertive  Motor Activity Slow  Appearance/Hygiene In scrubs  Behavior Characteristics Intrusive;Restless  Mood Preoccupied;Anxious  Thought Process  Coherency Disorganized  Content Delusions  Delusions Paranoid  Perception WDL  Hallucination None reported or observed  Judgment Limited  Confusion Mild  Danger to Self  Current suicidal ideation? Denies  Danger to Others  Danger to Others None reported or observed    "

## 2024-08-17 NOTE — Progress Notes (Signed)
" °   08/17/24 2000  Psych Admission Type (Psych Patients Only)  Admission Status Involuntary  Psychosocial Assessment  Patient Complaints None  Eye Contact Glaring;Staring  Facial Expression Animated  Affect Preoccupied  Speech Logical/coherent  Interaction Assertive  Motor Activity Slow  Appearance/Hygiene Unremarkable  Behavior Characteristics Cooperative  Mood Preoccupied;Pleasant  Aggressive Behavior  Effect No apparent injury  Thought Process  Coherency WDL  Content WDL  Delusions None reported or observed  Perception WDL  Hallucination None reported or observed  Judgment Limited  Confusion Mild  Danger to Self  Current suicidal ideation? Denies  Danger to Others  Danger to Others None reported or observed    "

## 2024-08-17 NOTE — Group Note (Signed)
 Date:  08/17/2024 Time:  3:33 PM  Group Topic/Focus:  Making Healthy Choices:   The focus of this group is to help patients identify negative/unhealthy choices they were using prior to admission and identify positive/healthier coping strategies to replace them upon discharge.    Participation Level:  Active  Participation Quality:  Appropriate  Affect:  Appropriate  Cognitive:  Appropriate  Insight: Appropriate  Engagement in Group:  Engaged  Modes of Intervention:  Discussion   Arland Nutting 08/17/2024, 3:33 PM

## 2024-08-17 NOTE — Progress Notes (Signed)
 [facility] MD Progress Note   12:33 PM Valerie [name]  MRN:  [id number]   Valerie [name] is a 64 year old female presenting voluntary to [facility] requesting a psychiatric evaluation for worsening psychosis. Patient has history of schizophrenia. Patient denied SI, HI, psychosis and alcohol/drug usage. Patient resides at [facility]. Patient reports people are coming by my door talking loud, yelling and screaming and coming in my room with keys. Patient reports having multiple medical Afibs and blood clots due to this. Patient reports people are doing things in the dining room. When asked if she called EMS, patient stated I called EMS, I stripped searched over the phone and called the authorities. Patient is admitted to [facility] unit with Q15 min safety monitoring. Multidisciplinary team approach is offered. Medication management; group/milieu therapy is offered.   Sister Kalyssa Anker : [phone]- called on 07/28/24  to update the med changes but went to generic voicemail  Subjective:  Chart reviewed, case discussed in multidisciplinary meeting, patient seen during rounds.  Patient is noted to be sitting in the day room and coloring.  She reports liking to do word searches and she completed 2 pages of it.  Patient is very good with coloring the pictures.  She remains focused on the director needing to come and interview her.  She reports that they are not going to show up she has alternate plan and she needs to go out.  Patient is encouraged to wait for the placement staff to come assess her.  She offers no other complaints.  She is not endorsing any SI/HI.  Per nursing patient is visible on the unit and is doing well.  Past Psychiatric History: see h&P Family History:  Family History  Problem Relation Age of Onset   Colon cancer Neg Hx    Celiac disease Neg Hx    Inflammatory bowel disease Neg Hx    Social History:  Social History   Substance and Sexual Activity   Alcohol Use Never     Social History   Substance and Sexual Activity  Drug Use Never    Social History   Socioeconomic History   Marital status: Single    Spouse name: Not on file   Number of children: Not on file   Years of education: Not on file   Highest education level: Not on file  Occupational History   Not on file  Tobacco Use   Smoking status: Never   Smokeless tobacco: Never  Substance and Sexual Activity   Alcohol use: Never   Drug use: Never   Sexual activity: Not Currently    Birth control/protection: Pill  Other Topics Concern   Not on file  Social History Narrative   Not on file   Social Drivers of Health   Tobacco Use: Low Risk (06/19/2024)   Patient History    Smoking Tobacco Use: Never    Smokeless Tobacco Use: Never    Passive Exposure: Not on file  Financial Resource Strain: Not on file  Food Insecurity: Unknown (06/19/2024)   Epic    Worried About Programme Researcher, Broadcasting/film/video in the Last Year: Never true    [employer] of Food in the Last Year: Patient declined  Transportation Needs: No Transportation Needs (06/19/2024)   Epic    Lack of Transportation (Medical): No    Lack of Transportation (Non-Medical): No  Physical Activity: Not on file  Stress: Not on file  Social Connections: Not on file  Depression (  PHQ2-9): Low Risk (12/21/2022)   Depression (PHQ2-9)    PHQ-2 Score: 0  Alcohol Screen: Low Risk (06/19/2024)   Alcohol Screen    Last Alcohol Screening Score (AUDIT): 0  Housing: Low Risk (06/19/2024)   Epic    Unable to Pay for Housing in the Last Year: No    Number of Times Moved in the Last Year: 0    Homeless in the Last Year: No  Utilities: Not At Risk (06/19/2024)   Epic    Threatened with loss of utilities: No  Health Literacy: Not on file   Past Medical History:  Past Medical History:  Diagnosis Date   Chronic lower extremity pain (2ry area of Pain) (Right) 12/21/2022   Chronic pain syndrome 12/20/2022   DDD (degenerative disc  disease), 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 Tobie: Pancolonic diverticulosis, tortuous colon, next colonoscopy 2024   TONSILLECTOMY      Current Medications: Current Facility-Administered Medications  Medication Dose Route Frequency Provider Last Rate Last Admin   acetaminophen  (TYLENOL ) tablet 650 mg  650 mg Oral Q6H PRN Onuoha, Chinwendu V, NP   650 mg at 08/14/24 1828   alum & mag hydroxide-simeth (MAALOX/MYLANTA) 200-200-20 MG/5ML suspension 30 mL  30 mL Oral Q8H PRN Ajibola, Ene A, NP   30 mL at 08/16/24 1436   ARIPiprazole  (ABILIFY ) tablet 5 mg  5 mg Oral Q breakfast Ninoshka Wainwright, MD   5 mg at 08/17/24 9071   aspirin  EC tablet 81 mg  81 mg Oral Daily Bobbitt, Shalon E, NP   81 mg at 08/17/24 9071   benztropine  (COGENTIN ) tablet 0.5 mg  0.5 mg Oral Daily PRN Jaevion Goto, MD       cholecalciferol  (VITAMIN D3) 25 MCG (1000 UNIT) tablet 1,000 Units  1,000 Units Oral Weekly Farooq Petrovich, MD   1,000 Units at 08/15/24 1223   cloZAPine  (CLOZARIL ) tablet 150 mg  150 mg Oral BID Adayah Arocho, MD   150 mg at 08/17/24 9071   dapagliflozin  propanediol (FARXIGA ) tablet 10 mg  10 mg Oral Daily Josette Ade, MD   10 mg at 08/17/24 9071   diclofenac  Sodium (VOLTAREN ) 1 % topical gel 4 g  4 g Topical QID PRN Madaram, Kondal R, MD       famotidine  (PEPCID ) tablet 40 mg  40 mg Oral Daily Shrivastava, Aryendra, MD   40 mg at 08/17/24 9071   fluticasone  (FLONASE ) 50 MCG/ACT nasal spray 1 spray  1 spray Each Nare Daily PRN Madaram, Kondal R, MD       furosemide  (LASIX ) tablet 40 mg  40 mg Oral 2 times per day Josette Ade, MD   40 mg at 08/17/24 9072   gabapentin  (NEURONTIN ) capsule 300 mg  300 mg Oral TID Bobbitt, Shalon E, NP   300 mg at 08/17/24 9071   hydrOXYzine  (ATARAX ) tablet 25 mg  25 mg Oral TID PRN Bobbitt, Shalon E, NP   25  mg at 08/16/24 2145   magnesium  hydroxide (MILK OF MAGNESIA) suspension 30 mL  30 mL Oral Daily PRN Bobbitt, Shalon E, NP   30 mL at 08/06/24 1047   metoprolol  succinate (TOPROL -XL) 24 hr tablet 25 mg  25 mg Oral BID Josette Ade, MD   25 mg at 08/17/24 9071   multivitamin with minerals tablet 1  tablet  1 tablet Oral Daily Bobbitt, Shalon E, NP   1 tablet at 08/17/24 9071   naproxen  (NAPROSYN ) tablet 375 mg  375 mg Oral TID WC Suraj Ramdass, MD   375 mg at 08/17/24 9071   OLANZapine  (ZYPREXA ) injection 5 mg  5 mg Intramuscular TID PRN Bobbitt, Shalon E, NP   5 mg at 06/22/24 2259   OLANZapine  zydis (ZYPREXA ) disintegrating tablet 5 mg  5 mg Oral TID PRN Bobbitt, Shalon E, NP   5 mg at 08/15/24 1034   ondansetron  (ZOFRAN ) tablet 4 mg  4 mg Oral Q8H PRN Bobbitt, Shalon E, NP   4 mg at 08/16/24 0518   Oxcarbazepine  (TRILEPTAL ) tablet 300 mg  300 mg Oral BID Javione Gunawan, MD   300 mg at 08/17/24 9071   potassium chloride  SA (KLOR-CON  M) CR tablet 20 mEq  20 mEq Oral BID Bobbitt, Shalon E, NP   20 mEq at 08/17/24 9072   rosuvastatin  (CRESTOR ) tablet 5 mg  5 mg Oral Daily Bobbitt, Shalon E, NP   5 mg at 08/17/24 0929   traZODone  (DESYREL ) tablet 50 mg  50 mg Oral QHS PRN Bobbitt, Shalon E, NP   50 mg at 08/13/24 2114   zolpidem  (AMBIEN ) tablet 10 mg  10 mg Oral QHS Charina Fons, MD   10 mg at 08/16/24 2145    Lab Results:  Results for orders placed or performed during the hospital encounter of 06/19/24 (from the past 48 hours)  CBC with Differential/Platelet     Status: Abnormal   Collection Time: 08/17/24  7:41 AM  Result Value Ref Range   WBC 7.0 4.0 - 10.5 K/uL   RBC 4.34 3.87 - 5.11 MIL/uL   Hemoglobin 11.6 (L) 12.0 - 15.0 g/dL   HCT 62.5 63.9 - 53.9 %   MCV 86.2 80.0 - 100.0 fL   MCH 26.7 26.0 - 34.0 pg   MCHC 31.0 30.0 - 36.0 g/dL   RDW 86.0 88.4 - 84.4 %   Platelets 323 150 - 400 K/uL   nRBC 0.0 0.0 - 0.2 %   Neutrophils Relative % 46 %   Neutro Abs 3.2 1.7 - 7.7 K/uL    Lymphocytes Relative 38 %   Lymphs Abs 2.7 0.7 - 4.0 K/uL   Monocytes Relative 10 %   Monocytes Absolute 0.7 0.1 - 1.0 K/uL   Eosinophils Relative 5 %   Eosinophils Absolute 0.3 0.0 - 0.5 K/uL   Basophils Relative 1 %   Basophils Absolute 0.1 0.0 - 0.1 K/uL   Immature Granulocytes 0 %   Abs Immature Granulocytes 0.03 0.00 - 0.07 K/uL    Comment: Performed at [facility], [address] [place], [place] [address]        Blood Alcohol level:  Lab Results  Component Value Date   [facility] <15 06/18/2024    Metabolic Disorder Labs: Lab Results  Component Value Date   HGBA1C 5.9 (H) 06/28/2024   MPG 122.63 06/28/2024   No results found for: PROLACTIN Lab Results  Component Value Date   CHOL 156 06/28/2024   TRIG 89 06/28/2024   HDL 70 06/28/2024   CHOLHDL 2.2 06/28/2024   VLDL 18 06/28/2024   LDLCALC 69 06/28/2024    Physical Findings: AIMS:  , ,  ,  ,    CIWA:    COWS:      Psychiatric Specialty Exam:  Presentation  General Appearance:  Casual  Eye Contact: Fleeting  Speech: Normal Rate  Speech Volume: Increased    Mood and Affect  Mood: Euphoric  Affect: stable  Thought Process  Thought Processes: Improving illogical  Orientation:Full (Time, Place and Person)  Thought Content:Illogical; Delusions; Tangential  Hallucinations: Denies  Ideas of Reference:Delusions  Suicidal Thoughts: Denies  Homicidal Thoughts: Denies   Sensorium  Memory: Immediate Fair; Remote Fair  Judgment: Impaired  Insight: Shallow   Executive Functions  Concentration: Poor  Attention Span: Poor  Recall: [employer] of Knowledge: Fair  Language: Fair   Psychomotor Activity  Psychomotor Activity: No data recorded  Musculoskeletal: Strength & Muscle Tone: within normal limits Gait & Station: normal Assets  Assets: Manufacturing Systems Engineer; Desire for Improvement    Physical Exam: Physical Exam Vitals and nursing note  reviewed.    ROS Blood pressure 125/65, pulse 69, temperature (!) 97.3 F (36.3 C), resp. rate 16, height 5' 5 (1.651 m), weight 87.3 kg, SpO2 100%. Body mass index is 32.03 kg/m.  Diagnosis: Principal Problem:   Bilateral lower extremity edema Active Problems:   Hypercholesteremia   Schizoaffective disorder, bipolar type (HCC)   Chest pain   Sinus tachycardia   Hypotension   Controlled type 2 diabetes mellitus without complication, without long-term current use of insulin (HCC)   Hyponatremia  Schizoaffective disorder bipolar type  Clinical Decision Making: Patient is currently admitted for worsening psychosis, manic symptoms, tangential grandiosity.  She needs inpatient hospitalization for medication management and close monitoring.  Patient is unable to make decisions on her own as she is unable to appreciate and understand her mental health history, the current need for treatment.  [facility] has screened her case and for further evaluation for legal guardianship. [facility] screened in her case. Sister and mom are involved in her care.  Treatment Plan Summary:  Patient is not responding at all to Clozaril  in spite of increased dose and also poor response to Geodon  which was discontinued and started on Abilify  which was titrated to 20 mg today.  If patient remains manic/psychotic we will discuss a plan to switch Clozaril  to loxapine as patient has documented allergy to Risperdal/paliperidone. Safety and Monitoring:             -- inoluntary admission to inpatient psychiatric unit for safety, stabilization and treatment             -- Daily contact with patient to assess and evaluate symptoms and progress in treatment             -- Patient's case to be discussed in multi-disciplinary team meeting             -- Observation Level: q15 minute checks             -- Vital signs:  q12 hours             -- Precautions: suicide, elopement, and assault   2. Psychiatric Diagnoses and Treatment:   Discontinue Depakote  as patient is noncompliant Discontinue Geodon  20 mg nightly as poor response Clozaril  150mg  twice daily  Cymbalta  40 mg daily-discontinue to minimize manic Trilepta 300 mg twice daily  LAI Abilify  Maintena- given 08/04/24 -- The risks/benefits/side-effects/alternatives to this medication were discussed in detail with the patient and time was given for questions. The patient consents to medication trial.                -- Metabolic profile and EKG monitoring obtained while on an atypical antipsychotic (BMI: Lipid Panel: HbgA1c: QTc:)              --  Encouraged patient to participate in unit milieu and in scheduled group therapies                            3. Medical Issues Being Addressed:   Chest pain Sinus tachycardia HTN (hypertension) Bilateral lower extremity edema Hypercholesteremia  12/30:  Troponin negative x 2. Echocardiogram done with pending results.  Small improvement in lower extremity edema so continuing p.o. Lasix .  Will need BMP every other day.  4. Discharge Planning:   -- Social work and case management to assist with discharge planning and identification of hospital follow-up needs prior to discharge  -- Estimated LOS: 3-4 days  Nuala Chiles, MD 08/17/2024, 12:33 PM

## 2024-08-17 NOTE — Group Note (Signed)
 Recreation Therapy Group Note   Group Topic:Animal Assisted Therapy   Group Date: 08/17/2024 Start Time: 1015 End Time: 1040 Facilitators: Celestia Jeoffrey BRAVO, LRT, CTRS Location: Dayroom  Group Description: AAA. Animal-Assisted Activity provides opportunities for motivational, educational, therapeutic and/or recreational benefits to enhance quality of life. [name] and [name] visited the unit to interact with patients.   Goal Areas Addressed:  Reduced anxiety and stress Improved mood Increased social interaction Enhanced communication skills Reduced loneliness and isolation Improved emotional regulation   Affect/Mood: Full range   Participation Level: Active and Engaged   Participation Quality: Independent   Behavior: Alert and Cooperative   Speech/Thought Process: Loose association   Insight: Fair   Judgement: Limited   Modes of Intervention: Activity   Patient Response to Interventions:  Receptive   Education Outcome:  In group clarification offered    Clinical Observations/Individualized Feedback: Valerie [name] was active in their participation of session activities and group discussion. Pt interacted well with LRT and peers duration of session.    Plan: Continue to engage patient in RT group sessions 2-3x/week.   Jeoffrey BRAVO Celestia, LRT, CTRS 08/17/2024 11:25 AM

## 2024-08-18 MED ORDER — ARIPIPRAZOLE 5 MG PO TABS: 30.0000 mg | ORAL_TABLET | Freq: Every day | ORAL | Status: AC

## 2024-08-18 NOTE — Group Note (Signed)
 Date:  08/18/2024 Time:  11:37 AM  Group Topic/Focus:  Making Healthy Choices:   The focus of this group is to help patients identify negative/unhealthy choices they were using prior to admission and identify positive/healthier coping strategies to replace them upon discharge.    Participation Level:  Active  Participation Quality:  Appropriate  Affect:  Appropriate  Cognitive:  Appropriate  Insight: Appropriate  Engagement in Group:  Engaged  Modes of Intervention:  Discussion   Valerie [name] 08/18/2024, 11:37 AM

## 2024-08-18 NOTE — Progress Notes (Addendum)
" °   08/17/24 1345  Spiritual Encounters  Type of Visit Follow up  Care provided to: Patient  Referral source Patient request  Reason for visit Religious ritual  OnCall Visit No  Interventions  Spiritual Care Interventions Made Prayer   Prior to group, Corean requested a visit for prayer. She also mentioned hope around possible placement and awaiting visit from director.  I provided prayer at her request and words of encouragement. Relational support through noticing nature and the snow, reflecting on the season, birds, and spring coming.  Dorianna Mckiver L. Delores HERO.Div "

## 2024-08-18 NOTE — [facility] Counselor (Signed)
 RNCM called pt's family, Chayil Gantt [phone]) to follow up regarding placement proceeding patient's discharge. Joy relayed having spoke to the director of [facility] assisted living in [place], and was told they would call her back to schedule a day and time to come to interview Staphanie regarding placement at their facility.  Pt's family will keep us  updated regarding on the status of interview and placement.

## 2024-08-18 NOTE — Group Note (Signed)
 Date:  08/18/2024 Time:  9:32 PM  Group Topic/Focus:  Overcoming Stress:   The focus of this group is to define stress and help patients assess their triggers.    Participation Level:  Active  Participation Quality:  Appropriate  Affect:  Appropriate  Cognitive:  Appropriate  Insight: Good  Engagement in Group:  Engaged  Modes of Intervention:  Discussion  Additional Comments:    Valerie [name] 08/18/2024, 9:32 PM

## 2024-08-18 NOTE — Group Note (Signed)
 Recreation Therapy Group Note   Group Topic:Emotion Expression  Group Date: 08/18/2024 Start Time: 1400 End Time: 1450 Facilitators: Celestia Jeoffrey BRAVO, LRT, CTRS Location: Dayroom  Group Description: Patients will engage in a guided art activity focused on painting a personal peaceful place. This group encourages relaxation, self-expression, and mindfulness through creative exploration. Participants are invited to imagine a location where they feel calm, safe, or content and represent it using paint and other art materials. Emphasis is placed on the process rather than artistic skill, allowing individuals to express emotions, reduce stress, and increase self-awareness in a supportive group setting.  Goal Area(s): Promote relaxation and stress reduction Enhance emotional expression and self-awareness Support coping skills and grounding techniques Encourage creativity and positive leisure engagement [place] interaction and group participation   Affect/Mood: Full range   Participation Level: Minimal    Clinical Observations/Individualized Feedback: Sejla was originally present in group. Pt painted the words peace and love on her canvas. Pt shared that she brain is foggy and I don't know where I am at. Where am I? Pt was reassured by LRT and pt was receptive. Pt left group and did not return.   Plan: Continue to engage patient in RT group sessions 2-3x/week.   Jeoffrey BRAVO Celestia, LRT, CTRS 08/18/2024 4:43 PM

## 2024-08-18 NOTE — Progress Notes (Signed)
 [facility] MD Progress Note   1:05 PM Rola Lennon  MRN:  [id number]   Valerie [name] is a 64 year old female presenting voluntary to [facility] requesting a psychiatric evaluation for worsening psychosis. Patient has history of schizophrenia. Patient denied SI, HI, psychosis and alcohol/drug usage. Patient resides at [facility]. Patient reports people are coming by my door talking loud, yelling and screaming and coming in my room with keys. Patient reports having multiple medical Afibs and blood clots due to this. Patient reports people are doing things in the dining room. When asked if she called EMS, patient stated I called EMS, I stripped searched over the phone and called the authorities. Patient is admitted to [facility] unit with Q15 min safety monitoring. Multidisciplinary team approach is offered. Medication management; group/milieu therapy is offered.   Sister Denicia Pagliarulo : [phone]- called on 07/28/24  to update the med changes but went to generic voicemail  Subjective:  Chart reviewed, case discussed in multidisciplinary meeting, patient seen during rounds.   Patient is noted to be walking in the hallway.  She has been noticed to be pacing more today.  She remains focused on discharge and calls provider multiple times to discuss discharge planning.  She reports being frustrated about being in the hospital and the need for her to leave the hospital.  She reports that she can find another place.  She denies hallucinations but remains delusional about not feeling safe on the unit but is unable to give details.  Per nursing she has been more pacing today.  She took her medications.  She denies SI/HI/plan. Past Psychiatric History: see h&P Family History:  Family History  Problem Relation Age of Onset   Colon cancer Neg Hx    Celiac disease Neg Hx    Inflammatory bowel disease Neg Hx    Social History:  Social History   Substance and Sexual Activity  Alcohol  Use Never     Social History   Substance and Sexual Activity  Drug Use Never    Social History   Socioeconomic History   Marital status: Single    Spouse name: Not on file   Number of children: Not on file   Years of education: Not on file   Highest education level: Not on file  Occupational History   Not on file  Tobacco Use   Smoking status: Never   Smokeless tobacco: Never  Substance and Sexual Activity   Alcohol use: Never   Drug use: Never   Sexual activity: Not Currently    Birth control/protection: Pill  Other Topics Concern   Not on file  Social History Narrative   Not on file   Social Drivers of Health   Tobacco Use: Low Risk (06/19/2024)   Patient History    Smoking Tobacco Use: Never    Smokeless Tobacco Use: Never    Passive Exposure: Not on file  Financial Resource Strain: Not on file  Food Insecurity: Unknown (06/19/2024)   Epic    Worried About Programme Researcher, Broadcasting/film/video in the Last Year: Never true    [employer] of Food in the Last Year: Patient declined  Transportation Needs: No Transportation Needs (06/19/2024)   Epic    Lack of Transportation (Medical): No    Lack of Transportation (Non-Medical): No  Physical Activity: Not on file  Stress: Not on file  Social Connections: Not on file  Depression (PHQ2-9): Low Risk (12/21/2022)   Depression (PHQ2-9)  PHQ-2 Score: 0  Alcohol Screen: Low Risk (06/19/2024)   Alcohol Screen    Last Alcohol Screening Score (AUDIT): 0  Housing: Low Risk (06/19/2024)   Epic    Unable to Pay for Housing in the Last Year: No    Number of Times Moved in the Last Year: 0    Homeless in the Last Year: No  Utilities: Not At Risk (06/19/2024)   Epic    Threatened with loss of utilities: No  Health Literacy: Not on file   Past Medical History:  Past Medical History:  Diagnosis Date   Chronic lower extremity pain (2ry area of Pain) (Right) 12/21/2022   Chronic pain syndrome 12/20/2022   DDD (degenerative disc disease),  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 Tobie: Pancolonic diverticulosis, tortuous colon, next colonoscopy 2024   TONSILLECTOMY      Current Medications: Current Facility-Administered Medications  Medication Dose Route Frequency Provider Last Rate Last Admin   acetaminophen  (TYLENOL ) tablet 650 mg  650 mg Oral Q6H PRN Onuoha, Chinwendu V, NP   650 mg at 08/14/24 1828   alum & mag hydroxide-simeth (MAALOX/MYLANTA) 200-200-20 MG/5ML suspension 30 mL  30 mL Oral Q8H PRN Ajibola, Ene A, NP   30 mL at 08/16/24 1436   ARIPiprazole  (ABILIFY ) tablet 5 mg  5 mg Oral Q breakfast Dontavius Keim, MD   5 mg at 08/18/24 0957   aspirin  EC tablet 81 mg  81 mg Oral Daily Bobbitt, Shalon E, NP   81 mg at 08/18/24 1000   benztropine  (COGENTIN ) tablet 0.5 mg  0.5 mg Oral Daily PRN Alexiana Laverdure, MD       cholecalciferol  (VITAMIN D3) 25 MCG (1000 UNIT) tablet 1,000 Units  1,000 Units Oral Weekly Elliyah Liszewski, MD   1,000 Units at 08/15/24 1223   cloZAPine  (CLOZARIL ) tablet 150 mg  150 mg Oral BID Aveah Castell, MD   150 mg at 08/18/24 1000   dapagliflozin  propanediol (FARXIGA ) tablet 10 mg  10 mg Oral Daily Josette Ade, MD   10 mg at 08/18/24 1000   diclofenac  Sodium (VOLTAREN ) 1 % topical gel 4 g  4 g Topical QID PRN Madaram, Kondal R, MD       famotidine  (PEPCID ) tablet 40 mg  40 mg Oral Daily Shrivastava, Aryendra, MD   40 mg at 08/18/24 0957   fluticasone  (FLONASE ) 50 MCG/ACT nasal spray 1 spray  1 spray Each Nare Daily PRN Madaram, Kondal R, MD       furosemide  (LASIX ) tablet 40 mg  40 mg Oral 2 times per day Josette Ade, MD   40 mg at 08/18/24 9040   gabapentin  (NEURONTIN ) capsule 300 mg  300 mg Oral TID Bobbitt, Shalon E, NP   300 mg at 08/18/24 0957   hydrOXYzine  (ATARAX ) tablet 25 mg  25 mg Oral TID PRN Bobbitt, Shalon E, NP   25 mg at  08/16/24 2145   magnesium  hydroxide (MILK OF MAGNESIA) suspension 30 mL  30 mL Oral Daily PRN Bobbitt, Shalon E, NP   30 mL at 08/06/24 1047   metoprolol  succinate (TOPROL -XL) 24 hr tablet 25 mg  25 mg Oral BID Josette Ade, MD   25 mg at 08/17/24 2131   multivitamin with minerals tablet 1 tablet  1 tablet Oral Daily Bobbitt, Shalon E, NP  1 tablet at 08/18/24 0957   naproxen  (NAPROSYN ) tablet 375 mg  375 mg Oral TID WC Nyana Haren, MD   375 mg at 08/18/24 1000   OLANZapine  (ZYPREXA ) injection 5 mg  5 mg Intramuscular TID PRN Bobbitt, Shalon E, NP   5 mg at 06/22/24 2259   OLANZapine  zydis (ZYPREXA ) disintegrating tablet 5 mg  5 mg Oral TID PRN Bobbitt, Shalon E, NP   5 mg at 08/15/24 1034   ondansetron  (ZOFRAN ) tablet 4 mg  4 mg Oral Q8H PRN Bobbitt, Shalon E, NP   4 mg at 08/18/24 0552   Oxcarbazepine  (TRILEPTAL ) tablet 300 mg  300 mg Oral BID Keola Heninger, MD   300 mg at 08/18/24 1000   potassium chloride  SA (KLOR-CON  M) CR tablet 20 mEq  20 mEq Oral BID Bobbitt, Shalon E, NP   20 mEq at 08/18/24 0957   rosuvastatin  (CRESTOR ) tablet 5 mg  5 mg Oral Daily Bobbitt, Shalon E, NP   5 mg at 08/18/24 0959   traZODone  (DESYREL ) tablet 50 mg  50 mg Oral QHS PRN Bobbitt, Shalon E, NP   50 mg at 08/13/24 2114   zolpidem  (AMBIEN ) tablet 10 mg  10 mg Oral QHS Surabhi Gadea, MD   10 mg at 08/17/24 2131    Lab Results:  Results for orders placed or performed during the hospital encounter of 06/19/24 (from the past 48 hours)  CBC with Differential/Platelet     Status: Abnormal   Collection Time: 08/17/24  7:41 AM  Result Value Ref Range   WBC 7.0 4.0 - 10.5 K/uL   RBC 4.34 3.87 - 5.11 MIL/uL   Hemoglobin 11.6 (L) 12.0 - 15.0 g/dL   HCT 62.5 63.9 - 53.9 %   MCV 86.2 80.0 - 100.0 fL   MCH 26.7 26.0 - 34.0 pg   MCHC 31.0 30.0 - 36.0 g/dL   RDW 86.0 88.4 - 84.4 %   Platelets 323 150 - 400 K/uL   nRBC 0.0 0.0 - 0.2 %   Neutrophils Relative % 46 %   Neutro Abs 3.2 1.7 - 7.7 K/uL    Lymphocytes Relative 38 %   Lymphs Abs 2.7 0.7 - 4.0 K/uL   Monocytes Relative 10 %   Monocytes Absolute 0.7 0.1 - 1.0 K/uL   Eosinophils Relative 5 %   Eosinophils Absolute 0.3 0.0 - 0.5 K/uL   Basophils Relative 1 %   Basophils Absolute 0.1 0.0 - 0.1 K/uL   Immature Granulocytes 0 %   Abs Immature Granulocytes 0.03 0.00 - 0.07 K/uL    Comment: Performed at [facility], [address] [place], [place] [address]        Blood Alcohol level:  Lab Results  Component Value Date   [facility] <15 06/18/2024    Metabolic Disorder Labs: Lab Results  Component Value Date   HGBA1C 5.9 (H) 06/28/2024   MPG 122.63 06/28/2024   No results found for: PROLACTIN Lab Results  Component Value Date   CHOL 156 06/28/2024   TRIG 89 06/28/2024   HDL 70 06/28/2024   CHOLHDL 2.2 06/28/2024   VLDL 18 06/28/2024   LDLCALC 69 06/28/2024    Physical Findings: AIMS:  , ,  ,  ,    CIWA:    COWS:      Psychiatric Specialty Exam:  Presentation  General Appearance:  Casual  Eye Contact: Fleeting  Speech: Normal Rate  Speech Volume: Increased    Mood and Affect  Mood:  Euphoric  Affect: stable  Thought Process  Thought Processes: Improving illogical  Orientation:Full (Time, Place and Person)  Thought Content:Illogical; Delusions; Tangential  Hallucinations: Denies  Ideas of Reference:Delusions  Suicidal Thoughts: Denies  Homicidal Thoughts: Denies   Sensorium  Memory: Immediate Fair; Remote Fair  Judgment: Impaired  Insight: Shallow   Executive Functions  Concentration: Poor  Attention Span: Poor  Recall: [employer] of Knowledge: Fair  Language: Fair   Psychomotor Activity  Psychomotor Activity: No data recorded  Musculoskeletal: Strength & Muscle Tone: within normal limits Gait & Station: normal Assets  Assets: Manufacturing Systems Engineer; Desire for Improvement    Physical Exam: Physical Exam Vitals and nursing note  reviewed.    ROS Blood pressure (!) 106/56, pulse 88, temperature (!) 97.3 F (36.3 C), resp. rate 14, height 5' 5 (1.651 m), weight 87.3 kg, SpO2 100%. Body mass index is 32.03 kg/m.  Diagnosis: Principal Problem:   Bilateral lower extremity edema Active Problems:   Hypercholesteremia   Schizoaffective disorder, bipolar type (HCC)   Chest pain   Sinus tachycardia   Hypotension   Controlled type 2 diabetes mellitus without complication, without long-term current use of insulin (HCC)   Hyponatremia  Schizoaffective disorder bipolar type  Clinical Decision Making:   Patient is unable to make decisions on her own as she is unable to appreciate and understand her mental health history, the current need for treatment.  [facility] has screened her case and for further evaluation for legal guardianship. Sister and mom are involved in her care and sister submitted for legal guardianship.  Treatment Plan Summary:  Patient is not responding at all to Clozaril  in spite of increased dose and also poor response to Geodon  which was discontinued and started on Abilify  which was titrated to 30 mg. Safety and Monitoring:             -- inoluntary admission to inpatient psychiatric unit for safety, stabilization and treatment             -- Daily contact with patient to assess and evaluate symptoms and progress in treatment             -- Patient's case to be discussed in multi-disciplinary team meeting             -- Observation Level: q15 minute checks             -- Vital signs:  q12 hours             -- Precautions: suicide, elopement, and assault   2. Psychiatric Diagnoses and Treatment:  Discontinue Depakote  as patient is noncompliant Discontinue Geodon  20 mg nightly as poor response Clozaril  150mg  twice daily  Cymbalta  40 mg daily-discontinue to minimize manic Trilepta 300 mg twice daily Abilify  30 mg daily-tried to titrate down the oral Abilify  but patient became increasingly paranoid and spite  of Abilify  maintainer added.  For now we will continue Abilify  30 mg.  LAI Abilify  Maintena- given 08/04/24 -- The risks/benefits/side-effects/alternatives to this medication were discussed in detail with the patient and time was given for questions. The patient consents to medication trial.                -- Metabolic profile and EKG monitoring obtained while on an atypical antipsychotic (BMI: Lipid Panel: HbgA1c: QTc:)              -- Encouraged patient to participate in unit milieu and in scheduled group therapies  3. Medical Issues Being Addressed:   Chest pain Sinus tachycardia HTN (hypertension) Bilateral lower extremity edema Hypercholesteremia  12/30:  Troponin negative x 2. Echocardiogram done with pending results.  Small improvement in lower extremity edema so continuing p.o. Lasix .  Will need BMP every other day.  4. Discharge Planning:   -- Social work and case management to assist with discharge planning and identification of hospital follow-up needs prior to discharge  -- Estimated LOS: 3-4 days  Dmya Long, MD 08/18/2024, 1:05 PM

## 2024-08-18 NOTE — Plan of Care (Signed)
  Problem: Coping: Goal: Ability to verbalize frustrations and anger appropriately will improve Outcome: Progressing Goal: Ability to demonstrate self-control will improve Outcome: Progressing   Problem: Health Behavior/Discharge Planning: Goal: Identification of resources available to assist in meeting health care needs will improve Outcome: Progressing Goal: Compliance with treatment plan for underlying cause of condition will improve Outcome: Progressing

## 2024-08-18 NOTE — Group Note (Signed)
 Physical/Occupational Therapy Group Note  Group Topic: UE Therex   Group Date: 08/18/2024 Start Time: 1305 End Time: 1335 Facilitators: Clive Warren CROME, OT   Group Description: Group instructed in series of upper extremities exercises, aimed to promote strength, flexibility, range of motion and functional endurance.  Patients provided cuing for proper mechanics and proper pace of exercise; exercises adjusted as necessary for individualized patient needs.  Patient also engaged in cognitive components throughout session, working to integrate attention to task, command following, turn-taking and appropriate social interaction throughout session.  Allowed to ask questions as appropriate, and encouraged to identify specific exercises that they could complete independently outside of group sessions.  Therapeutic Goal(s):  Demonstrate appropriate performance of upper extremity exercises to promote strength, flexibility, range of motion and functional endurance Identify 2-3 specific upper extremity exercises to complete as home exercise program outside of group session  Individual Participation: Pt initially eager to participate. She required minimal multimodal cues for technique, engaged in group discussion with prompting. Intermittently announcing she felt cursed. Instructed her in pursed lip breathing and pt able to return demo but later pt excused herself near the end of the session to lay down and rest. Much less disruptive to the group this session as compared to previous sessions with this facilitator.   Participation Level: Engaged   Participation Quality: Minimal Cues   Behavior: Alert, Appropriate, Distracted, and Preoccupied   Speech/Thought Process: Distracted   Affect/Mood: Anxious and Depressed   Insight: Fair   Judgement: Fair   Modes of Intervention: Activity, Clarification, Discussion, Education, Exploration, Problem-solving, Rapport Building, Socialization, and Support   Patient Response to Interventions:  Interested  and Receptive   Plan: Continue to engage patient in PT/OT groups 1 - 2x/week.  Valerie Claw R., MPH, Valerie [name], Valerie [name] ascom [phone] 08/18/24, 2:29 PM

## 2024-08-18 NOTE — Plan of Care (Signed)
   Problem: Coping: Goal: Ability to verbalize frustrations and anger appropriately will improve Outcome: Progressing Goal: Ability to demonstrate self-control will improve Outcome: Progressing

## 2024-08-18 NOTE — Progress Notes (Signed)
 SI/HI/AVH: denies all  Behavior/Mood: restless, appropriate to situation/preoccupied, pleasant    Interaction/Group attendance: assertive/ 3 of 3 groups   Medication/PRNs: compliant/ none   Pain: denies  Other: potential placement found with a [facility].

## 2024-08-18 NOTE — [facility] Group Notes (Signed)
 Spirituality Group   Description: Participant directed exploration of values, beliefs and meaning   **Focus on Gratitude: Invite reflection on sources of gratitude (external/internal); goal to invite internal gratitude to foster 1) reconnection with life-giving activities 2) self-compassion.   Following a brief framework of chaplains role and ground rules of group behavior, participants are invited to share concerns or questions that engage spiritual life. Emphasis placed on common themes and shared experiences and ways to make meaning and clarify living into ones values.   Theory/Process/Goal: Utilize the theoretical framework of group therapy established by Celena Kite, Relational Cultural Theory and Rogerian approaches to facilitate relational empathy and use of the here and now to foster reflection, self-awareness, and sharing.   Observations: Sat apart, unable to participate in the group discussion. Fewer disruptive outbursts but still occurs. This chaplain continued to invite participation as able.  Leanna Hamid L. Delores HERO.Div
